# Patient Record
Sex: Male | Born: 1975
Health system: Southern US, Community
[De-identification: ages and names within clinical notes are randomized; demographics above are authoritative.]

## PROBLEM LIST (undated history)

## (undated) ENCOUNTER — Emergency Department (HOSPITAL_COMMUNITY): Admission: EM | Payer: Self-pay

## (undated) DIAGNOSIS — Z8672 Personal history of thrombophlebitis: Secondary | ICD-10-CM

## (undated) DIAGNOSIS — R112 Nausea with vomiting, unspecified: Secondary | ICD-10-CM

## (undated) DIAGNOSIS — K219 Gastro-esophageal reflux disease without esophagitis: Secondary | ICD-10-CM

## (undated) DIAGNOSIS — E669 Obesity, unspecified: Secondary | ICD-10-CM

## (undated) DIAGNOSIS — Z9884 Bariatric surgery status: Secondary | ICD-10-CM

## (undated) DIAGNOSIS — M7989 Other specified soft tissue disorders: Secondary | ICD-10-CM

## (undated) DIAGNOSIS — E7849 Other hyperlipidemia: Secondary | ICD-10-CM

## (undated) DIAGNOSIS — Z86718 Personal history of other venous thrombosis and embolism: Secondary | ICD-10-CM

## (undated) DIAGNOSIS — G4730 Sleep apnea, unspecified: Secondary | ICD-10-CM

## (undated) DIAGNOSIS — Z720 Tobacco use: Secondary | ICD-10-CM

## (undated) DIAGNOSIS — I10 Essential (primary) hypertension: Secondary | ICD-10-CM

## (undated) DIAGNOSIS — Z9889 Other specified postprocedural states: Secondary | ICD-10-CM

## (undated) DIAGNOSIS — Z86711 Personal history of pulmonary embolism: Secondary | ICD-10-CM

## (undated) HISTORY — PX: HIP SURGERY: SHX245

## (undated) HISTORY — PX: PELVIS CLOSED REDUCTION: SHX1023

## (undated) HISTORY — DX: Personal history of other venous thrombosis and embolism: Z86.718

## (undated) HISTORY — DX: Other hyperlipidemia: E78.49

## (undated) HISTORY — DX: Other specified soft tissue disorders: M79.89

---

## 1993-10-15 HISTORY — PX: FRACTURE SURGERY: SHX138

## 1999-10-07 ENCOUNTER — Encounter: Payer: Self-pay | Admitting: Emergency Medicine

## 1999-10-07 ENCOUNTER — Emergency Department (HOSPITAL_COMMUNITY): Admission: EM | Admit: 1999-10-07 | Discharge: 1999-10-07 | Payer: Self-pay | Admitting: Emergency Medicine

## 2000-03-12 ENCOUNTER — Emergency Department (HOSPITAL_COMMUNITY): Admission: EM | Admit: 2000-03-12 | Discharge: 2000-03-12 | Payer: Self-pay

## 2006-10-06 ENCOUNTER — Emergency Department (HOSPITAL_COMMUNITY): Admission: EM | Admit: 2006-10-06 | Discharge: 2006-10-07 | Payer: Self-pay | Admitting: Emergency Medicine

## 2007-02-12 ENCOUNTER — Emergency Department (HOSPITAL_COMMUNITY): Admission: EM | Admit: 2007-02-12 | Discharge: 2007-02-12 | Payer: Self-pay | Admitting: Emergency Medicine

## 2011-12-20 ENCOUNTER — Observation Stay (HOSPITAL_COMMUNITY)
Admission: EM | Admit: 2011-12-20 | Discharge: 2011-12-21 | Disposition: A | Discharge status: Home Health | Payer: Self-pay | Attending: Emergency Medicine | Admitting: Emergency Medicine

## 2011-12-20 ENCOUNTER — Encounter (HOSPITAL_COMMUNITY): Payer: Self-pay | Admitting: Emergency Medicine

## 2011-12-20 DIAGNOSIS — E669 Obesity, unspecified: Secondary | ICD-10-CM | POA: Insufficient documentation

## 2011-12-20 DIAGNOSIS — L02419 Cutaneous abscess of limb, unspecified: Principal | ICD-10-CM | POA: Insufficient documentation

## 2011-12-20 DIAGNOSIS — L03119 Cellulitis of unspecified part of limb: Secondary | ICD-10-CM | POA: Insufficient documentation

## 2011-12-20 DIAGNOSIS — I10 Essential (primary) hypertension: Secondary | ICD-10-CM | POA: Insufficient documentation

## 2011-12-20 DIAGNOSIS — M7989 Other specified soft tissue disorders: Secondary | ICD-10-CM | POA: Insufficient documentation

## 2011-12-20 DIAGNOSIS — F17200 Nicotine dependence, unspecified, uncomplicated: Secondary | ICD-10-CM | POA: Insufficient documentation

## 2011-12-20 DIAGNOSIS — L0390 Cellulitis, unspecified: Secondary | ICD-10-CM

## 2011-12-20 HISTORY — DX: Essential (primary) hypertension: I10

## 2011-12-20 HISTORY — DX: Obesity, unspecified: E66.9

## 2011-12-20 LAB — CBC
HCT: 42.4 % (ref 39.0–52.0)
Hemoglobin: 15 g/dL (ref 13.0–17.0)
MCH: 30.2 pg (ref 26.0–34.0)
MCHC: 35.4 g/dL (ref 30.0–36.0)
MCV: 85.3 fL (ref 78.0–100.0)
Platelets: 274 10*3/uL (ref 150–400)
RBC: 4.97 MIL/uL (ref 4.22–5.81)
RDW: 13.6 % (ref 11.5–15.5)
WBC: 25.6 10*3/uL — ABNORMAL HIGH (ref 4.0–10.5)

## 2011-12-20 LAB — DIFFERENTIAL
Basophils Absolute: 0 10*3/uL (ref 0.0–0.1)
Basophils Relative: 0 % (ref 0–1)
Eosinophils Absolute: 0 10*3/uL (ref 0.0–0.7)
Eosinophils Relative: 0 % (ref 0–5)
Lymphocytes Relative: 13 % (ref 12–46)
Lymphs Abs: 3.3 10*3/uL (ref 0.7–4.0)
Monocytes Absolute: 1.3 10*3/uL — ABNORMAL HIGH (ref 0.1–1.0)
Monocytes Relative: 5 % (ref 3–12)
Neutro Abs: 21 10*3/uL — ABNORMAL HIGH (ref 1.7–7.7)
Neutrophils Relative %: 82 % — ABNORMAL HIGH (ref 43–77)
Smear Review: ADEQUATE

## 2011-12-20 LAB — BASIC METABOLIC PANEL
BUN: 12 mg/dL (ref 6–23)
CO2: 22 mEq/L (ref 19–32)
Calcium: 10.1 mg/dL (ref 8.4–10.5)
Chloride: 103 mEq/L (ref 96–112)
Creatinine, Ser: 0.81 mg/dL (ref 0.50–1.35)
GFR calc Af Amer: >90 mL/min (ref >90)
GFR calc non Af Amer: >90 mL/min (ref >90)
Glucose, Bld: 101 mg/dL — ABNORMAL HIGH (ref 70–99)
Potassium: 4.1 mEq/L (ref 3.5–5.1)
Sodium: 135 mEq/L (ref 135–145)

## 2011-12-20 NOTE — ED Notes (Signed)
PT. REPORTS WOKE UP THIS MORNING WITH PROGRESSING LEFT LOWER LEG REDDNESS WITH SWELLING , STATES LOW GRADE FEVER / CHILLS, LEFT HEEL WOUND FOR 1 1/2 WEEKS.

## 2011-12-21 MED ORDER — SODIUM CHLORIDE 0.9 % IV SOLN
1000.0000 mL | INTRAVENOUS | Status: DC
  Administered 2011-12-21 (×3): 1000 mL via INTRAVENOUS

## 2011-12-21 MED ORDER — OXYCODONE-ACETAMINOPHEN 5-325 MG PO TABS
1.0000 | ORAL_TABLET | Freq: Four times a day (QID) | ORAL | Status: AC | PRN
Start: 2011-12-21 — End: 2011-12-31

## 2011-12-21 MED ORDER — MORPHINE SULFATE 4 MG/ML IJ SOLN
4.0000 mg | INTRAMUSCULAR | Status: DC | PRN
  Administered 2011-12-21 (×2): 4 mg via INTRAVENOUS
  Filled 2011-12-21 (×2): qty 1

## 2011-12-21 MED ORDER — ACETAMINOPHEN 325 MG PO TABS: 650.0000 mg | ORAL_TABLET | ORAL | Status: DC | PRN

## 2011-12-21 MED ORDER — CLINDAMYCIN HCL 150 MG PO CAPS: 450.0000 mg | ORAL_CAPSULE | Freq: Three times a day (TID) | ORAL | Status: AC

## 2011-12-21 MED ORDER — CLINDAMYCIN PHOSPHATE 600 MG/50ML IV SOLN
600.0000 mg | Freq: Four times a day (QID) | INTRAVENOUS | Status: DC
  Administered 2011-12-21 (×2): 600 mg via INTRAVENOUS
  Filled 2011-12-21 (×2): qty 50

## 2011-12-21 MED ORDER — CLINDAMYCIN PHOSPHATE 600 MG/50ML IV SOLN
600.0000 mg | Freq: Once | INTRAVENOUS | Status: AC
  Administered 2011-12-21: 600 mg via INTRAVENOUS
  Filled 2011-12-21: qty 50

## 2011-12-21 MED ORDER — ONDANSETRON HCL 4 MG/2ML IJ SOLN
4.0000 mg | Freq: Four times a day (QID) | INTRAMUSCULAR | Status: DC | PRN
  Administered 2011-12-21 (×2): 4 mg via INTRAVENOUS
  Filled 2011-12-21 (×2): qty 2

## 2011-12-21 NOTE — ED Provider Notes (Signed)
7:30 AM Reassessed patient.  Patient is on CDU protocol for Cellulitis.  Patient has been given two rounds of IV Clindamycin.  Patient reports that his pain is somewhat better.  The area of cellulitis had been marked with a skin marking pen prior to IV antibiotics.  The area of cellulitis has not extended beyond the marked area.  Patient is currently afebrile.  VSS.  Patient is requesting to be discharged.  Since the patient is afebrile, area of cellulitis is not worsening, and pain is controlled;  feel that patient can be discharged home with po antibiotics and recheck in 48 hours.  Patient is in agreement with the plan.  Pascal Lux [place], PA-C 12/21/11 1758

## 2011-12-21 NOTE — ED Notes (Signed)
Diet tray ordered 

## 2011-12-21 NOTE — ED Notes (Signed)
Left lower leg red and warm to touch.  Left heel with wound for the last 1 1/2  Weeks  No drainage noted from either area.

## 2011-12-21 NOTE — ED Provider Notes (Signed)
History     CSN: [id number]  Arrival date & time 12/20/11  2228   First MD Initiated Contact with Patient 12/21/11 [phone]      Chief Complaint  Patient presents with  . Leg Swelling    (Consider location/radiation/quality/duration/timing/severity/associated sxs/prior treatment) HPI Left calf pain swelling and redness. Started yesterday as a small area posterior calf. Over the last few days patient has had a worsening open wound on his left heel that he attributes to dry skin and a shoe rubbing the area. Earlier today he had a temperature of 102 with chills. No nausea vomiting or diarrhea. No abdominal pain. No headaches or sore throat. No sick contacts at home. Symptoms moderate in severity. Left calf and leg pain worse with palpation and walking and feels better with rest. Patient denies any history of diabetes or cellulitis in the past. No chest pain or shortness of breath or history of DVT.  Past Medical History  Diagnosis Date  . Hypertension   . Obesity     Past Surgical History  Procedure Date  . Hip surgery     No family history on file.  History  Substance Use Topics  . Smoking status: Current Everyday Smoker  . Smokeless tobacco: Not on file  . Alcohol Use: Yes      Review of Systems  Constitutional: Negative for fever and chills.  HENT: Negative for neck pain and neck stiffness.   Eyes: Negative for pain.  Respiratory: Negative for shortness of breath.   Cardiovascular: Positive for leg swelling. Negative for chest pain and palpitations.  Gastrointestinal: Negative for abdominal pain.  Genitourinary: Negative for dysuria.  Musculoskeletal: Negative for back pain.  Skin: Positive for rash and wound.  Neurological: Negative for headaches.  All other systems reviewed and are negative.    Allergies  Review of patient's allergies indicates no known allergies.  Home Medications   Current Outpatient Rx  Name Route Sig Dispense Refill  . ACETAMINOPHEN 325  MG PO TABS Oral Take 650 mg by mouth every 6 (six) hours as needed. For pain/fever    . BENAZEPRIL HCL 20 MG PO TABS Oral Take 20 mg by mouth daily.      BP 111/68  Pulse 91  Temp(Src) 98.8 F (37.1 C) (Oral)  Resp 18  SpO2 95%  Physical Exam  Constitutional: He is oriented to person, place, and time. He appears well-developed and well-nourished.  HENT:  Head: Normocephalic and atraumatic.  Eyes: Conjunctivae and EOM are normal. Pupils are equal, round, and reactive to light.  Neck: Trachea normal. Neck supple. No thyromegaly present.  Cardiovascular: Normal rate, regular rhythm, S1 normal, S2 normal and normal pulses.     No systolic murmur is present   No diastolic murmur is present  Pulses:      Radial pulses are 2+ on the right side, and 2+ on the left side.  Pulmonary/Chest: Effort normal and breath sounds normal. He has no wheezes. He has no rhonchi. He has no rales. He exhibits no tenderness.  Abdominal: Soft. Normal appearance and bowel sounds are normal. There is no tenderness. There is no CVA tenderness and negative Murphy's sign.  Musculoskeletal:       Left lower extremity: Tenderness, erythema and swelling to left lateral and posterior calf. There is a healing wound or the left heel without fluctuance or active drainage. Distal neurovascular is intact. There are no cords negative Homans sign. Erythema edges outlined in ink.  Neurological: He is alert and  oriented to person, place, and time. He has normal strength. No cranial nerve deficit or sensory deficit. GCS eye subscore is 4. GCS verbal subscore is 5. GCS motor subscore is 6.  Skin: Skin is warm and dry. No rash noted. He is not diaphoretic.  Psychiatric: His speech is normal.       Cooperative and appropriate    ED Course  Procedures (including critical care time)  Results for orders placed during the hospital encounter of 12/20/11  CBC      Component Value Range   WBC 25.6 (*) 4.0 - 10.5 (K/uL)   RBC 4.97   4.22 - 5.81 (MIL/uL)   Hemoglobin 15.0  13.0 - 17.0 (g/dL)   HCT 13.0  86.5 - 78.4 (%)   MCV 85.3  78.0 - 100.0 (fL)   MCH 30.2  26.0 - 34.0 (pg)   MCHC 35.4  30.0 - 36.0 (g/dL)   RDW 69.6  29.5 - 28.4 (%)   Platelets 274  150 - 400 (K/uL)  DIFFERENTIAL      Component Value Range   Neutrophils Relative 82 (*) 43 - 77 (%)   Lymphocytes Relative 13  12 - 46 (%)   Monocytes Relative 5  3 - 12 (%)   Eosinophils Relative 0  0 - 5 (%)   Basophils Relative 0  0 - 1 (%)   Neutro Abs 21.0 (*) 1.7 - 7.7 (K/uL)   Lymphs Abs 3.3  0.7 - 4.0 (K/uL)   Monocytes Absolute 1.3 (*) 0.1 - 1.0 (K/uL)   Eosinophils Absolute 0.0  0.0 - 0.7 (K/uL)   Basophils Absolute 0.0  0.0 - 0.1 (K/uL)   Smear Review PLATELETS APPEAR ADEQUATE    BASIC METABOLIC PANEL      Component Value Range   Sodium 135  135 - 145 (mEq/L)   Potassium 4.1  3.5 - 5.1 (mEq/L)   Chloride 103  96 - 112 (mEq/L)   CO2 22  19 - 32 (mEq/L)   Glucose, Bld 101 (*) 70 - 99 (mg/dL)   BUN 12  6 - 23 (mg/dL)   Creatinine, Ser 1.32  0.50 - 1.35 (mg/dL)   Calcium 44.0  8.4 - 10.5 (mg/dL)   GFR calc non Af Amer >90  >90 (mL/min)   GFR calc Af Amer >90  >90 (mL/min)       MDM   Clinical cellulitis with fever elevated white blood cell count. Placed in CDU under observation status for IV antibiotics. Plan pain control and serial evaluations and recheck in a.m.        Sunnie Nielsen, MD 12/21/11 0110

## 2011-12-21 NOTE — Discharge Instructions (Signed)
Return to the Emergency Department if the area becomes worse and extends beyond the marked area.  Otherwise, return to the Emergency Department in 48 hours to have area rechecked. Take antibiotics as prescribed.  Take pain medication as prescribed.  Do not drive or operate heavy machinery while taking pain medication.

## 2011-12-22 NOTE — ED Provider Notes (Signed)
Medical screening examination/treatment/procedure(s) were performed by non-physician practitioner and as supervising physician I was immediately available for consultation/collaboration.   Gwyneth Sprout, MD 12/22/11 1550

## 2014-03-25 ENCOUNTER — Ambulatory Visit
Admission: RE | Admit: 2014-03-25 | Discharge: 2014-03-25 | Disposition: A | Discharge status: Home / Self Care | Payer: BC Managed Care – PPO | Source: Ambulatory Visit | Attending: Endocrinology | Admitting: Endocrinology

## 2014-03-25 ENCOUNTER — Other Ambulatory Visit: Payer: Self-pay | Admitting: Endocrinology

## 2014-03-25 DIAGNOSIS — G4489 Other headache syndrome: Secondary | ICD-10-CM

## 2014-07-18 ENCOUNTER — Ambulatory Visit (INDEPENDENT_AMBULATORY_CARE_PROVIDER_SITE_OTHER): Payer: BC Managed Care – PPO | Admitting: Internal Medicine

## 2014-07-18 VITALS — BP 128/74 | HR 100 | Temp 97.8°F | Resp 18 | Ht 74.0 in | Wt >= 6400 oz

## 2014-07-18 DIAGNOSIS — J988 Other specified respiratory disorders: Secondary | ICD-10-CM

## 2014-07-18 DIAGNOSIS — Z6841 Body Mass Index (BMI) 40.0 and over, adult: Secondary | ICD-10-CM | POA: Insufficient documentation

## 2014-07-18 MED ORDER — HYDROCODONE-HOMATROPINE 5-1.5 MG/5ML PO SYRP: 5.0000 mL | ORAL_SOLUTION | Freq: Four times a day (QID) | ORAL | Status: DC | PRN

## 2014-07-18 MED ORDER — PREDNISONE 20 MG PO TABS: ORAL_TABLET | ORAL | Status: DC

## 2014-07-18 NOTE — Progress Notes (Signed)
Subjective:    Patient ID: Joseph E Hoge, male    DOB: 07-22-1976, 38 y.o.   MRN: [id number] This chart was scribed for Robert P. Merla Richesoolittle, MD by Chestine SporeSoijett Blue, ED Scribe. The patient was seen in room 9 at 10:45 AM.   Chief Complaint  Patient presents with  . Cough    HPI Joseph Lang is a 38 y.o. male with medical hx of HTN and obesity who presents today complaining of cough. He states that he went to his doctor and was Rx a Z-pack. He states that no OTC medications work for him, and that the only thing that works is medications with codeine in them. He states that he is having trouble sleeping at night. He states that he has had associated symptoms of fever with the max being 99.3. He states that he has tried Delsym, Mucinex, and Robitussin with no relief for his symptoms. He denies any other associated symptoms. He states that he gets similar symptoms once every three years, but no underlying asthma    There are no active problems to display for this patient.  Past Medical History  Diagnosis Date  . Hypertension   . Obesity    Past Surgical History  Procedure Laterality Date  . Hip surgery     No Known Allergies Prior to Admission medications   Medication Sig Start Date End Date Taking? Authorizing Provider  azithromycin (ZITHROMAX) 1 G powder Take 1 g by mouth once.   Yes Historical Provider, MD  benazepril (LOTENSIN) 20 MG tablet Take 20 mg by mouth daily.   Yes Historical Provider, MD  acetaminophen (TYLENOL) 325 MG tablet Take 650 mg by mouth every 6 (six) hours as needed. For pain/fever    Historical Provider, MD     Review of Systems  Constitutional: Positive for fever.  Respiratory: Positive for cough and wheezing.        Objective:   Physical Exam  Nursing note and vitals reviewed. Constitutional: He is oriented to person, place, and time. He appears well-developed and well-nourished. No distress.  HENT:  Head: Normocephalic and atraumatic.  Eyes: EOM are  normal.  Neck: Neck supple. No tracheal deviation present.  Cardiovascular: Normal rate and regular rhythm.   Pulmonary/Chest: Effort normal. No respiratory distress. He has wheezes (bilaterally with expiration).  Musculoskeletal: Normal range of motion.  Neurological: He is alert and oriented to person, place, and time.  Skin: Skin is warm and dry.  Psychiatric: He has a normal mood and affect. His behavior is normal.         BP 128/74  Pulse 100  Temp(Src) 97.8 F (36.6 C) (Oral)  Resp 18  Ht 6\' 2"  (1.88 m)  Wt 434 lb (196.861 kg)  BMI 55.70 kg/m2  SpO2 97%  Assessment & Plan:  COORDINATION OF CARE: 10:51 AM-Discussed treatment plan which includes Hycodan and Prednisone with pt at bedside and pt agreed to plan.    I personally performed the services described in this documentation, which was scribed in my presence. The recorded information has been reviewed and is accurate.   Wheezing-associated respiratory infection  Meds ordered this encounter  Medications  . predniSONE (DELTASONE) 20 MG tablet    Sig: 4/3/3/2/2/1/1 single daily dose for 7 days    Dispense:  16 tablet    Refill:  0  . HYDROcodone-homatropine (HYCODAN) 5-1.5 MG/5ML syrup    Sig: Take 5 mLs by mouth every 6 (six) hours as needed.    Dispense:  120 mL    Refill:  0

## 2014-09-01 ENCOUNTER — Ambulatory Visit
Admission: RE | Admit: 2014-09-01 | Discharge: 2014-09-01 | Disposition: A | Discharge status: Home / Self Care | Payer: BC Managed Care – PPO | Source: Ambulatory Visit | Attending: Internal Medicine | Admitting: Internal Medicine

## 2014-09-01 ENCOUNTER — Other Ambulatory Visit: Payer: Self-pay | Admitting: Internal Medicine

## 2014-09-01 DIAGNOSIS — R05 Cough: Secondary | ICD-10-CM

## 2014-09-01 DIAGNOSIS — R059 Cough, unspecified: Secondary | ICD-10-CM

## 2014-10-15 ENCOUNTER — Emergency Department: Payer: Self-pay | Admitting: Emergency Medicine

## 2014-10-15 LAB — TROPONIN I: Troponin-I: 0.05 ng/mL

## 2014-10-15 LAB — BASIC METABOLIC PANEL
Anion Gap: 5 — ABNORMAL LOW (ref 7–16)
BUN: 11 mg/dL (ref 7–18)
Calcium, Total: 9.2 mg/dL (ref 8.5–10.1)
Chloride: 111 mmol/L — ABNORMAL HIGH (ref 98–107)
Co2: 25 mmol/L (ref 21–32)
Creatinine: 0.7 mg/dL (ref 0.60–1.30)
EGFR (African American): >60
EGFR (Non-African Amer.): >60
Glucose: 123 mg/dL — ABNORMAL HIGH (ref 65–99)
Osmolality: 282 (ref 275–301)
Potassium: 4 mmol/L (ref 3.5–5.1)
Sodium: 141 mmol/L (ref 136–145)

## 2014-10-16 ENCOUNTER — Encounter (HOSPITAL_COMMUNITY): Payer: Self-pay | Admitting: Emergency Medicine

## 2014-10-16 ENCOUNTER — Emergency Department (HOSPITAL_COMMUNITY)
Admission: EM | Admit: 2014-10-16 | Discharge: 2014-10-16 | Disposition: A | Discharge status: Home / Self Care | Payer: BC Managed Care – PPO | Attending: Emergency Medicine | Admitting: Emergency Medicine

## 2014-10-16 ENCOUNTER — Emergency Department (HOSPITAL_COMMUNITY): Payer: BC Managed Care – PPO

## 2014-10-16 DIAGNOSIS — I10 Essential (primary) hypertension: Secondary | ICD-10-CM | POA: Insufficient documentation

## 2014-10-16 DIAGNOSIS — Z720 Tobacco use: Secondary | ICD-10-CM | POA: Diagnosis not present

## 2014-10-16 DIAGNOSIS — R630 Anorexia: Secondary | ICD-10-CM | POA: Diagnosis not present

## 2014-10-16 DIAGNOSIS — Z880 Allergy status to penicillin: Secondary | ICD-10-CM | POA: Insufficient documentation

## 2014-10-16 DIAGNOSIS — R42 Dizziness and giddiness: Secondary | ICD-10-CM | POA: Diagnosis present

## 2014-10-16 DIAGNOSIS — H8110 Benign paroxysmal vertigo, unspecified ear: Secondary | ICD-10-CM | POA: Insufficient documentation

## 2014-10-16 LAB — CBC
HCT: 43.3 % (ref 39.0–52.0)
Hemoglobin: 14.5 g/dL (ref 13.0–17.0)
MCH: 28.8 pg (ref 26.0–34.0)
MCHC: 33.5 g/dL (ref 30.0–36.0)
MCV: 85.9 fL (ref 78.0–100.0)
Platelets: 264 10*3/uL (ref 150–400)
RBC: 5.04 MIL/uL (ref 4.22–5.81)
RDW: 14 % (ref 11.5–15.5)
WBC: 12.4 10*3/uL — ABNORMAL HIGH (ref 4.0–10.5)

## 2014-10-16 LAB — BASIC METABOLIC PANEL
Anion gap: 6 (ref 5–15)
BUN: 7 mg/dL (ref 6–23)
CO2: 25 mmol/L (ref 19–32)
Calcium: 10.1 mg/dL (ref 8.4–10.5)
Chloride: 109 mEq/L (ref 96–112)
Creatinine, Ser: 0.7 mg/dL (ref 0.50–1.35)
GFR calc Af Amer: >90 mL/min (ref >90)
GFR calc non Af Amer: >90 mL/min (ref >90)
Glucose, Bld: 108 mg/dL — ABNORMAL HIGH (ref 70–99)
Potassium: 3.9 mmol/L (ref 3.5–5.1)
Sodium: 140 mmol/L (ref 135–145)

## 2014-10-16 LAB — I-STAT TROPONIN, ED: Troponin i, poc: 0.01 ng/mL (ref 0.00–0.08)

## 2014-10-16 MED ORDER — MECLIZINE HCL 25 MG PO TABS
25.0000 mg | ORAL_TABLET | Freq: Once | ORAL | Status: AC
  Administered 2014-10-16: 25 mg via ORAL
  Filled 2014-10-16: qty 1

## 2014-10-16 MED ORDER — MECLIZINE HCL 25 MG PO TABS: 25.0000 mg | ORAL_TABLET | Freq: Three times a day (TID) | ORAL | Status: DC | PRN

## 2014-10-16 NOTE — ED Notes (Signed)
Pt made aware to return if symptoms worsen or if any life threatening symptoms occur.   

## 2014-10-16 NOTE — ED Notes (Signed)
Patient with dizziness since yesterday morning.  Patient describes it as a "spinning" sensation.  Patient was seen at [facility] yesterday for the same.  Patient is CAOx3, no facial droop, no slurred speech.  Patient with equal hand grips and pedal pushes.

## 2014-10-16 NOTE — Discharge Instructions (Signed)
If you were given medicines take as directed.  If you are on coumadin or contraceptives realize their levels and effectiveness is altered by many different medicines.  If you have any reaction (rash, tongues swelling, other) to the medicines stop taking and see a physician.   Have blood pressure rechecked this week.  Please follow up as directed and return to the ER or see a physician for new or worsening symptoms.  Thank you. Filed Vitals:   10/16/14 1935  BP: 177/82  Pulse: 74  Temp: 97.8 F (36.6 C)  TempSrc: Oral  Resp: 18  SpO2: 97%

## 2014-10-16 NOTE — ED Provider Notes (Signed)
CSN: [id number]     Arrival date & time 10/16/14  1925 History   First MD Initiated Contact with Patient 10/16/14 2118     Chief Complaint  Patient presents with  . Dizziness     (Consider location/radiation/quality/duration/timing/severity/associated sxs/prior Treatment) HPI Comments: 39 year old male with high blood pressure history presents with intermittent vertigo since New Year's. No history of similar. It only occurs when he rolls over or moves his head in specific positions. No stroke history, no balance issues, no vision loss or double vision. Symptoms improved with position and time.Clois Dupes   Patient is a 39 y.o. male presenting with dizziness. The history is provided by the patient.  Dizziness Associated symptoms: no chest pain, no headaches, no shortness of breath and no vomiting     Past Medical History  Diagnosis Date  . Hypertension    Past Surgical History  Procedure Laterality Date  . Pelvis closed reduction     History reviewed. No pertinent family history. History  Substance Use Topics  . Smoking status: Current Every Day Smoker    Types: Cigarettes  . Smokeless tobacco: Not on file  . Alcohol Use: Yes     Comment: occ    Review of Systems  Constitutional: Positive for appetite change. Negative for fever and chills.  HENT: Negative for congestion.   Eyes: Negative for visual disturbance.  Respiratory: Negative for shortness of breath.   Cardiovascular: Negative for chest pain.  Gastrointestinal: Negative for vomiting and abdominal pain.  Genitourinary: Negative for dysuria and flank pain.  Musculoskeletal: Negative for back pain, neck pain and neck stiffness.  Skin: Negative for rash.  Neurological: Positive for dizziness. Negative for light-headedness and headaches.      Allergies  Amoxicillin  Home Medications   Prior to Admission medications   Medication Sig Start Date End Date Taking? Authorizing Provider  meclizine (ANTIVERT) 25 MG  tablet Take 1 tablet (25 mg total) by mouth 3 (three) times daily as needed for dizziness. 10/16/14   Enid Skeens, MD   BP 150/80 mmHg  Pulse 74  Temp(Src) 97.8 F (36.6 C) (Oral)  Resp 14  SpO2 96% Physical Exam  Constitutional: He is oriented to person, place, and time. He appears well-developed and well-nourished.  HENT:  Head: Normocephalic and atraumatic.  Eyes: Conjunctivae are normal. Right eye exhibits no discharge. Left eye exhibits no discharge.  Neck: Normal range of motion. Neck supple. No tracheal deviation present.  Cardiovascular: Normal rate.   Pulmonary/Chest: Effort normal.  Abdominal: Soft.  Musculoskeletal: He exhibits no edema.  Neurological: He is alert and oriented to person, place, and time. Coordination and gait normal.  5+ strength in UE and LE with f/e at major joints. Sensation to palpation intact in UE and LE. CNs 2-12 grossly intact.  EOMFI.  PERRL.   Finger nose and coordination intact bilateral.   Visual fields intact to finger testing.   Skin: Skin is warm. No rash noted.  Psychiatric: He has a normal mood and affect.  Nursing note and vitals reviewed.   ED Course  Procedures (including critical care time) Epley maneuver Indication concern for peripheral vertigo Discussed risks and benefits. Patient was lied flat with head turned 45 to the left for 30 seconds followed by turning head to the right 45 for 30 seconds followed by turning the patients body on his right side for 30 seconds followed by sitting up for 30 seconds. His symptoms are reproduced. Mild lightheaded after, no difficulties. Labs Review Labs  Reviewed  CBC - Abnormal; Notable for the following:    WBC 12.4 (*)    All other components within normal limits  BASIC METABOLIC PANEL - Abnormal; Notable for the following:    Glucose, Bld 108 (*)    All other components within normal limits  Rosezena Sensor, ED    Imaging Review Dg Chest 2 View  10/16/2014   CLINICAL DATA:   Initial encounter for dizziness.  EXAM: CHEST  2 VIEW  COMPARISON:  None.  FINDINGS: The heart size and mediastinal contours are within normal limits. Both lungs are clear. The visualized skeletal structures are unremarkable.  IMPRESSION: No active cardiopulmonary disease.   Electronically Signed   By: Kennith Center M.D.   On: 10/16/2014 20:19     EKG Interpretation None      MDM   Final diagnoses:  BPPV (benign paroxysmal positional vertigo), unspecified laterality    Patient presents with clinically benign positional vertigo, patient has a normal neurologic exam in the ER. Epley maneuver was performed and reproduce symptoms when head turned to the right, mild nystagmus. Patient symptoms improved, meclizine given. Discussed trying Epley maneuver at home if symptoms worsen and outpatient follow-up.  Results and differential diagnosis were discussed with the patient/parent/guardian. Close follow up outpatient was discussed, comfortable with the plan.   Medications  meclizine (ANTIVERT) tablet 25 mg (25 mg Oral Given 10/16/14 2242)    Filed Vitals:   10/16/14 1935 10/16/14 2130 10/16/14 2145 10/16/14 2244  BP: 177/82 131/73 133/61 150/80  Pulse: 74 72 82 74  Temp: 97.8 F (36.6 C)     TempSrc: Oral     Resp: SpO2: 97% 96% 98% 96%    Final diagnoses:  BPPV (benign paroxysmal positional vertigo), unspecified laterality        Enid Skeens, MD 10/16/14 2250

## 2014-10-16 NOTE — ED Notes (Signed)
Pt reports dizziness since yesterday morning upon standing when waking up.  When pt laid back down, the room started spinning.  Pt reports dizziness at this time.  Pt believes he has vertigo.  Pt denies n/v/d or h/a.

## 2014-11-19 ENCOUNTER — Other Ambulatory Visit (INDEPENDENT_AMBULATORY_CARE_PROVIDER_SITE_OTHER): Payer: Self-pay | Admitting: Surgery

## 2014-11-19 DIAGNOSIS — Z6841 Body Mass Index (BMI) 40.0 and over, adult: Secondary | ICD-10-CM

## 2014-11-23 ENCOUNTER — Other Ambulatory Visit (INDEPENDENT_AMBULATORY_CARE_PROVIDER_SITE_OTHER): Payer: Self-pay

## 2014-11-23 DIAGNOSIS — Z01818 Encounter for other preprocedural examination: Secondary | ICD-10-CM

## 2014-12-17 ENCOUNTER — Ambulatory Visit (HOSPITAL_COMMUNITY)
Admission: RE | Admit: 2014-12-17 | Discharge: 2014-12-17 | Disposition: A | Discharge status: Home / Self Care | Payer: BLUE CROSS/BLUE SHIELD | Source: Ambulatory Visit | Attending: Surgery | Admitting: Surgery

## 2014-12-17 ENCOUNTER — Ambulatory Visit (HOSPITAL_COMMUNITY): Admission: RE | Admit: 2014-12-17 | Payer: BLUE CROSS/BLUE SHIELD | Source: Ambulatory Visit

## 2014-12-17 DIAGNOSIS — Z01818 Encounter for other preprocedural examination: Secondary | ICD-10-CM | POA: Diagnosis present

## 2014-12-29 ENCOUNTER — Encounter: Payer: Self-pay | Admitting: Dietician

## 2014-12-29 ENCOUNTER — Encounter: Payer: BLUE CROSS/BLUE SHIELD | Attending: Surgery | Admitting: Dietician

## 2014-12-29 VITALS — Ht 74.0 in | Wt >= 6400 oz

## 2014-12-29 DIAGNOSIS — Z6841 Body Mass Index (BMI) 40.0 and over, adult: Secondary | ICD-10-CM | POA: Diagnosis not present

## 2014-12-29 DIAGNOSIS — Z713 Dietary counseling and surveillance: Secondary | ICD-10-CM | POA: Insufficient documentation

## 2014-12-29 NOTE — Patient Instructions (Signed)

## 2014-12-29 NOTE — Progress Notes (Signed)
  Pre-Op Assessment Visit:  Pre-Operative Sleeve Gastrectomy Surgery  Medical Nutrition Therapy:  Appt start time: 1615   End time:  1700.  Patient was seen on 12/29/2014 for Pre-Operative Nutrition Assessment. [facility] on 12/29/2014.   Preferred Learning Style:   No preference indicated   Learning Readiness:   Ready  Handouts given during visit include:  Pre-Op Goals Bariatric Surgery Protein Shakes Pre-Op Diet   During the appointment today the following Pre-Op Goals were reviewed with the patient: Maintain or lose weight as instructed by your surgeon Make healthy food choices Begin to limit portion sizes Limited concentrated sugars and fried foods Keep fat/sugar in the single digits per serving on   food labels Practice CHEWING your food  (aim for 30 chews per bite or until applesauce consistency) Practice not drinking 15 minutes before, during, and 30 minutes after each meal/snack Avoid all carbonated beverages  Avoid/limit caffeinated beverages  Avoid all sugar-sweetened beverages Consume 3 meals per day; eat every 3-5 hours Make a list of non-food related activities Aim for 64-100 ounces of FLUID daily  Aim for at least 60-80 grams of PROTEIN daily Look for a liquid protein source that contain ?15 g protein and ?5 g carbohydrate  (ex: shakes, drinks, shots)  Patient-Centered Goals: He would like to avoid diabetes and improve his health. 10 level confidence/10 level importance   Demonstrated degree of understanding via:  Teach Back  Teaching Method Utilized:  Visual Auditory Hands on  Barriers to learning/adherence to lifestyle change: limited income  Patient to call the [facility] to enroll in Pre-Op and Post-Op Nutrition Education when surgery date is scheduled.

## 2015-01-24 ENCOUNTER — Encounter: Payer: BLUE CROSS/BLUE SHIELD | Attending: Surgery

## 2015-01-24 DIAGNOSIS — Z713 Dietary counseling and surveillance: Secondary | ICD-10-CM | POA: Diagnosis not present

## 2015-01-24 DIAGNOSIS — Z6841 Body Mass Index (BMI) 40.0 and over, adult: Secondary | ICD-10-CM | POA: Insufficient documentation

## 2015-01-24 NOTE — Progress Notes (Signed)
  Pre-Operative Nutrition Class:  Appt start time: 9412   End time:  1830.  Patient was seen on 01/24/15  for Pre-Operative Bariatric Surgery Education at the [facility].   Surgery date: 02/14/15 Surgery type: Gastric sleeve Start weight at [facility]: 447 lbs on 12/29/14 Weight today: 449 lbs  TANITA  BODY COMP RESULTS  01/24/15   BMI (kg/m^2) 57.6   Fat Mass (lbs) 188   Fat Free Mass (lbs) 261   Total Body Water (lbs) 191   Samples given per MNT protocol. Patient educated on appropriate usage: Premier protein shake (strawberry - qty 1) Lot #: [id number] Exp: 06/2015  Unjury protein powder (vanilla - qty 1) Lot #: [id number] Exp: 08/2015  PB2 (qty 1) Lot #: [id number] Exp: 05/2015  Bariatric Advantage Calcium citrate chewy bite (orange - qty 1) Lot#: [id number] Exp: 02/2015  The following the learning objectives were met by the patient during this course:  Identify Pre-Op Dietary Goals and will begin 2 weeks pre-operatively  Identify appropriate sources of fluids and proteins   State protein recommendations and appropriate sources pre and post-operatively  Identify Post-Operative Dietary Goals and will follow for 2 weeks post-operatively  Identify appropriate multivitamin and calcium sources  Describe the need for physical activity post-operatively and will follow MD recommendations  State when to call healthcare provider regarding medication questions or post-operative complications  Handouts given during class include:  Pre-Op Bariatric Surgery Diet Handout  Protein Shake Handout  Post-Op Bariatric Surgery Nutrition Handout  [facility] Program Information Flyer  Support Group Information Flyer  [facility] Bariatric Supplements Price List  Follow-Up Plan: Patient will follow-up at [facility] 2 weeks post operatively for diet advancement per MD.

## 2015-02-01 NOTE — Progress Notes (Signed)
Please put orders in Epic surgery 02-14-15 pre op 02-02-15 Thanks

## 2015-02-02 ENCOUNTER — Encounter (HOSPITAL_COMMUNITY)
Admission: RE | Admit: 2015-02-02 | Discharge: 2015-02-02 | Disposition: A | Discharge status: Home / Self Care | Payer: BLUE CROSS/BLUE SHIELD | Source: Ambulatory Visit | Attending: Surgery | Admitting: Surgery

## 2015-02-02 ENCOUNTER — Ambulatory Visit: Payer: Self-pay | Admitting: Surgery

## 2015-02-02 ENCOUNTER — Encounter (HOSPITAL_COMMUNITY): Payer: Self-pay

## 2015-02-02 ENCOUNTER — Other Ambulatory Visit (INDEPENDENT_AMBULATORY_CARE_PROVIDER_SITE_OTHER): Payer: Self-pay

## 2015-02-02 DIAGNOSIS — Z01812 Encounter for preprocedural laboratory examination: Secondary | ICD-10-CM | POA: Insufficient documentation

## 2015-02-02 HISTORY — DX: Sleep apnea, unspecified: G47.30

## 2015-02-02 LAB — CBC WITH DIFFERENTIAL/PLATELET
Basophils Absolute: 0 10*3/uL (ref 0.0–0.1)
Basophils Relative: 0 % (ref 0–1)
Eosinophils Absolute: 0.2 10*3/uL (ref 0.0–0.7)
Eosinophils Relative: 2 % (ref 0–5)
HCT: 45.9 % (ref 39.0–52.0)
Hemoglobin: 15.6 g/dL (ref 13.0–17.0)
Lymphocytes Relative: 29 % (ref 12–46)
Lymphs Abs: 3.1 10*3/uL (ref 0.7–4.0)
MCH: 29.2 pg (ref 26.0–34.0)
MCHC: 34 g/dL (ref 30.0–36.0)
MCV: 85.8 fL (ref 78.0–100.0)
Monocytes Absolute: 0.7 10*3/uL (ref 0.1–1.0)
Monocytes Relative: 6 % (ref 3–12)
Neutro Abs: 6.8 10*3/uL (ref 1.7–7.7)
Neutrophils Relative %: 63 % (ref 43–77)
Platelets: 250 10*3/uL (ref 150–400)
RBC: 5.35 MIL/uL (ref 4.22–5.81)
RDW: 13.2 % (ref 11.5–15.5)
WBC: 10.8 10*3/uL — ABNORMAL HIGH (ref 4.0–10.5)

## 2015-02-02 LAB — COMPREHENSIVE METABOLIC PANEL
ALT: 36 U/L (ref 0–53)
AST: 27 U/L (ref 0–37)
Albumin: 4.5 g/dL (ref 3.5–5.2)
Alkaline Phosphatase: 73 U/L (ref 39–117)
Anion gap: 6 (ref 5–15)
BUN: 15 mg/dL (ref 6–23)
CO2: 25 mmol/L (ref 19–32)
Calcium: 9.9 mg/dL (ref 8.4–10.5)
Chloride: 106 mmol/L (ref 96–112)
Creatinine, Ser: 0.82 mg/dL (ref 0.50–1.35)
GFR calc Af Amer: >90 mL/min (ref >90)
GFR calc non Af Amer: >90 mL/min (ref >90)
Glucose, Bld: 85 mg/dL (ref 70–99)
Potassium: 4.3 mmol/L (ref 3.5–5.1)
Sodium: 137 mmol/L (ref 135–145)
Total Bilirubin: 1.1 mg/dL (ref 0.3–1.2)
Total Protein: 7.8 g/dL (ref 6.0–8.3)

## 2015-02-02 NOTE — H&P (Signed)
Chief Complaint:  Morbid obesity BMI 57  History of Present Illness:  Joseph Lang is an 39 y.o. male who was seen in the office in February to discuss bariatric surgery.  We decided upon a sleeve gastrectomy and he was seen back in the office on April 20 for his preop appt.  All questions were answered regarding his upcoming sleeve gastrectomy and he is aware of the risks.  He had a severe pelvic fracture [facility] by Dr. Marisue IvanLarry Webb with a good outcome.  He is aware that he might have adhesions from this surgery.  On UGI he has a small hiatal hernia with reflux and ultrasound was negative.  Will proceed with lap sleeve gastrectomy.    Past Medical History  Diagnosis Date  . Obesity   . Hypertension     Past Surgical History  Procedure Laterality Date  . Hip surgery    . Pelvis closed reduction      Current Outpatient Prescriptions  Medication Sig Dispense Refill  . benazepril (LOTENSIN) 20 MG tablet Take 20 mg by mouth every morning.     Marland Kitchen. HYDROcodone-homatropine (HYCODAN) 5-1.5 MG/5ML syrup Take 5 mLs by mouth every 6 (six) hours as needed. (Patient not taking: Reported on 12/29/2014) 120 mL 0  . meclizine (ANTIVERT) 25 MG tablet Take 1 tablet (25 mg total) by mouth 3 (three) times daily as needed for dizziness. (Patient not taking: Reported on 12/29/2014) 20 tablet 0  . predniSONE (DELTASONE) 20 MG tablet 4/3/3/2/2/1/1 single daily dose for 7 days (Patient not taking: Reported on 12/29/2014) 16 tablet 0   No current facility-administered medications for this visit.   Amoxicillin No family history on file. Social History:   reports that he has been smoking Cigarettes.  He does not have any smokeless tobacco history on file. He reports that he drinks alcohol. He reports that he does not use illicit drugs.   REVIEW OF SYSTEMS : Negative except for see HPI   Physical Exam:   There were no vitals taken for this visit. There is no weight on file to calculate BMI.  Gen:  WDWN WM  NAD  Neurological: Alert and oriented to person, place, and time. Motor and sensory function is grossly intact  Head: Normocephalic and atraumatic.  Eyes: Conjunctivae are normal. Pupils are equal, round, and reactive to light. No scleral icterus.  Neck: Normal range of motion. Neck supple. No tracheal deviation or thyromegaly present.  Cardiovascular:  SR without murmurs or gallops.  No carotid bruits Breast:  Not examined Respiratory: Effort normal.  No respiratory distress. No chest wall tenderness. Breath sounds normal.  No wheezes, rales or rhonchi.  Abdomen:  Obese and nontender:  There is a contact allergy rash at his beltline that he says is fungus.  Will treat with topical GU:  Normal male Musculoskeletal: Normal range of motion. Extremities are nontender. No cyanosis, edema or clubbing noted Lymphadenopathy: No cervical, preauricular, postauricular or axillary adenopathy is present Skin: Skin is warm and dry. No rash noted. No diaphoresis. No erythema. No pallor. Pscyh: Normal mood and affect. Behavior is normal. Judgment and thought content normal.   LABORATORY RESULTS: No results found for this or any previous visit (from the past 48 hour(s)).   RADIOLOGY RESULTS: No results found.  Problem List: Patient Active Problem List   Diagnosis Date Noted  . BMI 50.0-59.9, adult 07/18/2014    Assessment & Plan: Morbid obesity for sleeve gastrectomy. Will assess hiatus for hiatal hernia.  Matt B. Daphine Deutscher, MD, [facility] [phone] beeper [phone]  02/02/2015 10:01 AM

## 2015-02-02 NOTE — Patient Instructions (Signed)
Joseph Lang  02/02/2015   Your procedure is scheduled on: Monday 02/14/15  [facility]  [facility] and follow signs to               Short Stay Center at 8:00 AM.  Call this number if you have problems the morning of surgery [phone]   Remember:  Do not eat food or drink liquids :After Midnight.    Take these medicines the morning of surgery with A SIP OF WATER: NONE   Bring CPAP tubing and mask day of surgery                               You may not have any metal on your body including hair pins and              piercings  Do not wear jewelry, make-up, lotions, powders or perfumes.             Do not wear nail polish.  Do not shave  48 hours prior to surgery.              Men may shave face and neck.   Do not bring valuables to the hospital. [facility] IS NOT             RESPONSIBLE   FOR VALUABLES.  Contacts, dentures or bridgework may not be worn into surgery.  Leave suitcase in the car. After surgery it may be brought to your room.     Patients discharged the day of surgery will not be allowed to drive home.  Name and phone number of your driver:  Special Instructions: N/A              Please read over the following fact sheets you were given: _____________________________________________________________________             [facility] - Preparing for [facility] Before surgery, you can play an important role.  Because skin is not sterile, your skin needs to be as free of germs as possible.  You can reduce the number of germs on your skin by washing with CHG (chlorahexidine gluconate) soap before surgery.  CHG is an antiseptic cleaner which kills germs and bonds with the skin to continue killing germs even after washing. Please DO NOT use if you have an allergy to CHG or antibacterial soaps.  If your skin becomes reddened/irritated stop using the CHG and inform your nurse when you arrive at Short Stay. Do not shave (including legs and  underarms) for at least 48 hours prior to the first CHG shower.  You may shave your face/neck. Please follow these instructions carefully:  1.  Shower with CHG Soap the night before surgery and the  morning of Surgery.  2.  If you choose to wash your hair, wash your hair first as usual with your  normal  shampoo.  3.  After you shampoo, rinse your hair and body thoroughly to remove the  shampoo.                           4.  Use CHG as you would any other liquid soap.  You can apply chg directly  to the skin and wash  Gently with a scrungie or clean washcloth.  5.  Apply the CHG Soap to your body ONLY FROM THE NECK DOWN.   Do not use on face/ open                           Wound or open sores. Avoid contact with eyes, ears mouth and genitals (private parts).                       Wash face,  Genitals (private parts) with your normal soap.             6.  Wash thoroughly, paying special attention to the area where your surgery  will be performed.  7.  Thoroughly rinse your body with warm water from the neck down.  8.  DO NOT shower/wash with your normal soap after using and rinsing off  the CHG Soap.                9.  Pat yourself dry with a clean towel.            10.  Wear clean pajamas.            11.  Place clean sheets on your bed the night of your first shower and do not  sleep with pets. Day of Surgery : Do not apply any lotions/deodorants the morning of surgery.  Please wear clean clothes to the hospital/surgery center.  FAILURE TO FOLLOW THESE INSTRUCTIONS MAY RESULT IN THE CANCELLATION OF YOUR SURGERY PATIENT SIGNATURE_________________________________  NURSE SIGNATURE__________________________________  ________________________________________________________________________

## 2015-02-14 ENCOUNTER — Inpatient Hospital Stay (HOSPITAL_COMMUNITY): Payer: BLUE CROSS/BLUE SHIELD | Admitting: Anesthesiology

## 2015-02-14 ENCOUNTER — Encounter (HOSPITAL_COMMUNITY): Payer: Self-pay | Admitting: *Deleted

## 2015-02-14 ENCOUNTER — Inpatient Hospital Stay (HOSPITAL_COMMUNITY)
Admission: RE | Admit: 2015-02-14 | Discharge: 2015-02-16 | DRG: 621 | Disposition: A | Discharge status: Home / Self Care | Payer: BLUE CROSS/BLUE SHIELD | Source: Ambulatory Visit | Attending: Surgery | Admitting: Surgery

## 2015-02-14 ENCOUNTER — Encounter (HOSPITAL_COMMUNITY)
Admission: RE | Disposition: A | Discharge status: Home / Self Care | Payer: Self-pay | Source: Ambulatory Visit | Attending: Surgery

## 2015-02-14 DIAGNOSIS — I10 Essential (primary) hypertension: Secondary | ICD-10-CM | POA: Diagnosis present

## 2015-02-14 DIAGNOSIS — Z6841 Body Mass Index (BMI) 40.0 and over, adult: Secondary | ICD-10-CM

## 2015-02-14 DIAGNOSIS — Z7952 Long term (current) use of systemic steroids: Secondary | ICD-10-CM | POA: Diagnosis not present

## 2015-02-14 DIAGNOSIS — F17210 Nicotine dependence, cigarettes, uncomplicated: Secondary | ICD-10-CM | POA: Diagnosis present

## 2015-02-14 DIAGNOSIS — Z9884 Bariatric surgery status: Secondary | ICD-10-CM

## 2015-02-14 HISTORY — PX: LAPAROSCOPIC GASTRIC SLEEVE RESECTION: SHX5895

## 2015-02-14 LAB — CBC
HCT: 44.3 % (ref 39.0–52.0)
HCT: 40.1 % (ref 39.0–52.0)
Hemoglobin: 15.3 g/dL (ref 13.0–17.0)
Hemoglobin: 13.7 g/dL (ref 13.0–17.0)
MCH: 29.5 pg (ref 26.0–34.0)
MCH: 29.2 pg (ref 26.0–34.0)
MCHC: 34.5 g/dL (ref 30.0–36.0)
MCHC: 34.2 g/dL (ref 30.0–36.0)
MCV: 85.4 fL (ref 78.0–100.0)
MCV: 85.5 fL (ref 78.0–100.0)
Platelets: 217 10*3/uL (ref 150–400)
Platelets: 249 10*3/uL (ref 150–400)
RBC: 5.19 MIL/uL (ref 4.22–5.81)
RBC: 4.69 MIL/uL (ref 4.22–5.81)
RDW: 13.4 % (ref 11.5–15.5)
RDW: 13.1 % (ref 11.5–15.5)
WBC: 15.2 10*3/uL — ABNORMAL HIGH (ref 4.0–10.5)
WBC: 19.7 10*3/uL — ABNORMAL HIGH (ref 4.0–10.5)

## 2015-02-14 LAB — CREATININE, SERUM
Creatinine, Ser: 0.85 mg/dL (ref 0.61–1.24)
GFR calc Af Amer: >60 mL/min (ref >60)
GFR calc non Af Amer: >60 mL/min (ref >60)

## 2015-02-14 LAB — TYPE AND SCREEN
ABO/RH(D): A POS
Antibody Screen: NEGATIVE

## 2015-02-14 SURGERY — GASTRECTOMY, SLEEVE, LAPAROSCOPIC
Anesthesia: General | Site: Abdomen

## 2015-02-14 MED ORDER — SODIUM CHLORIDE 0.9 % IJ SOLN
INTRAMUSCULAR | Status: AC
  Filled 2015-02-14: qty 10

## 2015-02-14 MED ORDER — BUPIVACAINE LIPOSOME 1.3 % IJ SUSP
20.0000 mL | Freq: Once | INTRAMUSCULAR | Status: AC
  Administered 2015-02-14: 13 mL
  Filled 2015-02-14: qty 20

## 2015-02-14 MED ORDER — PROMETHAZINE HCL 25 MG/ML IJ SOLN
6.2500 mg | INTRAMUSCULAR | Status: AC | PRN
  Administered 2015-02-14 (×2): 6.25 mg via INTRAVENOUS

## 2015-02-14 MED ORDER — KCL IN DEXTROSE-NACL 20-5-0.45 MEQ/L-%-% IV SOLN
INTRAVENOUS | Status: DC
  Administered 2015-02-14: 16:00:00 via INTRAVENOUS
  Administered 2015-02-15: 1000 mL via INTRAVENOUS
  Administered 2015-02-15: 12:00:00 via INTRAVENOUS
  Filled 2015-02-14 (×7): qty 1000

## 2015-02-14 MED ORDER — PROMETHAZINE HCL 25 MG/ML IJ SOLN
INTRAMUSCULAR | Status: AC
  Filled 2015-02-14: qty 1

## 2015-02-14 MED ORDER — ONDANSETRON HCL 4 MG/2ML IJ SOLN
INTRAMUSCULAR | Status: DC | PRN
  Administered 2015-02-14: 4 mg via INTRAVENOUS

## 2015-02-14 MED ORDER — CHLORHEXIDINE GLUCONATE CLOTH 2 % EX PADS: 6.0000 | MEDICATED_PAD | Freq: Once | CUTANEOUS | Status: DC

## 2015-02-14 MED ORDER — PROMETHAZINE HCL 25 MG/ML IJ SOLN
12.5000 mg | INTRAMUSCULAR | Status: DC | PRN
  Administered 2015-02-14 – 2015-02-16 (×5): 12.5 mg via INTRAVENOUS
  Filled 2015-02-14 (×5): qty 1

## 2015-02-14 MED ORDER — HYDROMORPHONE HCL 1 MG/ML IJ SOLN: 0.2500 mg | INTRAMUSCULAR | Status: DC | PRN

## 2015-02-14 MED ORDER — NEOSTIGMINE METHYLSULFATE 10 MG/10ML IV SOLN
INTRAVENOUS | Status: DC | PRN
  Administered 2015-02-14: 5 mg via INTRAVENOUS

## 2015-02-14 MED ORDER — 0.9 % SODIUM CHLORIDE (POUR BTL) OPTIME
TOPICAL | Status: DC | PRN
  Administered 2015-02-14: 1000 mL

## 2015-02-14 MED ORDER — PROPOFOL 10 MG/ML IV BOLUS
INTRAVENOUS | Status: DC | PRN
  Administered 2015-02-14: 250 mg via INTRAVENOUS

## 2015-02-14 MED ORDER — ONDANSETRON HCL 4 MG/2ML IJ SOLN: 4.0000 mg | Freq: Once | INTRAMUSCULAR | Status: DC

## 2015-02-14 MED ORDER — PROPOFOL 10 MG/ML IV BOLUS
INTRAVENOUS | Status: AC
  Filled 2015-02-14: qty 20

## 2015-02-14 MED ORDER — ONDANSETRON HCL 4 MG/2ML IJ SOLN
4.0000 mg | INTRAMUSCULAR | Status: DC | PRN
  Administered 2015-02-14 – 2015-02-15 (×4): 4 mg via INTRAVENOUS
  Filled 2015-02-14 (×7): qty 2

## 2015-02-14 MED ORDER — DEXAMETHASONE SODIUM PHOSPHATE 10 MG/ML IJ SOLN
INTRAMUSCULAR | Status: AC
  Filled 2015-02-14: qty 1

## 2015-02-14 MED ORDER — ONDANSETRON HCL 4 MG/2ML IJ SOLN
4.0000 mg | Freq: Once | INTRAMUSCULAR | Status: AC | PRN
  Administered 2015-02-14: 4 mg via INTRAVENOUS

## 2015-02-14 MED ORDER — UNJURY VANILLA POWDER: 2.0000 [oz_av] | Freq: Four times a day (QID) | ORAL | Status: DC

## 2015-02-14 MED ORDER — MORPHINE SULFATE 2 MG/ML IJ SOLN
2.0000 mg | INTRAMUSCULAR | Status: DC | PRN
  Administered 2015-02-14 – 2015-02-15 (×6): 4 mg via INTRAVENOUS
  Administered 2015-02-15 (×3): 2 mg via INTRAVENOUS
  Filled 2015-02-14 (×3): qty 2
  Filled 2015-02-14 (×2): qty 1
  Filled 2015-02-14 (×3): qty 2
  Filled 2015-02-14: qty 1

## 2015-02-14 MED ORDER — DEXTROSE 5 % IV SOLN
INTRAVENOUS | Status: AC
  Filled 2015-02-14: qty 2

## 2015-02-14 MED ORDER — SUCCINYLCHOLINE CHLORIDE 20 MG/ML IJ SOLN
INTRAMUSCULAR | Status: DC | PRN
  Administered 2015-02-14: 180 mg via INTRAVENOUS

## 2015-02-14 MED ORDER — HEPARIN SODIUM (PORCINE) 5000 UNIT/ML IJ SOLN
5000.0000 [IU] | INTRAMUSCULAR | Status: AC
  Administered 2015-02-14: 5000 [IU] via SUBCUTANEOUS
  Filled 2015-02-14: qty 1

## 2015-02-14 MED ORDER — LACTATED RINGERS IV SOLN
INTRAVENOUS | Status: DC
  Administered 2015-02-14: 12:00:00 via INTRAVENOUS
  Administered 2015-02-14: 1000 mL via INTRAVENOUS

## 2015-02-14 MED ORDER — LIDOCAINE HCL (CARDIAC) 20 MG/ML IV SOLN
INTRAVENOUS | Status: AC
  Filled 2015-02-14: qty 5

## 2015-02-14 MED ORDER — MIDAZOLAM HCL 2 MG/2ML IJ SOLN
INTRAMUSCULAR | Status: AC
  Filled 2015-02-14: qty 2

## 2015-02-14 MED ORDER — HEPARIN SODIUM (PORCINE) 5000 UNIT/ML IJ SOLN: 5000.0000 [IU] | Freq: Three times a day (TID) | INTRAMUSCULAR | Status: DC

## 2015-02-14 MED ORDER — CISATRACURIUM BESYLATE 20 MG/10ML IV SOLN
INTRAVENOUS | Status: AC
  Filled 2015-02-14: qty 10

## 2015-02-14 MED ORDER — CISATRACURIUM BESYLATE (PF) 10 MG/5ML IV SOLN
INTRAVENOUS | Status: DC | PRN
  Administered 2015-02-14: 6 mg via INTRAVENOUS
  Administered 2015-02-14: 1 mg via INTRAVENOUS
  Administered 2015-02-14: 4 mg via INTRAVENOUS
  Administered 2015-02-14: 12 mg via INTRAVENOUS

## 2015-02-14 MED ORDER — HYDROMORPHONE HCL 2 MG/ML IJ SOLN
INTRAMUSCULAR | Status: AC
  Filled 2015-02-14: qty 1

## 2015-02-14 MED ORDER — PANTOPRAZOLE SODIUM 40 MG IV SOLR
40.0000 mg | Freq: Two times a day (BID) | INTRAVENOUS | Status: DC
  Administered 2015-02-14 – 2015-02-16 (×4): 40 mg via INTRAVENOUS
  Filled 2015-02-14 (×5): qty 40

## 2015-02-14 MED ORDER — ONDANSETRON HCL 4 MG/2ML IJ SOLN
INTRAMUSCULAR | Status: AC
  Filled 2015-02-14: qty 2

## 2015-02-14 MED ORDER — GLYCOPYRROLATE 0.2 MG/ML IJ SOLN
INTRAMUSCULAR | Status: DC | PRN
Start: 2015-02-14 — End: 2015-02-14
  Administered 2015-02-14: .8 mg via INTRAVENOUS

## 2015-02-14 MED ORDER — EPHEDRINE SULFATE 50 MG/ML IJ SOLN
INTRAMUSCULAR | Status: AC
  Filled 2015-02-14: qty 1

## 2015-02-14 MED ORDER — LIP MEDEX EX OINT
TOPICAL_OINTMENT | CUTANEOUS | Status: AC
  Filled 2015-02-14: qty 7

## 2015-02-14 MED ORDER — ONDANSETRON HCL 4 MG/2ML IJ SOLN: 4.0000 mg | Freq: Once | INTRAMUSCULAR | Status: AC | PRN

## 2015-02-14 MED ORDER — HEPARIN SODIUM (PORCINE) 5000 UNIT/ML IJ SOLN
5000.0000 [IU] | Freq: Three times a day (TID) | INTRAMUSCULAR | Status: DC
  Filled 2015-02-14: qty 1

## 2015-02-14 MED ORDER — OXYCODONE HCL 5 MG/5ML PO SOLN: 5.0000 mg | ORAL | Status: DC | PRN

## 2015-02-14 MED ORDER — MIDAZOLAM HCL 5 MG/5ML IJ SOLN
INTRAMUSCULAR | Status: DC | PRN
  Administered 2015-02-14: 2 mg via INTRAVENOUS

## 2015-02-14 MED ORDER — GLYCOPYRROLATE 0.2 MG/ML IJ SOLN
INTRAMUSCULAR | Status: AC
  Filled 2015-02-14: qty 4

## 2015-02-14 MED ORDER — SUFENTANIL CITRATE 50 MCG/ML IV SOLN
INTRAVENOUS | Status: DC | PRN
  Administered 2015-02-14: 5 ug via INTRAVENOUS
  Administered 2015-02-14 (×2): 10 ug via INTRAVENOUS
  Administered 2015-02-14: 25 ug via INTRAVENOUS

## 2015-02-14 MED ORDER — CETYLPYRIDINIUM CHLORIDE 0.05 % MT LIQD
7.0000 mL | Freq: Two times a day (BID) | OROMUCOSAL | Status: DC
  Administered 2015-02-14 – 2015-02-15 (×3): 7 mL via OROMUCOSAL

## 2015-02-14 MED ORDER — HYDROMORPHONE HCL 1 MG/ML IJ SOLN
INTRAMUSCULAR | Status: DC | PRN
  Administered 2015-02-14 (×4): 0.5 mg via INTRAVENOUS

## 2015-02-14 MED ORDER — ACETAMINOPHEN 160 MG/5ML PO SOLN
325.0000 mg | ORAL | Status: DC | PRN
Start: 2015-02-15 — End: 2015-02-16

## 2015-02-14 MED ORDER — HYDROMORPHONE HCL 1 MG/ML IJ SOLN
INTRAMUSCULAR | Status: AC
  Filled 2015-02-14: qty 1

## 2015-02-14 MED ORDER — ACETAMINOPHEN 160 MG/5ML PO SOLN
650.0000 mg | ORAL | Status: DC | PRN
  Filled 2015-02-14: qty 20.3

## 2015-02-14 MED ORDER — UNJURY CHOCOLATE CLASSIC POWDER
2.0000 [oz_av] | Freq: Four times a day (QID) | ORAL | Status: DC
  Administered 2015-02-16: 2 [oz_av] via ORAL

## 2015-02-14 MED ORDER — SUFENTANIL CITRATE 50 MCG/ML IV SOLN
INTRAVENOUS | Status: AC
  Filled 2015-02-14: qty 1

## 2015-02-14 MED ORDER — UNJURY CHICKEN SOUP POWDER: 2.0000 [oz_av] | Freq: Four times a day (QID) | ORAL | Status: DC

## 2015-02-14 MED ORDER — LIDOCAINE HCL (CARDIAC) 20 MG/ML IV SOLN
INTRAVENOUS | Status: DC | PRN
  Administered 2015-02-14: 100 mg via INTRAVENOUS

## 2015-02-14 MED ORDER — DEXAMETHASONE SODIUM PHOSPHATE 10 MG/ML IJ SOLN
INTRAMUSCULAR | Status: DC | PRN
  Administered 2015-02-14: 10 mg via INTRAVENOUS

## 2015-02-14 MED ORDER — LACTATED RINGERS IR SOLN
Status: DC | PRN
Start: 2015-02-14 — End: 2015-02-14
  Administered 2015-02-14: 1000 mL

## 2015-02-14 MED ORDER — DEXTROSE 5 % IV SOLN
2.0000 g | INTRAVENOUS | Status: AC
  Administered 2015-02-14 (×2): 2 g via INTRAVENOUS

## 2015-02-14 MED ORDER — HYDROMORPHONE HCL 1 MG/ML IJ SOLN: 0.5000 mg | INTRAMUSCULAR | Status: DC | PRN

## 2015-02-14 SURGICAL SUPPLY — 63 items
APPLICATOR COTTON TIP 6IN STRL (MISCELLANEOUS) ×4 IMPLANT
APPLIER CLIP 5 13 M/L LIGAMAX5 (MISCELLANEOUS)
APPLIER CLIP ROT 10 11.4 M/L (STAPLE)
APPLIER CLIP ROT 13.4 12 LRG (CLIP)
APR CLP LRG 13.4X12 ROT 20 MLT (CLIP)
APR CLP MED LRG 11.4X10 (STAPLE)
APR CLP MED LRG 5 ANG JAW (MISCELLANEOUS)
BLADE SURG 15 STRL LF DISP TIS (BLADE) ×1 IMPLANT
BLADE SURG 15 STRL SS (BLADE) ×1
CABLE HIGH FREQUENCY MONO STRZ (ELECTRODE) ×2 IMPLANT
CLIP APPLIE 5 13 M/L LIGAMAX5 (MISCELLANEOUS) IMPLANT
CLIP APPLIE ROT 10 11.4 M/L (STAPLE) IMPLANT
CLIP APPLIE ROT 13.4 12 LRG (CLIP) IMPLANT
DEVICE SUT QUICK LOAD TK 5 (STAPLE) IMPLANT
DEVICE SUT TI-KNOT TK 5X26 (MISCELLANEOUS) IMPLANT
DEVICE SUTURE ENDOST 10MM (ENDOMECHANICALS) IMPLANT
DEVICE TROCAR PUNCTURE CLOSURE (ENDOMECHANICALS) ×2 IMPLANT
DISSECTOR BLUNT TIP ENDO 5MM (MISCELLANEOUS) ×2 IMPLANT
DISSECTOR ULTRASONIC 48CM (MISCELLANEOUS) ×2 IMPLANT
DRAPE CAMERA CLOSED 9X96 (DRAPES) ×2 IMPLANT
ELECT REM PT RETURN 9FT ADLT (ELECTROSURGICAL) ×2
ELECTRODE REM PT RTRN 9FT ADLT (ELECTROSURGICAL) ×1 IMPLANT
GAUZE PACKING IODOFORM 2 (PACKING) ×2 IMPLANT
GAUZE SPONGE 4X4 12PLY STRL (GAUZE/BANDAGES/DRESSINGS) IMPLANT
GLOVE BIOGEL M 8.0 STRL (GLOVE) ×2 IMPLANT
GOWN STRL REUS W/TWL XL LVL3 (GOWN DISPOSABLE) ×8 IMPLANT
HANDLE STAPLE EGIA 4 XL (STAPLE) ×2 IMPLANT
HOVERMATT SINGLE USE (MISCELLANEOUS) ×2 IMPLANT
KIT BASIN OR (CUSTOM PROCEDURE TRAY) ×2 IMPLANT
LIQUID BAND (GAUZE/BANDAGES/DRESSINGS) ×2 IMPLANT
NEEDLE SPNL 22GX3.5 QUINCKE BK (NEEDLE) ×4 IMPLANT
PACK UNIVERSAL I (CUSTOM PROCEDURE TRAY) ×2 IMPLANT
PEN SKIN MARKING BROAD (MISCELLANEOUS) ×2 IMPLANT
RELOAD TRI 45 ART MED THCK BLK (STAPLE) ×4 IMPLANT
RELOAD TRI 45 ART MED THCK PUR (STAPLE) IMPLANT
RELOAD TRI 60 ART MED THCK BLK (STAPLE) ×10 IMPLANT
RELOAD TRI 60 ART MED THCK PUR (STAPLE) IMPLANT
SCISSORS LAP 5X45 EPIX DISP (ENDOMECHANICALS) ×2 IMPLANT
SCRUB PCMX 4 OZ (MISCELLANEOUS) ×4 IMPLANT
SEALANT SURGICAL APPL DUAL CAN (MISCELLANEOUS) IMPLANT
SET IRRIG TUBING LAPAROSCOPIC (IRRIGATION / IRRIGATOR) ×2 IMPLANT
SHEARS CURVED HARMONIC AC 45CM (MISCELLANEOUS) IMPLANT
SLEEVE ADV FIXATION 5X100MM (TROCAR) ×4 IMPLANT
SLEEVE GASTRECTOMY 36FR VISIGI (MISCELLANEOUS) ×2 IMPLANT
SOLUTION ANTI FOG 6CC (MISCELLANEOUS) ×2 IMPLANT
SPONGE LAP 18X18 X RAY DECT (DISPOSABLE) ×2 IMPLANT
STAPLER VISISTAT 35W (STAPLE) ×2 IMPLANT
SUT SURGIDAC NAB ES-9 0 48 120 (SUTURE) IMPLANT
SUT VIC AB 4-0 SH 18 (SUTURE) ×2 IMPLANT
SUT VICRYL 0 TIES 12 18 (SUTURE) ×2 IMPLANT
SYR 20CC LL (SYRINGE) ×2 IMPLANT
SYR 50ML LL SCALE MARK (SYRINGE) IMPLANT
TOWEL OR 17X26 10 PK STRL BLUE (TOWEL DISPOSABLE) ×4 IMPLANT
TOWEL OR NON WOVEN STRL DISP B (DISPOSABLE) ×2 IMPLANT
TRAY FOLEY W/METER SILVER 14FR (SET/KITS/TRAYS/PACK) IMPLANT
TROCAR ADV FIXATION 12X100MM (TROCAR) ×2 IMPLANT
TROCAR ADV FIXATION 5X100MM (TROCAR) ×2 IMPLANT
TROCAR BLADELESS 15MM (ENDOMECHANICALS) ×2 IMPLANT
TROCAR BLADELESS OPT 5 100 (ENDOMECHANICALS) ×2 IMPLANT
TUBE CALIBRATION LAPBAND (TUBING) IMPLANT
TUBING CONNECTING 10 (TUBING) ×4 IMPLANT
TUBING ENDO SMARTCAP (MISCELLANEOUS) ×2 IMPLANT
TUBING FILTER THERMOFLATOR (ELECTROSURGICAL) ×2 IMPLANT

## 2015-02-14 NOTE — Interval H&P Note (Signed)
History and Physical Interval Note:  02/14/2015 10:31 AM  Joseph Lang  has presented today for surgery, with the diagnosis of MORBID OBESITY  The various methods of treatment have been discussed with the patient and family. After consideration of risks, benefits and other options for treatment, the patient has consented to  Procedure(s): LAPAROSCOPIC GASTRIC SLEEVE RESECTION (N/A) as a surgical intervention .  The patient's history has been reviewed, patient examined, no change in status, stable for surgery.  I have reviewed the patient's chart and labs.  Questions were answered to the patient's satisfaction.     Wael Maestas B

## 2015-02-14 NOTE — Progress Notes (Signed)
RT placed patient on CPAP. Patient not aware of home setting so pt is on auto 5-20 cmH2O. Sterile water added to water chamber for humidification. 3 liters oxygen bleed into tubing. RT will continue to monitor as needed.

## 2015-02-14 NOTE — Progress Notes (Signed)
Awaiting bed in ICU stepdown.  Dr. Daphine DeutscherMartin in to give order for transfer.  Pt vomited total of 800cc bright red blood w/ multiple clots noted over 3 hours.

## 2015-02-14 NOTE — Progress Notes (Signed)
Report called to melinda, rn in ice stepdown.  Pt transferred to icu stepdown via baribed

## 2015-02-14 NOTE — Progress Notes (Signed)
Received pt from [facility] west due to vomiting bright red blood with clots.  Pt currently resting after Morphine, protonix, and zofran given.  Pt very concerned about feeling like he is going to die.  Pts mother is present and helpful in calming pt. VSS, bp did drop after Morphine but heart rate has not changed.  Dr. Abbey Chattersosenbower in, orders received.  Barnett HatterKallam, Caydon Feasel P

## 2015-02-14 NOTE — Progress Notes (Signed)
Patient alert and oriented, op day.  Patient complains of pain and nausea, had episode of vomiting bright red blood with clots while I was in the room.  Approximately of bright red bllod with clots, family is concerned.  Checked VS, BP 160/79, afebrile, HR 88, SpO2 95% on 2L.  Provided support and encouragement.  Encouraged pulmonary toilet, and  ambulation All questions answered.  Will continue to monitor.

## 2015-02-14 NOTE — Anesthesia Procedure Notes (Signed)
Procedure Name: Intubation Date/Time: 02/14/2015 10:51 AM Performed by: Joseph LibmanEARDON, Joseph Rueckert L Patient Re-evaluated:Patient Re-evaluated prior to inductionOxygen Delivery Method: Circle system utilized Preoxygenation: Pre-oxygenation with 100% oxygen Intubation Type: IV induction Ventilation: Mask ventilation without difficulty and Oral airway inserted - appropriate to patient size Laryngoscope Size: Miller and 3 Grade View: Grade I Tube type: Oral Tube size: 8.0 mm Number of attempts: 1 Airway Equipment and Method: Stylet Placement Confirmation: ETT inserted through vocal cords under direct vision and breath sounds checked- equal and bilateral Secured at: 22 cm Tube secured with: Tape Dental Injury: Teeth and Oropharynx as per pre-operative assessment

## 2015-02-14 NOTE — Progress Notes (Signed)
Patient ID: Joseph Lang, male   DOB: Jan 24, 1976, 39 y.o.   MRN: [id number] [facility] Progress Note:   Day of Surgery  Subjective: Mental status is alert;  nauseated Objective: Vital signs in last 24 hours: Temp:  [97.4 F (36.3 C)-98.4 F (36.9 C)] 98.4 F (36.9 C) (05/02 1749) Pulse Rate:  [66-96] 96 (05/02 1749) Resp:  [16-24] 24 (05/02 1749) BP: (150-176)/(76-91) 150/85 mmHg (05/02 1749) SpO2:  [94 %-100 %] 94 % (05/02 1749) Weight:  [195.274 kg (430 lb 8 oz)] 195.274 kg (430 lb 8 oz) (05/02 0738)  Intake/Output from previous day:   Intake/Output this shift: Total I/O In: 1850 [I.V.:1850] Out: 1150 [Urine:300; Emesis/NG output:800; Blood:50]  Physical Exam: Work of breathing is not labored.  Complaining of pain in upper midline trocar site.  Vomited blood and mucous noted.    Lab Results:  Results for orders placed or performed during the hospital encounter of 02/14/15 (from the past 48 hour(s))  CBC     Status: Abnormal   Collection Time: 02/14/15  2:30 PM  Result Value Ref Range   WBC 15.2 (H) 4.0 - 10.5 K/uL    Comment: WHITE COUNT CONFIRMED ON SMEAR ADJUSTED FOR NUCLEATED RBC'S    RBC 5.19 4.22 - 5.81 MIL/uL   Hemoglobin 15.3 13.0 - 17.0 g/dL   HCT 44.3 39.0 - 52.0 %   MCV 85.4 78.0 - 100.0 fL   MCH 29.5 26.0 - 34.0 pg   MCHC 34.5 30.0 - 36.0 g/dL   RDW 13.4 11.5 - 15.5 %   Platelets 217 150 - 400 K/uL  Creatinine, serum     Status: None   Collection Time: 02/14/15  2:30 PM  Result Value Ref Range   Creatinine, Ser 0.85 0.61 - 1.24 mg/dL   GFR calc non Af Amer >60 >60 mL/min   GFR calc Af Amer >60 >60 mL/min    Comment: (NOTE) The eGFR has been calculated using the CKD EPI equation. This calculation has not been validated in all clinical situations. eGFR's persistently <90 mL/min signify possible Chronic Kidney Disease.     Radiology/Results: No results found.  Anti-infectives: Anti-infectives    Start     Dose/Rate Route Frequency  Ordered Stop   02/14/15 0712  cefOXitin (MEFOXIN) 2 g in dextrose 5 % 50 mL IVPB     2 g 100 mL/hr over 30 Minutes Intravenous On call to O.R. 02/14/15 4098 02/14/15 1250      Assessment/Plan: Problem List: Patient Active Problem List   Diagnosis Date Noted  . S/P laparoscopic sleeve gastrectomy May 2016 02/14/2015  . BMI 50.0-59.9, adult 07/18/2014    UGI bleeding post sleeve.  Phlebotomy just in to draw stat CBC.  Vitals stable thus far.  I discussed return to the OR and endoscopy with him and his mother if this persists.  Will transfer to ICU/stepdown for observation.  Heparin held.  Protonix started.   Day of Surgery    LOS: 0 days   Matt B. Hassell Done, MD, [facility] [phone] beeper [phone]  02/14/2015 6:16 PM

## 2015-02-14 NOTE — Anesthesia Postprocedure Evaluation (Signed)
  Anesthesia Post-op Note  Patient: Joseph Lang  Procedure(s) Performed: Procedure(s): LAPAROSCOPIC GASTRIC SLEEVE RESECTION WITH UPPER ENDOSCOPY (N/A)  Patient Location: PACU  Anesthesia Type:General  Level of Consciousness: awake, oriented, sedated and patient cooperative  Airway and Oxygen Therapy: Patient Spontanous Breathing  Post-op Pain: mild, moderate  Post-op Assessment: Post-op Vital signs reviewed, Patient's Cardiovascular Status Stable, Respiratory Function Stable, Patent Airway, No signs of Nausea or vomiting and Pain level controlled  Post-op Vital Signs: stable  Last Vitals:  Filed Vitals:   02/14/15 1430  BP: 168/86  Pulse:   Temp: 36.6 C  Resp:     Complications: No apparent anesthesia complications

## 2015-02-14 NOTE — Anesthesia Preprocedure Evaluation (Signed)
Anesthesia Evaluation  Patient identified by MRN, date of birth, ID band Patient awake    Reviewed: Allergy & Precautions, NPO status , Patient's Chart, lab work & pertinent test results  Airway Mallampati: II  TM Distance: >3 FB     Dental   Pulmonary sleep apnea , former smoker,    Pulmonary exam normal       Cardiovascular hypertension, Rhythm:Regular Rate:Normal     Neuro/Psych    GI/Hepatic   Endo/Other  Morbid obesity  Renal/GU      Musculoskeletal   Abdominal   Peds  Hematology   Anesthesia Other Findings   Reproductive/Obstetrics                             Anesthesia Physical Anesthesia Plan  ASA: III  Anesthesia Plan: General   Post-op Pain Management:    Induction: Intravenous  Airway Management Planned: Oral ETT  Additional Equipment:   Intra-op Plan:   Post-operative Plan: Extubation in OR  Informed Consent: I have reviewed the patients History and Physical, chart, labs and discussed the procedure including the risks, benefits and alternatives for the proposed anesthesia with the patient or authorized representative who has indicated his/her understanding and acceptance.     Plan Discussed with: CRNA, Anesthesiologist and Surgeon  Anesthesia Plan Comments:         Anesthesia Quick Evaluation

## 2015-02-14 NOTE — Progress Notes (Signed)
Some nausea but no further vomiting.  VS within normal limits.  Hemoglobin 15.3->13.7.  Will add Phenergan to nausea regimen.  Repeat CBC in AM unless he has more hematemesis tonight.

## 2015-02-14 NOTE — Op Note (Signed)
Surgeon: Wenda LowMatt Aritzel Krusemark, MD, FACS  Asst:  Gaynelle AduEric Wilson, MD, FACS  Anes:  General endotracheal  Procedure: Laparoscopic sleeve gastrectomy and upper endoscopy  Diagnosis: Morbid obesity  Complications: none  EBL:   15 cc  Description of Procedure:  The patient was take to OR 11 and given general anesthesia.  The abdomen was prepped with PCMX and draped sterilely.  A timeout was performed.  Access to the abdomen was achieved with 5 mm Optiview through the left upper quadrant.  Following insufflation, the state of the abdomen was found to be free of adhesions.  The ViSiGi 36Fr tube was inserted to deflate the stomach and was pulled back into the esophagus.  Balloon test was done to check for hiatus hernia and this was negative.  No dimple was seen.    The pylorus was identified and we measured 5 cm back and marked the antrum.  At that point we began dissection to take down the greater curvature of the stomach using the Harmonic scalpel.  This dissection was taken all the way up to the left crus.  Posterior attachments of the stomach were also taken down.    The ViSiGi tube was then passed into the antrum and suction applied so that it was snug along the lessor curvature.  The "crow's foot" or incisura was identified.  The sleeve gastrectomy was begun using the [employer] InternationalCovidien platform stapler beginning with a 4.5 mm black load with TRS.  We followed along the VisiGi with multiple applications of the Covidien black load 6 cm.    When the sleeve was complete the tube was taken off suction and insufflated briefly.  The tube was withdrawn.  Upper endoscopy was then performed by Dr. Andrey CampanileWilson.  This showed an intact sleeve without bleeding or leaks.     The specimen was extracted through the 15 trocar site with some difficulty because he was in retrospect waking up and clamping down on our 15 mm oblique trocar site.  Wounds were infiltrated with Exparel and closed with 4-0 vicryl except the 15 mm trocar that was  packed with 6" of 1 inch Iodophor and closed with 4-0 vicryl laterally with Liquiban.    Matt B. Daphine DeutscherMartin, MD, [facility], [place] [phone]

## 2015-02-14 NOTE — H&P (View-Only) (Signed)
Chief Complaint:  Morbid obesity BMI 57  History of Present Illness:  Joseph Lang is an 39 y.o. male who was seen in the office in February to discuss bariatric surgery.  We decided upon a sleeve gastrectomy and he was seen back in the office on April 20 for his preop appt.  All questions were answered regarding his upcoming sleeve gastrectomy and he is aware of the risks.  He had a severe pelvic fracture treated at [facility] by Dr. Larry Webb with a good outcome.  He is aware that he might have adhesions from this surgery.  On UGI he has a small hiatal hernia with reflux and ultrasound was negative.  Will proceed with lap sleeve gastrectomy.    Past Medical History  Diagnosis Date  . Obesity   . Hypertension     Past Surgical History  Procedure Laterality Date  . Hip surgery    . Pelvis closed reduction      Current Outpatient Prescriptions  Medication Sig Dispense Refill  . benazepril (LOTENSIN) 20 MG tablet Take 20 mg by mouth every morning.     . HYDROcodone-homatropine (HYCODAN) 5-1.5 MG/5ML syrup Take 5 mLs by mouth every 6 (six) hours as needed. (Patient not taking: Reported on 12/29/2014) 120 mL 0  . meclizine (ANTIVERT) 25 MG tablet Take 1 tablet (25 mg total) by mouth 3 (three) times daily as needed for dizziness. (Patient not taking: Reported on 12/29/2014) 20 tablet 0  . predniSONE (DELTASONE) 20 MG tablet 4/3/3/2/2/1/1 single daily dose for 7 days (Patient not taking: Reported on 12/29/2014) 16 tablet 0   No current facility-administered medications for this visit.   Amoxicillin No family history on file. Social History:   reports that he has been smoking Cigarettes.  He does not have any smokeless tobacco history on file. He reports that he drinks alcohol. He reports that he does not use illicit drugs.   REVIEW OF SYSTEMS : Negative except for see HPI   Physical Exam:   There were no vitals taken for this visit. There is no weight on file to calculate BMI.  Gen:  WDWN WM  NAD  Neurological: Alert and oriented to person, place, and time. Motor and sensory function is grossly intact  Head: Normocephalic and atraumatic.  Eyes: Conjunctivae are normal. Pupils are equal, round, and reactive to light. No scleral icterus.  Neck: Normal range of motion. Neck supple. No tracheal deviation or thyromegaly present.  Cardiovascular:  SR without murmurs or gallops.  No carotid bruits Breast:  Not examined Respiratory: Effort normal.  No respiratory distress. No chest wall tenderness. Breath sounds normal.  No wheezes, rales or rhonchi.  Abdomen:  Obese and nontender:  There is a contact allergy rash at his beltline that he says is fungus.  Will treat with topical GU:  Normal male Musculoskeletal: Normal range of motion. Extremities are nontender. No cyanosis, edema or clubbing noted Lymphadenopathy: No cervical, preauricular, postauricular or axillary adenopathy is present Skin: Skin is warm and dry. No rash noted. No diaphoresis. No erythema. No pallor. Pscyh: Normal mood and affect. Behavior is normal. Judgment and thought content normal.   LABORATORY RESULTS: No results found for this or any previous visit (from the past 48 hour(s)).   RADIOLOGY RESULTS: No results found.  Problem List: Patient Active Problem List   Diagnosis Date Noted  . BMI 50.0-59.9, adult 07/18/2014    Assessment & Plan: Morbid obesity for sleeve gastrectomy. Will assess hiatus for hiatal hernia.         Matt B. Refoel Palladino, MD, FACS  [facility] [phone] beeper [phone]  02/02/2015 10:01 AM      

## 2015-02-14 NOTE — Op Note (Signed)
Joseph Lang [id number] 1976/07/29 02/14/2015  Preoperative diagnosis: morbid obesity  Postoperative diagnosis: Same   Procedure: upper endoscopy  Surgeon: Mary SellaEric M. Lillianna Sabel M.D., FACS   Anesthesia: Gen.   Indications for procedure: [age]year old WM undergoing Laparoscopic Gastric Sleeve Resection and an EGD was requested to evaluate the new gastric sleeve.   Description of procedure: After we have completed the sleeve resection, I scrubbed out and obtained the Olympus endoscope. I gently placed endoscope in the patient's oropharynx and gently glided it down the esophagus without any difficulty under direct visualization. Once I was in the gastric sleeve, I insufflated the stomach with air. I was able to cannulate and advanced the scope through the gastric sleeve. I was able to cannulate the duodenum with ease. Dr. Daphine DeutscherMartin had placed saline in the upper abdomen. Upon further insufflation of the gastric sleeve there was no evidence of bubbles. GE junction located at 49 cm.  Upon further inspection of the gastric sleeve, the mucosa appeared normal. There is no evidence of any mucosal abnormality. The sleeve was widely patent at the angularis. There was no evidence of bleeding. The gastric sleeve was decompressed. The scope was withdrawn. The patient tolerated this portion of the procedure well. Please see Dr Ermalene SearingMartin's operative note for details regarding the laparoscopic gastric sleeve resection.   Mary SellaEric M. Andrey CampanileWilson, MD, FACS  General, Bariatric, & Minimally Invasive Surgery  [facility] [place]

## 2015-02-14 NOTE — Progress Notes (Signed)
Dr Abbey Chattersosenbower notified of pt's frequent bloody emesis.  VSS.  Orders received.

## 2015-02-14 NOTE — Transfer of Care (Signed)
Immediate Anesthesia Transfer of Care Note  Patient: Joseph Lang  Procedure(s) Performed: Procedure(s): LAPAROSCOPIC GASTRIC SLEEVE RESECTION WITH UPPER ENDOSCOPY (N/A)  Patient Location: PACU  Anesthesia Type:General  Level of Consciousness: awake, alert  and oriented  Airway & Oxygen Therapy: Patient Spontanous Breathing and Patient connected to face mask oxygen  Post-op Assessment: Report given to RN and Post -op Vital signs reviewed and stable  Post vital signs: Reviewed and stable  Last Vitals:  Filed Vitals:   02/14/15 0708  BP: 151/76  Pulse: 66  Temp: 36.5 C  Resp: 18    Complications: No apparent anesthesia complications

## 2015-02-15 ENCOUNTER — Inpatient Hospital Stay (HOSPITAL_COMMUNITY): Payer: BLUE CROSS/BLUE SHIELD

## 2015-02-15 ENCOUNTER — Encounter (HOSPITAL_COMMUNITY): Payer: Self-pay | Admitting: Surgery

## 2015-02-15 LAB — CBC WITH DIFFERENTIAL/PLATELET
Basophils Absolute: 0 10*3/uL (ref 0.0–0.1)
Basophils Relative: 0 % (ref 0–1)
Eosinophils Absolute: 0 10*3/uL (ref 0.0–0.7)
Eosinophils Relative: 0 % (ref 0–5)
HCT: 38.1 % — ABNORMAL LOW (ref 39.0–52.0)
Hemoglobin: 12.5 g/dL — ABNORMAL LOW (ref 13.0–17.0)
Lymphocytes Relative: 7 % — ABNORMAL LOW (ref 12–46)
Lymphs Abs: 1.1 10*3/uL (ref 0.7–4.0)
MCH: 28 pg (ref 26.0–34.0)
MCHC: 32.8 g/dL (ref 30.0–36.0)
MCV: 85.2 fL (ref 78.0–100.0)
Monocytes Absolute: 1 10*3/uL (ref 0.1–1.0)
Monocytes Relative: 6 % (ref 3–12)
Neutro Abs: 14 10*3/uL — ABNORMAL HIGH (ref 1.7–7.7)
Neutrophils Relative %: 87 % — ABNORMAL HIGH (ref 43–77)
Platelets: 246 10*3/uL (ref 150–400)
RBC: 4.47 MIL/uL (ref 4.22–5.81)
RDW: 13.3 % (ref 11.5–15.5)
WBC: 16.1 10*3/uL — ABNORMAL HIGH (ref 4.0–10.5)

## 2015-02-15 LAB — ABO/RH: ABO/RH(D): A POS

## 2015-02-15 LAB — HEMOGLOBIN AND HEMATOCRIT, BLOOD
HCT: 38.2 % — ABNORMAL LOW (ref 39.0–52.0)
Hemoglobin: 12.6 g/dL — ABNORMAL LOW (ref 13.0–17.0)

## 2015-02-15 MED ORDER — IOHEXOL 300 MG/ML  SOLN
50.0000 mL | Freq: Once | INTRAMUSCULAR | Status: AC | PRN
  Administered 2015-02-15: 30 mL via ORAL

## 2015-02-15 NOTE — Progress Notes (Signed)
Patient alert and oriented, Post op day 1.  Provided support and encouragement.  Encouraged pulmonary toilet, ambulation and small sips of liquids.  All questions answered.  Will continue to monitor. 

## 2015-02-15 NOTE — Care Management Note (Signed)
Case Management Note  Patient Details  Name: Joseph Lang MRN: [id number] Date of Birth: 09/19/1976  Subjective/Objective:               Laparoscopic sleeve gastrectomy and upper endoscopy     Action/Plan: Home when stable  Expected Discharge Date:  02/18/15               Expected Discharge Plan:  Home/Self Care  In-House Referral:  NA  Discharge planning Services  CM Consult  Post Acute Care Choice:  NA Choice offered to:  NA  DME Arranged:    DME Agency:     HH Arranged:    HH Agency:     Status of Service:     Medicare Important Message Given:    Date Medicare IM Given:    Medicare IM give by:    Date Additional Medicare IM Given:    Additional Medicare Important Message give by:     If discussed at Long Length of Stay Meetings, dates discussed:    Additional Comments:  Golda AcreDavis, Carolann Brazell Lynn, RN 02/15/2015, 10:08 AM

## 2015-02-15 NOTE — Plan of Care (Signed)
Problem: Phase I Progression Outcomes Goal: Bariatric bed/trapeze per MD Outcome: Completed/Met Date Met:  02/15/15 Bari bed

## 2015-02-15 NOTE — Progress Notes (Signed)
Patient ID: Joseph Lang, male   DOB: [birth year], 39 y.o.   MRN: [id number] [facility] Surgery Progress Note:   1 Day Post-Op  Subjective: Mental status is fairly clear.  Was less nauseated this morning.  Better after going to the floor.    Objective: Vital signs in last 24 hours: Temp:  [98.1 F (36.7 C)-99 F (37.2 C)] 98.7 F (37.1 C) (05/03 2157) Pulse Rate:  [77-93] 77 (05/03 2157) Resp:  [19-25] 20 (05/03 2157) BP: (111-155)/(58-78) 149/69 mmHg (05/03 2157) SpO2:  [92 %-100 %] 100 % (05/03 2157) Weight:  [195.047 kg (430 lb)] 195.047 kg (430 lb) (05/03 0400)  Intake/Output from previous day: 05/02 0701 - 05/03 0700 In: 3508.3 [P.O.:135; I.V.:3373.3] Out: 3300 [Urine:2450; Emesis/NG output:800; Blood:50] Intake/Output this shift: Total I/O In: 505 [P.O.:60; I.V.:445] Out: 0   Physical Exam: Work of breathing is not labored.  Used CPaP llast night in the unit.  Incisions are sore.    Lab Results:  Results for orders placed or performed during the hospital encounter of 02/14/15 (from the past 48 hour(s))  CBC     Status: Abnormal   Collection Time: 02/14/15  2:30 PM  Result Value Ref Range   WBC 15.2 (H) 4.0 - 10.5 K/uL    Comment: WHITE COUNT CONFIRMED ON SMEAR ADJUSTED FOR NUCLEATED RBC'S    RBC 5.19 4.22 - 5.81 MIL/uL   Hemoglobin 15.3 13.0 - 17.0 g/dL   HCT 44.3 39.0 - 52.0 %   MCV 85.4 78.0 - 100.0 fL   MCH 29.5 26.0 - 34.0 pg   MCHC 34.5 30.0 - 36.0 g/dL   RDW 13.4 11.5 - 15.5 %   Platelets 217 150 - 400 K/uL  Creatinine, serum     Status: None   Collection Time: 02/14/15  2:30 PM  Result Value Ref Range   Creatinine, Ser 0.85 0.61 - 1.24 mg/dL   GFR calc non Af Amer >60 >60 mL/min   GFR calc Af Amer >60 >60 mL/min    Comment: (NOTE) The eGFR has been calculated using the CKD EPI equation. This calculation has not been validated in all clinical situations. eGFR's persistently <90 mL/min signify possible Chronic Kidney Disease.   Type and screen      Status: None   Collection Time: 02/14/15  6:10 PM  Result Value Ref Range   ABO/RH(D) A POS    Antibody Screen NEG    Sample Expiration 02/17/2015   ABO/Rh     Status: None   Collection Time: 02/14/15  6:10 PM  Result Value Ref Range   ABO/RH(D) A POS   CBC     Status: Abnormal   Collection Time: 02/14/15  7:30 PM  Result Value Ref Range   WBC 19.7 (H) 4.0 - 10.5 K/uL   RBC 4.69 4.22 - 5.81 MIL/uL   Hemoglobin 13.7 13.0 - 17.0 g/dL   HCT 40.1 39.0 - 52.0 %   MCV 85.5 78.0 - 100.0 fL   MCH 29.2 26.0 - 34.0 pg   MCHC 34.2 30.0 - 36.0 g/dL   RDW 13.1 11.5 - 15.5 %   Platelets 249 150 - 400 K/uL  CBC WITH DIFFERENTIAL     Status: Abnormal   Collection Time: 02/15/15  3:41 AM  Result Value Ref Range   WBC 16.1 (H) 4.0 - 10.5 K/uL   RBC 4.47 4.22 - 5.81 MIL/uL   Hemoglobin 12.5 (L) 13.0 - 17.0 g/dL   HCT 38.1 (L) 39.0 - 52.0 %  MCV 85.2 78.0 - 100.0 fL   MCH 28.0 26.0 - 34.0 pg   MCHC 32.8 30.0 - 36.0 g/dL   RDW 13.3 11.5 - 15.5 %   Platelets 246 150 - 400 K/uL   Neutrophils Relative % 87 (H) 43 - 77 %   Neutro Abs 14.0 (H) 1.7 - 7.7 K/uL   Lymphocytes Relative 7 (L) 12 - 46 %   Lymphs Abs 1.1 0.7 - 4.0 K/uL   Monocytes Relative 6 3 - 12 %   Monocytes Absolute 1.0 0.1 - 1.0 K/uL   Eosinophils Relative 0 0 - 5 %   Eosinophils Absolute 0.0 0.0 - 0.7 K/uL   Basophils Relative 0 0 - 1 %   Basophils Absolute 0.0 0.0 - 0.1 K/uL  Hemoglobin and hematocrit, blood     Status: Abnormal   Collection Time: 02/15/15  4:55 PM  Result Value Ref Range   Hemoglobin 12.6 (L) 13.0 - 17.0 g/dL   HCT 38.2 (L) 39.0 - 52.0 %    Radiology/Results: Dg Ugi W/water Sol Cm  02/15/2015   CLINICAL DATA:  39 year old male status post laparoscopic sleeve gastrectomy yesterday. Hematemesis after surgery.  EXAM: WATER SOLUBLE UPPER GI SERIES  TECHNIQUE: Single-column upper GI series was performed using water soluble contrast.  CONTRAST:  25m OMNIPAQUE IOHEXOL 300 MG/ML  SOLN  COMPARISON:  12/17/2014.   FLUOROSCOPY TIME:  If the device does not provide the exposure index:  Fluoroscopy Time (in minutes and seconds):  1 minutes 53 seconds.  Number of Acquired Images:  20  FINDINGS: Preprocedure KUB demonstrated some nonspecific gas throughout the small bowel and colon, and a suture line in the region of the stomach.  Subsequently, after ingestion of Omnipaque the proximal aspect of the stomach (cardia and fundus) was opacified, but the more distal aspect of the stomach and the proximal small bowel failed to opacify at any point during the procedure. No extravasation of contrast was noted. Patient complained of dizziness, disorientation and severe nausea, and the examination was terminated.  IMPRESSION: 1. No extravasation of contrast. 2. Cardia and fundus of stomach opacified with contrast, but the more distal aspect of the stomach fails to opacify. This is presumably related to postoperative edema causing gastric obstruction at this time. Attention at time of follow-up examination is recommended to ensure resolution of this finding. These results will be called to the ordering clinician or representative by the Radiologist Assistant, and communication documented in the PACS or zVision Dashboard.   Electronically Signed   By: DVinnie LangtonM.D.   On: 02/15/2015 11:27    Anti-infectives: Anti-infectives    Start     Dose/Rate Route Frequency Ordered Stop   02/14/15 0712  cefOXitin (MEFOXIN) 2 g in dextrose 5 % 50 mL IVPB     2 g 100 mL/hr over 30 Minutes Intravenous On call to O.R. 02/14/15 0716905/02/16 1250      Assessment/Plan: Problem List: Patient Active Problem List   Diagnosis Date Noted  . S/P laparoscopic sleeve gastrectomy May 2016 02/14/2015  . BMI 50.0-59.9, adult 07/18/2014    UGI showed delayed emptying but taking sips.  Will continue on sips for now.  Back to the floor in the afternoon.   1 Day Post-Op    LOS: 1 day   Matt B. MHassell Done MD, FBristol [facility] [phone] [phone] 02/15/2015 11:08 PM

## 2015-02-15 NOTE — Plan of Care (Signed)
Problem: Food- and Nutrition-Related Knowledge Deficit (NB-1.1) Goal: Nutrition education Formal process to instruct or train a patient/client in a skill or to impart knowledge to help patients/clients voluntarily manage or modify food choices and eating behavior to maintain or improve health. Outcome: Completed/Met Date Met:  02/15/15 Nutrition Education Note  Received consult for diet education per DROP protocol.   Discussed 2 week post op diet with pt. Emphasized that liquids must be non carbonated, non caffeinated, and sugar free. Fluid goals discussed. Pt to follow up with outpatient bariatric RD for further diet progression after 2 weeks. Multivitamins and minerals also reviewed. Teach back method used, pt expressed understanding, expect good compliance.   Diet: First 2 Weeks  You will see the nutritionist about two (2) weeks after your surgery. The nutritionist will increase the types of foods you can eat if you are handling liquids well:  If you have severe vomiting or nausea and cannot handle clear liquids lasting longer than 1 day, call your surgeon  Protein Shake  Drink at least 2 ounces of shake 5-6 times per day  Each serving of protein shakes (usually 8 - 12 ounces) should have a minimum of:  15 grams of protein  And no more than 5 grams of carbohydrate  Goal for protein each day:  Men = 80 grams per day  Women = 60 grams per day  Protein powder may be added to fluids such as non-fat milk or Lactaid milk or Soy milk (limit to 35 grams added protein powder per serving)   Hydration  Slowly increase the amount of water and other clear liquids as tolerated (See Acceptable Fluids)  Slowly increase the amount of protein shake as tolerated  Sip fluids slowly and throughout the day  May use sugar substitutes in small amounts (no more than 6 - 8 packets per day; i.e. Splenda)   Fluid Goal  The first goal is to drink at least 8 ounces of protein shake/drink per day (or as directed  by the nutritionist); some examples of protein shakes are [employer], [employer], EAS Edge HP, and [place]. See handout from pre-op Bariatric Education Class:  Slowly increase the amount of protein shake you drink as tolerated  You may find it easier to slowly sip shakes throughout the day  It is important to get your proteins in first  Your fluid goal is to drink 64 - 100 ounces of fluid daily  It may take a few weeks to build up to this  32 oz (or more) should be clear liquids  And  32 oz (or more) should be full liquids (see below for examples)  Liquids should not contain sugar, caffeine, or carbonation   Clear Liquids:  Water or Sugar-free flavored water (i.e. Fruit H2O, Propel)  Decaffeinated coffee or tea (sugar-free)  Crystal Lite, Wyler's Lite, Minute Maid Lite  Sugar-free Jell-O  Bouillon or broth  Sugar-free Popsicle: *Less than 20 calories each; Limit 1 per day   Full Liquids:  Protein Shakes/Drinks + 2 choices per day of other full liquids  Full liquids must be:  No More Than 12 grams of Carbs per serving  No More Than 3 grams of Fat per serving  Strained low-fat cream soup  Non-Fat milk  Fat-free Lactaid Milk  Sugar-free yogurt ([place] Lite & Fit, [place] yogurt)   [place], [place], [place]

## 2015-02-16 LAB — CBC WITH DIFFERENTIAL/PLATELET
Basophils Absolute: 0 10*3/uL (ref 0.0–0.1)
Basophils Relative: 0 % (ref 0–1)
Eosinophils Absolute: 0.1 10*3/uL (ref 0.0–0.7)
Eosinophils Relative: 0 % (ref 0–5)
HCT: 38.3 % — ABNORMAL LOW (ref 39.0–52.0)
Hemoglobin: 12.6 g/dL — ABNORMAL LOW (ref 13.0–17.0)
Lymphocytes Relative: 23 % (ref 12–46)
Lymphs Abs: 3.3 10*3/uL (ref 0.7–4.0)
MCH: 28.6 pg (ref 26.0–34.0)
MCHC: 32.9 g/dL (ref 30.0–36.0)
MCV: 86.8 fL (ref 78.0–100.0)
Monocytes Absolute: 0.8 10*3/uL (ref 0.1–1.0)
Monocytes Relative: 6 % (ref 3–12)
Neutro Abs: 10.3 10*3/uL — ABNORMAL HIGH (ref 1.7–7.7)
Neutrophils Relative %: 71 % (ref 43–77)
Platelets: 260 10*3/uL (ref 150–400)
RBC: 4.41 MIL/uL (ref 4.22–5.81)
RDW: 13.6 % (ref 11.5–15.5)
WBC: 14.5 10*3/uL — ABNORMAL HIGH (ref 4.0–10.5)

## 2015-02-16 MED ORDER — PANTOPRAZOLE SODIUM 40 MG PO TBEC
40.0000 mg | DELAYED_RELEASE_TABLET | Freq: Every day | ORAL | Status: DC
  Administered 2015-02-16: 40 mg via ORAL
  Filled 2015-02-16: qty 1

## 2015-02-16 NOTE — Discharge Summary (Signed)
Physician Discharge Summary  Patient ID: Joseph Lang MRN: [id number] DOB/AGE: July 06, 1976 39 y.o.  Admit date: 02/14/2015 Discharge date: 02/16/2015  Admission Diagnoses:  Morbid obesity  Discharge Diagnoses:  same  Active Problems:   S/P laparoscopic sleeve gastrectomy May 2016   Surgery:  Lap sleeve gastrectomy  Discharged Condition: improved  Hospital Course:   Had surgery.  Bloody vomitus postop and move to stepdown for first night.  HG stable at 12 along with VS.  Transferred up to floor on PD 1.  UGI showed edema however patient tolerated shakes.  Ready for discharge PD 2  Consults: none  Significant Diagnostic Studies: UGI    Discharge Exam: Blood pressure 152/87, pulse 88, temperature 98.4 F (36.9 C), temperature source Oral, resp. rate 20, height 6\' 2"  (1.88 m), weight 195.047 kg (430 lb), SpO2 93 %. Packing removed from incision prior to discharge.    Disposition: 01-Home or Self Care  Discharge Instructions    Ambulate hourly while awake    Complete by:  As directed      Call MD for:  difficulty breathing, headache or visual disturbances    Complete by:  As directed      Call MD for:  persistant dizziness or light-headedness    Complete by:  As directed      Call MD for:  persistant nausea and vomiting    Complete by:  As directed      Call MD for:  redness, tenderness, or signs of infection (pain, swelling, redness, odor or green/yellow discharge around incision site)    Complete by:  As directed      Call MD for:  severe uncontrolled pain    Complete by:  As directed      Call MD for:  temperature >101 F    Complete by:  As directed      Diet bariatric full liquid    Complete by:  As directed      Discharge instructions    Complete by:  As directed   Follow bariatric dietary guidelines.  Be up and walk as much as possible     Incentive spirometry    Complete by:  As directed   Perform hourly while awake            Medication List    STOP taking  these medications        predniSONE 20 MG tablet  Commonly known as:  DELTASONE      TAKE these medications        benazepril 20 MG tablet  Commonly known as:  LOTENSIN  Take 20 mg by mouth every morning.     HYDROcodone-homatropine 5-1.5 MG/5ML syrup  Commonly known as:  HYCODAN  Take 5 mLs by mouth every 6 (six) hours as needed.     meclizine 25 MG tablet  Commonly known as:  ANTIVERT  Take 1 tablet (25 mg total) by mouth 3 (three) times daily as needed for dizziness.           Follow-up Information    Follow up with Joseph Lang,Raia Amico B, MD.   Specialty:  General Surgery   Contact information:   [address] [place] [address] [phone]       Signed: [name],Nole Robey B 02/16/2015, 9:50 AM

## 2015-02-16 NOTE — Discharge Instructions (Signed)

## 2015-02-16 NOTE — Progress Notes (Signed)
Discharge instructions given to patient along with prescription for promethazine. Questions answered.

## 2015-02-16 NOTE — Progress Notes (Signed)
Patient alert and oriented, pain is controlled. Patient is tolerating fluids,  advanced to protein shake today, patient tolerated well.  Reviewed Gastric sleeve discharge instructions with patient and patient is able to articulate understanding.  Provided information on BELT program, Support Group and [facility] outpatient pharmacy. All questions answered, will continue to monitor.  

## 2015-02-17 ENCOUNTER — Telehealth (HOSPITAL_COMMUNITY): Payer: Self-pay

## 2015-02-17 NOTE — Telephone Encounter (Signed)
Made discharge phone call to patient per DROP protocol. Asking the following questions.    1. Do you have someone to care for you now that you are home?  yes 2. Are you having pain now that is not relieved by your pain medication?  no 3. Are you able to drink the recommended daily amount of fluids (48 ounces minimum/day) and protein (60-80 grams/day) as prescribed by the dietitian or nutritional counselor?  Yes had 3 shakes today, 20 oz water today 4. Are you taking the vitamins and minerals as prescribed?  yes 5. Do you have the "on call" number to contact your surgeon if you have a problem or question?  yes 6. Are your incisions free of redness, swelling or drainage? (If steri strips, address that these can fall off, shower as tolerated) yes 7. Have your bowels moved since your surgery?  If not, are you passing gas?  No/yes 8. Are you up and walking 3-4 times per day?  yes    1. Do you have an appointment made to see your surgeon in the next month?  yes 2. Were you provided your discharge medications before your surgery or before you were discharged from the hospital and are you taking them without problem?  yes 3. Were you provided phone numbers to the clinic/surgeon's office?  yes 4. Did you watch the patient education video module in the (clinic, surgeon's office, etc.) before your surgery? yes 5. Do you have a discharge checklist that was provided to you in the hospital to reference with instructions on how to take care of yourself after surgery?  yes 6. Did you see a dietitian or nutritional counselor while you were in the hospital?  yes 7. Do you have an appointment to see a dietitian or nutritional counselor in the next month? yes

## 2015-03-01 ENCOUNTER — Encounter: Payer: BLUE CROSS/BLUE SHIELD | Attending: Surgery

## 2015-03-01 VITALS — Ht 74.0 in | Wt >= 6400 oz

## 2015-03-01 DIAGNOSIS — Z713 Dietary counseling and surveillance: Secondary | ICD-10-CM | POA: Diagnosis not present

## 2015-03-01 DIAGNOSIS — Z6841 Body Mass Index (BMI) 40.0 and over, adult: Secondary | ICD-10-CM | POA: Insufficient documentation

## 2015-03-01 NOTE — Progress Notes (Signed)
Bariatric Class:  Appt start time: 1530 end time:  1630.  2 Week Post-Operative Nutrition Class  Patient was seen on 03/01/15 for Post-[facility]ative Nutrition [facility].   Surgery date: 02/14/15 Surgery type: Gastric sleeve Start weight at [facility]: 447 lbs on 12/29/14 Weight today: 413.0 lbs  Weight change: 36 lbs  TANITA  BODY COMP RESULTS  01/24/15 03/01/15   BMI (kg/m^2) 57.6 53.0   Fat Mass (lbs) 188 174.0   Fat Free Mass (lbs) 261 239.0   Total Body Water (lbs) 191 175.0    The following the learning objectives were met by the patient during this course:  Identifies Phase 3A (Soft, High Proteins) Dietary Goals and will begin from 2 weeks post-operatively to 2 months post-operatively  Identifies appropriate sources of fluids and proteins   States protein recommendations and appropriate sources post-operatively  Identifies the need for appropriate texture modifications, mastication, and bite sizes when consuming solids  Identifies appropriate multivitamin and calcium sources post-operatively  Describes the need for physical activity post-operatively and will follow MD recommendations  States when to call healthcare provider regarding medication questions or post-operative complications  Handouts given during class include:  Phase 3A: Soft, High Protein Diet Handout  Follow-Up Plan: Patient will follow-up at [facility] in 6 weeks for 2 month post-op nutrition visit for diet advancement per MD.

## 2015-04-08 ENCOUNTER — Inpatient Hospital Stay (HOSPITAL_COMMUNITY)
Admission: EM | Admit: 2015-04-08 | Discharge: 2015-04-09 | DRG: 176 | Disposition: A | Discharge status: Home / Self Care | Payer: BLUE CROSS/BLUE SHIELD | Attending: Internal Medicine | Admitting: Internal Medicine

## 2015-04-08 ENCOUNTER — Ambulatory Visit (HOSPITAL_COMMUNITY)
Admission: RE | Admit: 2015-04-08 | Discharge: 2015-04-08 | Disposition: A | Discharge status: Home / Self Care | Payer: BLUE CROSS/BLUE SHIELD | Source: Ambulatory Visit | Attending: Internal Medicine | Admitting: Internal Medicine

## 2015-04-08 ENCOUNTER — Ambulatory Visit
Admission: RE | Admit: 2015-04-08 | Discharge: 2015-04-08 | Disposition: A | Discharge status: Home / Self Care | Payer: BLUE CROSS/BLUE SHIELD | Source: Ambulatory Visit | Attending: Internal Medicine | Admitting: Internal Medicine

## 2015-04-08 ENCOUNTER — Encounter (HOSPITAL_COMMUNITY): Payer: Self-pay | Admitting: *Deleted

## 2015-04-08 ENCOUNTER — Other Ambulatory Visit: Payer: Self-pay | Admitting: Internal Medicine

## 2015-04-08 ENCOUNTER — Encounter (HOSPITAL_COMMUNITY): Payer: Self-pay

## 2015-04-08 ENCOUNTER — Other Ambulatory Visit (HOSPITAL_COMMUNITY): Payer: Self-pay | Admitting: Internal Medicine

## 2015-04-08 DIAGNOSIS — Z79899 Other long term (current) drug therapy: Secondary | ICD-10-CM

## 2015-04-08 DIAGNOSIS — R0789 Other chest pain: Secondary | ICD-10-CM

## 2015-04-08 DIAGNOSIS — Z79891 Long term (current) use of opiate analgesic: Secondary | ICD-10-CM

## 2015-04-08 DIAGNOSIS — M79604 Pain in right leg: Secondary | ICD-10-CM | POA: Diagnosis present

## 2015-04-08 DIAGNOSIS — I2699 Other pulmonary embolism without acute cor pulmonale: Secondary | ICD-10-CM | POA: Diagnosis present

## 2015-04-08 DIAGNOSIS — R7989 Other specified abnormal findings of blood chemistry: Secondary | ICD-10-CM

## 2015-04-08 DIAGNOSIS — L03115 Cellulitis of right lower limb: Secondary | ICD-10-CM | POA: Diagnosis present

## 2015-04-08 DIAGNOSIS — Z6841 Body Mass Index (BMI) 40.0 and over, adult: Secondary | ICD-10-CM

## 2015-04-08 DIAGNOSIS — Z9889 Other specified postprocedural states: Secondary | ICD-10-CM | POA: Diagnosis not present

## 2015-04-08 DIAGNOSIS — Z87891 Personal history of nicotine dependence: Secondary | ICD-10-CM | POA: Diagnosis not present

## 2015-04-08 DIAGNOSIS — Z880 Allergy status to penicillin: Secondary | ICD-10-CM | POA: Diagnosis not present

## 2015-04-08 DIAGNOSIS — I10 Essential (primary) hypertension: Secondary | ICD-10-CM | POA: Diagnosis present

## 2015-04-08 DIAGNOSIS — G4733 Obstructive sleep apnea (adult) (pediatric): Secondary | ICD-10-CM | POA: Diagnosis present

## 2015-04-08 DIAGNOSIS — Z9884 Bariatric surgery status: Secondary | ICD-10-CM | POA: Diagnosis not present

## 2015-04-08 DIAGNOSIS — R609 Edema, unspecified: Secondary | ICD-10-CM

## 2015-04-08 LAB — BASIC METABOLIC PANEL
Anion gap: 6 (ref 5–15)
BUN: 9 mg/dL (ref 6–20)
CO2: 27 mmol/L (ref 22–32)
Calcium: 9.9 mg/dL (ref 8.9–10.3)
Chloride: 108 mmol/L (ref 101–111)
Creatinine, Ser: 0.74 mg/dL (ref 0.61–1.24)
GFR calc Af Amer: >60 mL/min (ref >60)
GFR calc non Af Amer: >60 mL/min (ref >60)
Glucose, Bld: 96 mg/dL (ref 65–99)
Potassium: 4.2 mmol/L (ref 3.5–5.1)
Sodium: 141 mmol/L (ref 135–145)

## 2015-04-08 LAB — I-STAT TROPONIN, ED: Troponin i, poc: 0.03 ng/mL (ref 0.00–0.08)

## 2015-04-08 LAB — CBC
HCT: 41.6 % (ref 39.0–52.0)
Hemoglobin: 13.8 g/dL (ref 13.0–17.0)
MCH: 28.8 pg (ref 26.0–34.0)
MCHC: 33.2 g/dL (ref 30.0–36.0)
MCV: 86.8 fL (ref 78.0–100.0)
Platelets: 252 10*3/uL (ref 150–400)
RBC: 4.79 MIL/uL (ref 4.22–5.81)
RDW: 14.5 % (ref 11.5–15.5)
WBC: 13.3 10*3/uL — ABNORMAL HIGH (ref 4.0–10.5)

## 2015-04-08 LAB — PROTIME-INR
INR: 1.13 (ref 0.00–1.49)
Prothrombin Time: 14.7 seconds (ref 11.6–15.2)

## 2015-04-08 MED ORDER — MORPHINE SULFATE 4 MG/ML IJ SOLN
4.0000 mg | Freq: Once | INTRAMUSCULAR | Status: AC
  Administered 2015-04-08: 4 mg via INTRAVENOUS
  Filled 2015-04-08: qty 1

## 2015-04-08 MED ORDER — SODIUM CHLORIDE 0.9 % IJ SOLN: 3.0000 mL | Freq: Two times a day (BID) | INTRAMUSCULAR | Status: DC

## 2015-04-08 MED ORDER — ACETAMINOPHEN 650 MG RE SUPP: 650.0000 mg | Freq: Four times a day (QID) | RECTAL | Status: DC | PRN

## 2015-04-08 MED ORDER — ONDANSETRON HCL 4 MG PO TABS: 4.0000 mg | ORAL_TABLET | Freq: Four times a day (QID) | ORAL | Status: DC | PRN

## 2015-04-08 MED ORDER — HEPARIN (PORCINE) IN NACL 100-0.45 UNIT/ML-% IJ SOLN
2250.0000 [IU]/h | INTRAMUSCULAR | Status: DC
  Administered 2015-04-08: 1900 [IU]/h via INTRAVENOUS
  Administered 2015-04-09: 2250 [IU]/h via INTRAVENOUS
  Filled 2015-04-08 (×3): qty 250

## 2015-04-08 MED ORDER — HYDROMORPHONE HCL 1 MG/ML IJ SOLN
1.0000 mg | INTRAMUSCULAR | Status: DC | PRN
Start: 2015-04-08 — End: 2015-04-08

## 2015-04-08 MED ORDER — CALCIUM CARBONATE ANTACID 500 MG PO CHEW
500.0000 mg | CHEWABLE_TABLET | Freq: Three times a day (TID) | ORAL | Status: DC
Start: 2015-04-09 — End: 2015-04-09
  Administered 2015-04-09 (×2): 500 mg via ORAL
  Filled 2015-04-08 (×5): qty 1

## 2015-04-08 MED ORDER — ADULT MULTIVITAMIN W/MINERALS CH
1.0000 | ORAL_TABLET | Freq: Every day | ORAL | Status: DC
  Filled 2015-04-08: qty 1

## 2015-04-08 MED ORDER — ACETAMINOPHEN 325 MG PO TABS: 650.0000 mg | ORAL_TABLET | Freq: Four times a day (QID) | ORAL | Status: DC | PRN

## 2015-04-08 MED ORDER — ALUM & MAG HYDROXIDE-SIMETH 200-200-20 MG/5ML PO SUSP: 30.0000 mL | Freq: Four times a day (QID) | ORAL | Status: DC | PRN

## 2015-04-08 MED ORDER — ONDANSETRON HCL 4 MG/2ML IJ SOLN: 4.0000 mg | Freq: Four times a day (QID) | INTRAMUSCULAR | Status: DC | PRN

## 2015-04-08 MED ORDER — ONDANSETRON HCL 4 MG/2ML IJ SOLN
4.0000 mg | Freq: Once | INTRAMUSCULAR | Status: AC
  Administered 2015-04-08: 4 mg via INTRAVENOUS
  Filled 2015-04-08: qty 2

## 2015-04-08 MED ORDER — HEPARIN BOLUS VIA INFUSION
4000.0000 [IU] | Freq: Once | INTRAVENOUS | Status: AC
  Administered 2015-04-08: 4000 [IU] via INTRAVENOUS
  Filled 2015-04-08: qty 4000

## 2015-04-08 MED ORDER — IOHEXOL 350 MG/ML SOLN
100.0000 mL | Freq: Once | INTRAVENOUS | Status: AC | PRN
  Administered 2015-04-08: 80 mL via INTRAVENOUS

## 2015-04-08 NOTE — ED Notes (Signed)
Pt coming from outpatient CT with a positive PE result. Pt was sent to ED to be admitted for PE. Pt reports left sided chest pain with deep inspiration.

## 2015-04-08 NOTE — ED Notes (Signed)
MD at the bedside  

## 2015-04-08 NOTE — H&P (Signed)
History and Physical  Joseph E Binns ZJQ:[id number] DOB: 12/29/1975 DOA: 04/08/2015  Referring physician: Trixie Dredge, PA-C, ED provider PCP: Georgann Housekeeper, MD   Chief Complaint: Pulmonary embolism  HPI: Joseph Lang is a 39 y.o. male  With a history of obesity, hypertension, obstructive sleep apnea is 6 weeks post operative from a upper scopic gastric sleeve resection. Patient saw his primary care physician earlier this morning with chest pain and difficulty breathing that started yesterday. The patient had a workup which involved a d-dimer and a chest x-ray initially and then had a CTA which showed a pulmonary embolism. The patient's chest pain worsened with reclining and improved with standing. Shortness of breath worsened with activity. Overall, he feels that his symptoms are improving.  Additionally, he has been treated over the past 2 weeks for a right lower extremity cellulitis on his medial lower leg. He has been on Keflex. There has been some swelling of his complete lower extremity and firmness to the erythemic area   Review of Systems:   Pt denies any fevers, chills, nausea, vomiting, diarrhea, constipation, abdominal pain, headache, lightheadedness, melanoma, blood per rectum.  Review of systems are otherwise negative  Past Medical History  Diagnosis Date  . Obesity   . Hypertension   . Sleep apnea    Past Surgical History  Procedure Laterality Date  . Hip surgery    . Pelvis closed reduction    . Laparoscopic gastric sleeve resection N/A 02/14/2015    Procedure: LAPAROSCOPIC GASTRIC SLEEVE RESECTION WITH UPPER ENDOSCOPY;  Surgeon: Luretha Murphy, MD;  Location: [facility];  Service: General;  Laterality: N/A;   Social History:  reports that he quit smoking about 2 months ago. His smoking use included Cigarettes. He quit after 28 years of use. He does not have any smokeless tobacco history on file. He reports that he drinks alcohol. He reports that he does not use illicit  drugs. Patient lives at home & is able to participate in activities of daily living   Allergies  Allergen Reactions  . Amoxicillin Nausea And Vomiting    History reviewed. No pertinent family history. history includes obesity  Prior to Admission medications   Medication Sig Start Date End Date Taking? Authorizing Provider  PRESCRIPTION MEDICATION Take 5 mLs by mouth every 6 (six) hours. Cephalexin 250mg /51ml started on 03-28-15   Yes Historical Provider, MD  HYDROcodone-homatropine [facility]) 5-1.5 MG/5ML syrup Take 5 mLs by mouth every 6 (six) hours as needed. Patient not taking: Reported on 12/29/2014 07/18/14   Tonye Pearson, MD  meclizine (ANTIVERT) 25 MG tablet Take 1 tablet (25 mg total) by mouth 3 (three) times daily as needed for dizziness. Patient not taking: Reported on 12/29/2014 10/16/14   Blane Ohara, MD    Physical Exam: BP 170/77 mmHg  Pulse 76  Temp(Src) 98.1 F (36.7 C) (Oral)  Resp 23  Ht 6\' 2"  (1.88 m)  Wt 178.264 kg (393 lb)  BMI 50.44 kg/m2  SpO2 95%  General: Middle-age Caucasian male. Awake and alert and oriented x3. No acute cardiopulmonary distress.  Eyes: Pupils equal, round, reactive to light. Extraocular muscles are intact. Sclerae anicteric and noninjected.  ENT: Moist mucosal membranes. No mucosal lesions. Teeth in good repair  Neck: Neck supple without lymphadenopathy. No carotid bruits. No masses palpated.  Cardiovascular: Regular rate with normal S1-S2 sounds. No murmurs, rubs, gallops auscultated. No JVD.  Respiratory: Good respiratory effort with no wheezes, rales, rhonchi. Lungs clear to auscultation bilaterally.  Abdomen: Soft, nontender,  nondistended. Active bowel sounds. No masses or hepatosplenomegaly  Skin: Dry, warm to touch. 2+ dorsalis pedis and radial pulses. There is an area of firmness to his right medial lower extremity is approximately 4 cm in diameter there is no erythema, but there is nonpitting edema Musculoskeletal: No calf or  leg pain. All major joints not erythematous nontender.  Psychiatric: Intact judgment and insight.  Neurologic: No focal neurological deficits. Cranial nerves II through XII are grossly intact.           Labs on Admission:  Basic Metabolic Panel:  Recent Labs Lab 04/08/15 1951  NA 141  K 4.2  CL 108  CO2 27  GLUCOSE 96  BUN 9  CREATININE 0.74  CALCIUM 9.9   Liver Function Tests: No results for input(s): AST, ALT, ALKPHOS, BILITOT, PROT, ALBUMIN in the last 168 hours. No results for input(s): LIPASE, AMYLASE in the last 168 hours. No results for input(s): AMMONIA in the last 168 hours. CBC:  Recent Labs Lab 04/08/15 1951  WBC 13.3*  HGB 13.8  HCT 41.6  MCV 86.8  PLT 252   Cardiac Enzymes: No results for input(s): CKTOTAL, CKMB, CKMBINDEX, TROPONINI in the last 168 hours.  BNP (last 3 results) No results for input(s): BNP in the last 8760 hours.  ProBNP (last 3 results) No results for input(s): PROBNP in the last 8760 hours.  CBG: No results for input(s): GLUCAP in the last 168 hours.  Radiological Exams on Admission: Dg Chest 2 View  04/08/2015   CLINICAL DATA:  Left lower chest pain for 2 days, shortness of Breath  EXAM: CHEST  2 VIEW  COMPARISON:  09/01/2014  FINDINGS: Cardiomediastinal silhouette is stable. There is tiny left pleural effusion left basilar atelectasis or infiltrate. Mild degenerative changes thoracic spine. No pulmonary edema.  IMPRESSION: Tiny left pleural effusion with left basilar atelectasis or infiltrate. No pulmonary edema.   Electronically Signed   By: Natasha Mead M.D.   On: 04/08/2015 12:29   Ct Angio Chest Pe W/cm &/or Wo Cm  04/08/2015   ADDENDUM REPORT: 04/08/2015 19:15  ADDENDUM: Critical Value/emergent results were called by telephone at the time of interpretation on 04/08/2015 at 7:15 pm to Dr. Valentina Lucks, who verbally acknowledged these results.   Electronically Signed   By: Charline Bills M.D.   On: 04/08/2015 19:15   04/08/2015    CLINICAL DATA:  Left-sided chest pain, elevated D-dimer, GASTRIC SURGERY ON MAY 2ND  EXAM: CT ANGIOGRAPHY CHEST WITH CONTRAST  TECHNIQUE: Multidetector CT imaging of the chest was performed using the standard protocol during bolus administration of intravenous contrast. Multiplanar CT image reconstructions and MIPs were obtained to evaluate the vascular anatomy.  CONTRAST:  80mL OMNIPAQUE IOHEXOL 350 MG/ML SOLN  COMPARISON:  None.  FINDINGS: Limited evaluation due to poor bolus timing and respiratory motion. However, the study remains diagnostic.  Filling defect within the right main pulmonary artery (series 4/image 35). Additional subocclusive filling defects within branches of the right upper (series 4/ image 33) and lower lobe (series 4/images 41 and 46) pulmonary arteries.  Contralateral filling defects within segmental branches of the lingular (series 4/image 39) and left lower lobe pulmonary artery is (series 4/image 41). Possible subocclusive filling defect within branches of the left upper lobe pulmonary artery (series 4/image 33).  Overall clot burden is moderate. No findings to suggest right heart strain.  Mediastinum/Nodes: Heart is normal in size. No pericardial effusion.  Coronary atherosclerosis.  Mild atherosclerotic calcifications of the aortic arch.  No suspicious mediastinal, hilar, or axillary lymphadenopathy.  Visualized thyroid is unremarkable.  Lungs/Pleura: Evaluation of lung parenchyma is constrained by respiratory motion.  No suspicious pulmonary nodules.  Mild patchy opacity in the lingula and left lower lobe, possibly atelectasis. Small left pleural effusion.  No pneumothorax.  Upper abdomen: Visualized upper abdomen is notable for surgical changes involving the stomach.  Musculoskeletal: Degenerative changes of the visualized thoracolumbar spine.  Review of the MIP images confirms the above findings.  IMPRESSION: Multiple bilateral lobar and segmental pulmonary emboli, as described above.   Overall clot burden is moderate. No findings to suggest right heart strain.  Mild patchy opacity in the lingula and left lower lobe, possibly atelectasis. Small left pleural effusion.  Electronically Signed: By: Charline Bills M.D. On: 04/08/2015 19:06    Assessment/Plan Present on Admission:  . Pulmonary emboli . Right leg pain  This patient was discussed with the ED physician, including pertinent vitals, physical exam findings, labs, and imaging.  We also discussed care given by the ED provider.  Admit Heparin drip per pharmacy Telemetry monitoring Duplex right lower leg CBC in the morning  DVT prophylaxis: On heparin  Consultants: None  Code Status: Full code  Family Communication: None   Disposition Plan: Home following treatment on long-term anticoagulation  Levie Heritage, DO [facility] Pager [phone]

## 2015-04-08 NOTE — ED Provider Notes (Signed)
CSN: [id number]     Arrival date & time 04/08/15  1924 History   First MD Initiated Contact with Patient 04/08/15 2111     Chief Complaint  Patient presents with  . Chest Pain     (Consider location/radiation/quality/duration/timing/severity/associated sxs/prior Treatment) HPI   Patient sent to ED for admission for pulmonary embolism by PCP.  Pt reports left sided chest pain that began yesterday.  Pain is stabbing and pleuritic, gradually worsening.  Associated SOB.  Pain became unbearable overnight.  Has hx gastric sleeve surgery May 2 that is healing well but has also had right lower extremity swelling, pain, and redness for the past 2 weeks, being treated by PCP for cellulitis with keflex.  Denies fevers, recent illness.  Denies abdominal pain, vomiting, change in bowel habits.   No personal or family hx blood clots.   PCP Dr Elenora Gamma MDs General Surgeon Dr Daphine Deutscher   Past Medical History  Diagnosis Date  . Obesity   . Hypertension   . Sleep apnea    Past Surgical History  Procedure Laterality Date  . Hip surgery    . Pelvis closed reduction    . Laparoscopic gastric sleeve resection N/A 02/14/2015    Procedure: LAPAROSCOPIC GASTRIC SLEEVE RESECTION WITH UPPER ENDOSCOPY;  Surgeon: Luretha Murphy, MD;  Location: [facility];  Service: General;  Laterality: N/A;   History reviewed. No pertinent family history. History  Substance Use Topics  . Smoking status: Former Smoker -- 28 years    Types: Cigarettes    Quit date: 01/17/2015  . Smokeless tobacco: Not on file  . Alcohol Use: Yes     Comment: occ    Review of Systems  All other systems reviewed and are negative.     Allergies  Amoxicillin  Home Medications   Prior to Admission medications   Medication Sig Start Date End Date Taking? Authorizing Provider  PRESCRIPTION MEDICATION Take 5 mLs by mouth every 6 (six) hours. Cephalexin /63ml started on 03-28-15   Yes Historical Provider, MD  HYDROcodone-homatropine  [facility]) 5-1.5 MG/5ML syrup Take 5 mLs by mouth every 6 (six) hours as needed. Patient not taking: Reported on 12/29/2014 07/18/14   Tonye Pearson, MD  meclizine (ANTIVERT) 25 MG tablet Take 1 tablet (25 mg total) by mouth 3 (three) times daily as needed for dizziness. Patient not taking: Reported on 12/29/2014 10/16/14   Blane Ohara, MD   BP 156/85 mmHg  Pulse 79  Temp(Src) 98.1 F (36.7 C) (Oral)  Resp 26  Ht  (1.88 m)  Wt 393 lb (178.264 kg)  BMI 50.44 kg/m2  SpO2 95% Physical Exam  Constitutional: He appears well-developed and well-nourished. No distress.  HENT:  Head: Normocephalic and atraumatic.  Neck: Neck supple.  Cardiovascular: Normal rate, regular rhythm and intact distal pulses.   Pulmonary/Chest: Effort normal and breath sounds normal. No respiratory distress. He has no wheezes. He has no rales.  Abdominal: Soft. He exhibits no distension and no mass. There is no tenderness. There is no rebound and no guarding.  Morbidly obese.  Well healed surgical incisions over upper abdomen.  No erythema, edema, warmth, discharge, or tenderness   Musculoskeletal: He exhibits edema (Right lower extremity edematous, larger than left).  Neurological: He is alert. He exhibits normal muscle tone.  Skin: He is not diaphoretic.  Psychiatric: He has a normal mood and affect. His behavior is normal.  Nursing note and vitals reviewed.   ED Course  Procedures (including critical care time)  Labs Review Labs Reviewed  CBC - Abnormal; Notable for the following:    WBC 13.3 (*)    All other components within normal limits  BASIC METABOLIC PANEL  PROTIME-INR  Rosezena Sensor, ED    Imaging Review Dg Chest 2 View  04/08/2015   CLINICAL DATA:  Left lower chest pain for 2 days, shortness of Breath  EXAM: CHEST  2 VIEW  COMPARISON:  09/01/2014  FINDINGS: Cardiomediastinal silhouette is stable. There is tiny left pleural effusion left basilar atelectasis or infiltrate. Mild  degenerative changes thoracic spine. No pulmonary edema.  IMPRESSION: Tiny left pleural effusion with left basilar atelectasis or infiltrate. No pulmonary edema.   Electronically Signed   By: Natasha Mead M.D.   On: 04/08/2015 12:29   Ct Angio Chest Pe W/cm &/or Wo Cm  04/08/2015   ADDENDUM REPORT: 04/08/2015 19:15  ADDENDUM: Critical Value/emergent results were called by telephone at the time of interpretation on 04/08/2015 at 7:15 pm to Dr. Valentina Lucks, who verbally acknowledged these results.   Electronically Signed   By: Charline Bills M.D.   On: 04/08/2015 19:15   04/08/2015   CLINICAL DATA:  Left-sided chest pain, elevated D-dimer, GASTRIC SURGERY ON MAY 2ND  EXAM: CT ANGIOGRAPHY CHEST WITH CONTRAST  TECHNIQUE: Multidetector CT imaging of the chest was performed using the standard protocol during bolus administration of intravenous contrast. Multiplanar CT image reconstructions and MIPs were obtained to evaluate the vascular anatomy.  CONTRAST:  70mL OMNIPAQUE IOHEXOL 350 MG/ML SOLN  COMPARISON:  None.  FINDINGS: Limited evaluation due to poor bolus timing and respiratory motion. However, the study remains diagnostic.  Filling defect within the right main pulmonary artery (series 4/image 35). Additional subocclusive filling defects within branches of the right upper (series 4/ image 33) and lower lobe (series 4/images 41 and 46) pulmonary arteries.  Contralateral filling defects within segmental branches of the lingular (series 4/image 39) and left lower lobe pulmonary artery is (series 4/image 41). Possible subocclusive filling defect within branches of the left upper lobe pulmonary artery (series 4/image 33).  Overall clot burden is moderate. No findings to suggest right heart strain.  Mediastinum/Nodes: Heart is normal in size. No pericardial effusion.  Coronary atherosclerosis.  Mild atherosclerotic calcifications of the aortic arch.  No suspicious mediastinal, hilar, or axillary lymphadenopathy.   Visualized thyroid is unremarkable.  Lungs/Pleura: Evaluation of lung parenchyma is constrained by respiratory motion.  No suspicious pulmonary nodules.  Mild patchy opacity in the lingula and left lower lobe, possibly atelectasis. Small left pleural effusion.  No pneumothorax.  Upper abdomen: Visualized upper abdomen is notable for surgical changes involving the stomach.  Musculoskeletal: Degenerative changes of the visualized thoracolumbar spine.  Review of the MIP images confirms the above findings.  IMPRESSION: Multiple bilateral lobar and segmental pulmonary emboli, as described above.  Overall clot burden is moderate. No findings to suggest right heart strain.  Mild patchy opacity in the lingula and left lower lobe, possibly atelectasis. Small left pleural effusion.  Electronically Signed: By: Charline Bills M.D. On: 04/08/2015 19:06     EKG Interpretation None       ED ECG REPORT   Date: 04/09/2015  Rate: 75  Rhythm: normal sinus rhythm and premature ventricular contractions (PVC)  QRS Axis: normal  Intervals: normal  ST/T Wave abnormalities: nonspecific ST changes  Conduction Disutrbances:none  Narrative Interpretation:   Old EKG Reviewed: none available  I have personally reviewed the EKG tracing and agree with the computerized printout as noted.  MDM   Final diagnoses:  Pulmonary emboli    Afebrile nontoxic patient sent in by PCP for multiple pulmonary emboli with moderate clot burden.  Vital signs have been normal.  Not hypoxic.  Pt has had RLE edema for the past 1-2 weeks, suspect DVT.  Risk factor was recent abdominal surgery May 2.   Pt admitted to [facility], Dr Adrian Blackwater accepting.        [place], PA-C 04/09/15 [id number]  Derwood Kaplan, MD 04/09/15 2322

## 2015-04-08 NOTE — ED Notes (Signed)
Joseph Lang (mother) [phone] call mother if there's any changes, questions, or concerns.

## 2015-04-08 NOTE — Progress Notes (Signed)
ANTICOAGULATION CONSULT NOTE - Initial Consult  Pharmacy Consult for heparin Indication: pulmonary embolus  Allergies  Allergen Reactions  . Amoxicillin Nausea And Vomiting    Patient Measurements: Height: 6\' 2"  (188 cm) Weight: (!) 393 lb (178.264 kg) IBW/kg (Calculated) : 82.2 Heparin Dosing Weight: 125 kg  Vital Signs: Temp: 98.1 F (36.7 C) (06/24 1934) Temp Source: Oral (06/24 1934) BP: 156/85 mmHg (06/24 2115) Pulse Rate: 79 (06/24 2115)  Labs:  Recent Labs  04/08/15 1951  HGB 13.8  HCT 41.6  PLT 252  LABPROT 14.7  INR 1.13  CREATININE 0.74    Estimated Creatinine Clearance: 207.3 mL/min (by C-G formula based on Cr of 0.74).   Medical History: Past Medical History  Diagnosis Date  . Obesity   . Hypertension   . Sleep apnea     Medications:  See EMR   Assessment: 39 yo male with multiple bilateral PE, mild leukocytosis, h/h, platelets stable.    Goal of Therapy:  Heparin level 0.3-0.7 units/ml Monitor platelets by anticoagulation protocol: Yes   Plan:  -Heparin bolus 4000 units x1 then 1900 units/hr -Daily HL, CBC -First level with AM labs -Monitor s/sx bleeding   Agapito Games, PharmD, BCPS Clinical Pharmacist Pager: [phone] 04/08/2015 9:53 PM

## 2015-04-08 NOTE — ED Notes (Signed)
Patient arrived to room.

## 2015-04-09 ENCOUNTER — Inpatient Hospital Stay (HOSPITAL_COMMUNITY): Payer: BLUE CROSS/BLUE SHIELD

## 2015-04-09 DIAGNOSIS — I2699 Other pulmonary embolism without acute cor pulmonale: Principal | ICD-10-CM

## 2015-04-09 LAB — CBC
HCT: 41.6 % (ref 39.0–52.0)
Hemoglobin: 13.3 g/dL (ref 13.0–17.0)
MCH: 27.8 pg (ref 26.0–34.0)
MCHC: 32 g/dL (ref 30.0–36.0)
MCV: 86.8 fL (ref 78.0–100.0)
Platelets: 225 10*3/uL (ref 150–400)
RBC: 4.79 MIL/uL (ref 4.22–5.81)
RDW: 14.4 % (ref 11.5–15.5)
WBC: 11.8 10*3/uL — ABNORMAL HIGH (ref 4.0–10.5)

## 2015-04-09 LAB — HEPARIN LEVEL (UNFRACTIONATED): Heparin Unfractionated: 0.25 IU/mL — ABNORMAL LOW (ref 0.30–0.70)

## 2015-04-09 MED ORDER — RIVAROXABAN 15 MG PO TABS: 15.0000 mg | ORAL_TABLET | Freq: Two times a day (BID) | ORAL | Status: DC

## 2015-04-09 MED ORDER — RIVAROXABAN 20 MG PO TABS: 20.0000 mg | ORAL_TABLET | Freq: Every day | ORAL | Status: DC

## 2015-04-09 MED ORDER — HEPARIN BOLUS VIA INFUSION
3000.0000 [IU] | Freq: Once | INTRAVENOUS | Status: AC
  Administered 2015-04-09: 3000 [IU] via INTRAVENOUS
  Filled 2015-04-09: qty 3000

## 2015-04-09 MED ORDER — RIVAROXABAN (XARELTO) VTE STARTER PACK (15 & 20 MG): ORAL_TABLET | ORAL | Status: DC

## 2015-04-09 MED ORDER — RIVAROXABAN 15 MG PO TABS
15.0000 mg | ORAL_TABLET | Freq: Two times a day (BID) | ORAL | Status: DC
  Administered 2015-04-09: 15 mg via ORAL
  Filled 2015-04-09 (×2): qty 1

## 2015-04-09 NOTE — Progress Notes (Signed)
ANTICOAGULATION CONSULT NOTE - Follow-up  Pharmacy Consult for xarelto Indication: pulmonary embolus  Allergies  Allergen Reactions  . Amoxicillin Nausea And Vomiting    Patient Measurements: Height: 6\' 2"  (188 cm) Weight: (!) 392 lb 13.8 oz (178.2 kg) IBW/kg (Calculated) : 82.2 Heparin Dosing Weight: 125 kg  Vital Signs: Temp: 98 F (36.7 C) (06/25 0459) Temp Source: Oral (06/25 0459) BP: 140/79 mmHg (06/25 0459) Pulse Rate: 85 (06/25 0459)  Labs:  Recent Labs  04/08/15 1951 04/09/15 0503 04/09/15 0524  HGB 13.8 13.3  --   HCT 41.6 41.6  --   PLT 252 225  --   LABPROT 14.7  --   --   INR 1.13  --   --   HEPARINUNFRC  --   --  0.25*  CREATININE 0.74  --   --     Estimated Creatinine Clearance: 207.3 mL/min (by C-G formula based on Cr of 0.74).  Assessment: 39 yo male presented chest pain and difficulty breathing found to have multiple bilateral PE. Initially started on IV heparin but now transitioning to xarelto PO. H/H +  Platelets are WNL. No bleeding noted.   Goal of Therapy:  Therapeutic anticoagulation   Plan:  - Xarelto 15mg  PO BID x 21 days then 20mg  daily - F/u S&S of bleeding - Will plan to educate later today  Lysle Pearl, PharmD, BCPS Pager # [phone] 04/09/2015 10:09 AM

## 2015-04-09 NOTE — Progress Notes (Signed)
VASCULAR LAB PRELIMINARY  PRELIMINARY  PRELIMINARY  PRELIMINARY  Right lower extremity venous duplex completed.    Preliminary report:  Right:  No evidence of DVT, superficial thrombosis, or Baker's cyst.  Euphemia Lingerfelt, RVS 04/09/2015, 2:56 PM

## 2015-04-09 NOTE — Progress Notes (Signed)
Pt discharged home.  Alert and oriented x4.  No c/o pain or shob.  IV D/Cd Tele D/Cd.  Education given on diet, activity, meds and follow-up care and instructions.  Pt educated on xarelto and when to call physician or ems.  Pt verbalized understanding.  Pt taken home by parents.

## 2015-04-09 NOTE — Progress Notes (Signed)
ANTICOAGULATION CONSULT NOTE Pharmacy Consult for heparin Indication: pulmonary embolus  Allergies  Allergen Reactions  . Amoxicillin Nausea And Vomiting    Patient Measurements: Height: 6\' 2"  (188 cm) Weight: (!) 392 lb 13.8 oz (178.2 kg) IBW/kg (Calculated) : 82.2 Heparin Dosing Weight: 125 kg  Vital Signs: Temp: 98 F (36.7 C) (06/25 0459) Temp Source: Oral (06/25 0459) BP: 140/79 mmHg (06/25 0459) Pulse Rate: 85 (06/25 0459)  Labs:  Recent Labs  04/08/15 1951 04/09/15 0503 04/09/15 0524  HGB 13.8 13.3  --   HCT 41.6 41.6  --   PLT 252 225  --   LABPROT 14.7  --   --   INR 1.13  --   --   HEPARINUNFRC  --   --  0.25*  CREATININE 0.74  --   --     Estimated Creatinine Clearance: 207.3 mL/min (by C-G formula based on Cr of 0.74).  Assessment: 39 y.o. male with PE for heparin  Goal of Therapy:  Heparin level 0.3-0.7 units/ml Monitor platelets by anticoagulation protocol: Yes   Plan:  Heparin 3000 units IV bolus, then increase heparin 2250 units/hr Check heparin level in 6 hours.   Geannie Risen, PharmD, BCPS   04/09/2015 5:57 AM

## 2015-04-09 NOTE — Discharge Summary (Signed)
Physician Discharge Summary  Joseph E Geyer ZOX:[id number] DOB: [birth year] DOA: 04/08/2015  PCP: Georgann Housekeeper, MD  Admit date: 04/08/2015 Discharge date: 04/09/2015  Time spent: 35  minutes  Recommendations for Outpatient Follow-up:  1. Patient diagnosed with pulmonary embolism, was discharged on Xarelto 2. Please follow-up on patient's blood pressures, he had systolic blood pressures in the 160s on day of discharge however appeared anxious about wanting to leave the hospital. May need to be started on antihypertensives agents.  Discharge Diagnoses:  Active Problems:   Pulmonary emboli   Right leg pain   Discharge Condition: Stable  Diet recommendation: Heart healthy  Filed Weights   04/08/15 1934 04/08/15 2355 04/09/15 0001  Weight: 178.264 kg (393 lb) 178.2 kg (392 lb 13.8 oz) 178.2 kg (392 lb 13.8 oz)    History of present illness:  Joseph Lang is a 39 y.o. male  With a history of obesity, hypertension, obstructive sleep apnea is 6 weeks post operative from a upper scopic gastric sleeve resection. Patient saw his primary care physician earlier this morning with chest pain and difficulty breathing that started yesterday. The patient had a workup which involved a d-dimer and a chest x-ray initially and then had a CTA which showed a pulmonary embolism. The patient's chest pain worsened with reclining and improved with standing. Shortness of breath worsened with activity. Overall, he feels that his symptoms are improving. Additionally, he has been treated over the past 2 weeks for a right lower extremity cellulitis on his medial lower leg. He has been on Keflex. There has been some swelling of his complete lower extremity and firmness to the erythemic area  Hospital Course:  Patient is a pleasant 39 year old gentleman with a past medical history morbid obesity undergoing laparoscopic gastric sleeve resection on 02/14/2015. He reported remaining immobile 2 weeks following his  hospitalization while at home. He presented to the emergency department on 04/08/2059 with complaints of chest pain associated with shortness of breath. Initial workup revealed an elevated d-dimer that was followed up with a CT scan of lungs with IV contrast. This study revealed multiple bilateral lobar and segmental pulmonary emboli. Radiology reporting moderate clot burden. Patient was started on IV heparin. He remained hemodynamically stable. The following day he was quite adamant wanting to be discharged discharged, stating that he had minimal chest pain and shortness of breath. We discussed the risks and benefits of initiating oral anticoagulation. He verbalized understanding. He was discharged on a Xarelto starter pack. He was instructed to follow-up with his primary care provider within one week. Case management was consulted to assist patient with Xarelto. Patient was discharged in stable condition to his home on 04/09/2015.  Procedures:  Right lower extremity venous Dopplers showing no evidence of DVT, superficial thrombosis.  Consultations:  Pharmacy  Case manager  Discharge Exam: Filed Vitals:   04/09/15 1327  BP: 168/83  Pulse: 80  Temp: 97.5 F (36.4 C)  Resp:     General: Patient is in no acute distress, asking to go home today. States feeling well currently denies chest pain or shortness of breath. Cardiovascular: regular rate and rhythm normal S1-S2 no murmurs rubs or gallops Respiratory: normal respiratory effort, lungs are clear to auscultation bilaterally Abdomen: soft nontender nondistended Extremities: there is no edema or erythema involving right lower extremity  Discharge Instructions   Discharge Instructions    Call MD for:  difficulty breathing, headache or visual disturbances    Complete by:  As directed  Call MD for:  extreme fatigue    Complete by:  As directed      Call MD for:  hives    Complete by:  As directed      Call MD for:  persistant  dizziness or light-headedness    Complete by:  As directed      Call MD for:  persistant nausea and vomiting    Complete by:  As directed      Call MD for:  redness, tenderness, or signs of infection (pain, swelling, redness, odor or green/yellow discharge around incision site)    Complete by:  As directed      Call MD for:  severe uncontrolled pain    Complete by:  As directed      Call MD for:  temperature >100.4    Complete by:  As directed      Call MD for:    Complete by:  As directed      Diet - low sodium heart healthy    Complete by:  As directed      Increase activity slowly    Complete by:  As directed           Current Discharge Medication List    START taking these medications   Details  Rivaroxaban (XARELTO STARTER PACK) 15 & 20 MG TBPK Take as directed on package: Start with one  tablet by mouth twice a day with food. On Day 22, switch to one  tablet once a day with food. Qty: 51 each, Refills: 0    rivaroxaban (XARELTO) 20 MG TABS tablet Take 1 tablet (20 mg total) by mouth daily with supper. Qty: 30 tablet, Refills: 1      CONTINUE these medications which have NOT CHANGED   Details  meclizine (ANTIVERT) 25 MG tablet Take 1 tablet (25 mg total) by mouth 3 (three) times daily as needed for dizziness. Qty: 20 tablet, Refills: 0      STOP taking these medications     PRESCRIPTION MEDICATION      HYDROcodone-homatropine (HYCODAN) 5-1.5 MG/5ML syrup        Allergies  Allergen Reactions  . Amoxicillin Nausea And Vomiting   Follow-up Information    Follow up with Georgann Housekeeper, MD In 1 week.   Specialty:  Internal Medicine   Contact information:   [address] [place] [address] [phone]        The results of significant diagnostics from this hospitalization (including imaging, microbiology, ancillary and laboratory) are listed below for reference.    Significant Diagnostic Studies: Dg Chest 2 View  04/08/2015    CLINICAL DATA:  Left lower chest pain for 2 days, shortness of Breath  EXAM: CHEST  2 VIEW  COMPARISON:  09/01/2014  FINDINGS: Cardiomediastinal silhouette is stable. There is tiny left pleural effusion left basilar atelectasis or infiltrate. Mild degenerative changes thoracic spine. No pulmonary edema.  IMPRESSION: Tiny left pleural effusion with left basilar atelectasis or infiltrate. No pulmonary edema.   Electronically Signed   By: Natasha Mead M.D.   On: 04/08/2015 12:29   Ct Angio Chest Pe W/cm &/or Wo Cm  04/08/2015   ADDENDUM REPORT: 04/08/2015 19:15  ADDENDUM: Critical Value/emergent results were called by telephone at the time of interpretation on 04/08/2015 at 7:15 pm to Dr. Valentina Lucks, who verbally acknowledged these results.   Electronically Signed   By: Charline Bills M.D.   On: 04/08/2015 19:15   04/08/2015   CLINICAL  DATA:  Left-sided chest pain, elevated D-dimer, GASTRIC SURGERY ON MAY 2ND  EXAM: CT ANGIOGRAPHY CHEST WITH CONTRAST  TECHNIQUE: Multidetector CT imaging of the chest was performed using the standard protocol during bolus administration of intravenous contrast. Multiplanar CT image reconstructions and MIPs were obtained to evaluate the vascular anatomy.  CONTRAST:  4mL OMNIPAQUE IOHEXOL 350 MG/ML SOLN  COMPARISON:  None.  FINDINGS: Limited evaluation due to poor bolus timing and respiratory motion. However, the study remains diagnostic.  Filling defect within the right main pulmonary artery (series 4/image 35). Additional subocclusive filling defects within branches of the right upper (series 4/ image 33) and lower lobe (series 4/images 41 and 46) pulmonary arteries.  Contralateral filling defects within segmental branches of the lingular (series 4/image 39) and left lower lobe pulmonary artery is (series 4/image 41). Possible subocclusive filling defect within branches of the left upper lobe pulmonary artery (series 4/image 33).  Overall clot burden is moderate. No findings to suggest  right heart strain.  Mediastinum/Nodes: Heart is normal in size. No pericardial effusion.  Coronary atherosclerosis.  Mild atherosclerotic calcifications of the aortic arch.  No suspicious mediastinal, hilar, or axillary lymphadenopathy.  Visualized thyroid is unremarkable.  Lungs/Pleura: Evaluation of lung parenchyma is constrained by respiratory motion.  No suspicious pulmonary nodules.  Mild patchy opacity in the lingula and left lower lobe, possibly atelectasis. Small left pleural effusion.  No pneumothorax.  Upper abdomen: Visualized upper abdomen is notable for surgical changes involving the stomach.  Musculoskeletal: Degenerative changes of the visualized thoracolumbar spine.  Review of the MIP images confirms the above findings.  IMPRESSION: Multiple bilateral lobar and segmental pulmonary emboli, as described above.  Overall clot burden is moderate. No findings to suggest right heart strain.  Mild patchy opacity in the lingula and left lower lobe, possibly atelectasis. Small left pleural effusion.  Electronically Signed: By: Charline Bills M.D. On: 04/08/2015 19:06    Microbiology: No results found for this or any previous visit (from the past 240 hour(s)).   Labs: Basic Metabolic Panel:  Recent Labs Lab 04/08/15 1951  NA 141  K 4.2  CL 108  CO2 27  GLUCOSE 96  BUN 9  CREATININE 0.74  CALCIUM 9.9   Liver Function Tests: No results for input(s): AST, ALT, ALKPHOS, BILITOT, PROT, ALBUMIN in the last 168 hours. No results for input(s): LIPASE, AMYLASE in the last 168 hours. No results for input(s): AMMONIA in the last 168 hours. CBC:  Recent Labs Lab 04/08/15 1951 04/09/15 0503  WBC 13.3* 11.8*  HGB 13.8 13.3  HCT 41.6 41.6  MCV 86.8 86.8  PLT 252 225   Cardiac Enzymes: No results for input(s): CKTOTAL, CKMB, CKMBINDEX, TROPONINI in the last 168 hours. BNP: BNP (last 3 results) No results for input(s): BNP in the last 8760 hours.  ProBNP (last 3 results) No  results for input(s): PROBNP in the last 8760 hours.  CBG: No results for input(s): GLUCAP in the last 168 hours.     SignedJeralyn Bennett  [facility] 04/09/2015, 3:20 PM

## 2015-04-09 NOTE — Care Management Note (Signed)
Case Management Note  Patient Details  Name: [place] E Capili MRN: [id number] Date of Birth: [birth year]  Subjective/Objective:   Pt admitted for PE. Plan for home on xarelto.                 Action/Plan: CM did provide pt with Xarelto 30 day free card and zero co pay card. Pt uses [facility] on [address] and medication is available. Pt did state that his job would be ending soon and his insurance as well. CM did provide pt with the Patient Assistance Form for Xarelto as well to provide to his PCP. PCP office can fax to company. Pt is aware. No further needs from CM at this time.    Expected Discharge Date:                  Expected Discharge Plan:  Home/Self Care  In-House Referral:     Discharge planning Services  CM Consult, Medication Assistance  Post Acute Care Choice:  NA Choice offered to:  NA  DME Arranged:  N/A DME Agency:  NA  HH Arranged:  NA HH Agency:     Status of Service:  Completed, signed off  Medicare Important Message Given:  No Date Medicare IM Given:    Medicare IM give by:    Date Additional Medicare IM Given:    Additional Medicare Important Message give by:     If discussed at Long Length of Stay Meetings, dates discussed:    Additional Comments:  Gala Lewandowsky, RN 04/09/2015, 10:34 AM

## 2015-04-09 NOTE — Discharge Instructions (Signed)
Information on my medicine - XARELTO (rivaroxaban)  This medication education was reviewed with me or my healthcare representative as part of my discharge preparation.    WHY WAS XARELTO PRESCRIBED FOR YOU? Xarelto was prescribed to treat blood clots that may have been found in the veins of your legs (deep vein thrombosis) or in your lungs (pulmonary embolism) and to reduce the risk of them occurring again.  What do you need to know about Xarelto? The starting dose is one 15 mg tablet taken TWICE daily with food for the FIRST 21 DAYS then on 04/30/15  the dose is changed to one 20 mg tablet taken ONCE A DAY with your evening meal.  DO NOT stop taking Xarelto without talking to the health care provider who prescribed the medication.  Refill your prescription for 20 mg tablets before you run out.  After discharge, you should have regular check-up appointments with your healthcare provider that is prescribing your Xarelto.  In the future your dose may need to be changed if your kidney function changes by a significant amount.  What do you do if you miss a dose? If you are taking Xarelto TWICE DAILY and you miss a dose, take it as soon as you remember. You may take two 15 mg tablets (total 30 mg) at the same time then resume your regularly scheduled 15 mg twice daily the next day.  If you are taking Xarelto ONCE DAILY and you miss a dose, take it as soon as you remember on the same day then continue your regularly scheduled once daily regimen the next day. Do not take two doses of Xarelto at the same time.   Important Safety Information Xarelto is a blood thinner medicine that can cause bleeding. You should call your healthcare provider right away if you experience any of the following: ? Bleeding from an injury or your nose that does not stop. ? Unusual colored urine (red or dark brown) or unusual colored stools (red or black). ? Unusual bruising for unknown reasons. ? A serious fall or  if you hit your head (even if there is no bleeding).  Some medicines may interact with Xarelto and might increase your risk of bleeding while on Xarelto. To help avoid this, consult your healthcare provider or pharmacist prior to using any new prescription or non-prescription medications, including herbals, vitamins, non-steroidal anti-inflammatory drugs (NSAIDs) and supplements.  This website has more information on Xarelto: VisitDestination.com.br.

## 2015-04-11 ENCOUNTER — Ambulatory Visit: Payer: Self-pay | Admitting: Dietician

## 2015-04-11 ENCOUNTER — Other Ambulatory Visit: Payer: BLUE CROSS/BLUE SHIELD

## 2015-04-11 NOTE — Progress Notes (Signed)
Utilization review completed- post discharge 

## 2015-11-02 ENCOUNTER — Other Ambulatory Visit: Payer: Self-pay | Admitting: Internal Medicine

## 2015-11-02 ENCOUNTER — Ambulatory Visit
Admission: RE | Admit: 2015-11-02 | Discharge: 2015-11-02 | Disposition: A | Discharge status: Home / Self Care | Payer: BLUE CROSS/BLUE SHIELD | Source: Ambulatory Visit | Attending: Internal Medicine | Admitting: Internal Medicine

## 2015-11-02 DIAGNOSIS — R42 Dizziness and giddiness: Secondary | ICD-10-CM

## 2015-11-02 DIAGNOSIS — H538 Other visual disturbances: Secondary | ICD-10-CM

## 2015-11-04 ENCOUNTER — Other Ambulatory Visit: Payer: BLUE CROSS/BLUE SHIELD

## 2016-09-18 ENCOUNTER — Encounter (HOSPITAL_COMMUNITY): Payer: Self-pay

## 2016-10-01 IMAGING — CR DG CHEST 2V
2 series · 2 of 2 positions shown · non-contrast
Comparison: None.

CLINICAL DATA: One month history of cough.

EXAM:
CHEST  2 VIEW

[w chest pa]
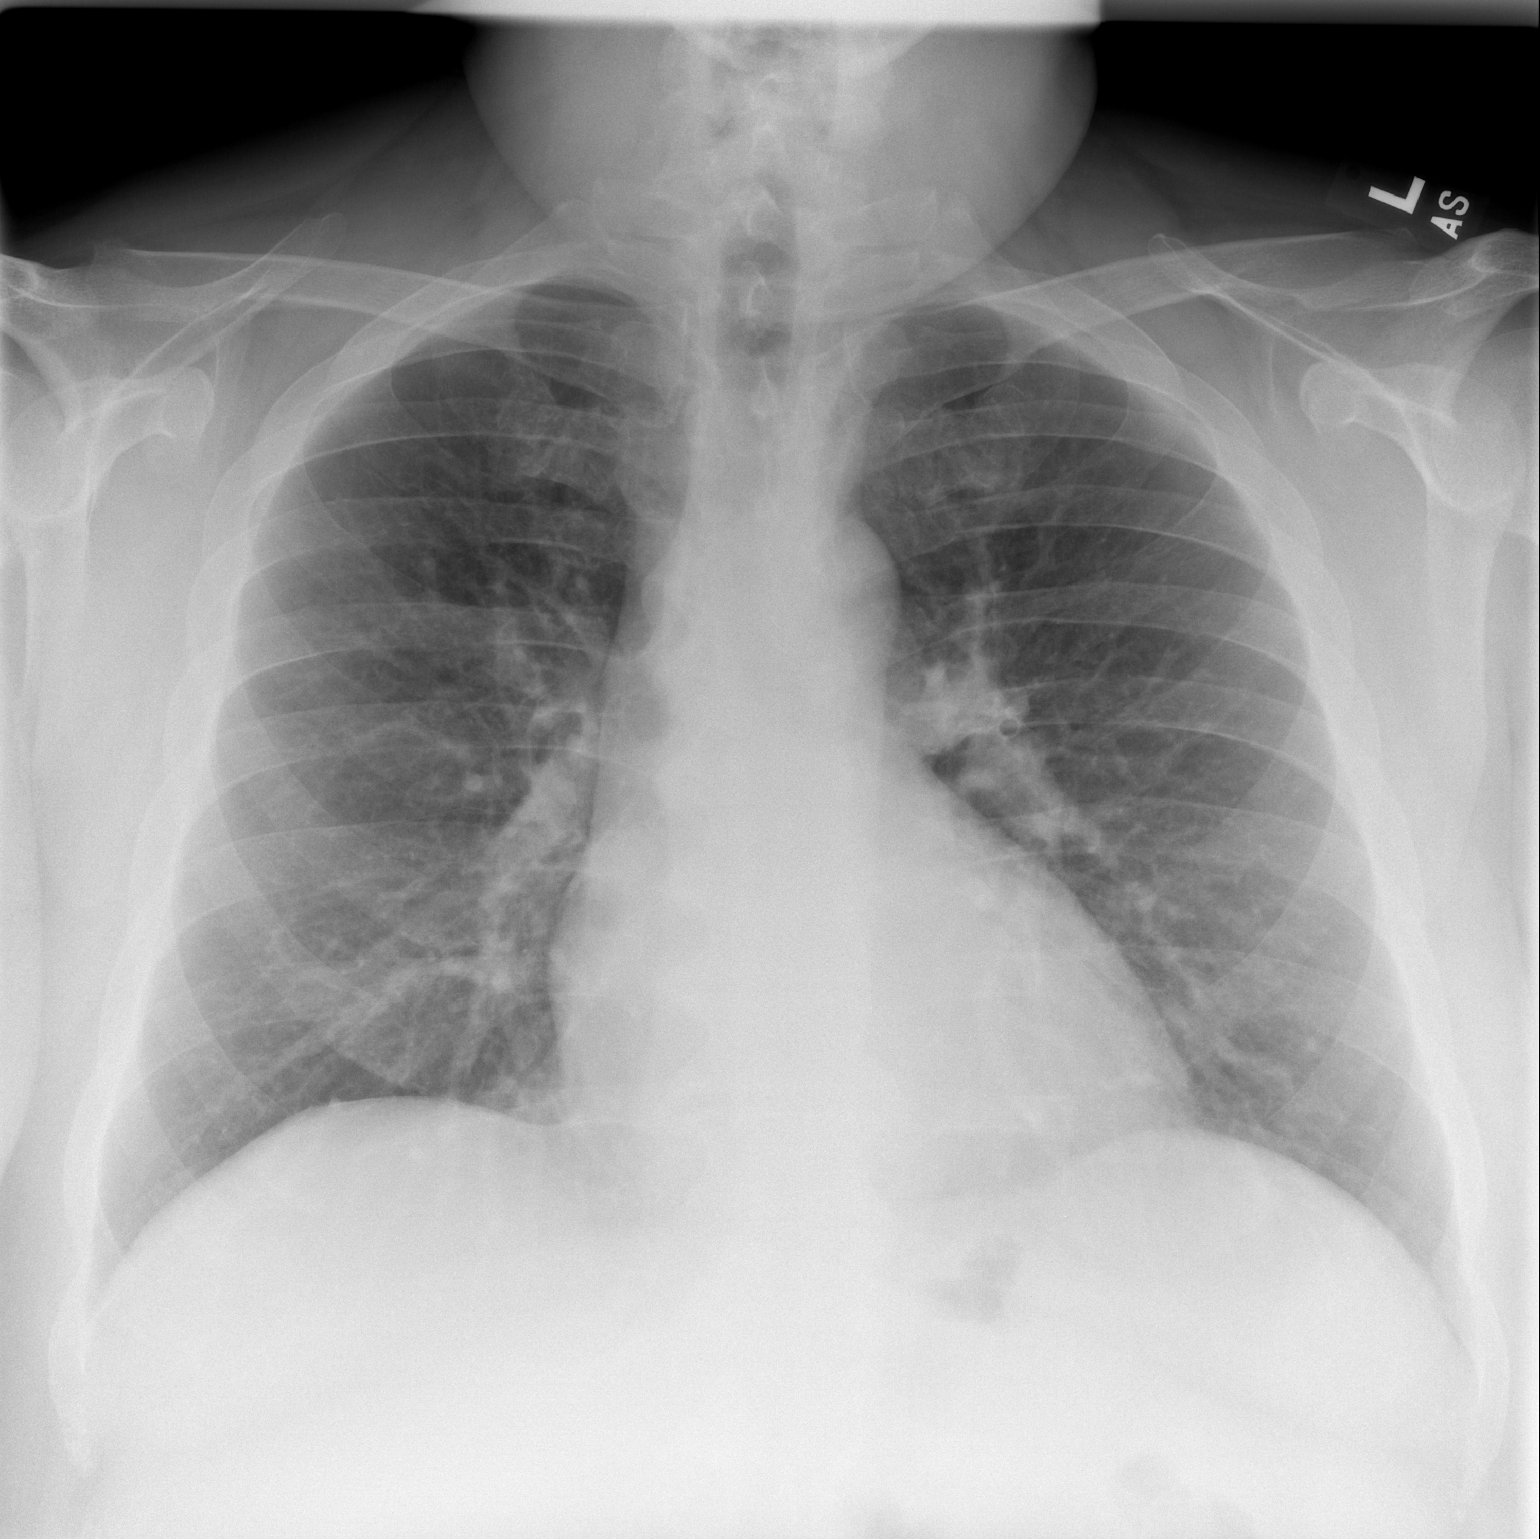

[w chest lat *]
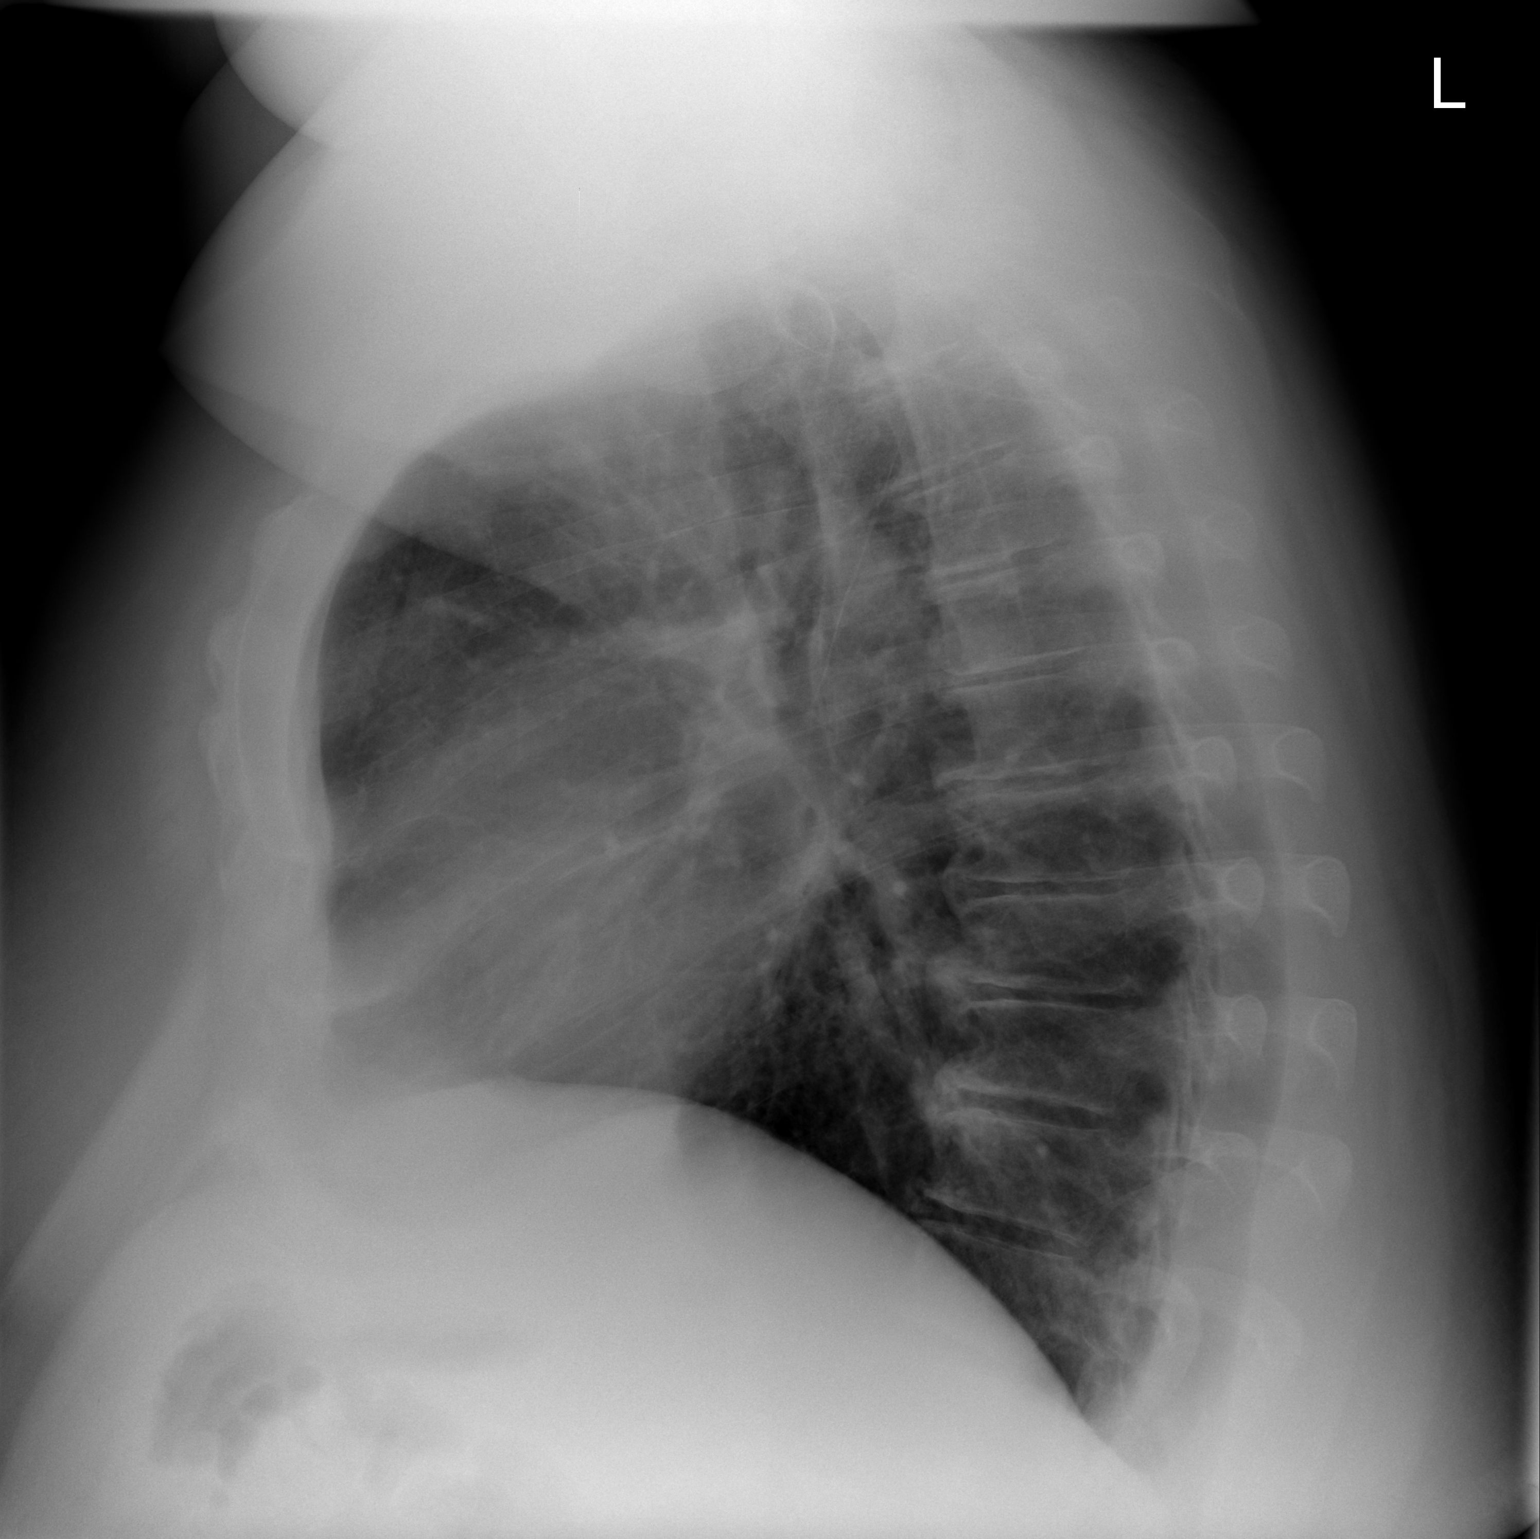

[2 of 2 positions shown; findings below may reference images not displayed]

FINDINGS: The cardiac silhouette, mediastinal and hilar contours are within
normal limits. The lungs are clear. No pleural effusion. The bony
thorax is intact.
IMPRESSION: No acute cardiopulmonary findings.

## 2016-12-04 ENCOUNTER — Ambulatory Visit (HOSPITAL_COMMUNITY)
Admission: RE | Admit: 2016-12-04 | Discharge: 2016-12-04 | Disposition: A | Discharge status: Home / Self Care | Payer: BLUE CROSS/BLUE SHIELD | Source: Ambulatory Visit | Attending: Nurse Practitioner | Admitting: Nurse Practitioner

## 2016-12-04 ENCOUNTER — Other Ambulatory Visit: Payer: Self-pay | Admitting: Nurse Practitioner

## 2016-12-04 DIAGNOSIS — R319 Hematuria, unspecified: Principal | ICD-10-CM

## 2016-12-04 DIAGNOSIS — N390 Urinary tract infection, site not specified: Secondary | ICD-10-CM | POA: Insufficient documentation

## 2016-12-04 DIAGNOSIS — N200 Calculus of kidney: Secondary | ICD-10-CM | POA: Diagnosis not present

## 2016-12-04 DIAGNOSIS — N201 Calculus of ureter: Secondary | ICD-10-CM | POA: Insufficient documentation

## 2016-12-04 DIAGNOSIS — I700 Atherosclerosis of aorta: Secondary | ICD-10-CM | POA: Diagnosis not present

## 2016-12-04 DIAGNOSIS — I2510 Atherosclerotic heart disease of native coronary artery without angina pectoris: Secondary | ICD-10-CM | POA: Diagnosis not present

## 2016-12-05 ENCOUNTER — Other Ambulatory Visit: Payer: BLUE CROSS/BLUE SHIELD

## 2016-12-15 ENCOUNTER — Emergency Department (HOSPITAL_COMMUNITY): Payer: BLUE CROSS/BLUE SHIELD

## 2016-12-15 ENCOUNTER — Emergency Department (HOSPITAL_COMMUNITY)
Admission: EM | Admit: 2016-12-15 | Discharge: 2016-12-15 | Disposition: A | Discharge status: Home / Self Care | Payer: BLUE CROSS/BLUE SHIELD | Attending: Emergency Medicine | Admitting: Emergency Medicine

## 2016-12-15 ENCOUNTER — Encounter (HOSPITAL_COMMUNITY): Payer: Self-pay | Admitting: Emergency Medicine

## 2016-12-15 DIAGNOSIS — R110 Nausea: Secondary | ICD-10-CM | POA: Diagnosis not present

## 2016-12-15 DIAGNOSIS — R1032 Left lower quadrant pain: Secondary | ICD-10-CM | POA: Diagnosis not present

## 2016-12-15 DIAGNOSIS — R21 Rash and other nonspecific skin eruption: Secondary | ICD-10-CM | POA: Insufficient documentation

## 2016-12-15 DIAGNOSIS — Z79899 Other long term (current) drug therapy: Secondary | ICD-10-CM | POA: Diagnosis not present

## 2016-12-15 DIAGNOSIS — I10 Essential (primary) hypertension: Secondary | ICD-10-CM | POA: Insufficient documentation

## 2016-12-15 DIAGNOSIS — Z7902 Long term (current) use of antithrombotics/antiplatelets: Secondary | ICD-10-CM | POA: Diagnosis not present

## 2016-12-15 DIAGNOSIS — Z87891 Personal history of nicotine dependence: Secondary | ICD-10-CM | POA: Insufficient documentation

## 2016-12-15 LAB — URINALYSIS, ROUTINE W REFLEX MICROSCOPIC
Bacteria, UA: NONE SEEN
Bilirubin Urine: NEGATIVE
Glucose, UA: NEGATIVE mg/dL
Ketones, ur: NEGATIVE mg/dL
Leukocytes, UA: NEGATIVE
Nitrite: NEGATIVE
Protein, ur: NEGATIVE mg/dL
Specific Gravity, Urine: 1.023 (ref 1.005–1.030)
WBC, UA: NONE SEEN WBC/hpf (ref 0–5)
pH: 5 (ref 5.0–8.0)

## 2016-12-15 LAB — CBC WITH DIFFERENTIAL/PLATELET
Basophils Absolute: 0 10*3/uL (ref 0.0–0.1)
Basophils Relative: 0 %
Eosinophils Absolute: 0.2 10*3/uL (ref 0.0–0.7)
Eosinophils Relative: 1 %
HCT: 42.2 % (ref 39.0–52.0)
Hemoglobin: 14.5 g/dL (ref 13.0–17.0)
Lymphocytes Relative: 11 %
Lymphs Abs: 1.9 10*3/uL (ref 0.7–4.0)
MCH: 30 pg (ref 26.0–34.0)
MCHC: 34.4 g/dL (ref 30.0–36.0)
MCV: 87.2 fL (ref 78.0–100.0)
Monocytes Absolute: 1.2 10*3/uL — ABNORMAL HIGH (ref 0.1–1.0)
Monocytes Relative: 7 %
Neutro Abs: 13.4 10*3/uL — ABNORMAL HIGH (ref 1.7–7.7)
Neutrophils Relative %: 81 %
Platelets: 264 10*3/uL (ref 150–400)
RBC: 4.84 MIL/uL (ref 4.22–5.81)
RDW: 13.1 % (ref 11.5–15.5)
WBC: 16.6 10*3/uL — ABNORMAL HIGH (ref 4.0–10.5)

## 2016-12-15 LAB — BASIC METABOLIC PANEL
Anion gap: 9 (ref 5–15)
BUN: 12 mg/dL (ref 6–20)
CO2: 24 mmol/L (ref 22–32)
Calcium: 10.1 mg/dL (ref 8.9–10.3)
Chloride: 108 mmol/L (ref 101–111)
Creatinine, Ser: 0.92 mg/dL (ref 0.61–1.24)
GFR calc Af Amer: >60 mL/min (ref >60)
GFR calc non Af Amer: >60 mL/min (ref >60)
Glucose, Bld: 113 mg/dL — ABNORMAL HIGH (ref 65–99)
Potassium: 3.9 mmol/L (ref 3.5–5.1)
Sodium: 141 mmol/L (ref 135–145)

## 2016-12-15 MED ORDER — KETOROLAC TROMETHAMINE 30 MG/ML IJ SOLN: 30.0000 mg | Freq: Once | INTRAMUSCULAR | Status: DC

## 2016-12-15 MED ORDER — OXYCODONE-ACETAMINOPHEN 5-325 MG PO TABS: 1.0000 | ORAL_TABLET | Freq: Four times a day (QID) | ORAL | 0 refills | Status: DC | PRN

## 2016-12-15 MED ORDER — OXYCODONE-ACETAMINOPHEN 5-325 MG PO TABS
2.0000 | ORAL_TABLET | Freq: Once | ORAL | Status: AC
  Administered 2016-12-15: 2 via ORAL
  Filled 2016-12-15: qty 2

## 2016-12-15 MED ORDER — TAMSULOSIN HCL 0.4 MG PO CAPS: 0.4000 mg | ORAL_CAPSULE | Freq: Every day | ORAL | 0 refills | Status: DC

## 2016-12-15 MED ORDER — SODIUM CHLORIDE 0.9 % IV BOLUS (SEPSIS)
1000.0000 mL | Freq: Once | INTRAVENOUS | Status: AC
  Administered 2016-12-15: 1000 mL via INTRAVENOUS

## 2016-12-15 MED ORDER — ONDANSETRON HCL 4 MG/2ML IJ SOLN
4.0000 mg | Freq: Once | INTRAMUSCULAR | Status: AC
  Administered 2016-12-15: 4 mg via INTRAVENOUS
  Filled 2016-12-15: qty 2

## 2016-12-15 MED ORDER — CLOTRIMAZOLE 1 % EX CREA: 1.0000 "application " | TOPICAL_CREAM | Freq: Two times a day (BID) | CUTANEOUS | 0 refills | Status: DC

## 2016-12-15 MED ORDER — ONDANSETRON HCL 4 MG PO TABS: 4.0000 mg | ORAL_TABLET | Freq: Four times a day (QID) | ORAL | 0 refills | Status: DC

## 2016-12-15 MED ORDER — MORPHINE SULFATE (PF) 4 MG/ML IV SOLN
4.0000 mg | Freq: Once | INTRAVENOUS | Status: AC
  Administered 2016-12-15: 4 mg via INTRAVENOUS
  Filled 2016-12-15: qty 1

## 2016-12-15 MED ORDER — HYDROMORPHONE HCL 1 MG/ML IJ SOLN
1.0000 mg | Freq: Once | INTRAMUSCULAR | Status: AC
  Administered 2016-12-15: 1 mg via INTRAVENOUS
  Filled 2016-12-15: qty 1

## 2016-12-15 NOTE — ED Notes (Signed)
2 IV attempted unsuccessful. Another RN to attempt IV.

## 2016-12-15 NOTE — ED Notes (Signed)
Bed: WA03 Expected date:  Expected time:  Means of arrival:  Comments: Triage 1 

## 2016-12-15 NOTE — Discharge Instructions (Signed)
Medications: Percocet, ibuprofen, Zofran, Flomax, clotrimazole  Treatment: Take 1-2 Percocet every 4-6 hours as needed for severe pain. You can take ibuprofen every 8 hours as needed for mild to moderate pain. Take Zofran every 6 hours as needed for nausea and vomiting. Take Flomax once daily after dinner. Make sure to drink plenty of water. Apply clotrimazole cream twice daily for 2 weeks to the area on your leg. Please follow-up with PCP for recheck at this time, as you may need further treatment.  Follow-up: Please follow-up with Dr. Berneice HeinrichManny, a urologist, within one week for follow-up of your symptoms. Please mention the findings on ultrasound below at your appointment. Please return to emergency department if you develop any new or worsening symptoms.  Scrotal ultrasound: 1. No evidence for testicular torsion or suspicious mass. 2. Small foci of probable fibrosis within the left testicle. 3. Left epididymal cyst. 4. Right varicocele. 5. Right scrotal pearl.

## 2016-12-15 NOTE — ED Provider Notes (Signed)
[facility]-EMERGENCY DEPT Provider Note   CSN: [id number] Arrival date & time: 12/15/16  1535     History   Chief Complaint Chief Complaint  Patient presents with  . Flank Pain    HPI Joseph Lang is a 41 y.o. male with history of kidney stones, pulmonary embolus who presents with a five-hour history of left groin pain and dysuria. Patient has had associated nausea, but no vomiting. He states that his urine is like a "dribble." Patient had a recent CT scan on 12/04/2016 which showed a string of note instructing stones in the left kidney. He also had a right ureteral stone at that time and had right groin pain. Patient is concerned as well because he felt like his left testicle with missing when he went to bathroom after arrival. Patient is taking Toradol at home without relief. Patient denies any chest pain, shortness of breath, fevers, abdominal pain.  HPI  Past Medical History:  Diagnosis Date  . Hypertension   . Obesity   . Sleep apnea     Patient Active Problem List   Diagnosis Date Noted  . Pulmonary emboli (HCC) 04/08/2015  . Right leg pain 04/08/2015  . S/P laparoscopic sleeve gastrectomy May 2016 02/14/2015  . BMI 50.0-59.9, adult (HCC) 07/18/2014    Past Surgical History:  Procedure Laterality Date  . HIP SURGERY    . LAPAROSCOPIC GASTRIC SLEEVE RESECTION N/A 02/14/2015   Procedure: LAPAROSCOPIC GASTRIC SLEEVE RESECTION WITH UPPER ENDOSCOPY;  Surgeon: Luretha Murphy, MD;  Location: [facility];  Service: General;  Laterality: N/A;  . PELVIS CLOSED REDUCTION         Home Medications    Prior to Admission medications   Medication Sig Start Date End Date Taking? Authorizing Provider  clotrimazole (LOTRIMIN) 1 % cream Apply 1 application topically 2 (two) times daily. 12/15/16   Emi Holes, PA-C  meclizine (ANTIVERT) 25 MG tablet Take 1 tablet (25 mg total) by mouth 3 (three) times daily as needed for dizziness. Patient not taking: Reported on 12/29/2014 10/16/14   Blane Ohara, MD  ondansetron (ZOFRAN) 4 MG tablet Take 1 tablet (4 mg total) by mouth every 6 (six) hours. 12/15/16   Emi Holes, PA-C  oxyCODONE-acetaminophen (PERCOCET/ROXICET) 5-325 MG tablet Take 1-2 tablets by mouth every 6 (six) hours as needed for severe pain. 12/15/16   Emi Holes, PA-C  Rivaroxaban (XARELTO STARTER PACK) 15 & 20 MG TBPK Take as directed on package: Start with one 15mg  tablet by mouth twice a day with food. On Day 22, switch to one 20mg  tablet once a day with food. 04/09/15   Jeralyn Bennett, MD  rivaroxaban (XARELTO) 20 MG TABS tablet Take 1 tablet (20 mg total) by mouth daily with supper. 04/30/15   Jeralyn Bennett, MD  tamsulosin (FLOMAX) 0.4 MG CAPS capsule Take 1 capsule (0.4 mg total) by mouth daily after supper. 12/15/16   Emi Holes, PA-C    Family History No family history on file.  Social History Social History  Substance Use Topics  . Smoking status: Former Smoker    Years: 28.00    Types: Cigarettes    Quit date: 01/17/2015  . Smokeless tobacco: Not on file  . Alcohol use Yes     Comment: occ     Allergies   Amoxicillin   Review of Systems Review of Systems  Constitutional: Negative for chills and fever.  HENT: Negative for facial swelling and sore throat.   Respiratory: Negative for  shortness of breath.   Cardiovascular: Negative for chest pain.  Gastrointestinal: Positive for nausea. Negative for abdominal pain and vomiting.  Genitourinary: Positive for dysuria and testicular pain. Negative for flank pain.  Musculoskeletal: Negative for back pain.  Skin: Negative for rash and wound.  Neurological: Negative for headaches.  Psychiatric/Behavioral: The patient is not nervous/anxious.      Physical Exam Updated Vital Signs BP 148/87   Pulse (!) 56   Temp 98 F (36.7 C) (Oral)   Resp 16   Wt (!) 177.8 kg   SpO2 96%   BMI 50.33 kg/m   Physical Exam  Constitutional: He appears well-developed and well-nourished. No distress.    HENT:  Head: Normocephalic and atraumatic.  Mouth/Throat: Oropharynx is clear and moist. No oropharyngeal exudate.  Eyes: Conjunctivae are normal. Pupils are equal, round, and reactive to light. Right eye exhibits no discharge. Left eye exhibits no discharge. No scleral icterus.  Neck: Normal range of motion. Neck supple. No thyromegaly present.  Cardiovascular: Normal rate, regular rhythm, normal heart sounds and intact distal pulses.  Exam reveals no gallop and no friction rub.   No murmur heard. Pulmonary/Chest: Effort normal and breath sounds normal. No stridor. No respiratory distress. He has no wheezes. He has no rales.  Abdominal: Soft. Bowel sounds are normal. He exhibits no distension. There is no tenderness. There is no rebound and no guarding.  Genitourinary: Right testis shows no mass, no swelling and no tenderness. Left testis shows tenderness. Left testis shows no mass and no swelling.  Genitourinary Comments: No inguinal tenderness other than some L testicular pain, no masses felt  Musculoskeletal: He exhibits no edema.  Lymphadenopathy:    He has no cervical adenopathy.  Neurological: He is alert. Coordination normal.  Skin: Skin is warm and dry. No rash noted. He is not diaphoretic. No pallor.  Psychiatric: He has a normal mood and affect.  Nursing note and vitals reviewed.      ED Treatments / Results  Labs (all labs ordered are listed, but only abnormal results are displayed) Labs Reviewed  URINALYSIS, ROUTINE W REFLEX MICROSCOPIC - Abnormal; Notable for the following:       Result Value   APPearance HAZY (*)    Hgb urine dipstick LARGE (*)    Squamous Epithelial / LPF 0-5 (*)    All other components within normal limits  BASIC METABOLIC PANEL - Abnormal; Notable for the following:    Glucose, Bld 113 (*)    All other components within normal limits  CBC WITH DIFFERENTIAL/PLATELET - Abnormal; Notable for the following:    WBC 16.6 (*)    Neutro Abs 13.4 (*)     Monocytes Absolute 1.2 (*)    All other components within normal limits    EKG  EKG Interpretation None       Radiology [place] Scrotum  Result Date: 12/15/2016 CLINICAL DATA:  Left testicular pain since 11:30 a.m. EXAM: SCROTAL ULTRASOUND DOPPLER ULTRASOUND OF THE TESTICLES TECHNIQUE: Complete ultrasound examination of the testicles, epididymis, and other scrotal structures was performed. Color and spectral Doppler ultrasound were also utilized to evaluate blood flow to the testicles. COMPARISON:  None. FINDINGS: Right testicle Measurements: 3.4 x 1.8 x 2.9 cm. No mass or microlithiasis visualized. A capsular calcification measures 1 mm, consistent with scrotal pearl. Left testicle Measurements: 3.3 x 1.5 x 2.3 cm. There are 2 small echogenic foci within the left testis, not associated with acoustic shadowing. Largest measures 2 mm in diameter. Findings  are consistent benign process, likely foci of fibrosis. No suspicious mass identified. Right epididymis:  Normal in size and appearance. Left epididymis: Small cyst is identified in the left epididymal body measuring 0.4 x 0.1 x 0.4 cm. Hydrocele:  None visualized. Varicocele:  Right varicocele present. Pulsed Doppler interrogation of both testes demonstrates normal low resistance arterial and venous waveforms bilaterally. IMPRESSION: 1. No evidence for testicular torsion or suspicious mass. 2. Small foci of probable fibrosis within the left testicle. 3. Left epididymal cyst. 4. Right varicocele. 5. Right scrotal pearl. Electronically Signed   By: Norva Pavlov M.D.   On: 12/15/2016 18:29   US Renal  Result Date: 12/15/2016 CLINICAL DATA:  Left hydronephrosis and flank pain since 11:30 a.m. EXAM: RENAL / URINARY TRACT ULTRASOUND COMPLETE COMPARISON:  CT of the abdomen and pelvis 12/04/2016 FINDINGS: Right Kidney: Length: 13.1 cm. Echogenicity within normal limits. No mass or hydronephrosis visualized. Left Kidney: Length: 13.5 cm. Echogenicity  within normal limits. No mass or hydronephrosis visualized. Bladder: Appears normal for degree of bladder distention. IMPRESSION: Normal evaluation of the kidneys and bladder. Electronically Signed   By: Norva Pavlov M.D.   On: 12/15/2016 18:14   [place] Art/ven Flow Abd Pelv Doppler  Result Date: 12/15/2016 CLINICAL DATA:  Left testicular pain since 11:30 a.m. EXAM: SCROTAL ULTRASOUND DOPPLER ULTRASOUND OF THE TESTICLES TECHNIQUE: Complete ultrasound examination of the testicles, epididymis, and other scrotal structures was performed. Color and spectral Doppler ultrasound were also utilized to evaluate blood flow to the testicles. COMPARISON:  None. FINDINGS: Right testicle Measurements: 3.4 x 1.8 x 2.9 cm. No mass or microlithiasis visualized. A capsular calcification measures 1 mm, consistent with scrotal pearl. Left testicle Measurements: 3.3 x 1.5 x 2.3 cm. There are 2 small echogenic foci within the left testis, not associated with acoustic shadowing. Largest measures 2 mm in diameter. Findings are consistent benign process, likely foci of fibrosis. No suspicious mass identified. Right epididymis:  Normal in size and appearance. Left epididymis: Small cyst is identified in the left epididymal body measuring 0.4 x 0.1 x 0.4 cm. Hydrocele:  None visualized. Varicocele:  Right varicocele present. Pulsed Doppler interrogation of both testes demonstrates normal low resistance arterial and venous waveforms bilaterally. IMPRESSION: 1. No evidence for testicular torsion or suspicious mass. 2. Small foci of probable fibrosis within the left testicle. 3. Left epididymal cyst. 4. Right varicocele. 5. Right scrotal pearl. Electronically Signed   By: Norva Pavlov M.D.   On: 12/15/2016 18:29    Procedures Procedures (including critical care time)  Medications Ordered in ED Medications  sodium chloride 0.9 % bolus 1,000 mL (0 mLs Intravenous Stopped 12/15/16 1906)  ondansetron (ZOFRAN) injection 4 mg (4 mg  Intravenous Given 12/15/16 1709)  morphine 4 MG/ML injection 4 mg (4 mg Intravenous Given 12/15/16 1709)  HYDROmorphone (DILAUDID) injection 1 mg (1 mg Intravenous Given 12/15/16 1745)  oxyCODONE-acetaminophen (PERCOCET/ROXICET) 5-325 MG per tablet 2 tablet (2 tablets Oral Given 12/15/16 1907)     Initial Impression / Assessment and Plan / ED Course  I have reviewed the triage vital signs and the nursing notes.  Pertinent labs & imaging results that were available during my care of the patient were reviewed by me and considered in my medical decision making (see chart for details).     Suspect kidney stone, but will have scrotal ultrasound to rule out testicular torsion. Also renal ultrasound to rule out hydronephrosis.  Following ultrasounds, patient requesting more pain medication. Will order 1 mg Dilaudid.  On reevaluation patient is found sleeping and when he woke up, states he is much more comfortable and pain and nausea are relieved.  CBC shows WBC 16.6. BMP shows unremarkable. UA shows large hematuria. Renal ultrasound normal. Scrotal ultrasound shows no evidence for testicular torsion or suspicious mass; small foci of probable fibrosis within the left testicle; left epididymal cyst; right varicocele; right scrotal pearl. Considering stones found on CT scan on 12/04/2016, suspect stone. Pain and nausea controlled in ED. at discharge, patient mentioned as an area on his leg that has been present for the past 2-3 months and has been treated with topical and oral antibiotics. Patient states that it began itching and reappearing 2 days ago. See photo above. Follow-up to PCP and will add clotrimazole for possible fungal etiology. Will discharge home with Flomax, Percocet, ibuprofen, Zofran. I reviewed the narcotic database and found no discrepancies. Follow-up to urology. Patient also evaluated by Dr. Denton Lank who guided the patient's management and agrees with plan. Patient vitals stable throughout ED  course and discharged in satisfactory condition.  Final Clinical Impressions(s) / ED Diagnoses   Final diagnoses:  Left inguinal pain  Nausea  Rash    New Prescriptions Discharge Medication List as of 12/15/2016  7:33 PM    START taking these medications   Details  clotrimazole (LOTRIMIN) 1 % cream Apply 1 application topically 2 (two) times daily., Starting Sat 12/15/2016, Print    ondansetron (ZOFRAN) 4 MG tablet Take 1 tablet (4 mg total) by mouth every 6 (six) hours., Starting Sat 12/15/2016, Print    oxyCODONE-acetaminophen (PERCOCET/ROXICET) 5-325 MG tablet Take 1-2 tablets by mouth every 6 (six) hours as needed for severe pain., Starting Sat 12/15/2016, Print    tamsulosin (FLOMAX) 0.4 MG CAPS capsule Take 1 capsule (0.4 mg total) by mouth daily after supper., Starting Sat 12/15/2016, Print         Emi Holes, PA-C 12/15/16 2107    Cathren Laine, MD 12/16/16 [phone]

## 2016-12-15 NOTE — ED Notes (Addendum)
Ultrasound at bedside. Will obtain vital signs after procedure.

## 2016-12-15 NOTE — ED Triage Notes (Addendum)
Pt complaint of left flank pain onset 1130 today;v minimal urine output today but "able able to pass some;" hx of kidney stones. Pt took 2 tablets of tramadol PTA.

## 2017-01-16 IMAGING — RF DG UGI W/ KUB
14 series · 14 of 14 positions shown · non-contrast
Comparison: None.

CLINICAL DATA: Bariatric screening, acid reflux

EXAM:
UPPER GI SERIES WITH KUB
TECHNIQUE: After obtaining a scout radiograph a routine upper GI series was
performed using thin barium
FLUOROSCOPY TIME:  Fluoroscopy Time (in minutes and seconds): 60
seconds
Number of Acquired Images:  12

[Series 1: abdomen kub · 0.14mm/px · 1 of 1 slices shown (1 of 2)]
[im 1/1]
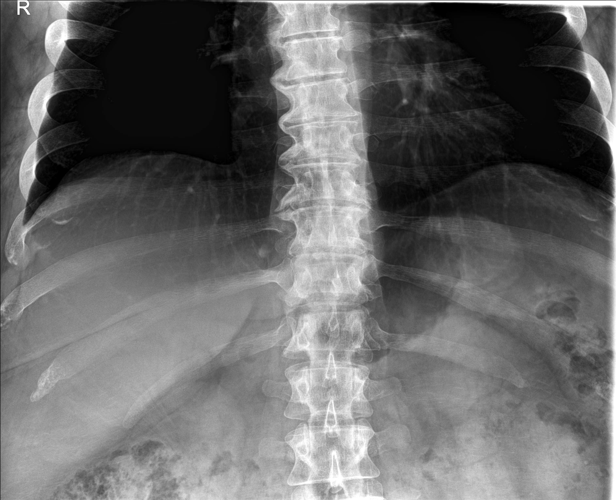

[Series 1: run · 1 of 1 slices shown (1 of 12)]
[im 1/1]
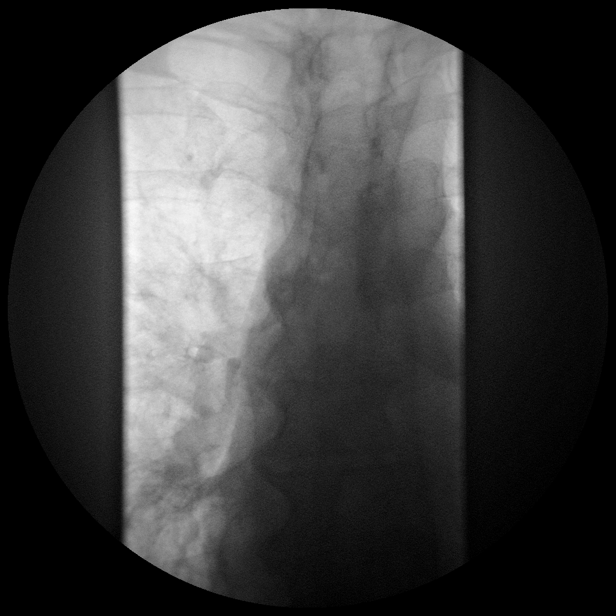

[Series 2: run · 1 of 1 slices shown (2 of 12)]
[im 1/1]
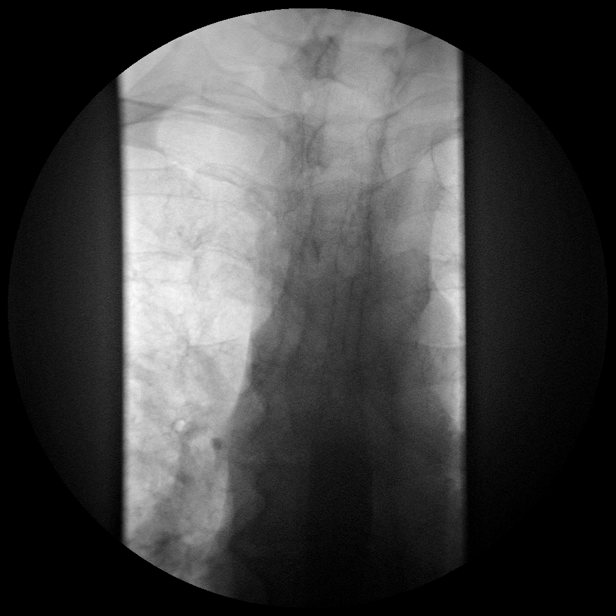

[Series 2: abdomen kub · 0.14mm/px · 1 of 1 slices shown (2 of 2)]
[im 1/1]
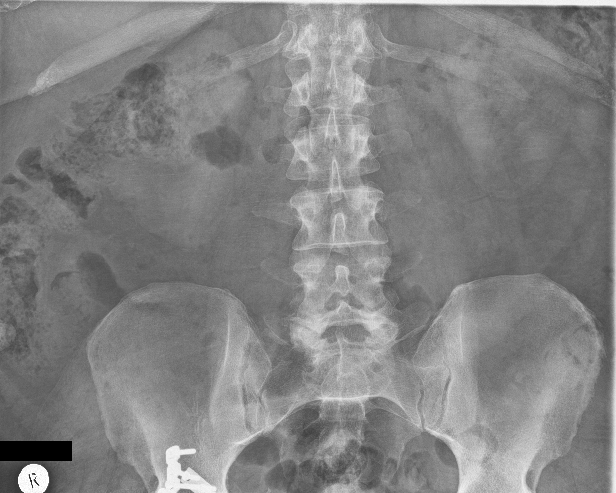

[Series 3: run · 1 of 1 slices shown (3 of 12)]
[im 1/1]
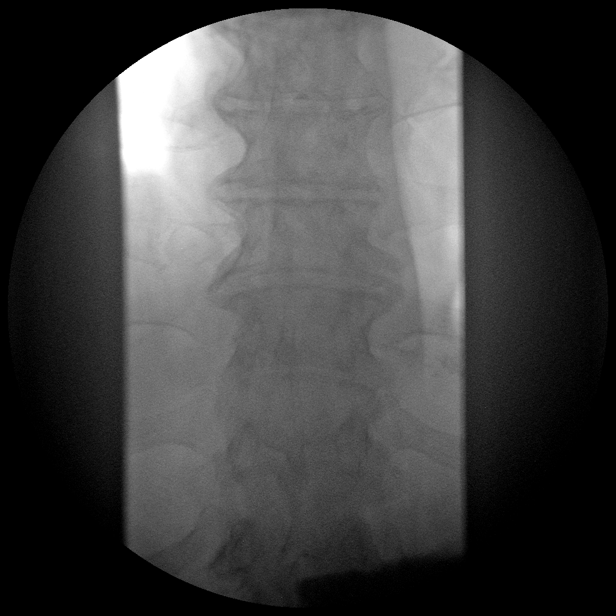

[Series 4: run · 1 of 1 slices shown (4 of 12)]
[im 1/1]
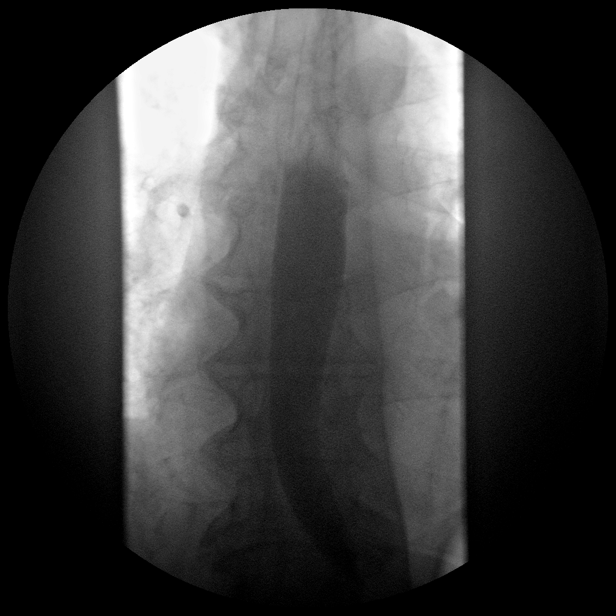

[Series 5: run · 1 of 1 slices shown (5 of 12)]
[im 1/1]
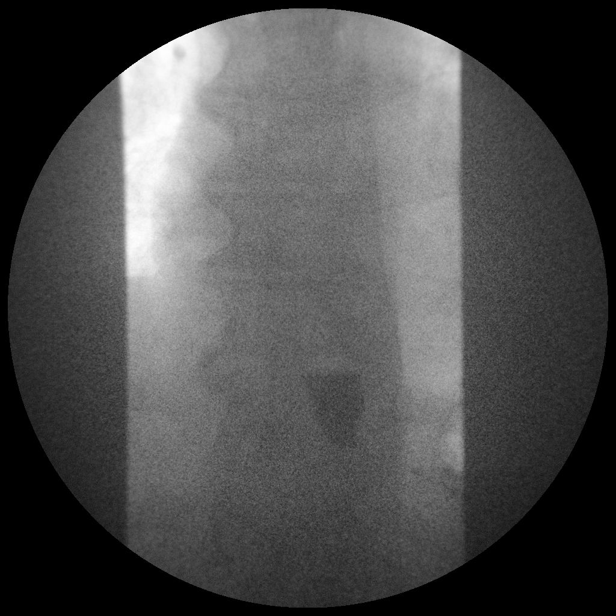

[Series 6: run · 1 of 1 slices shown (6 of 12)]
[im 1/1]
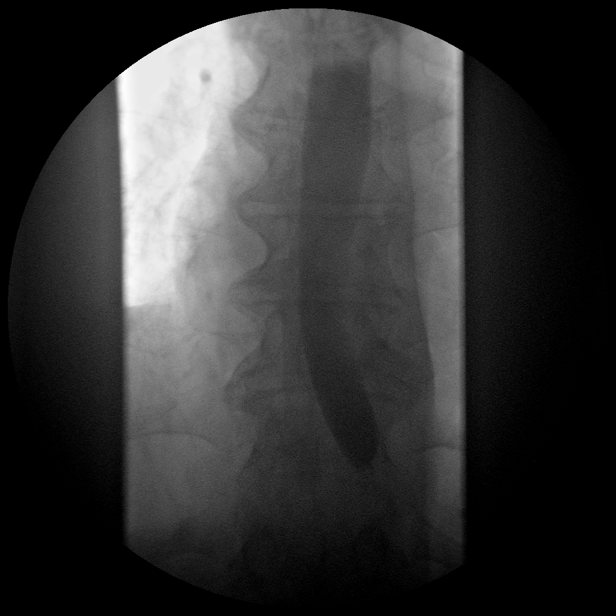

[Series 7: run · 1 of 1 slices shown (7 of 12)]
[im 1/1]
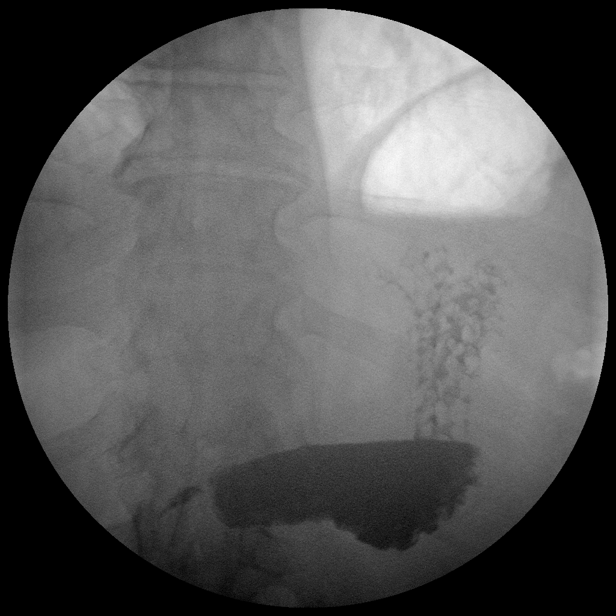

[Series 8: run · 1 of 1 slices shown (8 of 12)]
[im 1/1]
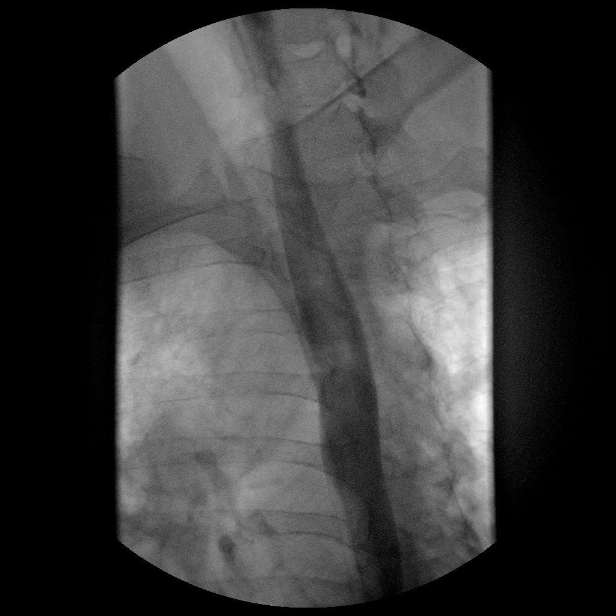

[Series 9: run · 1 of 1 slices shown (9 of 12)]
[im 1/1]
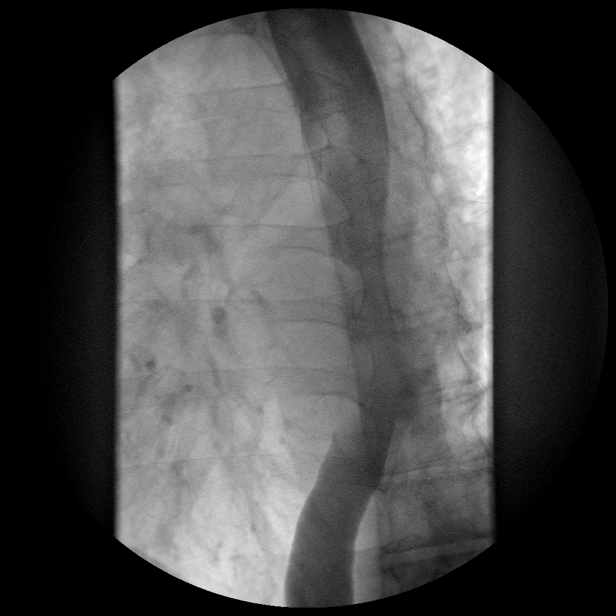

[Series 10: run · 1 of 1 slices shown (10 of 12)]
[im 1/1]
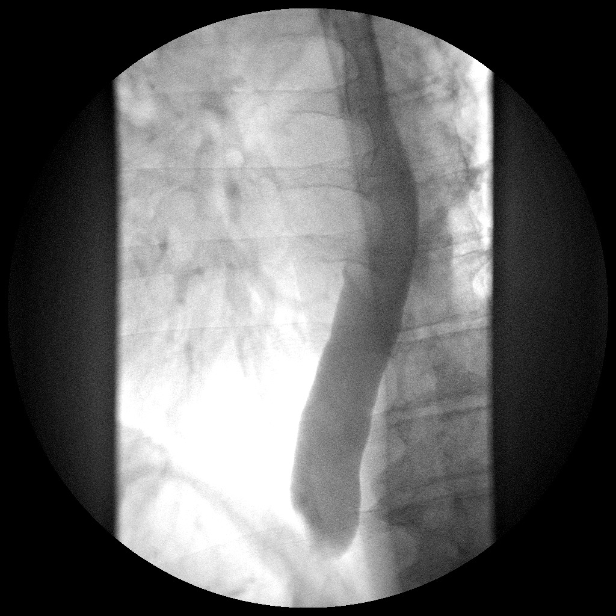

[Series 11: run · 1 of 1 slices shown (11 of 12)]
[im 1/1]
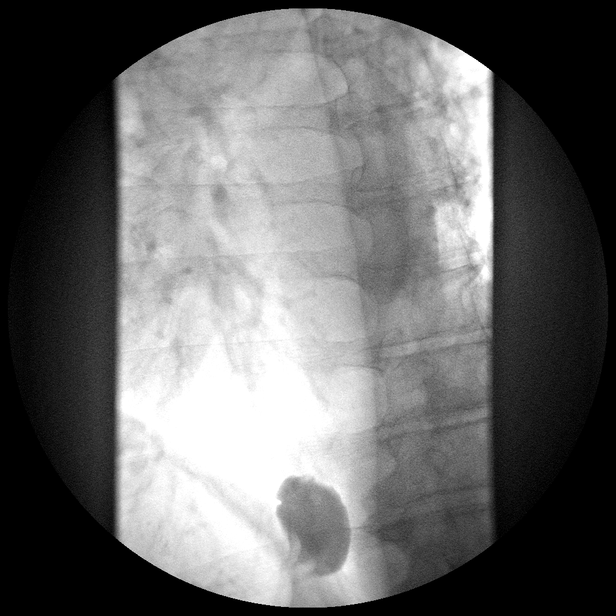

[Series 12: run · 1 of 1 slices shown (12 of 12)]
[im 1/1]
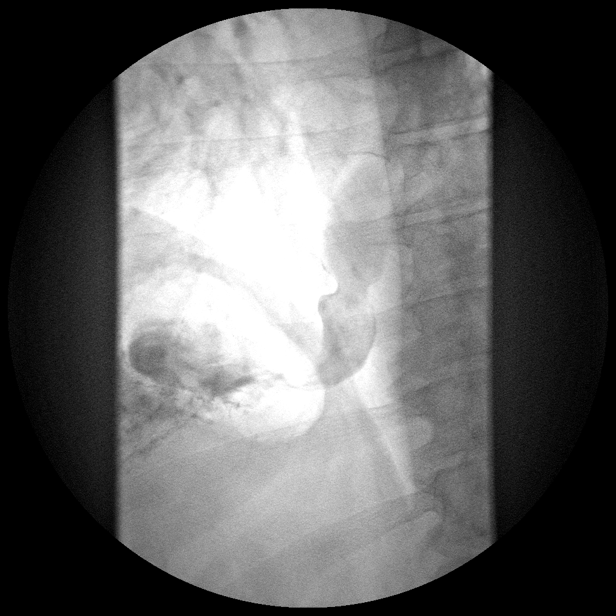

[14 of 14 positions shown; findings below may reference images not displayed]

FINDINGS: Normal esophageal peristalsis.

No fixed esophageal narrowing or stricture.

Small hiatal hernia.

Mild gastroesophageal reflux.

Normal gastric folds.
IMPRESSION: Small hiatal hernia with mild gastroesophageal reflux.

## 2017-01-16 IMAGING — US US ABDOMEN COMPLETE
1 series · 14 of 25 positions shown · non-contrast
Comparison: CT scan 02/12/2007

CLINICAL DATA: Bariatric surgery, obesity

EXAM:
ULTRASOUND ABDOMEN COMPLETE

[Series 1: us abdomen complete · 14 of 74 slices shown]
[im 1/74]
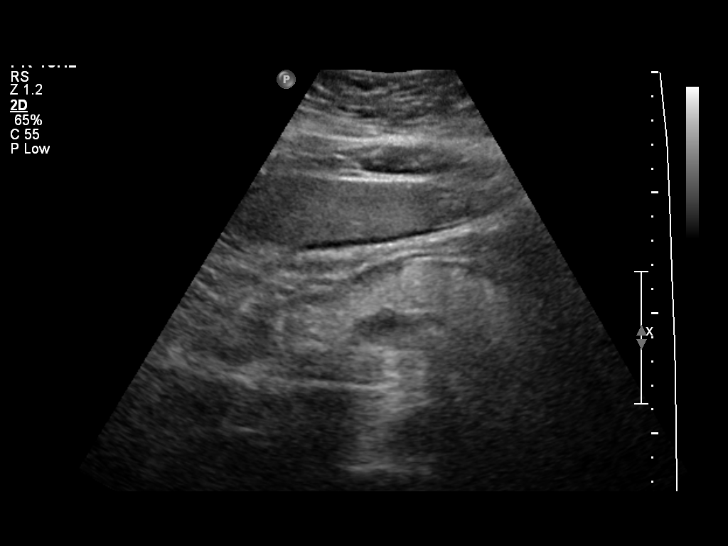
[im 7/74]
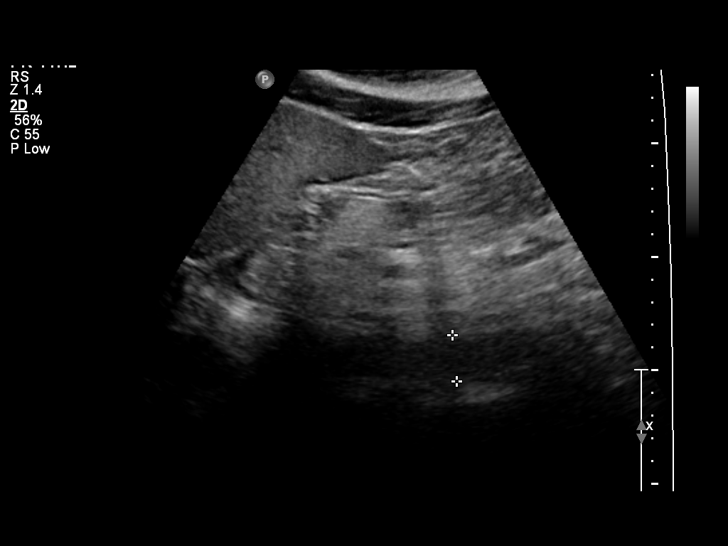
[im 13/74]
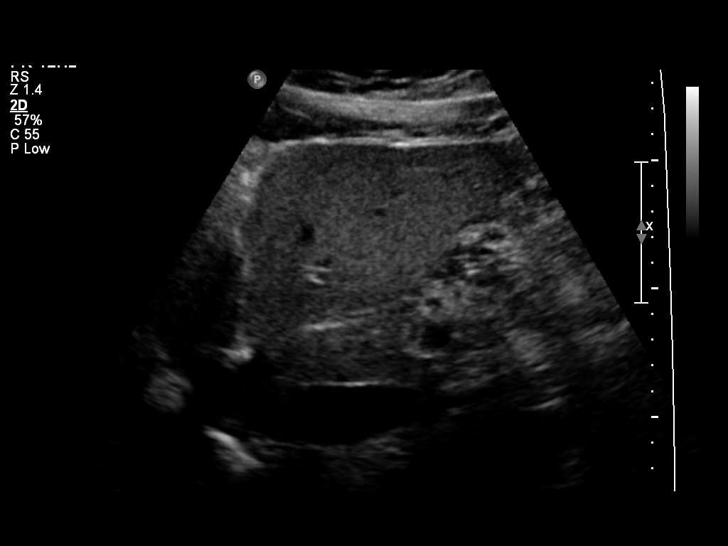
[im 19/74]
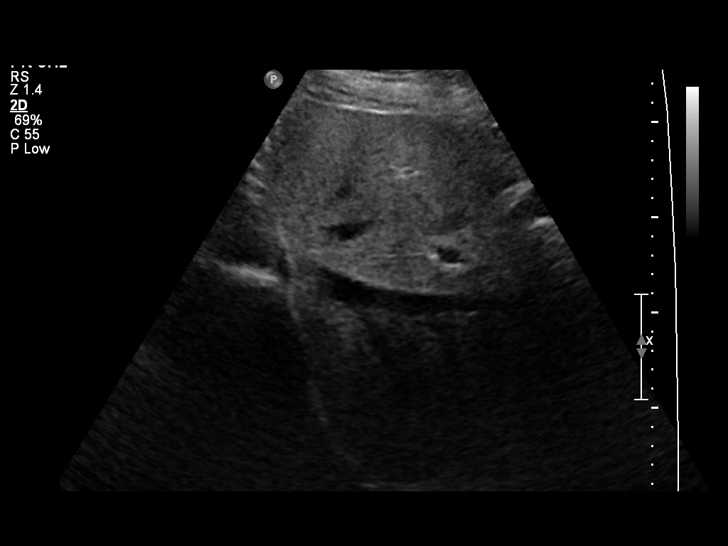
[im 25/74]
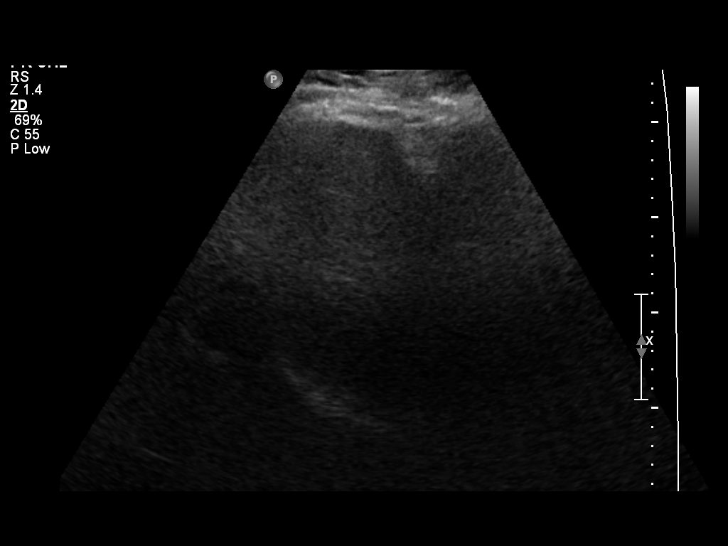
[im 28/74]
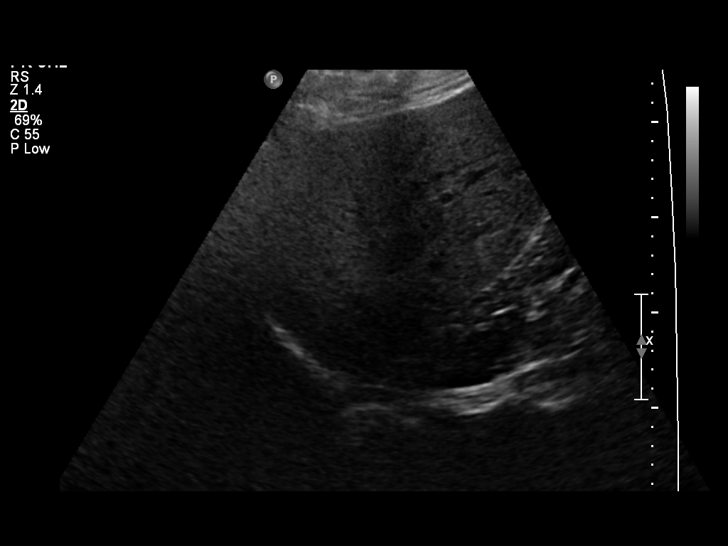
[im 34/74]
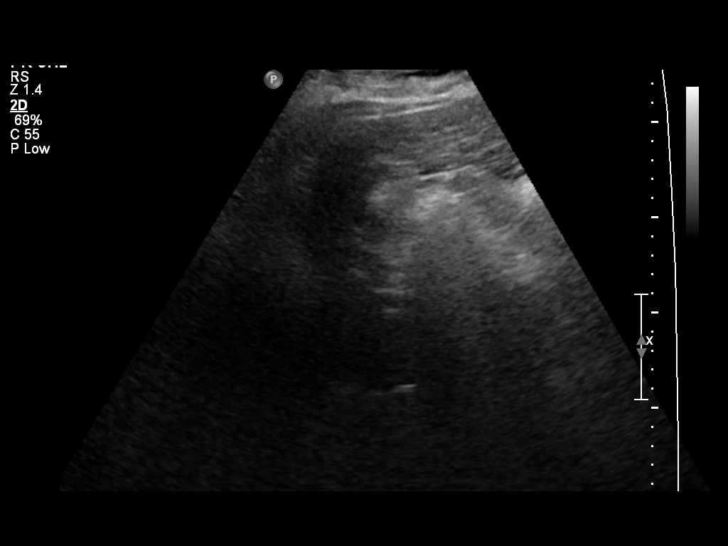
[im 40/74]
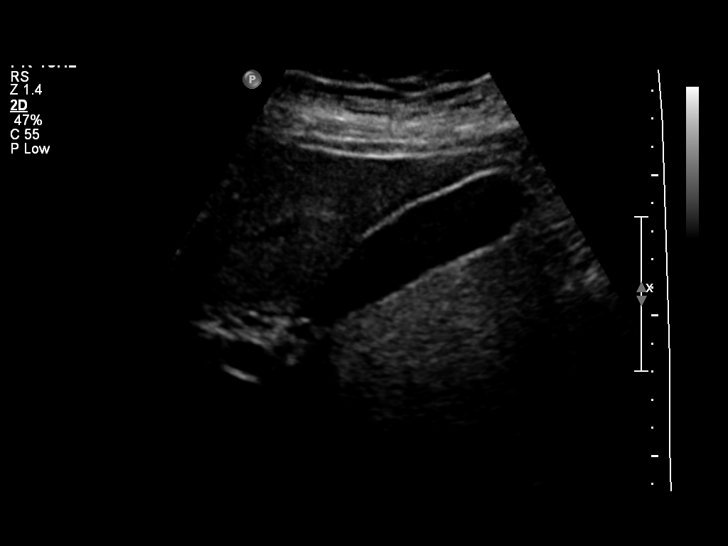
[im 46/74]
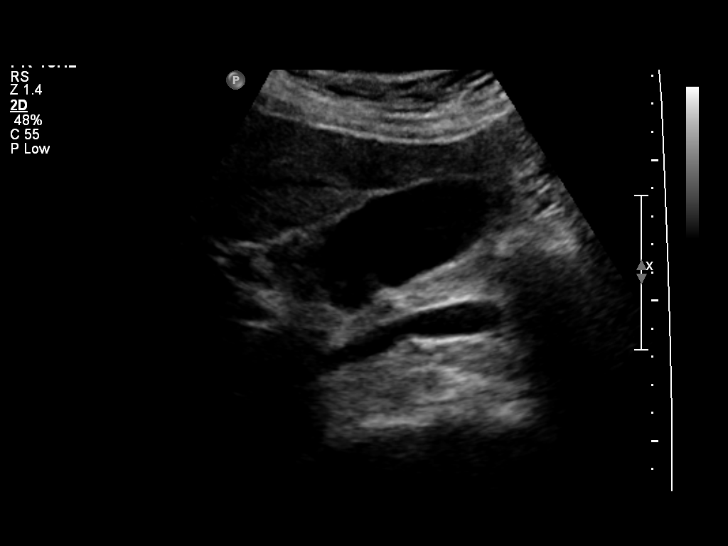
[im 49/74]
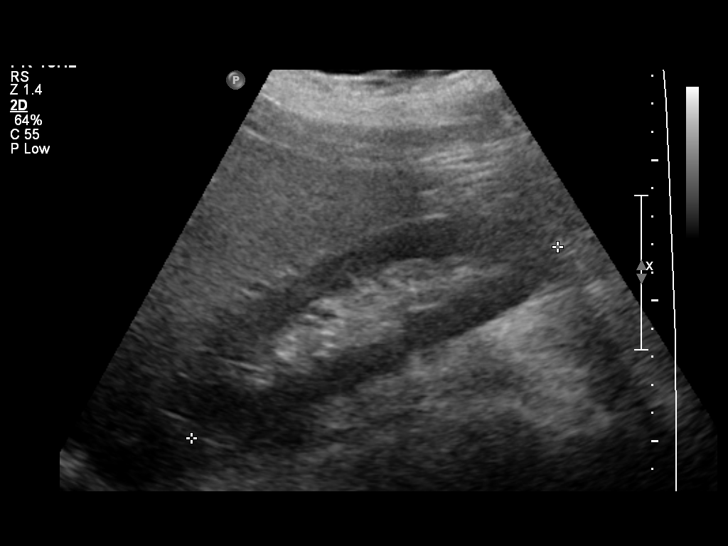
[im 55/74]
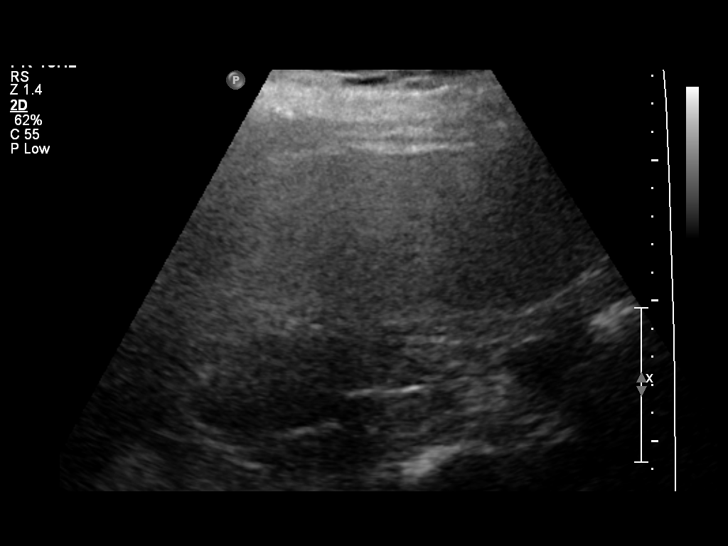
[im 61/74]
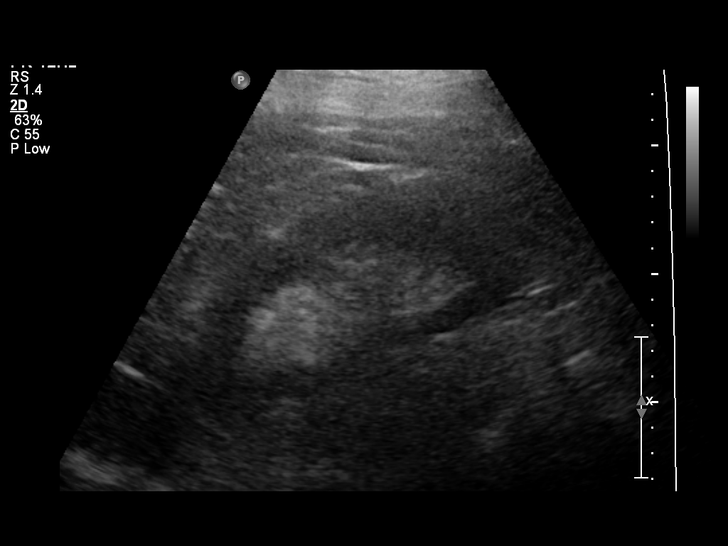
[im 67/74]
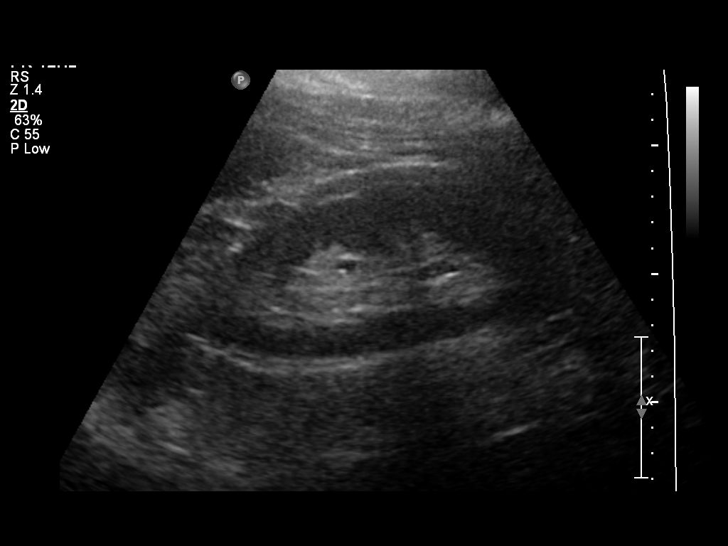
[im 74/74]
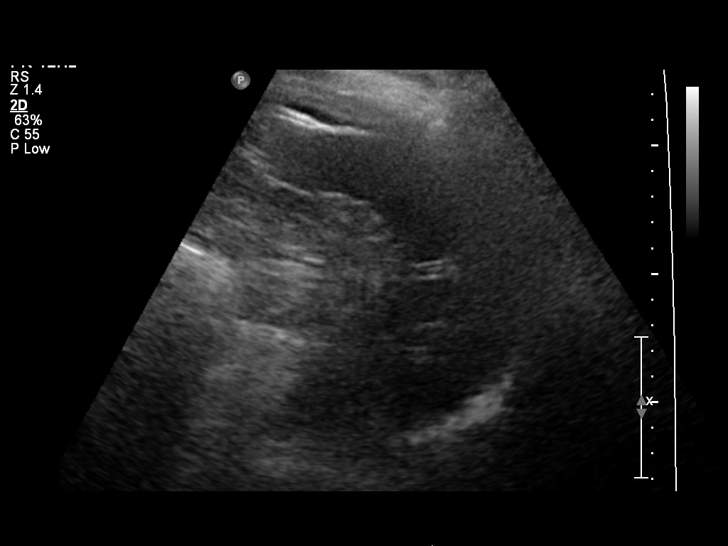

[14 of 25 positions shown; findings below may reference images not displayed]

FINDINGS: Gallbladder: No gallstones or wall thickening visualized. No
sonographic Murphy sign noted.

Common bile duct: Diameter: 5 mm in diameter within normal limits.

Liver: No focal lesion identified. Within normal limits in
parenchymal echogenicity.

IVC: No abnormality visualized.

Pancreas: Visualized portion unremarkable.

Spleen: Size and appearance within normal limits. Measures 8.4 cm in
length

Right Kidney: Length: 14.6 cm. Echogenicity within normal limits. No
mass or hydronephrosis visualized.

Left Kidney: Length: 15.3 cm. Echogenicity within normal limits. No
mass or hydronephrosis visualized.

Abdominal aorta: No aneurysm visualized. Measures up to 2.5 cm in
diameter.

Other findings: None.
IMPRESSION: Unremarkable abdominal ultrasound.

## 2017-09-25 ENCOUNTER — Encounter (HOSPITAL_COMMUNITY): Payer: Self-pay

## 2019-03-07 ENCOUNTER — Other Ambulatory Visit: Payer: Self-pay

## 2019-03-07 ENCOUNTER — Emergency Department (HOSPITAL_BASED_OUTPATIENT_CLINIC_OR_DEPARTMENT_OTHER): Payer: BLUE CROSS/BLUE SHIELD

## 2019-03-07 ENCOUNTER — Emergency Department (HOSPITAL_COMMUNITY)
Admission: EM | Admit: 2019-03-07 | Discharge: 2019-03-07 | Disposition: A | Discharge status: Home / Self Care | Payer: BLUE CROSS/BLUE SHIELD | Attending: Emergency Medicine | Admitting: Emergency Medicine

## 2019-03-07 ENCOUNTER — Encounter (HOSPITAL_COMMUNITY): Payer: Self-pay | Admitting: *Deleted

## 2019-03-07 DIAGNOSIS — Z87891 Personal history of nicotine dependence: Secondary | ICD-10-CM | POA: Insufficient documentation

## 2019-03-07 DIAGNOSIS — Z79899 Other long term (current) drug therapy: Secondary | ICD-10-CM | POA: Insufficient documentation

## 2019-03-07 DIAGNOSIS — Z7901 Long term (current) use of anticoagulants: Secondary | ICD-10-CM | POA: Insufficient documentation

## 2019-03-07 DIAGNOSIS — I10 Essential (primary) hypertension: Secondary | ICD-10-CM | POA: Diagnosis not present

## 2019-03-07 DIAGNOSIS — I824Z1 Acute embolism and thrombosis of unspecified deep veins of right distal lower extremity: Secondary | ICD-10-CM | POA: Diagnosis not present

## 2019-03-07 DIAGNOSIS — R2241 Localized swelling, mass and lump, right lower limb: Secondary | ICD-10-CM | POA: Diagnosis present

## 2019-03-07 DIAGNOSIS — M7989 Other specified soft tissue disorders: Secondary | ICD-10-CM

## 2019-03-07 LAB — CBC WITH DIFFERENTIAL/PLATELET
Abs Immature Granulocytes: 0.04 10*3/uL (ref 0.00–0.07)
Basophils Absolute: 0 10*3/uL (ref 0.0–0.1)
Basophils Relative: 0 %
Eosinophils Absolute: 0.5 10*3/uL (ref 0.0–0.5)
Eosinophils Relative: 5 %
HCT: 45.2 % (ref 39.0–52.0)
Hemoglobin: 14.9 g/dL (ref 13.0–17.0)
Immature Granulocytes: 0 %
Lymphocytes Relative: 26 %
Lymphs Abs: 3 10*3/uL (ref 0.7–4.0)
MCH: 29.3 pg (ref 26.0–34.0)
MCHC: 33 g/dL (ref 30.0–36.0)
MCV: 88.8 fL (ref 80.0–100.0)
Monocytes Absolute: 0.7 10*3/uL (ref 0.1–1.0)
Monocytes Relative: 6 %
Neutro Abs: 7.2 10*3/uL (ref 1.7–7.7)
Neutrophils Relative %: 63 %
Platelets: 284 10*3/uL (ref 150–400)
RBC: 5.09 MIL/uL (ref 4.22–5.81)
RDW: 12.8 % (ref 11.5–15.5)
WBC: 11.4 10*3/uL — ABNORMAL HIGH (ref 4.0–10.5)
nRBC: 0 % (ref 0.0–0.2)

## 2019-03-07 LAB — BASIC METABOLIC PANEL
Anion gap: 10 (ref 5–15)
BUN: 10 mg/dL (ref 6–20)
CO2: 24 mmol/L (ref 22–32)
Calcium: 10.3 mg/dL (ref 8.9–10.3)
Chloride: 108 mmol/L (ref 98–111)
Creatinine, Ser: 0.83 mg/dL (ref 0.61–1.24)
GFR calc Af Amer: >60 mL/min (ref >60)
GFR calc non Af Amer: >60 mL/min (ref >60)
Glucose, Bld: 113 mg/dL — ABNORMAL HIGH (ref 70–99)
Potassium: 4.1 mmol/L (ref 3.5–5.1)
Sodium: 142 mmol/L (ref 135–145)

## 2019-03-07 MED ORDER — RIVAROXABAN (XARELTO) VTE STARTER PACK (15 & 20 MG): ORAL_TABLET | ORAL | 0 refills | Status: DC

## 2019-03-07 MED ORDER — RIVAROXABAN 15 MG PO TABS
15.0000 mg | ORAL_TABLET | Freq: Two times a day (BID) | ORAL | Status: DC
  Filled 2019-03-07: qty 1

## 2019-03-07 MED ORDER — RIVAROXABAN 20 MG PO TABS: 20.0000 mg | ORAL_TABLET | Freq: Every day | ORAL | Status: DC

## 2019-03-07 MED ORDER — RIVAROXABAN (XARELTO) EDUCATION KIT FOR DVT/PE PATIENTS
PACK | Freq: Once | Status: AC
  Administered 2019-03-07: 19:00:00
  Filled 2019-03-07 (×2): qty 1

## 2019-03-07 MED ORDER — RIVAROXABAN 15 MG PO TABS
15.0000 mg | ORAL_TABLET | Freq: Once | ORAL | Status: AC
  Administered 2019-03-07: 19:00:00 15 mg via ORAL
  Filled 2019-03-07: qty 1

## 2019-03-07 NOTE — ED Provider Notes (Signed)
[facility] EMERGENCY DEPARTMENT Provider Note   CSN: [id number] Arrival date & time: 03/07/19  1705    History   Chief Complaint Chief Complaint  Patient presents with   Leg Swelling    HPI Joseph Lang is a 43 y.o. male.     HPI  Patient is a 43 year old male past medical history hypertension, obesity, sleep apnea, and pulmonary embolism no longer on anticoagulation presenting for right lower extremity swelling and erythema.  Patient reports that he initially noticed the erythema spreading in a strip of his medial calf 1 week ago.  He thought that it may be a sunburn.  Patient reports that he had some right lower extremity discomfort 3 weeks ago and had a ultrasound through another health system that shows superficial thrombophlebitis of the right medial calf.  He feels that it may have gotten worse.  He describes it as uncomfortable but denies any calf pain or popliteal pain.  Denies fevers or chills.  Denies any nausea, vomiting, chest pain or shortness of breath.  He does have some right-sided low back pain.  He denies similar sensations to when he had a prior pulmonary embolism.  Past Medical History:  Diagnosis Date   Hypertension    Obesity    Sleep apnea     Patient Active Problem List   Diagnosis Date Noted   Pulmonary emboli ([place]) 04/08/2015   Right leg pain 04/08/2015   S/P laparoscopic sleeve gastrectomy May 2016 02/14/2015   BMI 50.0-59.9, adult ([place]) 07/18/2014    Past Surgical History:  Procedure Laterality Date   HIP SURGERY     LAPAROSCOPIC GASTRIC SLEEVE RESECTION N/A 02/14/2015   Procedure: LAPAROSCOPIC GASTRIC SLEEVE RESECTION WITH UPPER ENDOSCOPY;  Surgeon: Johnathan Hausen, MD;  Location: [facility];  Service: General;  Laterality: N/A;   PELVIS CLOSED REDUCTION          Home Medications    Prior to Admission medications   Medication Sig Start Date End Date Taking? Authorizing Provider  clotrimazole (LOTRIMIN) 1 %  cream Apply 1 application topically 2 (two) times daily. 12/15/16   Law, Bea Graff, PA-C  meclizine (ANTIVERT) 25 MG tablet Take 1 tablet (25 mg total) by mouth 3 (three) times daily as needed for dizziness. Patient not taking: Reported on 12/29/2014 10/16/14   Elnora Morrison, MD  ondansetron (ZOFRAN) 4 MG tablet Take 1 tablet (4 mg total) by mouth every 6 (six) hours. 12/15/16   Law, Bea Graff, PA-C  oxyCODONE-acetaminophen (PERCOCET/ROXICET) 5-325 MG tablet Take 1-2 tablets by mouth every 6 (six) hours as needed for severe pain. 12/15/16   Law, Bea Graff, PA-C  Rivaroxaban (XARELTO STARTER PACK) 15 & 20 MG TBPK Take as directed on package: Start with one 28m tablet by mouth twice a day with food. On Day 22, switch to one 268mtablet once a day with food. 04/09/15   ZaKelvin CellarMD  rivaroxaban (XARELTO) 20 MG TABS tablet Take 1 tablet (20 mg total) by mouth daily with supper. 04/30/15   ZaKelvin CellarMD  tamsulosin (FLOMAX) 0.4 MG CAPS capsule Take 1 capsule (0.4 mg total) by mouth daily after supper. 12/15/16   LaFrederica KusterPA-C    Family History History reviewed. No pertinent family history.  Social History Social History   Tobacco Use   Smoking status: Former Smoker    Years: 28.00    Types: Cigarettes    Last attempt to quit: 01/17/2015    Years since quitting: 4.1  Smokeless tobacco: Never Used  Substance Use Topics   Alcohol use: Yes    Comment: occ   Drug use: No     Allergies   Amoxicillin   Review of Systems Review of Systems  Constitutional: Negative for chills and fever.  Respiratory: Negative for shortness of breath.   Cardiovascular: Positive for leg swelling. Negative for chest pain.  Skin: Positive for color change. Negative for wound.  Allergic/Immunologic: Negative for immunocompromised state.  Neurological: Negative for weakness and numbness.  All other systems reviewed and are negative.    Physical Exam Updated Vital Signs BP (!) 142/87  (BP Location: Right Arm)    Pulse 74    Temp 98.6 F (37 C) (Oral)    Resp 18    Ht _0  (1.88 m)    Wt (!) 188.2 kg    SpO2 96%    BMI 53.28 kg/m   Physical Exam Vitals signs and nursing note reviewed.  Constitutional:      General: He is not in acute distress.    Appearance: He is well-developed. He is not diaphoretic.     Comments: Sitting comfortably in bed.  HENT:     Head: Normocephalic and atraumatic.  Eyes:     General:        Right eye: No discharge.        Left eye: No discharge.     Conjunctiva/sclera: Conjunctivae normal.     Comments: EOMs normal to gross examination.  Neck:     Musculoskeletal: Normal range of motion.  Cardiovascular:     Rate and Rhythm: Normal rate and regular rhythm.     Comments: Intact, 2+ DP pulse bilaterally. Pulmonary:     Comments: Converses comfortably.  No audible wheeze or stridor. Abdominal:     General: There is no distension.  Musculoskeletal: Normal range of motion.  Skin:    General: Skin is warm and dry.     Findings: Erythema present.     Comments: See clinical photo for details.  Right lower extremity is erythematous in a streak of murmur right medial calf.  Minimal popliteal or calf tenderness.  Patient has 2+ nonpitting edema of the right lower extremity.  His left lower extremity has 1+ nonpitting edema.  Neurological:     Mental Status: He is alert.     Comments: Cranial nerves intact to gross observation. Patient moves extremities without difficulty.  Psychiatric:        Behavior: Behavior normal.        Thought Content: Thought content normal.        Judgment: Judgment normal.        ED Treatments / Results  Labs (all labs ordered are listed, but only abnormal results are displayed) Labs Reviewed  BASIC METABOLIC PANEL - Abnormal; Notable for the following components:      Result Value   Glucose, Bld 113 (*)    All other components within normal limits  CBC WITH DIFFERENTIAL/PLATELET - Abnormal; Notable  for the following components:   WBC 11.4 (*)    All other components within normal limits    EKG None  Radiology Vas [place] Lower Extremity Venous (dvt) (only [facility])  Result Date: 03/07/2019  Lower Venous Study Indications: Swelling.  Limitations: Body habitus, poor ultrasound/tissue interface and patient pain tolerance, patient positioning. Comparison Study: 02/10/19 at [facility] - SVT in medial R leg Performing Technologist: Oliver Hum RVT  Examination Guidelines: A complete evaluation includes B-mode imaging, spectral  Doppler, color Doppler, and power Doppler as needed of all accessible portions of each vessel. Bilateral testing is considered an integral part of a complete examination. Limited examinations for reoccurring indications may be performed as noted.  +---------+---------------+---------+-----------+----------+-------+  RIGHT     Compressibility Phasicity Spontaneity Properties Summary  +---------+---------------+---------+-----------+----------+-------+  CFV       Full            Yes       Yes                             +---------+---------------+---------+-----------+----------+-------+  SFJ       Full                                                      +---------+---------------+---------+-----------+----------+-------+  FV Prox   Full                                                      +---------+---------------+---------+-----------+----------+-------+  FV Mid    Full                                                      +---------+---------------+---------+-----------+----------+-------+  FV Distal Full                                                      +---------+---------------+---------+-----------+----------+-------+  PFV       Full                                                      +---------+---------------+---------+-----------+----------+-------+  POP       None            No        No                     Acute     +---------+---------------+---------+-----------+----------+-------+  PTV       Full                                                      +---------+---------------+---------+-----------+----------+-------+  PERO      Full                                                      +---------+---------------+---------+-----------+----------+-------+ There is evidence of superficial vein thrombosis  involving a varicose vein of the proximal, medial, right calf.  +----+---------------+---------+-----------+----------+-------+  LEFT Compressibility Phasicity Spontaneity Properties Summary  +----+---------------+---------+-----------+----------+-------+  CFV  Full            Yes       Yes                             +----+---------------+---------+-----------+----------+-------+     Summary: Right: Findings consistent with acute deep vein thrombosis involving the right popliteal vein. No cystic structure found in the popliteal fossa. There is evidence of superficial vein thrombosis involving a varicose vein of the proximal, medial, right calf. Left: No evidence of common femoral vein obstruction.  *See table(s) above for measurements and observations.    Preliminary     Procedures Procedures (including critical care time)  Medications Ordered in ED Medications  rivaroxaban (XARELTO) Education Kit for DVT/PE patients (has no administration in time range)     Initial Impression / Assessment and Plan / ED Course  I have reviewed the triage vital signs and the nursing notes.  Pertinent labs & imaging results that were available during my care of the patient were reviewed by me and considered in my medical decision making (see chart for details).        This is a well-appearing 43 year old male with a history of VTE, morbid obesity, sleep apnea presenting for right lower extremity edema and swelling in the setting of a previous an active superficial thrombophlebitis diagnosis.  Ultrasound repeated today which  reveals DVT of the right popliteal vein.  He also has superficial thrombophlebitis overlying the area of erythema.  Does not appear or feel cellulitic in the setting of this diagnosis.  Will continue to treat for superficial thrombophlebitis.  He does not have any signs or symptoms of pulmonary embolism today.  He is appropriate for outpatient management.  Patient is given return precautions for any chest pain, shortness of breath, or increasing erythema or swelling of the right lower extremity.  Pharmacy reviewed education on Xarelto with the patient.  Patient is in understanding and agrees with the plan of care.  Final Clinical Impressions(s) / ED Diagnoses   Final diagnoses:  Acute deep vein thrombosis (DVT) of distal vein of right lower extremity [facility])    ED Discharge Orders         Ordered    Rivaroxaban 15 & 20 MG TBPK     03/07/19 1911           Tamala Julian 03/07/19 1916    Maudie Flakes, MD 03/08/19 1058

## 2019-03-07 NOTE — Progress Notes (Signed)
ANTICOAGULATION CONSULT NOTE - Initial Consult  Pharmacy Consult for Xarelto Indication: VTE treatment  Allergies  Allergen Reactions  . Amoxicillin Nausea And Vomiting    Patient Measurements: Height: 6\' 2"  (188 cm) Weight: (!) 415 lb (188.2 kg) IBW/kg (Calculated) : 82.2  Vital Signs: Temp: 98.6 F (37 C) (05/23 1712) Temp Source: Oral (05/23 1712) BP: 142/87 (05/23 1833) Pulse Rate: 74 (05/23 1833)  Labs: No results for input(s): HGB, HCT, PLT, APTT, LABPROT, INR, HEPARINUNFRC, HEPRLOWMOCWT, CREATININE, CKTOTAL, CKMB, TROPONINI in the last 72 hours.  CrCl cannot be calculated (Patient's most recent lab result is older than the maximum 21 days allowed.).   Medical History: Past Medical History:  Diagnosis Date  . Hypertension   . Obesity   . Sleep apnea     Medications:  (Not in a hospital admission)   Assessment: 72 YOM who presented on 5/23 with new RLE DVT to start Xarelto for anticoagulation.  The patient has been on Xarelto previously for a prior DVT and also has history of PE. During education, the patient asked about drinking large amounts of EtOH while on this medication - this was advised against due to increased risks of falling, etc. The PA stated she also had a similar conversation with the patient.   Goal of Therapy:  Appropriate anticoagulation for indication and hepatic/renal function    Plan:  - Xarelto 15 mg bidwc x 21 days followed by 20 mg once daily with supper (starting on 03/29/19) - Education provided to patient - Intended discharge from ED this evening  Thank you for allowing pharmacy to be a part of this patient's care.  Georgina Pillion, PharmD, BCPS Clinical Pharmacist Clinical phone for 03/07/2019: [phone] 03/07/2019 6:48 PM   **Pharmacist phone directory can now be found on [email or web address] ([email or web address]).  Listed under [facility].

## 2019-03-07 NOTE — ED Triage Notes (Signed)
PT provided the patient education packet on xarelto . Pt has coupon for discount on cost of medication.

## 2019-03-07 NOTE — Discharge Instructions (Addendum)
Information on my medicine - XARELTO (rivaroxaban)  This medication education was reviewed with me or my healthcare representative as part of my discharge preparation.    WHY WAS XARELTO PRESCRIBED FOR YOU? Xarelto was prescribed to treat blood clots that may have been found in the veins of your legs (deep vein thrombosis) or in your lungs (pulmonary embolism) and to reduce the risk of them occurring again.  WHAT DO YOU NEED TO KNOW ABOUT XARELTO? The starting dose is one 15 mg tablet taken TWICE daily with food for the FIRST 21 DAYS then on 03/29/19  the dose is changed to one 20 mg tablet taken ONCE A DAY with your evening meal.  DO NOT stop taking Xarelto without talking to the health care provider who prescribed the medication.  Refill your prescription for 20 mg tablets before you run out.  After discharge, you should have regular check-up appointments with your healthcare provider that is prescribing your Xarelto.  In the future your dose may need to be changed if your kidney function changes by a significant amount.  WHAT DO YOU DO IF YOU MISS A DOSE? If you are taking Xarelto TWICE DAILY and you miss a dose, take it as soon as you remember. You may take two 15 mg tablets (total 30 mg) at the same time then resume your regularly scheduled 15 mg twice daily the next day.  If you are taking Xarelto ONCE DAILY and you miss a dose, take it as soon as you remember on the same day then continue your regularly scheduled once daily regimen the next day. Do not take two doses of Xarelto at the same time.   IMPORTANT SAFETY INFORMATION Xarelto is a blood thinner medicine that can cause bleeding. You should call your healthcare provider right away if you experience any of the following: Bleeding from an injury or your nose that does not stop. Unusual colored urine (red or dark brown) or unusual colored stools (red or black). Unusual bruising for unknown reasons. A serious fall or if you  hit your head (even if there is no bleeding).  Some medicines may interact with Xarelto and might increase your risk of bleeding while on Xarelto. To help avoid this, consult your healthcare provider or pharmacist prior to using any new prescription or non-prescription medications, including herbals, vitamins, non-steroidal anti-inflammatory drugs (NSAIDs) and supplements.  This website has more information on Xarelto: VisitDestination.com.br.    Please return to the emergency department he develop any acute chest pain, shortness of breath, worsening swelling, pain, or redness in the leg.  If you experience any trauma while on Xarelto, please come to the emergency department.  Individuals who are on this medication are at higher risk of brain bleeding or bleeding in joints.   This medication can interact with alcohol.  It can cause you to bruise or bleed more easily.  Please limit drinking as much as possible.

## 2019-03-07 NOTE — ED Triage Notes (Signed)
Pt reports Rt leg swelling and skin is red in color .Pt reported he had a blood clot in same leg 2 weeks ago.

## 2019-03-07 NOTE — Progress Notes (Signed)
Right lower extremity venous duplex has been completed. Preliminary results can be found in CV Proc through chart review.  Results were given to Aviva Kluver PA.  03/07/19 6:13 PM Olen Cordial RVT

## 2019-03-07 NOTE — ED Notes (Signed)
Patient verbalizes understanding of discharge instructions . Opportunity for questions and answers were provided . Armband removed by staff ,Pt discharged from ED. W/C  offered at D/C  and Declined W/C at D/C and was escorted to lobby by RN.  

## 2019-07-01 ENCOUNTER — Other Ambulatory Visit: Payer: Self-pay | Admitting: Internal Medicine

## 2019-07-01 ENCOUNTER — Ambulatory Visit
Admission: RE | Admit: 2019-07-01 | Discharge: 2019-07-01 | Disposition: A | Discharge status: Home / Self Care | Payer: BC Managed Care – PPO | Source: Ambulatory Visit | Attending: Internal Medicine | Admitting: Internal Medicine

## 2019-07-01 DIAGNOSIS — R109 Unspecified abdominal pain: Secondary | ICD-10-CM

## 2019-10-16 DIAGNOSIS — I499 Cardiac arrhythmia, unspecified: Secondary | ICD-10-CM

## 2019-10-16 DIAGNOSIS — I2510 Atherosclerotic heart disease of native coronary artery without angina pectoris: Secondary | ICD-10-CM

## 2019-10-16 HISTORY — DX: Atherosclerotic heart disease of native coronary artery without angina pectoris: I25.10

## 2019-10-16 HISTORY — DX: Cardiac arrhythmia, unspecified: I49.9

## 2019-10-16 HISTORY — PX: CARDIAC CATHETERIZATION: SHX172

## 2020-01-01 ENCOUNTER — Other Ambulatory Visit: Payer: Self-pay | Admitting: Family Medicine

## 2020-01-01 DIAGNOSIS — M25562 Pain in left knee: Secondary | ICD-10-CM

## 2020-01-17 ENCOUNTER — Emergency Department (HOSPITAL_COMMUNITY): Payer: BC Managed Care – PPO

## 2020-01-17 ENCOUNTER — Emergency Department (HOSPITAL_COMMUNITY)
Admission: EM | Admit: 2020-01-17 | Discharge: 2020-01-17 | Disposition: A | Discharge status: Home / Self Care | Payer: BC Managed Care – PPO | Attending: Emergency Medicine | Admitting: Emergency Medicine

## 2020-01-17 ENCOUNTER — Other Ambulatory Visit: Payer: Self-pay

## 2020-01-17 ENCOUNTER — Encounter (HOSPITAL_COMMUNITY): Payer: Self-pay | Admitting: Emergency Medicine

## 2020-01-17 ENCOUNTER — Other Ambulatory Visit: Payer: Self-pay | Admitting: Physician Assistant

## 2020-01-17 DIAGNOSIS — R7989 Other specified abnormal findings of blood chemistry: Secondary | ICD-10-CM

## 2020-01-17 DIAGNOSIS — I4891 Unspecified atrial fibrillation: Secondary | ICD-10-CM

## 2020-01-17 DIAGNOSIS — F17210 Nicotine dependence, cigarettes, uncomplicated: Secondary | ICD-10-CM | POA: Diagnosis not present

## 2020-01-17 DIAGNOSIS — Z7901 Long term (current) use of anticoagulants: Secondary | ICD-10-CM | POA: Insufficient documentation

## 2020-01-17 DIAGNOSIS — Z20822 Contact with and (suspected) exposure to covid-19: Secondary | ICD-10-CM | POA: Diagnosis not present

## 2020-01-17 DIAGNOSIS — Z720 Tobacco use: Secondary | ICD-10-CM

## 2020-01-17 DIAGNOSIS — Z9884 Bariatric surgery status: Secondary | ICD-10-CM | POA: Insufficient documentation

## 2020-01-17 DIAGNOSIS — I10 Essential (primary) hypertension: Secondary | ICD-10-CM | POA: Diagnosis not present

## 2020-01-17 DIAGNOSIS — Z86718 Personal history of other venous thrombosis and embolism: Secondary | ICD-10-CM | POA: Diagnosis not present

## 2020-01-17 DIAGNOSIS — Z79899 Other long term (current) drug therapy: Secondary | ICD-10-CM | POA: Insufficient documentation

## 2020-01-17 DIAGNOSIS — Z86711 Personal history of pulmonary embolism: Secondary | ICD-10-CM | POA: Insufficient documentation

## 2020-01-17 HISTORY — DX: Personal history of thrombophlebitis: Z86.72

## 2020-01-17 HISTORY — DX: Personal history of pulmonary embolism: Z86.711

## 2020-01-17 HISTORY — DX: Bariatric surgery status: Z98.84

## 2020-01-17 HISTORY — DX: Tobacco use: Z72.0

## 2020-01-17 LAB — RESPIRATORY PANEL BY RT PCR (FLU A&B, COVID)
Influenza A by PCR: NEGATIVE
Influenza B by PCR: NEGATIVE
SARS Coronavirus 2 by RT PCR: NEGATIVE

## 2020-01-17 LAB — TROPONIN I (HIGH SENSITIVITY)
Troponin I (High Sensitivity): 153 ng/L (ref <18)
Troponin I (High Sensitivity): 1007 ng/L (ref <18)

## 2020-01-17 LAB — COMPREHENSIVE METABOLIC PANEL
ALT: 23 U/L (ref 0–44)
AST: 22 U/L (ref 15–41)
Albumin: 3.6 g/dL (ref 3.5–5.0)
Alkaline Phosphatase: 71 U/L (ref 38–126)
Anion gap: 13 (ref 5–15)
BUN: 10 mg/dL (ref 6–20)
CO2: 22 mmol/L (ref 22–32)
Calcium: 9.7 mg/dL (ref 8.9–10.3)
Chloride: 107 mmol/L (ref 98–111)
Creatinine, Ser: 0.78 mg/dL (ref 0.61–1.24)
GFR calc Af Amer: >60 mL/min (ref >60)
GFR calc non Af Amer: >60 mL/min (ref >60)
Glucose, Bld: 110 mg/dL — ABNORMAL HIGH (ref 70–99)
Potassium: 4 mmol/L (ref 3.5–5.1)
Sodium: 142 mmol/L (ref 135–145)
Total Bilirubin: 0.9 mg/dL (ref 0.3–1.2)
Total Protein: 6.6 g/dL (ref 6.5–8.1)

## 2020-01-17 LAB — RAPID URINE DRUG SCREEN, HOSP PERFORMED
Amphetamines: NOT DETECTED
Barbiturates: NOT DETECTED
Benzodiazepines: NOT DETECTED
Cocaine: NOT DETECTED
Opiates: NOT DETECTED
Tetrahydrocannabinol: NOT DETECTED

## 2020-01-17 LAB — CBC WITH DIFFERENTIAL/PLATELET
Abs Immature Granulocytes: 0.05 10*3/uL (ref 0.00–0.07)
Basophils Absolute: 0 10*3/uL (ref 0.0–0.1)
Basophils Relative: 0 %
Eosinophils Absolute: 0.2 10*3/uL (ref 0.0–0.5)
Eosinophils Relative: 2 %
HCT: 50.6 % (ref 39.0–52.0)
Hemoglobin: 15.9 g/dL (ref 13.0–17.0)
Immature Granulocytes: 1 %
Lymphocytes Relative: 24 %
Lymphs Abs: 2.4 10*3/uL (ref 0.7–4.0)
MCH: 28.8 pg (ref 26.0–34.0)
MCHC: 31.4 g/dL (ref 30.0–36.0)
MCV: 91.5 fL (ref 80.0–100.0)
Monocytes Absolute: 0.6 10*3/uL (ref 0.1–1.0)
Monocytes Relative: 6 %
Neutro Abs: 6.7 10*3/uL (ref 1.7–7.7)
Neutrophils Relative %: 67 %
Platelets: 243 10*3/uL (ref 150–400)
RBC: 5.53 MIL/uL (ref 4.22–5.81)
RDW: 13.3 % (ref 11.5–15.5)
WBC: 9.9 10*3/uL (ref 4.0–10.5)
nRBC: 0 % (ref 0.0–0.2)

## 2020-01-17 LAB — URINALYSIS, ROUTINE W REFLEX MICROSCOPIC
Bilirubin Urine: NEGATIVE
Glucose, UA: NEGATIVE mg/dL
Hgb urine dipstick: NEGATIVE
Ketones, ur: NEGATIVE mg/dL
Leukocytes,Ua: NEGATIVE
Nitrite: NEGATIVE
Protein, ur: NEGATIVE mg/dL
Specific Gravity, Urine: 1.004 — ABNORMAL LOW (ref 1.005–1.030)
pH: 8 (ref 5.0–8.0)

## 2020-01-17 LAB — PROTIME-INR
INR: 1.2 (ref 0.8–1.2)
Prothrombin Time: 14.7 seconds (ref 11.4–15.2)

## 2020-01-17 LAB — TSH: TSH: 1.564 u[IU]/mL (ref 0.350–4.500)

## 2020-01-17 LAB — MAGNESIUM: Magnesium: 2.2 mg/dL (ref 1.7–2.4)

## 2020-01-17 MED ORDER — METOPROLOL TARTRATE 5 MG/5ML IV SOLN
5.0000 mg | Freq: Once | INTRAVENOUS | Status: AC
  Administered 2020-01-17: 5 mg via INTRAVENOUS
  Filled 2020-01-17: qty 5

## 2020-01-17 MED ORDER — DILTIAZEM HCL-DEXTROSE 125-5 MG/125ML-% IV SOLN (PREMIX)
5.0000 mg/h | INTRAVENOUS | Status: DC
  Administered 2020-01-17: 5 mg/h via INTRAVENOUS
  Filled 2020-01-17: qty 125

## 2020-01-17 MED ORDER — ETOMIDATE 2 MG/ML IV SOLN
15.0000 mg | Freq: Once | INTRAVENOUS | Status: AC
  Administered 2020-01-17: 11:00:00 15 mg via INTRAVENOUS

## 2020-01-17 MED ORDER — ETOMIDATE 2 MG/ML IV SOLN
INTRAVENOUS | Status: AC | PRN
  Administered 2020-01-17: 15 mg via INTRAVENOUS

## 2020-01-17 MED ORDER — LORAZEPAM 2 MG/ML IJ SOLN
0.5000 mg | Freq: Once | INTRAMUSCULAR | Status: AC
  Administered 2020-01-17: 08:00:00 0.5 mg via INTRAVENOUS
  Filled 2020-01-17: qty 1

## 2020-01-17 MED ORDER — DILTIAZEM HCL ER COATED BEADS 180 MG PO CP24: 180.0000 mg | ORAL_CAPSULE | Freq: Every day | ORAL | 1 refills | Status: DC

## 2020-01-17 MED ORDER — SODIUM CHLORIDE 0.9 % IV SOLN: Freq: Once | INTRAVENOUS | Status: AC

## 2020-01-17 MED ORDER — ETOMIDATE 2 MG/ML IV SOLN
15.0000 mg | Freq: Once | INTRAVENOUS | Status: DC
  Filled 2020-01-17: qty 10

## 2020-01-17 NOTE — Sedation Documentation (Signed)
Remains in Afib.

## 2020-01-17 NOTE — Consult Note (Addendum)
Cardiology Consultation:   Patient ID: Joseph Lang MRN: [id number]; DOB: 1976/06/13  Admit date: 01/17/2020 Date of Consult: 01/17/2020  Primary Care Provider: Georgann Housekeeper, MD Primary Cardiologist: New to [facility]   Patient Profile:   Joseph Lang is a 44 y.o. male with a hx of obstructive sleep apnea (on CPAP), s/p gastric sleeve, history of pulmonary embolism and DVT, hypertension, obesity, family history of CAD and ongoing tobacco smoking who is being seen today for the evaluation of atrial fibrillation with rapid ventricular rate at the request of Dr. Clarice Pole.  Patient has history of gastric sleeve many years ago.  After  few months,  he developed multiple pulmonary embolism and placed on anticoagulation for 6 to 12 months.  In 2020 patient had DVT and now on Xarelto for anticoagulation.  Strong family history of heart disease.  Father and mother both has multiple PCI and eventually CABG.  This is followed by Dr. Jacinto Halim.  Both parents has history of atrial fibrillation.    History of Present Illness:   Joseph Lang woke up this morning with chest discomfort and then started to having fluttering sensation.  He felt dizzy and lightheaded.  EMS called and found to be in A. fib with RVR.  He was given bolus of IV diltiazem without improvement.  He was brought to ER for further evaluation.  Heart rate remained elevated despite Cardizem gtt. and IV metoprolol 5 mg x 1.  Attempted cardioversion at 100 J, 150 J and 200 J without success.  Now cardiology is asked for further evaluation.  Patient reports some chest discomfort about 2 weeks ago while laying down after meals.  He has history of GERD but pain was different.  He did not felt fluttering sensation at that time.  Episode lasted about 1 hour with self resolution.  He smokes 1 pack of tobacco since age 39.  Social drinking.  High caffeine intake.  He uses CPAP for at least 10 years (Has not seen by any provider for management of CPAP since  then).  No regular exercise but reports active at work.   High-sensitivity troponin 153. Respiratory panel negative for influenza and Covid Electrolytes normal TSH normal Urine drug screen clear Chest x-ray without acute abnormality  Past Medical History:  Diagnosis Date   Hx of deep vein thrombophlebitis of lower extremity    Hx of gastric bypass    Hx of pulmonary embolus    Hypertension    Obesity    Sleep apnea    Tobacco abuse     Past Surgical History:  Procedure Laterality Date   HIP SURGERY     LAPAROSCOPIC GASTRIC SLEEVE RESECTION N/A 02/14/2015   Procedure: LAPAROSCOPIC GASTRIC SLEEVE RESECTION WITH UPPER ENDOSCOPY;  Surgeon: Luretha Murphy, MD;  Location: [facility];  Service: General;  Laterality: N/A;   PELVIS CLOSED REDUCTION       Inpatient Medications: Scheduled Meds:  etomidate  15 mg Intravenous Once   Continuous Infusions:  diltiazem (CARDIZEM) infusion Stopped (01/17/20 1224)   PRN Meds:   Allergies:    Allergies  Allergen Reactions   Amoxicillin Nausea And Vomiting    Social History:   Social History   Socioeconomic History   Marital status: Single    Spouse name: Not on file   Number of children: Not on file   Years of education: Not on file   Highest education level: Not on file  Occupational History   Not on file  Tobacco Use  Smoking status: Current Every Day Smoker    Years: 28.00    Types: Cigarettes   Smokeless tobacco: Never Used  Substance and Sexual Activity   Alcohol use: Yes    Comment: occ   Drug use: No   Sexual activity: Not on file  Other Topics Concern   Not on file  Social History Narrative   ** Merged History Encounter **       Social Determinants of Health   Financial Resource Strain:    Difficulty of Paying Living Expenses:   Food Insecurity:    Worried About Charity fundraiser in the Last Year:    Arboriculturist in the Last Year:   Transportation Needs:    Film/video editor (Medical):    Lack  of Transportation (Non-Medical):   Physical Activity:    Days of Exercise per Week:    Minutes of Exercise per Session:   Stress:    Feeling of Stress :   Social Connections:    Frequency of Communication with Friends and Family:    Frequency of Social Gatherings with Friends and Family:    Attends Religious Services:    Active Member of Clubs or Organizations:    Attends Music therapist:    Marital Status:   Intimate Partner Violence:    Fear of Current or Ex-Partner:    Emotionally Abused:    Physically Abused:    Sexually Abused:     Family History:   Family History  Problem Relation Age of Onset   Atrial fibrillation Mother    CAD Mother    CAD Father    Atrial fibrillation Father      ROS:  Please see the history of present illness.  All other ROS reviewed and negative.     Physical Exam/Data:   Vitals:   01/17/20 1148 01/17/20 1149 01/17/20 1150 01/17/20 1223  BP: (!) 135/93  133/84 110/60  Pulse: 81 (!) 54 82 89  Resp: (!) 9 14 12  (!) 25  Temp:    98.4 F (36.9 C)  TempSrc:    Oral  SpO2: 95% 95% 95% 100%    Intake/Output Summary (Last 24 hours) at 01/17/2020 1228 Last data filed at 01/17/2020 0918 Gross per 24 hour  Intake --  Output 1100 ml  Net -1100 ml   Last 3 Weights 03/07/2019 12/15/2016 04/09/2015  Weight (lbs) 415 lb 392 lb 392 lb 13.8 oz  Weight (kg) 188.243 kg 177.81 kg 178.2 kg     There is no height or weight on file to calculate BMI.  General: Obese male in no acute distress HEENT: normal Lymph: no adenopathy Neck: no JVD Endocrine:  No thryomegaly Vascular: No carotid bruits; FA pulses 2+ bilaterally without bruits  Cardiac:  normal S1, S2; RRR; no murmur  Lungs:  clear to auscultation bilaterally, no wheezing, rhonchi or rales  Abd: soft, nontender, no hepatomegaly  Ext: no edema Musculoskeletal:  No deformities, BUE and BLE strength normal and equal Skin: warm and dry  Neuro:  CNs 2-12 intact, no focal abnormalities  noted Psych:  Normal affect   EKG:  The EKG was personally reviewed and demonstrates: Atrial fibrillation at rate of 146 bpm Telemetry:  Telemetry was personally reviewed and demonstrates: Atrial fibrillation at rate of 120 beats per minutes  Relevant CV Studies: As above  Laboratory Data:  High Sensitivity Troponin:   Recent Labs  Lab 01/17/20 0756  TROPONINIHS 153*     Chemistry  Recent Labs  Lab 01/17/20 0756  NA 142  K 4.0  CL 107  CO2 22  GLUCOSE 110*  BUN 10  CREATININE 0.78  CALCIUM 9.7  GFRNONAA >60  GFRAA >60  ANIONGAP 13    Recent Labs  Lab 01/17/20 0756  PROT 6.6  ALBUMIN 3.6  AST 22  ALT 23  ALKPHOS 71  BILITOT 0.9   Hematology Recent Labs  Lab 01/17/20 0756  WBC 9.9  RBC 5.53  HGB 15.9  HCT 50.6  MCV 91.5  MCH 28.8  MCHC 31.4  RDW 13.3  PLT 243   BNPNo results for input(s): BNP, PROBNP in the last 168 hours.  DDimer No results for input(s): DDIMER in the last 168 hours.   Radiology/Studies:  DG Chest Port 1 View  Result Date: 01/17/2020 CLINICAL DATA:  Irregular heart rate.  Denies chest pain. EXAM: PORTABLE CHEST 1 VIEW COMPARISON:  04/08/2015 FINDINGS: Cardiac silhouette is normal in size. No mediastinal or hilar masses. No evidence of adenopathy. Lungs are clear.  No pleural effusion or pneumothorax. Skeletal structures are grossly intact. IMPRESSION: No active disease. Electronically Signed   By: Amie Portland M.D.   On: 01/17/2020 08:07   {  Assessment and Plan:   New onset atrial fibrillation with rapid ventricular rate -He was given IV Cardizem bolus then on drip.  Given IV metoprolol 5 mg x 1.  Unsuccessful cardioversion x3.  Heart rate remained in 120s.  Anticoagulated with Xarelto for history of pulmonary embolism and DVT.  Reports compliance.  -Plan for another cardioversion with pressure -TSH normal -Compliant with CPAP -Advised complete cessation of caffeinated product -Joseph Lang need echo  2.  Elevated troponin -Chest  pain 2 weeks ago symptoms due to GERD -Current elevated troponin likely due to demand ischemia from elevated heart rate -He does have a strong family history of CAD and risk factor -Joseph Lang follow as outpatient  3.  Tobacco smoking -Recommended cessation  4.  OSA on CPAP -He needs to establish care with provider -His CPAP setting has not checked for greater than 10 years  5.  Morbid obesity with history of gastric sleeve -Encourage weight loss  6.  History of pulmonary embolism and DVT -On Xarelto for anticoagulation For questions or updates, please contact [facility] Please consult [email or web address] for contact info under     Joseph Pont, PA  01/17/2020 12:28 PM  I have seen and examined this patient with Joseph Lang.  Agree with above, note added to reflect my findings.  On exam, irregular, tachycardic, lungs clear.  Patient presented to the hospital in atrial fibrillation.  This is his first episode of atrial fibrillation.  He is currently on Xarelto for a DVT and PE.  He has not missed any doses of his Xarelto.  He initially had an attempt at cardioversion at 100, 150, and 200 J in the emergency room.  He did not convert to sinus rhythm.  He says that he felt palpitations and shortness of breath this morning with a little bit of chest discomfort.  In discussion with the emergency room physician, 1 further attempt at cardioversion was performed.  Pressure was held on the defibrillator pads and he converted to sinus rhythm with heart rates in the 60s.  Would plan for discharge today on continued Xarelto and 180 mg of diltiazem.  He Joseph Lang have follow-up in A. fib clinic as well as be arranged for an outpatient echo.  Joseph Lang M. Ark Agrusa MD 01/17/2020 12:37 PM

## 2020-01-17 NOTE — Sedation Documentation (Signed)
Cardioverted @ 100J --

## 2020-01-17 NOTE — Progress Notes (Signed)
RT at bedside for cardioversion.  Pt was on 2L [place] with ETCO2 monitoring.  PT's O2 increased to 4L. Jaw thrust technique used to support airway after 1st shock.  Pt stable during procedure.  RT will continue to monitor.

## 2020-01-17 NOTE — ED Notes (Signed)
Patient verbalizes understanding of discharge instructions. Opportunity for questioning and answers were provided. Armband removed by staff, pt discharged from ED to home 

## 2020-01-17 NOTE — ED Notes (Signed)
Admits to drinking 1 rockstar every morning, no other caffeine throughout day.

## 2020-01-17 NOTE — Sedation Documentation (Signed)
Charge at 200 J delivered. NSR

## 2020-01-17 NOTE — ED Notes (Signed)
Cardizem gtt paused during cardioversion- restarted at 1130

## 2020-01-17 NOTE — ED Provider Notes (Addendum)
[facility] EMERGENCY DEPARTMENT Provider Note   CSN: [id number] Arrival date & time: 01/17/20  8381     History Chief Complaint  Patient presents with  . Atrial Fibrillation    [facility] is a 44 y.o. male.  HPI Patient awakened to go to the bathroom at 5 AM.  He suddenly felt his heart start to race and palpitate.  He reports at onset he had a little bit of chest pain but that has resolved.  He can still feel his heart racing and skipping.  No associated shortness of breath.  No syncope.  No prior history of atrial fibrillation.  Patient does have history of DVT and PE.  He has been anticoagulated continuously on Xarelto for about 6 months.  He reports he is otherwise been well.  No fevers no chills.  No cough or shortness of breath.  No recent vomiting or diarrhea.  No pain or swelling in the lower extremities.  Family history positive for atrial fibrillation in both parents. Patient transported by EMS.  EMS administered 20 mg IV of Cardizem and 10 mg IV followed by drip.  Limited response to heart rate.  Patient presents with heart rate still 130s to 160s.    Past Medical History:  Diagnosis Date  . Hx of deep vein thrombophlebitis of lower extremity   . Hx of gastric bypass   . Hx of pulmonary embolus   . Hypertension   . Obesity   . Sleep apnea   . Tobacco abuse     Patient Active Problem List   Diagnosis Date Noted  . Pulmonary emboli (HCC) 04/08/2015  . Right leg pain 04/08/2015  . S/P laparoscopic sleeve gastrectomy May 2016 02/14/2015  . BMI 50.0-59.9, adult (HCC) 07/18/2014    Past Surgical History:  Procedure Laterality Date  . HIP SURGERY    . LAPAROSCOPIC GASTRIC SLEEVE RESECTION N/A 02/14/2015   Procedure: LAPAROSCOPIC GASTRIC SLEEVE RESECTION WITH UPPER ENDOSCOPY;  Surgeon: Luretha Murphy, MD;  Location: [facility] ORS;  Service: General;  Laterality: N/A;  . PELVIS CLOSED REDUCTION         Family History  Problem Relation Age of Onset  .  Atrial fibrillation Mother   . CAD Mother   . CAD Father   . Atrial fibrillation Father     Social History   Tobacco Use  . Smoking status: Current Every Day Smoker    Years: 28.00    Types: Cigarettes  . Smokeless tobacco: Never Used  Substance Use Topics  . Alcohol use: Yes    Comment: occ  . Drug use: No    Home Medications Prior to Admission medications   Medication Sig Start Date End Date Taking? Authorizing Provider  benazepril (LOTENSIN) 20 MG tablet Take 20 mg by mouth daily. 12/09/19  Yes [provider]  RABEprazole (ACIPHEX) 20 MG tablet Take 20 mg by mouth 2 (two) times daily. 01/04/20  Yes [provider]  XARELTO 20 MG TABS tablet Take 20 mg by mouth daily. 01/06/20  Yes [provider]  clotrimazole (LOTRIMIN) 1 % cream Apply 1 application topically 2 (two) times daily. Patient not taking: Reported on 01/17/2020 12/15/16   Emi Holes, PA-C  diltiazem (CARDIZEM CD) 180 MG 24 hr capsule Take 1 capsule (180 mg total) by mouth daily. 01/17/20   Arby Barrette, MD  meclizine (ANTIVERT) 25 MG tablet Take 1 tablet (25 mg total) by mouth 3 (three) times daily as needed for dizziness. Patient  not taking: Reported on 12/29/2014 10/16/14   Blane Ohara, MD  ondansetron (ZOFRAN) 4 MG tablet Take 1 tablet (4 mg total) by mouth every 6 (six) hours. Patient not taking: Reported on 01/17/2020 12/15/16   Emi Holes, PA-C  oxyCODONE-acetaminophen (PERCOCET/ROXICET) 5-325 MG tablet Take 1-2 tablets by mouth every 6 (six) hours as needed for severe pain. Patient not taking: Reported on 01/17/2020 12/15/16   Emi Holes, PA-C  tamsulosin (FLOMAX) 0.4 MG CAPS capsule Take 1 capsule (0.4 mg total) by mouth daily after supper. Patient not taking: Reported on 01/17/2020 12/15/16   Emi Holes, PA-C    Allergies    Amoxicillin  Review of Systems   Review of Systems 10 Systems reviewed and are negative for acute change except as noted in the HPI.  Physical  Exam Updated Vital Signs BP (!) 149/91 (BP Location: Right Wrist)   Pulse 69   Temp 97.7 F (36.5 C) (Oral)   Resp 17   SpO2 99%   Physical Exam Constitutional:      Comments: Alert nontoxic.  Clinically well in appearance.  No respiratory distress.  Obesity.  HENT:     Head: Normocephalic and atraumatic.     Mouth/Throat:     Mouth: Mucous membranes are moist.     Pharynx: Oropharynx is clear.  Eyes:     Extraocular Movements: Extraocular movements intact.  Cardiovascular:     Comments: Tachycardia, irregularly irregular. Pulmonary:     Effort: Pulmonary effort is normal.     Breath sounds: Normal breath sounds.  Abdominal:     General: There is no distension.     Palpations: Abdomen is soft.     Tenderness: There is no abdominal tenderness. There is no guarding.  Musculoskeletal:        General: No swelling or tenderness. Normal range of motion.     Right lower leg: No edema.     Left lower leg: No edema.  Neurological:     General: No focal deficit present.     Mental Status: He is oriented to person, place, and time.     Cranial Nerves: No cranial nerve deficit.     Coordination: Coordination normal.  Psychiatric:        Mood and Affect: Mood normal.     ED Results / Procedures / Treatments   Labs (all labs ordered are listed, but only abnormal results are displayed) Labs Reviewed  COMPREHENSIVE METABOLIC PANEL - Abnormal; Notable for the following components:      Result Value   Glucose, Bld 110 (*)    All other components within normal limits  URINALYSIS, ROUTINE W REFLEX MICROSCOPIC - Abnormal; Notable for the following components:   Color, Urine COLORLESS (*)    Specific Gravity, Urine 1.004 (*)    All other components within normal limits  TROPONIN I (HIGH SENSITIVITY) - Abnormal; Notable for the following components:   Troponin I (High Sensitivity) 153 (*)    All other components within normal limits  TROPONIN I (HIGH SENSITIVITY) - Abnormal; Notable  for the following components:   Troponin I (High Sensitivity) 1,007 (*)    All other components within normal limits  RESPIRATORY PANEL BY RT PCR (FLU A&B, COVID)  CBC WITH DIFFERENTIAL/PLATELET  PROTIME-INR  RAPID URINE DRUG SCREEN, HOSP PERFORMED  MAGNESIUM  TSH    EKG EKG Interpretation  Date/Time:  Sunday January 17 2020 12:28:18 EDT Ventricular Rate:  68 PR Interval:    QRS Duration: 90  QT Interval:  329 QTC Calculation: 350 R Axis:   57 Text Interpretation: Sinus rhythm Low voltage, precordial leads Probable anteroseptal infarct, old afib resolved, no STEMI Confirmed by Arby BarrettePfeiffer, Diron Haddon [phone]([id number]) on 01/17/2020 2:12:11 PM   Radiology DG Chest Port 1 View  Result Date: 01/17/2020 CLINICAL DATA:  Irregular heart rate.  Denies chest pain. EXAM: PORTABLE CHEST 1 VIEW COMPARISON:  04/08/2015 FINDINGS: Cardiac silhouette is normal in size. No mediastinal or hilar masses. No evidence of adenopathy. Lungs are clear.  No pleural effusion or pneumothorax. Skeletal structures are grossly intact. IMPRESSION: No active disease. Electronically Signed   By: Amie Portlandavid  Ormond M.D.   On: 01/17/2020 08:07    Procedures .Cardioversion  Date/Time: 01/17/2020 11:32 AM Performed by: Arby BarrettePfeiffer, Jenkins Risdon, MD Authorized by: Arby BarrettePfeiffer, Wendell Nicoson, MD   Consent:    Consent obtained:  Verbal   Consent given by:  Patient   Risks discussed:  Cutaneous burn, death, induced arrhythmia and pain   Alternatives discussed:  Rate-control medication Pre-procedure details:    Cardioversion basis:  Elective   Rhythm:  Atrial fibrillation   Electrode placement:  Anterior-posterior Patient sedated: Yes. Refer to sedation procedure documentation for details of sedation.  Attempt one:    Cardioversion mode:  Synchronous   Waveform:  Biphasic   Shock (Joules):  100   Shock outcome:  No change in rhythm Attempt two:    Cardioversion mode:  Synchronous   Waveform:  Biphasic   Shock (Joules):  150   Shock outcome:  No change  in rhythm Attempt three:    Cardioversion mode:  Synchronous   Waveform:  Biphasic   Shock (Joules):  200   Shock outcome:  No change in rhythm Post-procedure details:    Patient status:  Alert   Patient tolerance of procedure:  Tolerated well, no immediate complications Comments:     Patient was sedated with etomidate.  Good sedation.  3 synchronized attempts at cardioversion with no change in rhythm.  Patient awake post procedure.  Alert and at baseline.   .Sedation  Date/Time: 01/17/2020 11:34 AM Performed by: Arby BarrettePfeiffer, Fatimah Sundquist, MD Authorized by: Arby BarrettePfeiffer, Pheng Prokop, MD   Consent:    Consent obtained:  Verbal   Consent given by:  Patient   Risks discussed:  Allergic reaction, prolonged hypoxia resulting in organ damage, prolonged sedation necessitating reversal, respiratory compromise necessitating ventilatory assistance and intubation, inadequate sedation, dysrhythmia, nausea and vomiting Universal protocol:    Immediately prior to procedure a time out was called: yes     Patient identity confirmation method:  Verbally with patient and arm band Indications:    Procedure performed:  Cardioversion Pre-sedation assessment:    Time since last food or drink:  8pm   ASA classification: class 3 - patient with severe systemic disease     Neck mobility: normal     Mouth opening:  2 finger widths   Thyromental distance:  3 finger widths   Mallampati score:  II - soft palate, uvula, fauces visible   Pre-sedation assessments completed and reviewed: airway patency, cardiovascular function, hydration status, mental status, nausea/vomiting, pain level, respiratory function and temperature     Pre-sedation assessment completed:  01/17/2020 10:30 AM Immediate pre-procedure details:    Reassessment: Patient reassessed immediately prior to procedure     Reviewed: vital signs, relevant labs/tests and NPO status     Verified: bag valve mask available, emergency equipment available, intubation equipment  available, IV patency confirmed, oxygen available and reversal medications available   Procedure details (  see MAR for exact dosages):    Preoxygenation:  Nasal cannula   Sedation:  Etomidate   Intended level of sedation: deep   Intra-procedure monitoring:  Blood pressure monitoring, cardiac monitor, continuous pulse oximetry, continuous capnometry, frequent LOC assessments and frequent vital sign checks   Intra-procedure events: none     Total Provider sedation time (minutes):  20 Post-procedure details:    Post-sedation assessment completed:  01/17/2020 11:36 AM   Attendance: Constant attendance by certified staff until patient recovered     Recovery: Patient returned to pre-procedure baseline     Post-sedation assessments completed and reviewed: airway patency, cardiovascular function, hydration status, mental status, nausea/vomiting, pain level and respiratory function     Patient is stable for discharge or admission: yes     Patient tolerance:  Tolerated well, no immediate complications   (including critical care time)  Repeat sedation procedure: Patient sedated by myself with etomidate the second episode for Dr. Curt Bears performing cardioversion.  Patient reassessed again prior to procedure.  He is alert and appropriate.  Blood pressures are stable.  Heart rate remains atrial fibrillation. Patient is monitored, respiratory and nursing at bedside.  All necessary equipment present. Etomidate 15 mg IV push administered. I have remained at patient's bedside for monitoring throughout the procedure. Patient had good sedation. No events during sedation.  Patient tolerated well. Awakens to normal mental status after cardioversion. Successful cardioversion by Dr. Curt Bears separately noted. CRITICAL CARE Performed by: Charlesetta Shanks   Total critical care time: 61 minutes  Critical care time was exclusive of separately billable procedures and treating other patients.  Critical care was  necessary to treat or prevent imminent or life-threatening deterioration.  Critical care was time spent personally by me on the following activities: development of treatment plan with patient and/or surrogate as well as nursing, discussions with consultants, evaluation of patient's response to treatment, examination of patient, obtaining history from patient or surrogate, ordering and performing treatments and interventions, ordering and review of laboratory studies, ordering and review of radiographic studies, pulse oximetry and re-evaluation of patient's condition. Medications Ordered in ED Medications  diltiazem (CARDIZEM) 125 mg in dextrose 5% 125 mL (1 mg/mL) infusion (0 mg/hr Intravenous Stopped 01/17/20 1238)  etomidate (AMIDATE) injection 15 mg (15 mg Intravenous Not Given 01/17/20 1238)  metoprolol tartrate (LOPRESSOR) injection 5 mg (5 mg Intravenous Given 01/17/20 0807)  0.9 %  sodium chloride infusion ( Intravenous Stopped 01/17/20 1322)  LORazepam (ATIVAN) injection 0.5 mg (0.5 mg Intravenous Given 01/17/20 0826)  etomidate (AMIDATE) injection 15 mg (15 mg Intravenous Given 01/17/20 1121)  etomidate (AMIDATE) injection (15 mg Intravenous Given 01/17/20 1227)   ED Course  I have reviewed the triage vital signs and the nursing notes.  Pertinent labs & imaging results that were available during my care of the patient were reviewed by me and considered in my medical decision making (see chart for details).  Clinical Course as of Jan 16 1434  Sun Jan 17, 2020  0942 Consult: Reviewed with cardiology Dr. Debara Pickett.  Agrees with proceeding with cardioversion.   [MP]  5009 Consult: Dr. Debara Pickett for admission.   [MP]  3818 Consult: Reviewed troponin elevation with Dr. Curt Bears.  At this time consistent with demand ischemia from A. fib and cardioversion.  Patient okay for discharge and follow-up.   [MP]    Clinical Course User Index [MP] Charlesetta Shanks, MD   MDM Rules/Calculators/A&P  Patient with acute onset of atrial fibrillation.  He is anticoagulated with Xarelto and compliant with medications.  Patient had chest pain at onset of symptoms but none since.  He has been free of chest pain.  He has not had dyspnea.  Blood pressures have been stable.  Patient has been on continuous Cardizem drip.  He was given a Cardizem bolus by EMS of 20 mg followed by 10 mg.  He was given metoprolol 5 mg IV in the emergency department.  Cardioversion attempted.  3 synchronized shocks delivered at 100, 150 and 200 J with no change in rhythm.  Will reconsult cardiology for admission for continued rate control and alternate cardioversion either medical or repeat attempt with cardiology.  Patient is stable post procedure.  He is alert with clear mental status.  No respiratory distress.  Blood pressure stable.  Will resume Cardizem drip.  Patient has been successfully cardioverted with EP at bedside.  We will continue to observe and anticipate discharge on Cardizem with follow-up with cardiology for further diagnostic evaluation.  Patient is already on Xarelto. Final Clinical Impression(s) / ED Diagnoses Final diagnoses:  Atrial fibrillation with RVR (HCC)    Rx / DC Orders ED Discharge Orders         Ordered    diltiazem (CARDIZEM CD) 180 MG 24 hr capsule  Daily     01/17/20 1433           Arby Barrette, MD 01/17/20 1140    Arby Barrette, MD 01/17/20 1144    Arby Barrette, MD 01/17/20 1246    Arby Barrette, MD 01/17/20 1435

## 2020-01-17 NOTE — Discharge Instructions (Addendum)
1.  Start taking Cardizem daily as prescribed.  Continue all of your other regular medications. 2.  Follow-up with cardiology.  Call for a appointment.  Dr. Elberta Fortis contact information is in your discharge instructions. 3.  Return to emergency department if you get chest pain, shortness of breath, feeling lightheaded or dizzy or other concerning symptoms.

## 2020-01-17 NOTE — ED Notes (Signed)
States that he has been seeing the telemetry antennas in the corner of the room moving and "dropping down from ceiling."  Does have a hx of sleep apnea -, uses CPAP at home

## 2020-01-17 NOTE — ED Triage Notes (Signed)
[facility] with new onset AFib-- rate between 80-180-- received Cardizem 30mg  bolus-- , denies chest pain at present-- is belching consistently-- denies shortness of breath also.  Pt had to stand at bedside on arrival to urinate, no change in heart rate while standing.

## 2020-01-17 NOTE — Sedation Documentation (Signed)
Cardioverted 150 J

## 2020-01-17 NOTE — Sedation Documentation (Signed)
Cardioverted @200J 

## 2020-01-18 ENCOUNTER — Telehealth (HOSPITAL_COMMUNITY): Payer: Self-pay

## 2020-01-18 NOTE — Telephone Encounter (Signed)
Patient was contacted regarding ER follow up appointment. Dr.Camnitz his recommendations stated in the ER note was for patient to follow up in the Afib clinic to discuss new onset of Afib.  He will contact us back if he decides to come in for a consultation visit with the Afib Clinic once he discusses this with his mom. Consulted with patient.

## 2020-01-19 ENCOUNTER — Ambulatory Visit
Admission: RE | Admit: 2020-01-19 | Discharge: 2020-01-19 | Disposition: A | Discharge status: Home / Self Care | Payer: BC Managed Care – PPO | Source: Ambulatory Visit | Attending: Family Medicine | Admitting: Family Medicine

## 2020-01-19 ENCOUNTER — Other Ambulatory Visit: Payer: Self-pay

## 2020-01-19 DIAGNOSIS — M25562 Pain in left knee: Secondary | ICD-10-CM

## 2020-01-20 ENCOUNTER — Telehealth (HOSPITAL_COMMUNITY): Payer: Self-pay | Admitting: Physician Assistant

## 2020-01-20 NOTE — Telephone Encounter (Signed)
I called patient to schedule Echocardiogram from the order in the [facility] and patient declined to have and states that he is going to follow up with Dr.Ghangi due to his family goes there and he wants to see him. No appt made and order removed from the [facility].

## 2020-01-25 ENCOUNTER — Other Ambulatory Visit: Payer: Self-pay

## 2020-01-25 ENCOUNTER — Encounter (HOSPITAL_COMMUNITY): Payer: Self-pay | Admitting: Physician Assistant

## 2020-01-25 ENCOUNTER — Ambulatory Visit (HOSPITAL_COMMUNITY)
Admission: RE | Admit: 2020-01-25 | Discharge: 2020-01-25 | Disposition: A | Discharge status: Home / Self Care | Payer: BC Managed Care – PPO | Source: Ambulatory Visit | Attending: Physician Assistant | Admitting: Physician Assistant

## 2020-01-25 VITALS — BP 138/88 | HR 73 | Ht 74.0 in | Wt >= 6400 oz

## 2020-01-25 DIAGNOSIS — Z7901 Long term (current) use of anticoagulants: Secondary | ICD-10-CM | POA: Insufficient documentation

## 2020-01-25 DIAGNOSIS — F17210 Nicotine dependence, cigarettes, uncomplicated: Secondary | ICD-10-CM | POA: Diagnosis not present

## 2020-01-25 DIAGNOSIS — G4733 Obstructive sleep apnea (adult) (pediatric): Secondary | ICD-10-CM | POA: Diagnosis not present

## 2020-01-25 DIAGNOSIS — R778 Other specified abnormalities of plasma proteins: Secondary | ICD-10-CM | POA: Diagnosis not present

## 2020-01-25 DIAGNOSIS — I10 Essential (primary) hypertension: Secondary | ICD-10-CM | POA: Diagnosis not present

## 2020-01-25 DIAGNOSIS — Z9884 Bariatric surgery status: Secondary | ICD-10-CM | POA: Insufficient documentation

## 2020-01-25 DIAGNOSIS — Z6841 Body Mass Index (BMI) 40.0 and over, adult: Secondary | ICD-10-CM | POA: Insufficient documentation

## 2020-01-25 DIAGNOSIS — D6869 Other thrombophilia: Secondary | ICD-10-CM | POA: Diagnosis not present

## 2020-01-25 DIAGNOSIS — E669 Obesity, unspecified: Secondary | ICD-10-CM | POA: Diagnosis not present

## 2020-01-25 DIAGNOSIS — I480 Paroxysmal atrial fibrillation: Secondary | ICD-10-CM | POA: Insufficient documentation

## 2020-01-25 MED ORDER — DILTIAZEM HCL ER COATED BEADS 240 MG PO CP24: 240.0000 mg | ORAL_CAPSULE | Freq: Every day | ORAL | 3 refills | Status: DC

## 2020-01-25 NOTE — Progress Notes (Signed)
Primary Care Physician: Georgann Housekeeper, MD Primary Cardiologist: Dr Jacinto Halim (new) Primary Electrophysiologist: Dr Elberta Fortis Referring Physician: Dr Camnitz   Joseph Lang is a 44 y.o. male with a history of obstructive sleep apnea (on CPAP), s/p gastric sleeve, history of pulmonary embolism and DVT, hypertension, obesity, family history of CAD and ongoing tobacco smoking and new onset paroxysmal atrial fibrillation who presents for follow up in the [facility].  The patient was initially diagnosed with atrial fibrillation 01/17/20 after presenting to the ED with symptoms of dizziness, lightheadedness, and palpitations. He was found to be in rapid afib and underwent DCCV x3 which was unsuccessful. Dr Elberta Fortis consulted and patient had one additional DCCV with pressure which was successful. Troponin was elevated which was attributed to demand ischemia. Patient is on Xarelto for a CHADS2VASC score of 2. Since then, patient has had intermittent palpitations which last 1-2 seconds but occur frequently. He is in SR today. He denies any bleeding issues with anticoagulation.   Today, he denies symptoms of palpitations, chest pain, shortness of breath, orthopnea, PND, lower extremity edema, dizziness, presyncope, syncope, snoring, daytime somnolence, bleeding, or neurologic sequela. The patient is tolerating medications without difficulties and is otherwise without complaint today.    Atrial Fibrillation Risk Factors:  he does have symptoms or diagnosis of sleep apnea. he is compliant with CPAP therapy. he does not have a history of rheumatic fever. he does have a history of alcohol use. The patient does have a history of early familial atrial fibrillation or other arrhythmias. Mother and father had afib.  he has a BMI of Body mass index is 52.02 kg/m.Marland Kitchen Filed Weights   01/25/20 0829  Weight: (!) 183.8 kg    Family History  Problem Relation Age of Onset  . Atrial  fibrillation Mother   . CAD Mother   . CAD Father   . Atrial fibrillation Father      Atrial Fibrillation Management history:  Previous antiarrhythmic drugs: none Previous cardioversions: 01/17/20 Previous ablations: none CHADS2VASC score: 2 Anticoagulation history: Xarelto   Past Medical History:  Diagnosis Date  . Hx of deep vein thrombophlebitis of lower extremity   . Hx of gastric bypass   . Hx of pulmonary embolus   . Hypertension   . Obesity   . Sleep apnea   . Tobacco abuse    Past Surgical History:  Procedure Laterality Date  . HIP SURGERY    . LAPAROSCOPIC GASTRIC SLEEVE RESECTION N/A 02/14/2015   Procedure: LAPAROSCOPIC GASTRIC SLEEVE RESECTION WITH UPPER ENDOSCOPY;  Surgeon: Luretha Murphy, MD;  Location: [facility];  Service: General;  Laterality: N/A;  . PELVIS CLOSED REDUCTION      Current Outpatient Medications  Medication Sig Dispense Refill  . benazepril (LOTENSIN) 20 MG tablet Take 20 mg by mouth daily.    Marland Kitchen diltiazem (CARDIZEM CD) 240 MG 24 hr capsule Take 1 capsule (240 mg total) by mouth daily. 30 capsule 3  . RABEprazole (ACIPHEX) 20 MG tablet Take 20 mg by mouth 2 (two) times daily.    Carlena Hurl 20 MG TABS tablet Take 20 mg by mouth daily.     No current facility-administered medications for this encounter.    Allergies  Allergen Reactions  . Amoxicillin Nausea And Vomiting    Social History   Socioeconomic History  . Marital status: Single    Spouse name: Not on file  . Number of children: Not on file  . Years of education: Not  on file  . Highest education level: Not on file  Occupational History  . Not on file  Tobacco Use  . Smoking status: Current Every Day Smoker    Packs/day: 0.50    Years: 28.00    Pack years: 14.00    Types: Cigarettes  . Smokeless tobacco: Never Used  . Tobacco comment: half a pack daily  Substance and Sexual Activity  . Alcohol use: Not Currently    Comment: occ  . Drug use: No  . Sexual activity: Not on  file  Other Topics Concern  . Not on file  Social History Narrative   ** Merged History Encounter **       Social Determinants of Health   Financial Resource Strain:   . Difficulty of Paying Living Expenses:   Food Insecurity:   . Worried About Programme researcher, broadcasting/film/video in the Last Year:   . Barista in the Last Year:   Transportation Needs:   . Freight forwarder (Medical):   Marland Kitchen Lack of Transportation (Non-Medical):   Physical Activity:   . Days of Exercise per Week:   . Minutes of Exercise per Session:   Stress:   . Feeling of Stress :   Social Connections:   . Frequency of Communication with Friends and Family:   . Frequency of Social Gatherings with Friends and Family:   . Attends Religious Services:   . Active Member of Clubs or Organizations:   . Attends Banker Meetings:   Marland Kitchen Marital Status:   Intimate Partner Violence:   . Fear of Current or Ex-Partner:   . Emotionally Abused:   Marland Kitchen Physically Abused:   . Sexually Abused:      ROS- All systems are reviewed and negative except as per the HPI above.  Physical Exam: Vitals:   01/25/20 0829  BP: 138/88  Pulse: 73  Weight: (!) 183.8 kg  Height: 6\' 2"  (1.88 m)    GEN- The patient is well appearing obese male, alert and oriented x 3 today.   Head- normocephalic, atraumatic Eyes-  Sclera clear, conjunctiva pink Ears- hearing intact Oropharynx- clear Neck- supple  Lungs- Clear to ausculation bilaterally, normal work of breathing Heart- Regular rate and rhythm, no murmurs, rubs or gallops  GI- soft, NT, ND, + BS Extremities- no clubbing, cyanosis, or edema MS- no significant deformity or atrophy Skin- no rash or lesion Psych- euthymic mood, full affect Neuro- strength and sensation are intact  Wt Readings from Last 3 Encounters:  01/25/20 (!) 183.8 kg  03/07/19 (!) 188.2 kg  12/15/16 (!) 177.8 kg    EKG today demonstrates SR HR 73, PR 168, QRS 78, QTc 392  Epic records are reviewed at  length today  CHA2DS2-VASc Score = 2 The patient's score is based upon: CHF History: No HTN History: Yes Age : < 65 Diabetes History: No Stroke History: No Vascular Disease History: Yes Gender: Male      ASSESSMENT AND PLAN: 1. Paroxysmal Atrial Fibrillation (ICD10:  I48.0) The patient's CHA2DS2-VASc score is 2, indicating a 2.2% annual risk of stroke.   General education about afib provided and questions answered. We also discussed his stroke risk and the risks and benefits of anticoagulation.  Offered heart monitoring to better clarify palpitations, patient declined.  Increase diltiazem to 240 mg daily Continue Xarelto 20 mg daily Echocardiogram ordered. Lifestyle modification was discussed as below and including alcohol cessation.  2. Secondary Hypercoagulable State (ICD10:  D68.69) The patient  is at significant risk for stroke/thromboembolism based upon his CHA2DS2-VASc Score of 2.  Continue Rivaroxaban (Xarelto).   3. Obesity Body mass index is 52.02 kg/m. Lifestyle modification was discussed at length including regular exercise and weight reduction. S/p gastric sleeve.   4. Obstructive sleep apnea The importance of adequate treatment of sleep apnea was discussed today in order to improve our ability to maintain sinus rhythm long term. Patient reports compliance with CPAP therapy.  5. HTN Stable, no changes today.  6. Elevated troponin  Likely needs stress testing given strong family hx and demand ischemia. Will defer to primary cardiologist.    Follow up with Dr Einar Gip as scheduled. AF clinic as needed.    [facility] [address] [place], [place] [address] [phone] 01/25/2020 5:24 PM

## 2020-02-07 NOTE — Progress Notes (Signed)
Date:  02/08/2020   ID:  Joseph E Steinmiller, DOB [birth year], MRN [id number]  PCP:  Georgann Housekeeper, MD  Cardiologist:  Tessa Lerner, DO, [facility] (established care 02/08/2020) Electrophysiologist:  Dr. Loman Brooklyn (hospital consult 01/17/2020)  REASON FOR CONSULT: Atrial fibrillation.  REQUESTING PHYSICIAN:  Georgann Housekeeper, MD [address] [place],  [place] [address]  Chief Complaint  Patient presents with  . Atrial Fibrillation  . Hospitalization Follow-up    HPI  Joseph Lang is a 44 y.o. male who is being seen today for the evaluation of atrial fibrillation at the request of Georgann Housekeeper, MD. Patient's past medical history and cardiac risk factors include: Hx of DVT and PE, paroxymal Afib s/p cardioversion, hypertension, obesity, due to excess calories, OSA on CPAP, active smoking.  Patient is referred to the office at the request of his primary care provider for management of atrial fibrillation. Patient states that on Easter morning he was having increased palpitations he called EMS and was diagnosed with atrial fibrillation and taken to [facility], ER. Patient underwent cardioversion in normal sinus rhythm restored. Patient was already on oral anticoagulation given his history of DVT and PE. After cardioversion patient was discharged home and he followed up with atrial fibrillation clinic. Patient states that the atrial fibrillation clinic his diltiazem was increased to 240 mg p.o. daily. He comes to our practice to establish care as both his parents have received care in the past.   Has history of gastric bypass surgery.  He has underlying diagnosis of obstructive sleep apnea currently on CPAP.  He does not consume alcohol on a daily basis.  However when he is in the company of his friends he may consume 8 to 10 cans of beer in one sitting or fifth of liquor.  He continues to smoke approximately half a pack per day.  Office blood pressures were elevated but upon  recheck they did improve.  Currently blood pressures are being managed by his primary care provider.  ALLERGIES: Allergies  Allergen Reactions  . Amoxicillin Nausea And Vomiting    MEDICATION LIST PRIOR TO VISIT: Current Meds  Medication Sig  . benazepril (LOTENSIN) 20 MG tablet Take 20 mg by mouth daily.  Marland Kitchen diltiazem (CARDIZEM CD) 240 MG 24 hr capsule Take 1 capsule (240 mg total) by mouth daily.  . RABEprazole (ACIPHEX) 20 MG tablet Take 20 mg by mouth 2 (two) times daily.  Carlena Hurl 20 MG TABS tablet Take 20 mg by mouth daily.     PAST MEDICAL HISTORY: Past Medical History:  Diagnosis Date  . Hx of deep vein thrombophlebitis of lower extremity   . Hx of gastric bypass   . Hx of pulmonary embolus   . Hypertension   . Obesity   . Sleep apnea   . Tobacco abuse     PAST SURGICAL HISTORY: Past Surgical History:  Procedure Laterality Date  . CARDIOVERSION    . HIP SURGERY    . LAPAROSCOPIC GASTRIC SLEEVE RESECTION N/A 02/14/2015   Procedure: LAPAROSCOPIC GASTRIC SLEEVE RESECTION WITH UPPER ENDOSCOPY;  Surgeon: Luretha Murphy, MD;  Location: [facility];  Service: General;  Laterality: N/A;  . PELVIS CLOSED REDUCTION      FAMILY HISTORY: The patient family history includes Atrial fibrillation in his father and mother; CAD in his father and mother; Heart disease in his mother.  SOCIAL HISTORY:  The patient  reports that he has been smoking cigarettes. He has a 14.00 pack-year smoking history.  He has never used smokeless tobacco. He reports current alcohol use. He reports that he does not use drugs.  REVIEW OF SYSTEMS: Review of Systems  Constitution: Negative for chills and fever.  HENT: Negative for hoarse voice and nosebleeds.   Eyes: Negative for discharge, double vision and pain.  Cardiovascular: Negative for chest pain, claudication, dyspnea on exertion, leg swelling, near-syncope, orthopnea, palpitations, paroxysmal nocturnal dyspnea and syncope.  Respiratory: Negative  for hemoptysis and shortness of breath.   Musculoskeletal: Negative for muscle cramps and myalgias.  Gastrointestinal: Negative for abdominal pain, constipation, diarrhea, hematemesis, hematochezia, melena, nausea and vomiting.  Neurological: Negative for dizziness and light-headedness.    PHYSICAL EXAM: Vitals with BMI 02/08/2020 01/25/2020 01/17/2020  Height 6\' 2"  6\' 2"  -  Weight 409 lbs 8 oz 405 lbs 3 oz -  BMI 52.55 52 -  Systolic 142 138  Diastolic 80 88 91  Pulse 85 73 69    CONSTITUTIONAL: Well-developed and well-nourished. No acute distress.  SKIN: Skin is warm and dry. No rash noted. No cyanosis. No pallor. No jaundice HEAD: Normocephalic and atraumatic.  EYES: No scleral icterus MOUTH/THROAT: Moist oral membranes.  NECK: No JVD present. No thyromegaly noted. No carotid bruits  LYMPHATIC: No visible cervical adenopathy.  CHEST Normal respiratory effort. No intercostal retractions  LUNGS: Clear to auscultation bilaterally. No stridor. No wheezes. No rales.  CARDIOVASCULAR: Regular rate and rhythm, positive S1-S2, no murmurs rubs or gallops appreciated. ABDOMINAL: Obese, soft, nontender, nondistended, positive bowel sounds all 4 quadrants. EXTREMITIES: No peripheral edema  HEMATOLOGIC: No significant bruising NEUROLOGIC: Oriented to person, place, and time. Nonfocal. Normal muscle tone.  PSYCHIATRIC: Normal mood and affect. Normal behavior. Cooperative  CARDIAC DATABASE: EKG: 02/08/2020: Normal sinus rhythm, 81 bpm, normal axis, poor R wave progression, cannot rule out old anterior infarct.  No active injury pattern.  Echocardiogram: None  Stress Testing: None  Heart Catheterization: None  Vascular imaging: Ultrasound venous duplex: 03/07/2019: Right: Findings consistent with acute deep vein thrombosis involving the right popliteal vein. There is evidence of superficial vein thrombosis involving a varicose vein of the proximal, medial, right calf.  Left: No  evidence of common femoral vein obstruction.  LABORATORY DATA: CBC Latest Ref Rng & Units 01/17/2020 03/07/2019 12/15/2016  WBC 4.0 - 10.5 K/uL 9.9 11.4(H) 16.6(H)  Hemoglobin 13.0 - 17.0 g/dL 03/09/2019 02/14/2017 75.1  Hematocrit 39.0 - 52.0 % 50.6 45.2 42.2  Platelets 150 - 400 K/uL 243 284 264    CMP Latest Ref Rng & Units 01/17/2020 03/07/2019 12/15/2016  Glucose 70 - 99 mg/dL 03/09/2019) 02/14/2017) 778(E)  BUN 6 - 20 mg/dL 10 10 12   Creatinine 0.61 - 1.24 mg/dL 423(N 361(W  Sodium 135 - 145 mmol/L 142 142 141  Potassium 3.5 - 5.1 mmol/L 4.0 4.1 3.9  Chloride 98 - 111 mmol/L 107 108 108  CO2 22 - 32 mmol/L 22 24 24   Calcium 8.9 - 10.3 mg/dL 9.7 4.31 5.40  Total Protein 6.5 - 8.1 g/dL 6.6 - -  Total Bilirubin 0.3 - 1.2 mg/dL 0.9 - -  Alkaline Phos 38 - 126 U/L 71 - -  AST 15 - 41 U/L 22 - -  ALT 0 - 44 U/L 23 - -    Lipid Panel  No results found for: CHOL, TRIG, HDL, CHOLHDL, VLDL, LDLCALC, LDLDIRECT, LABVLDL  No results found for: HGBA1C No components found for: NTPROBNP Lab Results  Component Value Date   TSH 1.564 01/17/2020    IMPRESSION:  ICD-10-CM   1. Paroxysmal atrial fibrillation (HCC)  I48.0 EKG 12-Lead    PCV ECHOCARDIOGRAM COMPLETE    PCV MYOCARDIAL PERFUSION WITH LEXISCAN  2. Long term (current) use of anticoagulants  Z79.01   3. History of cardioversion  Z98.890   4. OSA on CPAP  G47.33    Z99.89   5. Hx of deep venous thrombosis  Z86.718   6. Hx of pulmonary embolus  Z86.711   7. Benign hypertension  I10   8. Smoking  F17.200   9. Class 3 severe obesity due to excess calories with serious comorbidity and body mass index (BMI) of 50.0 to 59.9 in adult [facility])  E66.01    Z68.43      RECOMMENDATIONS: Mali E Effertz is a 44 y.o. male whose past medical history and cardiac risk factors include:  Hx of DVT and PE, paroxymal Afib s/p cardioversion, hypertension, obesity, due to excess calories, OSA on CPAP, active smoking.  Atrial fibrillation, paroxysmal: . I spent a  considerable amount of time reviewing Afib, natural history, pathophysiology, rate control vs rhythm control strategy and risk for stroke.   . CHA2DS2-VASc SCORE is 1 (HTN) which correlates to 1.3 % risk of stroke per year.  . Patient already on Xarelto given his history of DVT and PE. Marland Kitchen Reemphasized the risks, benefits, and alternatives to oral anticoagulation. . Plan echocardiogram. . Plan nuclear stress test to rule out reversible ischemia given the new diagnosis of atrial fibrillation. . Patient is compliant with his CPAP.  Given his underlying diagnosis of sleep apnea. . Educated on the importance of decreasing alcohol consumption. . Educated on the importance of lifestyle modification.  Encouraged 30 minutes of walking on a daily basis 5 days a week as tolerated. . Patient will follow up with primary in regards to having his cholesterol checked and being screened for diabetes. . Patient's blood pressure currently managed by primary team.  Long-term oral anticoagulation:  Indication: History of DVT and PE.  Newly diagnosed paroxysmal atrial fibrillation status post cardioversion.  Benign essential hypertension: Currently on antihypertensive medications and followed by primary team.  Obesity, due to excess calories: Body mass index is 52.58 kg/m. . I reviewed with the patient the importance of diet, regular physical activity/exercise, weight loss.   . Patient is educated on increasing physical activity gradually as tolerated.  With the goal of moderate intensity exercise for 30 minutes a day 5 days a week.   FINAL MEDICATION LIST END OF ENCOUNTER:  Current Outpatient Medications:  .  benazepril (LOTENSIN) 20 MG tablet, Take 20 mg by mouth daily., Disp: , Rfl:  .  diltiazem (CARDIZEM CD) 240 MG 24 hr capsule, Take 1 capsule (240 mg total) by mouth daily., Disp: 30 capsule, Rfl: 3 .  RABEprazole (ACIPHEX) 20 MG tablet, Take 20 mg by mouth 2 (two) times daily., Disp: , Rfl:  .   XARELTO 20 MG TABS tablet, Take 20 mg by mouth daily., Disp: , Rfl:   Orders Placed This Encounter  Procedures  . PCV MYOCARDIAL PERFUSION WITH LEXISCAN  . EKG 12-Lead  . PCV ECHOCARDIOGRAM COMPLETE   --Continue cardiac medications as reconciled in final medication list. --Return in about 4 weeks (around 03/07/2020) for afib follow up.. Or sooner if needed. --Continue follow-up with your primary care physician regarding the management of your other chronic comorbid conditions.  Patient's questions and concerns were addressed to his satisfaction. He voices understanding of the instructions provided during this encounter.   This note  was created using a voice recognition software as a result there may be grammatical errors inadvertently enclosed that do not reflect the nature of this encounter. Every attempt is made to correct such errors.  Tessa Lerner, [place], [facility]  Pager: [phone] Office: [phone]

## 2020-02-08 ENCOUNTER — Encounter: Payer: Self-pay | Admitting: Cardiology

## 2020-02-08 ENCOUNTER — Other Ambulatory Visit: Payer: Self-pay

## 2020-02-08 ENCOUNTER — Ambulatory Visit: Payer: BC Managed Care – PPO | Admitting: Cardiology

## 2020-02-08 VITALS — BP 142/80 | HR 85 | Temp 96.9°F | Resp 12 | Ht 74.0 in | Wt >= 6400 oz

## 2020-02-08 DIAGNOSIS — I10 Essential (primary) hypertension: Secondary | ICD-10-CM

## 2020-02-08 DIAGNOSIS — Z6841 Body Mass Index (BMI) 40.0 and over, adult: Secondary | ICD-10-CM

## 2020-02-08 DIAGNOSIS — Z86711 Personal history of pulmonary embolism: Secondary | ICD-10-CM

## 2020-02-08 DIAGNOSIS — Z86718 Personal history of other venous thrombosis and embolism: Secondary | ICD-10-CM

## 2020-02-08 DIAGNOSIS — Z9889 Other specified postprocedural states: Secondary | ICD-10-CM

## 2020-02-08 DIAGNOSIS — Z7901 Long term (current) use of anticoagulants: Secondary | ICD-10-CM

## 2020-02-08 DIAGNOSIS — I480 Paroxysmal atrial fibrillation: Secondary | ICD-10-CM

## 2020-02-08 DIAGNOSIS — Z9989 Dependence on other enabling machines and devices: Secondary | ICD-10-CM

## 2020-02-08 DIAGNOSIS — F17200 Nicotine dependence, unspecified, uncomplicated: Secondary | ICD-10-CM

## 2020-02-08 DIAGNOSIS — G4733 Obstructive sleep apnea (adult) (pediatric): Secondary | ICD-10-CM

## 2020-02-17 ENCOUNTER — Ambulatory Visit: Payer: Self-pay | Admitting: Cardiology

## 2020-02-29 ENCOUNTER — Ambulatory Visit: Payer: BC Managed Care – PPO

## 2020-02-29 ENCOUNTER — Other Ambulatory Visit: Payer: Self-pay

## 2020-02-29 ENCOUNTER — Telehealth: Payer: Self-pay

## 2020-02-29 DIAGNOSIS — I480 Paroxysmal atrial fibrillation: Secondary | ICD-10-CM

## 2020-02-29 NOTE — Telephone Encounter (Signed)
Will inform the patient of the results once read.

## 2020-02-29 NOTE — Telephone Encounter (Signed)
Please let patient know that the heart function is normal. There were minor abnormalities noted on echo, but will discuss on OV. No valve problems.

## 2020-03-01 ENCOUNTER — Telehealth: Payer: Self-pay

## 2020-03-01 DIAGNOSIS — R9439 Abnormal result of other cardiovascular function study: Secondary | ICD-10-CM

## 2020-03-01 DIAGNOSIS — Z6841 Body Mass Index (BMI) 40.0 and over, adult: Secondary | ICD-10-CM | POA: Insufficient documentation

## 2020-03-01 DIAGNOSIS — G4733 Obstructive sleep apnea (adult) (pediatric): Secondary | ICD-10-CM | POA: Insufficient documentation

## 2020-03-01 DIAGNOSIS — Z9989 Dependence on other enabling machines and devices: Secondary | ICD-10-CM

## 2020-03-01 DIAGNOSIS — I480 Paroxysmal atrial fibrillation: Secondary | ICD-10-CM

## 2020-03-01 DIAGNOSIS — E66813 Obesity, class 3: Secondary | ICD-10-CM | POA: Insufficient documentation

## 2020-03-01 NOTE — Telephone Encounter (Signed)
Per Dr. Odis Hollingshead informed me to call Joseph Lang to inform him about his stress test. Dr. Odis Hollingshead mention he was high risk and would like to see him on Monday. Joseph Lang mention he is going out of town and would like to be seen before his vacation. Joseph Lang has an appt with Dr. Rosemary Holms tomorrow to go over his stress test.

## 2020-03-01 NOTE — Telephone Encounter (Signed)
Thanks for following up and setting up a followup visit.

## 2020-03-01 NOTE — Telephone Encounter (Signed)
Called pt to inform hm about his echo result. Pt understood and will make a fu appt when he comes back from his vacation

## 2020-03-01 NOTE — Telephone Encounter (Signed)
Lexiscan Sestamibi stress test 02/29/2020: No previous exam available for comparison. Lexiscan/modified Bruce nuclear stress test performed using 1-day protocol. Stress EKG is non-diagnostic, as this is pharmacological stress test. In addition, stress EKG at 78% MPHR shows sinus tachycardia, nonspecific T wave inversion in inferior leads, frequent PVC's.  SPECT images show large sized, medium intensity, predominantly reversible perfusion defect in basal to apical, inferoseptal, inferior, and inferolateral myocadium. While study quality may be limited due to attenuation (BMI 52), ischemia cannot be excluded. Stress LVEF 65%. Recommend clinical correlation.  High risk study.   Discussed the above findings with the patient. 45 y/o Caucasian male with morbid obesity (BMI 52), OSA, smoker, paroxysmal Afib. He has no angina symptoms at this time. He has mild, unchanged shortness of breath. Stress test does show decreased perfusion uptake as above, thus making the stress test high risk. However, with BMI 52, artifact is very likely. His Afib is most certainly due to obesity and OSA, and unlikely due to ischemia. In absence of symptoms, do not think he warrants cardiac catheterization at this time.   He is going on a vacation for his birthday on [birth year]. I do not see any contraindication to this. After his vacation, he will continue follow up with Dr. Odis Hollingshead. In future, if he develops angina symptoms, could consider coronary angiography, and possible intervention.   Elder Negus, MD [facility] Pager: [phone] Office: [phone]

## 2020-03-02 ENCOUNTER — Ambulatory Visit: Payer: BC Managed Care – PPO | Admitting: Cardiology

## 2020-03-31 ENCOUNTER — Other Ambulatory Visit: Payer: Self-pay

## 2020-03-31 ENCOUNTER — Encounter: Payer: Self-pay | Admitting: Cardiology

## 2020-03-31 ENCOUNTER — Ambulatory Visit: Payer: BC Managed Care – PPO | Admitting: Cardiology

## 2020-03-31 VITALS — BP 146/81 | HR 70 | Ht 74.0 in | Wt >= 6400 oz

## 2020-03-31 DIAGNOSIS — Z7901 Long term (current) use of anticoagulants: Secondary | ICD-10-CM

## 2020-03-31 DIAGNOSIS — R9439 Abnormal result of other cardiovascular function study: Secondary | ICD-10-CM

## 2020-03-31 DIAGNOSIS — Z9989 Dependence on other enabling machines and devices: Secondary | ICD-10-CM

## 2020-03-31 DIAGNOSIS — Z86711 Personal history of pulmonary embolism: Secondary | ICD-10-CM

## 2020-03-31 DIAGNOSIS — F17200 Nicotine dependence, unspecified, uncomplicated: Secondary | ICD-10-CM

## 2020-03-31 DIAGNOSIS — Z9889 Other specified postprocedural states: Secondary | ICD-10-CM

## 2020-03-31 DIAGNOSIS — I10 Essential (primary) hypertension: Secondary | ICD-10-CM

## 2020-03-31 DIAGNOSIS — Z86718 Personal history of other venous thrombosis and embolism: Secondary | ICD-10-CM

## 2020-03-31 DIAGNOSIS — I480 Paroxysmal atrial fibrillation: Secondary | ICD-10-CM

## 2020-03-31 DIAGNOSIS — G4733 Obstructive sleep apnea (adult) (pediatric): Secondary | ICD-10-CM

## 2020-03-31 MED ORDER — NITROGLYCERIN 0.4 MG SL SUBL: 0.4000 mg | SUBLINGUAL_TABLET | SUBLINGUAL | 0 refills | Status: DC | PRN

## 2020-03-31 NOTE — Progress Notes (Signed)
Date:  03/31/2020   ID:  Joseph Lang, DOB [birth year], MRN [id number]  PCP:  Georgann HousekeeperHusain, Karrar, MD  Cardiologist:  Tessa LernerSunit Nakeda Lebron, DO, [facility] (established care 02/08/2020) Electrophysiologist:  Dr. Loman BrooklynWill Camnitz (hospital consult 01/17/2020)  Last office visit: 02/08/2020  REQUESTING PHYSICIAN:  Georgann HousekeeperHusain, Karrar, MD [address] [place] [id number]  Chief Complaint  Patient presents with  . Atrial Fibrillation    tests   . Follow-up    HPI  Joseph Lang is a 44 y.o. male who presents to the office with a chief complaint of "review test results and management of atrial fibrillation."  Patient's past medical history and cardiac risk factors include: Hx of DVT and PE, paroxymal Afib s/p cardioversion, hypertension, obesity, due to excess calories, OSA on CPAP, active smoking.  Patient presented to the office back in April 2021 for evaluation and management of atrial fibrillation.  At that time patient [facility] [place] for increased palpitations and was diagnosed with atrial fibrillation.  Because he was on anticoagulation given his history of DVT and PE he was cardioverted in the ER to normal sinus rhythm and was asked to follow-up with cardiology as outpatient.  At the last office visit patient remained in normal sinus rhythm and was tolerating his oral medications well without any side effects or intolerances.  He is already on oral anticoagulation given his former history of DVT and PE.  He does not endorse any evidence of bleeding.  Given the new diagnosis of atrial fibrillation he underwent an echocardiogram and nuclear stress test.  Results were reviewed with him in great detail at today's office visit.  Patient's nuclear stress test is concerning for reversible ischemia reported as high risk study.  The images of the study were reviewed with the patient in great detail today.  His questions were addressed to his  satisfaction.  Patient states that in regards to chest pain he does have at times right-sided anterior wall chest pain that is usually brought on by stressful situations.  Duration of symptoms cannot be quantified.  At times his symptoms are improved by burping.  The chest pain is not brought on by effort related activities.  But he does have effort related dyspnea 20 walks up a hill or takes garbage out.   EKG at today's office visit shows normal sinus rhythm without underlying ischemia or injury pattern.  Patient continues to use his CPAP for his underlying OSA.  Patient has significantly reduced the amount of alcohol consumption since last office visit.  He is also lost 9 pounds since last office visit.  Has history of gastric bypass surgery.  He continues to smoke approximately half a pack per day.  ALLERGIES: Allergies  Allergen Reactions  . Amoxicillin Nausea And Vomiting    MEDICATION LIST PRIOR TO VISIT: Current Meds  Medication Sig  . benazepril (LOTENSIN) 20 MG tablet Take 20 mg by mouth daily.  Marland Kitchen. diltiazem (CARDIZEM CD) 240 MG 24 hr capsule Take 1 capsule (240 mg total) by mouth daily.  . RABEprazole (ACIPHEX) 20 MG tablet Take 20 mg by mouth 2 (two) times daily.  Carlena Hurl. XARELTO 20 MG TABS tablet Take 20 mg by mouth daily.     PAST MEDICAL HISTORY: Past Medical History:  Diagnosis Date  . Hx of deep vein thrombophlebitis of lower extremity   . Hx of gastric bypass   . Hx of pulmonary embolus   . Hypertension   .  Obesity   . Sleep apnea   . Tobacco abuse     PAST SURGICAL HISTORY: Past Surgical History:  Procedure Laterality Date  . CARDIOVERSION    . HIP SURGERY    . LAPAROSCOPIC GASTRIC SLEEVE RESECTION N/A 02/14/2015   Procedure: LAPAROSCOPIC GASTRIC SLEEVE RESECTION WITH UPPER ENDOSCOPY;  Surgeon: Luretha Murphy, MD;  Location: [facility];  Service: General;  Laterality: N/A;  . PELVIS CLOSED REDUCTION      FAMILY HISTORY: The patient family history includes  Atrial fibrillation in his father and mother; CAD in his father and mother; Heart disease in his mother.  SOCIAL HISTORY:  The patient  reports that he has been smoking cigarettes. He has a 14.00 pack-year smoking history. He has never used smokeless tobacco. He reports current alcohol use. He reports that he does not use drugs.  REVIEW OF SYSTEMS: Review of Systems  Constitutional: Negative for chills and fever.  HENT: Negative for hoarse voice and nosebleeds.   Eyes: Negative for discharge, double vision and pain.  Cardiovascular: Negative for chest pain, claudication, dyspnea on exertion, leg swelling, near-syncope, orthopnea, palpitations, paroxysmal nocturnal dyspnea and syncope.  Respiratory: Negative for hemoptysis and shortness of breath.   Musculoskeletal: Negative for muscle cramps and myalgias.  Gastrointestinal: Negative for abdominal pain, constipation, diarrhea, hematemesis, hematochezia, melena, nausea and vomiting.  Neurological: Negative for dizziness and light-headedness.    PHYSICAL EXAM: Vitals with BMI 03/31/2020 02/08/2020 01/25/2020  Height 6\' 2"  6\' 2"  6\' 2"   Weight 400 lbs 409 lbs 8 oz 405 lbs 3 oz  BMI 51.34 52.55 52  Systolic 146 142  Diastolic 81 80 88  Pulse 70 85 73    CONSTITUTIONAL: Well-developed and well-nourished. No acute distress.  SKIN: Skin is warm and dry. No rash noted. No cyanosis. No pallor. No jaundice HEAD: Normocephalic and atraumatic.  EYES: No scleral icterus MOUTH/THROAT: Moist oral membranes.  NECK: No JVD present. No thyromegaly noted. No carotid bruits  LYMPHATIC: No visible cervical adenopathy.  CHEST Normal respiratory effort. No intercostal retractions  LUNGS: Clear to auscultation bilaterally. No stridor. No wheezes. No rales.  CARDIOVASCULAR: Regular rate and rhythm, positive S1-S2, no murmurs rubs or gallops appreciated. ABDOMINAL: Obese, soft, nontender, nondistended, positive bowel sounds all 4 quadrants. EXTREMITIES:  No peripheral edema  HEMATOLOGIC: No significant bruising NEUROLOGIC: Oriented to person, place, and time. Nonfocal. Normal muscle tone.  PSYCHIATRIC: Normal mood and affect. Normal behavior. Cooperative  CARDIAC DATABASE: EKG: 03/31/2020: Normal sinus rhythm, 74 bpm, normal axis, cannot rule out old anterior infarct, without underlying ischemia or injury pattern.  Echocardiogram: 02/29/2020: LVEF 55-60%, mildly dilated left ventricular cavity, mild LVH, normal diastolic filling pattern, normal left atrial pressure, mildly dilated left atrium, mild MR, trace TR.  Stress Testing: Lexiscan Sestamibi stress test 02/29/2020: Stress EKG at 78% MPHR shows sinus tachycardia, nonspecific T wave inversion in inferior leads, frequent PVC's.  SPECT images show large sized, medium intensity, predominantly reversible perfusion defect in basal to apical, inferoseptal, inferior, and inferolateral myocadium. While study quality may be limited due to attenuation (BMI 52), ischemia cannot be excluded. Stress LVEF 65%. Recommend clinical correlation. High risk study.   Heart Catheterization: None  Vascular imaging: Ultrasound venous duplex: 03/07/2019: Right: Findings consistent with acute deep vein thrombosis involving the right popliteal vein. There is evidence of superficial vein thrombosis involving a varicose vein of the proximal, medial, right calf.  Left: No evidence of common femoral vein obstruction.  LABORATORY DATA: CBC Latest Ref Rng & Units  01/17/2020 03/07/2019 12/15/2016  WBC 4.0 - 10.5 K/uL 9.9 11.4(H) 16.6(H)  Hemoglobin 13.0 - 17.0 g/dL 47.0 96.2 83.6  Hematocrit 39 - 52 % 50.6 45.2 42.2  Platelets 150 - 400 K/uL 243 284 264    CMP Latest Ref Rng & Units 01/17/2020 03/07/2019 12/15/2016  Glucose 70 - 99 mg/dL 629(U) 765(Y) 650(P)  BUN 6 - 20 mg/dL 10 10 12   Creatinine 0.61 - 1.24 mg/dL 5.46 5.68  Sodium 135 - 145 mmol/L 142 142 141  Potassium 3.5 - 5.1 mmol/L 4.0 4.1 3.9  Chloride 98 -  111 mmol/L 107 108 108  CO2 22 - 32 mmol/L 22 24 24   Calcium 8.9 - 10.3 mg/dL 9.7 1.27  Total Protein 6.5 - 8.1 g/dL 6.6 - -  Total Bilirubin 0.3 - 1.2 mg/dL 0.9 - -  Alkaline Phos 38 - 126 U/L 71 - -  AST 15 - 41 U/L 22 - -  ALT 0 - 44 U/L 23 - -    Lipid Panel  No results found for: CHOL, TRIG, HDL, CHOLHDL, VLDL, LDLCALC, LDLDIRECT, LABVLDL  No results found for: HGBA1C No components found for: NTPROBNP Lab Results  Component Value Date   TSH 1.564 01/17/2020    IMPRESSION:    ICD-10-CM   1. Abnormal stress test  R94.39 nitroGLYCERIN (NITROSTAT) 0.4 MG SL tablet  2. Paroxysmal atrial fibrillation (HCC)  I48.0 EKG 12-Lead  3. Long term (current) use of anticoagulants  Z79.01   4. History of cardioversion  Z98.890   5. OSA on CPAP  G47.33    Z99.89   6. Hx of pulmonary embolus  Z86.711   7. Hx of deep venous thrombosis  Z86.718   8. Smoking  F17.200   9. Benign hypertension  I10   10. Class 3 severe obesity due to excess calories with serious comorbidity and body mass index (BMI) of 50.0 to 59.9 in adult [facility])  E66.01    Z68.43      RECOMMENDATIONS: 03/18/2020 E Keltner is a 44 y.o. male whose past medical history and cardiac risk factors include:  Hx of DVT and PE, paroxymal Afib s/p cardioversion, hypertension, obesity, due to excess calories, OSA on CPAP, active smoking.  Abnormal nuclear stress test:  Given his new diagnosis of atrial fibrillation underwent an ischemic evaluation including a nuclear stress test.  The results of the nuclear stress test including the images were reviewed with the patient at today's office visit.  I independently reviewed the study with the patient which notes a reversible perfusion defect as outlined above.  The overall specificity of the study is reduced due to soft tissue attenuation given his BMI of 51.  This was explained to the patient in great detail.  However chest pain and effort related dyspnea with multiple cardiovascular  risk factors including obesity, active tobacco use, family history of coronary disease, hypertension we discussed angiography versus coronary CTA.  Patient states that he is more inclined to proceed with left heart catheterization but would like to discuss it further with his family prior to proceeding.  The left heart catheterization procedure was explained to the patient in detail. The indication, alternatives, risks and benefits were reviewed. Complications including but not limited to bleeding, infection, acute kidney injury, blood transfusion, heart rhythm disturbances, contrast (dye) reaction, damage to the arteries or nerves in the legs or hands, cerebrovascular accident, myocardial infarction, need for emergent bypass surgery, blood clots in the legs, possible need for emergent blood transfusion,  and rarely death were reviewed and discussed with the patient. The patient voices understanding and will call us back with his decision.  Patient refers to have a left heart catheterization with Dr. Adrian Prows as he had treated his parents in the past.  Continue calcium channel blockers.  Prescribed sublingual nitroglycerin tablets to use for as needed basis.  Medication profile discussed.  Patient does not have access to her tolerance function medication such as but not limited to Viagra/sildenafil, Cialis/tadalafil, Levitra/vardenafil.  Like to see him in close follow-up in 4 weeks to really evaluate his symptoms.  Patient agreeable with the plan of care.   Atrial fibrillation, paroxysmal: . I spent a considerable amount of time reviewing Afib, natural history, pathophysiology, rate control vs rhythm control strategy and risk for stroke.   . CHA2DS2-VASc SCORE is 1 (HTN) which correlates to 1.3 % risk of stroke per year.  . Patient already on Xarelto given his history of DVT and PE. Marland Kitchen Reemphasized the risks, benefits, and alternatives to oral anticoagulation. . Patient is compliant with his  CPAP.  Given his underlying diagnosis of sleep apnea. . Since last office visit he has reduced his alcohol consumption significantly. . Educated on the importance of lifestyle modification.  Encouraged 30 minutes of walking on a daily basis 5 days a week as tolerated.   Long-term oral anticoagulation:  Indication: History of DVT and PE.  Newly diagnosed paroxysmal atrial fibrillation status post cardioversion.  Benign essential hypertension: Currently on antihypertensive medications and followed by primary team.  Patient is asked to keep a log of his blood pressures and to review it with his primary care provider at the next office visit.  Would recommend a goal systolic blood pressures to be around 120 mmHg given his age and the fact he is not diabetic.   Obesity, due to excess calories: Body mass index is 51.36 kg/m. . I reviewed with the patient the importance of diet, regular physical activity/exercise, weight loss.   . Patient is educated on increasing physical activity gradually as tolerated.  With the goal of moderate intensity exercise for 30 minutes a day 5 days a week.   FINAL MEDICATION LIST END OF ENCOUNTER:  Current Outpatient Medications:  .  benazepril (LOTENSIN) 20 MG tablet, Take 20 mg by mouth daily., Disp: , Rfl:  .  diltiazem (CARDIZEM CD) 240 MG 24 hr capsule, Take 1 capsule (240 mg total) by mouth daily., Disp: 30 capsule, Rfl: 3 .  RABEprazole (ACIPHEX) 20 MG tablet, Take 20 mg by mouth 2 (two) times daily., Disp: , Rfl:  .  XARELTO 20 MG TABS tablet, Take 20 mg by mouth daily., Disp: , Rfl:  .  nitroGLYCERIN (NITROSTAT) 0.4 MG SL tablet, Place 1 tablet (0.4 mg total) under the tongue every 5 (five) minutes as needed for chest pain. If you require more than two tablets five minutes apart go to the nearest ER via EMS., Disp: 30 tablet, Rfl: 0  Orders Placed This Encounter  Procedures  . EKG 12-Lead   --Continue cardiac medications as reconciled in final medication  list. --Return in about 4 weeks (around 04/28/2020) for re-evaluation of symptoms.. Or sooner if needed. --Continue follow-up with your primary care physician regarding the management of your other chronic comorbid conditions.  Patient's questions and concerns were addressed to his satisfaction. He voices understanding of the instructions provided during this encounter.   This note was created using a voice recognition software as a result there may be  grammatical errors inadvertently enclosed that do not reflect the nature of this encounter. Every attempt is made to correct such errors.  Tessa Lerner, [place], [facility]  Pager: [phone] Office: [phone]

## 2020-03-31 NOTE — H&P (View-Only) (Signed)
 Date:  03/31/2020   ID:  Joseph Lang, DOB 05/18/1976, MRN [id number]  PCP:  Husain, Karrar, MD  Cardiologist:  Chamar Broughton, DO, FACC (established care 02/08/2020) Electrophysiologist:  Dr. Will Camnitz (hospital consult 01/17/2020)  Last office visit: 02/08/2020  REQUESTING PHYSICIAN:  Husain, Karrar, MD [address] [place],  [place] [address]  Chief Complaint  Patient presents with  . Atrial Fibrillation    tests   . Follow-up    HPI  Joseph Lang is a 44 y.o. male who presents to the office with a chief complaint of "review test results and management of atrial fibrillation."  Patient's past medical history and cardiac risk factors include: Hx of DVT and PE, paroxymal Afib s/p cardioversion, hypertension, obesity, due to excess calories, OSA on CPAP, active smoking.  Patient presented to the office back in April 2021 for evaluation and management of atrial fibrillation.  At that time patient was taken to [facility] around Easter Sunday for increased palpitations and was diagnosed with atrial fibrillation.  Because he was on anticoagulation given his history of DVT and PE he was cardioverted in the ER to normal sinus rhythm and was asked to follow-up with cardiology as outpatient.  At the last office visit patient remained in normal sinus rhythm and was tolerating his oral medications well without any side effects or intolerances.  He is already on oral anticoagulation given his former history of DVT and PE.  He does not endorse any evidence of bleeding.  Given the new diagnosis of atrial fibrillation he underwent an echocardiogram and nuclear stress test.  Results were reviewed with him in great detail at today's office visit.  Patient's nuclear stress test is concerning for reversible ischemia reported as high risk study.  The images of the study were reviewed with the patient in great detail today.  His questions were addressed to his  satisfaction.  Patient states that in regards to chest pain he does have at times right-sided anterior wall chest pain that is usually brought on by stressful situations.  Duration of symptoms cannot be quantified.  At times his symptoms are improved by burping.  The chest pain is not brought on by effort related activities.  But he does have effort related dyspnea 20 walks up a hill or takes garbage out.   EKG at today's office visit shows normal sinus rhythm without underlying ischemia or injury pattern.  Patient continues to use his CPAP for his underlying OSA.  Patient has significantly reduced the amount of alcohol consumption since last office visit.  He is also lost 9 pounds since last office visit.  Has history of gastric bypass surgery.  He continues to smoke approximately half a pack per day.  ALLERGIES: Allergies  Allergen Reactions  . Amoxicillin Nausea And Vomiting    MEDICATION LIST PRIOR TO VISIT: Current Meds  Medication Sig  . benazepril (LOTENSIN) 20 MG tablet Take 20 mg by mouth daily.  . diltiazem (CARDIZEM CD) 240 MG 24 hr capsule Take 1 capsule (240 mg total) by mouth daily.  . RABEprazole (ACIPHEX) 20 MG tablet Take 20 mg by mouth 2 (two) times daily.  . XARELTO 20 MG TABS tablet Take 20 mg by mouth daily.     PAST MEDICAL HISTORY: Past Medical History:  Diagnosis Date  . Hx of deep vein thrombophlebitis of lower extremity   . Hx of gastric bypass   . Hx of pulmonary embolus   . Hypertension   .   Obesity   . Sleep apnea   . Tobacco abuse     PAST SURGICAL HISTORY: Past Surgical History:  Procedure Laterality Date  . CARDIOVERSION    . HIP SURGERY    . LAPAROSCOPIC GASTRIC SLEEVE RESECTION N/A 02/14/2015   Procedure: LAPAROSCOPIC GASTRIC SLEEVE RESECTION WITH UPPER ENDOSCOPY;  Surgeon: Luretha Murphy, MD;  Location: [facility];  Service: General;  Laterality: N/A;  . PELVIS CLOSED REDUCTION      FAMILY HISTORY: The patient family history includes  Atrial fibrillation in his father and mother; CAD in his father and mother; Heart disease in his mother.  SOCIAL HISTORY:  The patient  reports that he has been smoking cigarettes. He has a 14.00 pack-year smoking history. He has never used smokeless tobacco. He reports current alcohol use. He reports that he does not use drugs.  REVIEW OF SYSTEMS: Review of Systems  Constitutional: Negative for chills and fever.  HENT: Negative for hoarse voice and nosebleeds.   Eyes: Negative for discharge, double vision and pain.  Cardiovascular: Negative for chest pain, claudication, dyspnea on exertion, leg swelling, near-syncope, orthopnea, palpitations, paroxysmal nocturnal dyspnea and syncope.  Respiratory: Negative for hemoptysis and shortness of breath.   Musculoskeletal: Negative for muscle cramps and myalgias.  Gastrointestinal: Negative for abdominal pain, constipation, diarrhea, hematemesis, hematochezia, melena, nausea and vomiting.  Neurological: Negative for dizziness and light-headedness.    PHYSICAL EXAM: Vitals with BMI 03/31/2020 02/08/2020 01/25/2020  Height 6\' 2"  6\' 2"  6\' 2"   Weight 400 lbs 409 lbs 8 oz 405 lbs 3 oz  BMI 51.34 52.55 52  Systolic 146 142  Diastolic 81 80 88  Pulse 70 85 73    CONSTITUTIONAL: Well-developed and well-nourished. No acute distress.  SKIN: Skin is warm and dry. No rash noted. No cyanosis. No pallor. No jaundice HEAD: Normocephalic and atraumatic.  EYES: No scleral icterus MOUTH/THROAT: Moist oral membranes.  NECK: No JVD present. No thyromegaly noted. No carotid bruits  LYMPHATIC: No visible cervical adenopathy.  CHEST Normal respiratory effort. No intercostal retractions  LUNGS: Clear to auscultation bilaterally. No stridor. No wheezes. No rales.  CARDIOVASCULAR: Regular rate and rhythm, positive S1-S2, no murmurs rubs or gallops appreciated. ABDOMINAL: Obese, soft, nontender, nondistended, positive bowel sounds all 4 quadrants. EXTREMITIES:  No peripheral edema  HEMATOLOGIC: No significant bruising NEUROLOGIC: Oriented to person, place, and time. Nonfocal. Normal muscle tone.  PSYCHIATRIC: Normal mood and affect. Normal behavior. Cooperative  CARDIAC DATABASE: EKG: 03/31/2020: Normal sinus rhythm, 74 bpm, normal axis, cannot rule out old anterior infarct, without underlying ischemia or injury pattern.  Echocardiogram: 02/29/2020: LVEF 55-60%, mildly dilated left ventricular cavity, mild LVH, normal diastolic filling pattern, normal left atrial pressure, mildly dilated left atrium, mild MR, trace TR.  Stress Testing: Lexiscan Sestamibi stress test 02/29/2020: Stress EKG at 78% MPHR shows sinus tachycardia, nonspecific T wave inversion in inferior leads, frequent PVC's.  SPECT images show large sized, medium intensity, predominantly reversible perfusion defect in basal to apical, inferoseptal, inferior, and inferolateral myocadium. While study quality may be limited due to attenuation (BMI 52), ischemia cannot be excluded. Stress LVEF 65%. Recommend clinical correlation. High risk study.   Heart Catheterization: None  Vascular imaging: Ultrasound venous duplex: 03/07/2019: Right: Findings consistent with acute deep vein thrombosis involving the right popliteal vein. There is evidence of superficial vein thrombosis involving a varicose vein of the proximal, medial, right calf.  Left: No evidence of common femoral vein obstruction.  LABORATORY DATA: CBC Latest Ref Rng & Units  01/17/2020 03/07/2019 12/15/2016  WBC 4.0 - 10.5 K/uL 9.9 11.4(H) 16.6(H)  Hemoglobin 13.0 - 17.0 g/dL 47.0 96.2 83.6  Hematocrit 39 - 52 % 50.6 45.2 42.2  Platelets 150 - 400 K/uL 243 284 264    CMP Latest Ref Rng & Units 01/17/2020 03/07/2019 12/15/2016  Glucose 70 - 99 mg/dL 629(U) 765(Y) 650(P)  BUN 6 - 20 mg/dL 10 10 12   Creatinine 0.61 - 1.24 mg/dL 5.46 5.68  Sodium 135 - 145 mmol/L 142 142 141  Potassium 3.5 - 5.1 mmol/L 4.0 4.1 3.9  Chloride 98 -  111 mmol/L 107 108 108  CO2 22 - 32 mmol/L 22 24 24   Calcium 8.9 - 10.3 mg/dL 9.7 1.27  Total Protein 6.5 - 8.1 g/dL 6.6 - -  Total Bilirubin 0.3 - 1.2 mg/dL 0.9 - -  Alkaline Phos 38 - 126 U/L 71 - -  AST 15 - 41 U/L 22 - -  ALT 0 - 44 U/L 23 - -    Lipid Panel  No results found for: CHOL, TRIG, HDL, CHOLHDL, VLDL, LDLCALC, LDLDIRECT, LABVLDL  No results found for: HGBA1C No components found for: NTPROBNP Lab Results  Component Value Date   TSH 1.564 01/17/2020    IMPRESSION:    ICD-10-CM   1. Abnormal stress test  R94.39 nitroGLYCERIN (NITROSTAT) 0.4 MG SL tablet  2. Paroxysmal atrial fibrillation (HCC)  I48.0 EKG 12-Lead  3. Long term (current) use of anticoagulants  Z79.01   4. History of cardioversion  Z98.890   5. OSA on CPAP  G47.33    Z99.89   6. Hx of pulmonary embolus  Z86.711   7. Hx of deep venous thrombosis  Z86.718   8. Smoking  F17.200   9. Benign hypertension  I10   10. Class 3 severe obesity due to excess calories with serious comorbidity and body mass index (BMI) of 50.0 to 59.9 in adult [facility])  E66.01    Z68.43      RECOMMENDATIONS: 03/18/2020 E Keltner is a 44 y.o. male whose past medical history and cardiac risk factors include:  Hx of DVT and PE, paroxymal Afib s/p cardioversion, hypertension, obesity, due to excess calories, OSA on CPAP, active smoking.  Abnormal nuclear stress test:  Given his new diagnosis of atrial fibrillation underwent an ischemic evaluation including a nuclear stress test.  The results of the nuclear stress test including the images were reviewed with the patient at today's office visit.  I independently reviewed the study with the patient which notes a reversible perfusion defect as outlined above.  The overall specificity of the study is reduced due to soft tissue attenuation given his BMI of 51.  This was explained to the patient in great detail.  However chest pain and effort related dyspnea with multiple cardiovascular  risk factors including obesity, active tobacco use, family history of coronary disease, hypertension we discussed angiography versus coronary CTA.  Patient states that he is more inclined to proceed with left heart catheterization but would like to discuss it further with his family prior to proceeding.  The left heart catheterization procedure was explained to the patient in detail. The indication, alternatives, risks and benefits were reviewed. Complications including but not limited to bleeding, infection, acute kidney injury, blood transfusion, heart rhythm disturbances, contrast (dye) reaction, damage to the arteries or nerves in the legs or hands, cerebrovascular accident, myocardial infarction, need for emergent bypass surgery, blood clots in the legs, possible need for emergent blood transfusion,  and rarely death were reviewed and discussed with the patient. The patient voices understanding and will call us back with his decision.  Patient refers to have a left heart catheterization with Dr. Adrian Prows as he had treated his parents in the past.  Continue calcium channel blockers.  Prescribed sublingual nitroglycerin tablets to use for as needed basis.  Medication profile discussed.  Patient does not have access to her tolerance function medication such as but not limited to Viagra/sildenafil, Cialis/tadalafil, Levitra/vardenafil.  Like to see him in close follow-up in 4 weeks to really evaluate his symptoms.  Patient agreeable with the plan of care.   Atrial fibrillation, paroxysmal: . I spent a considerable amount of time reviewing Afib, natural history, pathophysiology, rate control vs rhythm control strategy and risk for stroke.   . CHA2DS2-VASc SCORE is 1 (HTN) which correlates to 1.3 % risk of stroke per year.  . Patient already on Xarelto given his history of DVT and PE. Marland Kitchen Reemphasized the risks, benefits, and alternatives to oral anticoagulation. . Patient is compliant with his  CPAP.  Given his underlying diagnosis of sleep apnea. . Since last office visit he has reduced his alcohol consumption significantly. . Educated on the importance of lifestyle modification.  Encouraged 30 minutes of walking on a daily basis 5 days a week as tolerated.   Long-term oral anticoagulation:  Indication: History of DVT and PE.  Newly diagnosed paroxysmal atrial fibrillation status post cardioversion.  Benign essential hypertension: Currently on antihypertensive medications and followed by primary team.  Patient is asked to keep a log of his blood pressures and to review it with his primary care provider at the next office visit.  Would recommend a goal systolic blood pressures to be around 120 mmHg given his age and the fact he is not diabetic.   Obesity, due to excess calories: Body mass index is 51.36 kg/m. . I reviewed with the patient the importance of diet, regular physical activity/exercise, weight loss.   . Patient is educated on increasing physical activity gradually as tolerated.  With the goal of moderate intensity exercise for 30 minutes a day 5 days a week.   FINAL MEDICATION LIST END OF ENCOUNTER:  Current Outpatient Medications:  .  benazepril (LOTENSIN) 20 MG tablet, Take 20 mg by mouth daily., Disp: , Rfl:  .  diltiazem (CARDIZEM CD) 240 MG 24 hr capsule, Take 1 capsule (240 mg total) by mouth daily., Disp: 30 capsule, Rfl: 3 .  RABEprazole (ACIPHEX) 20 MG tablet, Take 20 mg by mouth 2 (two) times daily., Disp: , Rfl:  .  XARELTO 20 MG TABS tablet, Take 20 mg by mouth daily., Disp: , Rfl:  .  nitroGLYCERIN (NITROSTAT) 0.4 MG SL tablet, Place 1 tablet (0.4 mg total) under the tongue every 5 (five) minutes as needed for chest pain. If you require more than two tablets five minutes apart go to the nearest ER via EMS., Disp: 30 tablet, Rfl: 0  Orders Placed This Encounter  Procedures  . EKG 12-Lead   --Continue cardiac medications as reconciled in final medication  list. --Return in about 4 weeks (around 04/28/2020) for re-evaluation of symptoms.. Or sooner if needed. --Continue follow-up with your primary care physician regarding the management of your other chronic comorbid conditions.  Patient's questions and concerns were addressed to his satisfaction. He voices understanding of the instructions provided during this encounter.   This note was created using a voice recognition software as a result there may be  grammatical errors inadvertently enclosed that do not reflect the nature of this encounter. Every attempt is made to correct such errors.  Tessa Lerner, [place], [facility]  Pager: [phone] Office: [phone]

## 2020-04-04 ENCOUNTER — Other Ambulatory Visit: Payer: Self-pay | Admitting: Cardiology

## 2020-04-04 DIAGNOSIS — R9439 Abnormal result of other cardiovascular function study: Secondary | ICD-10-CM

## 2020-04-09 LAB — BASIC METABOLIC PANEL
BUN/Creatinine Ratio: 13 (ref 9–20)
BUN: 10 mg/dL (ref 6–24)
CO2: 26 mmol/L (ref 20–29)
Calcium: 10.2 mg/dL (ref 8.7–10.2)
Chloride: 102 mmol/L (ref 96–106)
Creatinine, Ser: 0.8 mg/dL (ref 0.76–1.27)
GFR calc Af Amer: 124 mL/min/{1.73_m2} (ref >59)
GFR calc non Af Amer: 107 mL/min/{1.73_m2} (ref >59)
Glucose: 83 mg/dL (ref 65–99)
Potassium: 4.3 mmol/L (ref 3.5–5.2)
Sodium: 140 mmol/L (ref 134–144)

## 2020-04-09 LAB — CBC
Hematocrit: 46.3 % (ref 37.5–51.0)
Hemoglobin: 15.4 g/dL (ref 13.0–17.7)
MCH: 29.3 pg (ref 26.6–33.0)
MCHC: 33.3 g/dL (ref 31.5–35.7)
MCV: 88 fL (ref 79–97)
Platelets: 293 10*3/uL (ref 150–450)
RBC: 5.25 x10E6/uL (ref 4.14–5.80)
RDW: 13.3 % (ref 11.6–15.4)
WBC: 9.3 10*3/uL (ref 3.4–10.8)

## 2020-04-19 ENCOUNTER — Encounter (HOSPITAL_COMMUNITY)
Admission: RE | Disposition: A | Discharge status: Home / Self Care | Payer: Self-pay | Source: Home / Self Care | Attending: Cardiology

## 2020-04-19 ENCOUNTER — Ambulatory Visit (HOSPITAL_COMMUNITY): Payer: BC Managed Care – PPO | Admitting: Certified Registered Nurse Anesthetist

## 2020-04-19 ENCOUNTER — Encounter: Payer: Self-pay | Admitting: Cardiology

## 2020-04-19 ENCOUNTER — Other Ambulatory Visit: Payer: Self-pay

## 2020-04-19 ENCOUNTER — Ambulatory Visit (HOSPITAL_COMMUNITY)
Admission: RE | Admit: 2020-04-19 | Discharge: 2020-04-19 | Disposition: A | Discharge status: Home / Self Care | Payer: BC Managed Care – PPO | Attending: Cardiology | Admitting: Cardiology

## 2020-04-19 DIAGNOSIS — R9439 Abnormal result of other cardiovascular function study: Secondary | ICD-10-CM | POA: Diagnosis present

## 2020-04-19 DIAGNOSIS — Z86711 Personal history of pulmonary embolism: Secondary | ICD-10-CM | POA: Insufficient documentation

## 2020-04-19 DIAGNOSIS — I10 Essential (primary) hypertension: Secondary | ICD-10-CM | POA: Diagnosis not present

## 2020-04-19 DIAGNOSIS — F17200 Nicotine dependence, unspecified, uncomplicated: Secondary | ICD-10-CM | POA: Diagnosis not present

## 2020-04-19 DIAGNOSIS — Z880 Allergy status to penicillin: Secondary | ICD-10-CM | POA: Diagnosis not present

## 2020-04-19 DIAGNOSIS — Z8249 Family history of ischemic heart disease and other diseases of the circulatory system: Secondary | ICD-10-CM | POA: Insufficient documentation

## 2020-04-19 DIAGNOSIS — I25119 Atherosclerotic heart disease of native coronary artery with unspecified angina pectoris: Secondary | ICD-10-CM | POA: Insufficient documentation

## 2020-04-19 DIAGNOSIS — Z79899 Other long term (current) drug therapy: Secondary | ICD-10-CM | POA: Insufficient documentation

## 2020-04-19 DIAGNOSIS — Z7901 Long term (current) use of anticoagulants: Secondary | ICD-10-CM | POA: Insufficient documentation

## 2020-04-19 DIAGNOSIS — Z9884 Bariatric surgery status: Secondary | ICD-10-CM | POA: Insufficient documentation

## 2020-04-19 DIAGNOSIS — Z86718 Personal history of other venous thrombosis and embolism: Secondary | ICD-10-CM | POA: Insufficient documentation

## 2020-04-19 DIAGNOSIS — Z6841 Body Mass Index (BMI) 40.0 and over, adult: Secondary | ICD-10-CM | POA: Diagnosis not present

## 2020-04-19 DIAGNOSIS — G4733 Obstructive sleep apnea (adult) (pediatric): Secondary | ICD-10-CM | POA: Diagnosis not present

## 2020-04-19 DIAGNOSIS — I480 Paroxysmal atrial fibrillation: Secondary | ICD-10-CM | POA: Diagnosis present

## 2020-04-19 HISTORY — PX: LEFT HEART CATH AND CORONARY ANGIOGRAPHY: CATH118249

## 2020-04-19 HISTORY — PX: CARDIOVERSION: SHX1299

## 2020-04-19 LAB — POCT ACTIVATED CLOTTING TIME: Activated Clotting Time: 197 seconds

## 2020-04-19 SURGERY — LEFT HEART CATH AND CORONARY ANGIOGRAPHY
Anesthesia: LOCAL

## 2020-04-19 SURGERY — CARDIOVERSION
Anesthesia: General

## 2020-04-19 MED ORDER — MIDAZOLAM HCL 2 MG/2ML IJ SOLN
INTRAMUSCULAR | Status: DC | PRN
  Administered 2020-04-19: 1 mg via INTRAVENOUS
  Administered 2020-04-19: 2 mg via INTRAVENOUS

## 2020-04-19 MED ORDER — DILTIAZEM HCL 25 MG/5ML IV SOLN
15.0000 mg | Freq: Once | INTRAVENOUS | Status: DC
  Filled 2020-04-19: qty 5

## 2020-04-19 MED ORDER — METOPROLOL TARTRATE 5 MG/5ML IV SOLN
INTRAVENOUS | Status: AC
  Filled 2020-04-19: qty 5

## 2020-04-19 MED ORDER — HEPARIN SODIUM (PORCINE) 1000 UNIT/ML IJ SOLN
INTRAMUSCULAR | Status: AC
  Filled 2020-04-19: qty 1

## 2020-04-19 MED ORDER — IOHEXOL 350 MG/ML SOLN
INTRAVENOUS | Status: DC | PRN
  Administered 2020-04-19: 75 mL

## 2020-04-19 MED ORDER — ONDANSETRON HCL 4 MG/2ML IJ SOLN: 4.0000 mg | Freq: Four times a day (QID) | INTRAMUSCULAR | Status: DC | PRN

## 2020-04-19 MED ORDER — HEPARIN SODIUM (PORCINE) 1000 UNIT/ML IJ SOLN
INTRAMUSCULAR | Status: DC | PRN
  Administered 2020-04-19: 4000 [IU] via INTRAVENOUS
  Administered 2020-04-19: 5000 [IU] via INTRAVENOUS

## 2020-04-19 MED ORDER — NITROGLYCERIN 1 MG/10 ML FOR IR/CATH LAB
INTRA_ARTERIAL | Status: AC
  Filled 2020-04-19: qty 10

## 2020-04-19 MED ORDER — ACETAMINOPHEN 325 MG PO TABS
650.0000 mg | ORAL_TABLET | ORAL | Status: DC | PRN
  Filled 2020-04-19: qty 2

## 2020-04-19 MED ORDER — SODIUM CHLORIDE 0.9 % IV SOLN: INTRAVENOUS | Status: DC | PRN

## 2020-04-19 MED ORDER — ASPIRIN 81 MG PO CHEW
81.0000 mg | CHEWABLE_TABLET | ORAL | Status: AC
  Administered 2020-04-19: 81 mg via ORAL
  Filled 2020-04-19: qty 1

## 2020-04-19 MED ORDER — PROPOFOL 10 MG/ML IV BOLUS
INTRAVENOUS | Status: DC | PRN
  Administered 2020-04-19: 140 mg via INTRAVENOUS

## 2020-04-19 MED ORDER — FENTANYL CITRATE (PF) 100 MCG/2ML IJ SOLN
INTRAMUSCULAR | Status: DC | PRN
  Administered 2020-04-19: 50 ug via INTRAVENOUS
  Administered 2020-04-19: 25 ug via INTRAVENOUS

## 2020-04-19 MED ORDER — VERAPAMIL HCL 2.5 MG/ML IV SOLN
INTRAVENOUS | Status: DC | PRN
  Administered 2020-04-19: 5 mL via INTRA_ARTERIAL

## 2020-04-19 MED ORDER — LIDOCAINE HCL (PF) 1 % IJ SOLN
INTRAMUSCULAR | Status: AC
  Filled 2020-04-19: qty 30

## 2020-04-19 MED ORDER — MIDAZOLAM HCL 2 MG/2ML IJ SOLN
INTRAMUSCULAR | Status: AC
  Filled 2020-04-19: qty 2

## 2020-04-19 MED ORDER — SODIUM CHLORIDE 0.9% FLUSH: 3.0000 mL | INTRAVENOUS | Status: DC | PRN

## 2020-04-19 MED ORDER — SODIUM CHLORIDE 0.9% FLUSH: 3.0000 mL | Freq: Two times a day (BID) | INTRAVENOUS | Status: DC

## 2020-04-19 MED ORDER — NITROGLYCERIN 1 MG/10 ML FOR IR/CATH LAB
INTRA_ARTERIAL | Status: DC | PRN
  Administered 2020-04-19: 200 ug via INTRACORONARY

## 2020-04-19 MED ORDER — HEPARIN (PORCINE) IN NACL 1000-0.9 UT/500ML-% IV SOLN
INTRAVENOUS | Status: AC
  Filled 2020-04-19: qty 1000

## 2020-04-19 MED ORDER — SODIUM CHLORIDE 0.9 % WEIGHT BASED INFUSION: 1.0000 mL/kg/h | INTRAVENOUS | Status: DC

## 2020-04-19 MED ORDER — HEPARIN (PORCINE) IN NACL 1000-0.9 UT/500ML-% IV SOLN
INTRAVENOUS | Status: DC | PRN
  Administered 2020-04-19 (×2): 500 mL

## 2020-04-19 MED ORDER — HYDRALAZINE HCL 20 MG/ML IJ SOLN: 10.0000 mg | INTRAMUSCULAR | Status: DC | PRN

## 2020-04-19 MED ORDER — AMIODARONE HCL 200 MG PO TABS
200.0000 mg | ORAL_TABLET | Freq: Two times a day (BID) | ORAL | 2 refills | Status: DC
Start: 2020-04-19 — End: 2020-04-26

## 2020-04-19 MED ORDER — DILTIAZEM HCL 25 MG/5ML IV SOLN
15.0000 mg | Freq: Once | INTRAVENOUS | Status: AC
  Administered 2020-04-19: 15 mg via INTRAVENOUS
  Filled 2020-04-19: qty 5

## 2020-04-19 MED ORDER — SODIUM CHLORIDE 0.9 % IV SOLN: 250.0000 mL | INTRAVENOUS | Status: DC | PRN

## 2020-04-19 MED ORDER — VERAPAMIL HCL 2.5 MG/ML IV SOLN
INTRAVENOUS | Status: AC
  Filled 2020-04-19: qty 2

## 2020-04-19 MED ORDER — FENTANYL CITRATE (PF) 100 MCG/2ML IJ SOLN
INTRAMUSCULAR | Status: AC
  Filled 2020-04-19: qty 2

## 2020-04-19 MED ORDER — METOPROLOL TARTRATE 5 MG/5ML IV SOLN
INTRAVENOUS | Status: DC | PRN
  Administered 2020-04-19: 5 mg via INTRAVENOUS

## 2020-04-19 MED ORDER — LIDOCAINE HCL (PF) 1 % IJ SOLN
INTRAMUSCULAR | Status: DC | PRN
  Administered 2020-04-19: 2 mL

## 2020-04-19 MED ORDER — SODIUM CHLORIDE 0.9 % WEIGHT BASED INFUSION
3.0000 mL/kg/h | INTRAVENOUS | Status: AC
  Administered 2020-04-19: 3 mL/kg/h via INTRAVENOUS

## 2020-04-19 MED ORDER — LIDOCAINE 2% (20 MG/ML) 5 ML SYRINGE
INTRAMUSCULAR | Status: DC | PRN
  Administered 2020-04-19: 80 mg via INTRAVENOUS

## 2020-04-19 MED ORDER — ASPIRIN EC 81 MG PO TBEC
81.0000 mg | DELAYED_RELEASE_TABLET | Freq: Every day | ORAL | 3 refills | Status: DC
Start: 2020-04-19 — End: 2020-04-23

## 2020-04-19 SURGICAL SUPPLY — 9 items
CATH OPTITORQUE TIG 4.0 5F (CATHETERS) ×2 IMPLANT
DEVICE RAD COMP TR BAND LRG (VASCULAR PRODUCTS) ×2 IMPLANT
GLIDESHEATH SLEND A-KIT 6F 22G (SHEATH) ×2 IMPLANT
GUIDEWIRE INQWIRE 1.5J.035X260 (WIRE) ×1 IMPLANT
INQWIRE 1.5J .035X260CM (WIRE) ×2
KIT HEART LEFT (KITS) ×2 IMPLANT
PACK CARDIAC CATHETERIZATION (CUSTOM PROCEDURE TRAY) ×2 IMPLANT
TRANSDUCER W/STOPCOCK (MISCELLANEOUS) ×2 IMPLANT
TUBING CIL FLEX 10 FLL-RA (TUBING) ×2 IMPLANT

## 2020-04-19 NOTE — Transfer of Care (Signed)
Immediate Anesthesia Transfer of Care Note  Patient: [place] E Jamerson  Procedure(s) Performed: CARDIOVERSION (N/A )  Patient Location: Endoscopy Unit  Anesthesia Type:General  Level of Consciousness: awake, alert , oriented, patient cooperative and responds to stimulation  Airway & Oxygen Therapy: Patient Spontanous Breathing  Post-op Assessment: Report given to RN, Post -op Vital signs reviewed and stable and Patient moving all extremities X 4  Post vital signs: Reviewed and stable  Last Vitals:  Vitals Value Taken Time  BP 155/113 04/19/20 1400  Temp    Pulse 67 04/19/20 1401  Resp 12 04/19/20 1401  SpO2 97 % 04/19/20 1401  Vitals shown include unvalidated device data.  Last Pain:  Vitals:   04/19/20 1340  TempSrc:   PainSc: 0-No pain         Complications: No complications documented.

## 2020-04-19 NOTE — Progress Notes (Signed)
I discussed the findings of the cardiac catheterization with the patient and also his mother, also he has multivessel disease but went back into atrial fibrillation with rapid ventricular response while doing cardiac catheterization.  Patient is alert and oriented x3, he wants to proceed with direct-current cardioversion.  I discussed the risks, benefits for cardiac catheterization with patient and also his mother over the telephone, they both understand and they want to proceed.  I will try to keep him n.p.o. and see if I can schedule direct-current cardioversion sometime this afternoon.  Schedule for Direct current cardioversion. I have discussed regarding risks benefits rate control vs rhythm control with the patient. Patient understands cardiac arrest and need for CPR, aspiration pneumonia, but not limited to these. Patient is willing.    Yates Decamp, MD, [facility] 04/19/2020, 1:13 PM Office: [phone]

## 2020-04-19 NOTE — Progress Notes (Signed)
Direct current cardioversion:  Indication symptomatic A. Fibrillation.  Procedure: Using 140 mg of IV Propofol and 80 IV Lidocaine (for reducing venous pain) for achieving deep sedation, synchronized direct current cardioversion performed. Patient was delivered with 150x1 then followed by 200 Joules of electricity X 3 without success Patient tolerated the procedure well. No immediate complication noted.    Failed cardioversion. Will add amiodarone and consider re-attempt in 2-3 weeks or after CABG.   Yates Decamp, MD, [facility] 04/19/2020, 1:59 PM Office: [phone]

## 2020-04-19 NOTE — Interval H&P Note (Signed)
History and Physical Interval Note:  04/19/2020 11:35 AM  Joseph Lang  has presented today for surgery, with the diagnosis of afib.  The various methods of treatment have been discussed with the patient and family. After consideration of risks, benefits and other options for treatment, the patient has consented to  Procedure(s): LEFT HEART CATH AND CORONARY ANGIOGRAPHY (N/A) and possible angioplasty Cath Lab Visit (complete for each Cath Lab visit)  Clinical Evaluation Leading to the Procedure:   ACS: No.  Non-ACS:    Anginal Classification: CCS III  Anti-ischemic medical therapy: Minimal Therapy (1 class of medications)  Non-Invasive Test Results: Intermediate-risk stress test findings: cardiac mortality 1-3%/year  Prior CABG: No previous CABG as a surgical intervention.  The patient's history has been reviewed, patient examined, no change in status, stable for surgery.  I have reviewed the patient's chart and labs.  Questions were answered to the patient's satisfaction.     Yates Decamp

## 2020-04-19 NOTE — Discharge Instructions (Signed)
Electrical Cardioversion  Your blood pressure, heart rate, breathing rate, and blood oxygen level will be monitored until you leave the hospital or clinic.  Your heart rhythm will be watched to make sure it does not change.  You may have some redness on the skin where the shocks were given. Follow these instructions at home:  Do not drive for 24 hours if you were given a sedative during your procedure.  Take over-the-counter and prescription medicines only as told by your health care provider.  Ask your health care provider how to check your pulse. Check it often.  Rest for 48 hours after the procedure or as told by your health care provider.  Avoid or limit your caffeine use as told by your health care provider.  Keep all follow-up visits as told by your health care provider. This is important. Contact a health care provider if:  You feel like your heart is beating too quickly or your pulse is not regular.  You have a serious muscle cramp that does not go away. Get help right away if:  You have discomfort in your chest.  You are dizzy or you feel faint.  You have trouble breathing or you are short of breath.  Your speech is slurred.  You have trouble moving an arm or leg on one side of your body.  Your fingers or toes turn cold or blue. Summary  Electrical cardioversion is the delivery of a jolt of electricity to restore a normal rhythm to the heart.  This procedure may be done right away in an emergency or may be a scheduled procedure if the condition is not an emergency.  Generally, this is a safe procedure.  After the procedure, check your pulse often as told by your health care provider. This information is not intended to replace advice given to you by your health care provider. Make sure you discuss any questions you have with your health care provider. Document Revised: 05/04/2019 Document Reviewed: 05/04/2019 Elsevier Patient Education  2020 [employer]. Drink plenty of fluids for 48 hours and keep wrist elevated at heart level for 24 hours  Radial Site Care   This sheet gives you information about how to care for yourself after your procedure. Your health care provider may also give you more specific instructions. If you have problems or questions, contact your health care provider. What can I expect after the procedure? After the procedure, it is common to have:  Bruising and tenderness at the catheter insertion area. Follow these instructions at home: Medicines  Take over-the-counter and prescription medicines only as told by your health care provider. Insertion site care 1. Follow instructions from your health care provider about how to take care of your insertion site. Make sure you: ? Wash your hands with soap and water before you change your bandage (dressing). If soap and water are not available, use hand sanitizer. ? Remove your dressing as told by your health care provider. In 24 hours 2. Check your insertion site every day for signs of infection. Check for: ? Redness, swelling, or pain. ? Fluid or blood. ? Pus or a bad smell. ? Warmth. 3. Do not take baths, swim, or use a hot tub until your health care provider approves. 4. You may shower 24-48 hours after the procedure, or as directed by your health care provider. ? Remove the dressing and gently wash the site with plain soap and water. ? Pat the area dry with a clean towel. ?  Do not rub the site. That could cause bleeding. 5. Do not apply powder or lotion to the site. Activity   1. For 24 hours after the procedure, or as directed by your health care provider: ? Do not flex or bend the affected arm. ? Do not push or pull heavy objects with the affected arm. ? Do not drive yourself home from the hospital or clinic. You may drive 24 hours after the procedure unless your health care provider tells you not to. ? Do not operate machinery or power tools. 2. Do not  lift anything that is heavier than 10 lb (4.5 kg), or the limit that you are told, until your health care provider says that it is safe.  For 4 days 3. Ask your health care provider when it is okay to: ? Return to work or school. ? Resume usual physical activities or sports. ? Resume sexual activity. General instructions  If the catheter site starts to bleed, raise your arm and put firm pressure on the site. If the bleeding does not stop, get help right away. This is a medical emergency.  If you went home on the same day as your procedure, a responsible adult should be with you for the first 24 hours after you arrive home.  Keep all follow-up visits as told by your health care provider. This is important. Contact a health care provider if:  You have a fever.  You have redness, swelling, or yellow drainage around your insertion site. Get help right away if:  You have unusual pain at the radial site.  The catheter insertion area swells very fast.  The insertion area is bleeding, and the bleeding does not stop when you hold steady pressure on the area.  Your arm or hand becomes pale, cool, tingly, or numb. These symptoms may represent a serious problem that is an emergency. Do not wait to see if the symptoms will go away. Get medical help right away. Call your local emergency services (911 in the [place] Do not drive yourself to the hospital. Summary  After the procedure, it is common to have bruising and tenderness at the site.  Follow instructions from your health care provider about how to take care of your radial site wound. Check the wound every day for signs of infection.  Do not lift anything that is heavier than 10 lb (4.5 kg), or the limit that you are told, until your health care provider says that it is safe. This information is not intended to replace advice given to you by your health care provider. Make sure you discuss any questions you have with your health care  provider. Document Revised: 11/06/2017 Document Reviewed: 11/06/2017 Elsevier Patient Education  2020 [employer].

## 2020-04-20 ENCOUNTER — Telehealth: Payer: Self-pay

## 2020-04-20 ENCOUNTER — Encounter (HOSPITAL_COMMUNITY): Payer: Self-pay | Admitting: Cardiology

## 2020-04-20 NOTE — Telephone Encounter (Signed)
After speaking with patient, he was to be scheduled on Wed. 14th, 2021 @ 12pm . Patient had several questions for JG, so I spoke with Dr.Ganji and per his request, please schedule patient tomorrow, Thursday 8th, 2021 at 12pm. Patient is aware.

## 2020-04-20 NOTE — Telephone Encounter (Signed)
Patient called and stated that he just a heart cath yesterday. Patient stated that he is now in A-fib and SOB and said that you told him he needed an appt ASAP. Patient is really concerned because he wa also told he had 5 blockages. Do we need to schedule him ASAP? Please advise.

## 2020-04-21 ENCOUNTER — Other Ambulatory Visit: Payer: Self-pay

## 2020-04-21 ENCOUNTER — Ambulatory Visit: Payer: BC Managed Care – PPO | Admitting: Cardiology

## 2020-04-21 ENCOUNTER — Encounter: Payer: Self-pay | Admitting: Cardiology

## 2020-04-21 VITALS — BP 133/81 | HR 142 | Resp 16 | Ht 74.0 in | Wt >= 6400 oz

## 2020-04-21 DIAGNOSIS — I25118 Atherosclerotic heart disease of native coronary artery with other forms of angina pectoris: Secondary | ICD-10-CM

## 2020-04-21 DIAGNOSIS — Z6841 Body Mass Index (BMI) 40.0 and over, adult: Secondary | ICD-10-CM

## 2020-04-21 DIAGNOSIS — I10 Essential (primary) hypertension: Secondary | ICD-10-CM

## 2020-04-21 DIAGNOSIS — E7800 Pure hypercholesterolemia, unspecified: Secondary | ICD-10-CM

## 2020-04-21 DIAGNOSIS — I480 Paroxysmal atrial fibrillation: Secondary | ICD-10-CM

## 2020-04-22 ENCOUNTER — Other Ambulatory Visit (HOSPITAL_COMMUNITY)
Admission: RE | Admit: 2020-04-22 | Discharge: 2020-04-22 | Disposition: A | Discharge status: Home / Self Care | Payer: BC Managed Care – PPO | Source: Ambulatory Visit | Attending: Cardiology | Admitting: Cardiology

## 2020-04-22 ENCOUNTER — Other Ambulatory Visit (HOSPITAL_COMMUNITY): Payer: Self-pay | Admitting: *Deleted

## 2020-04-22 DIAGNOSIS — Z20822 Contact with and (suspected) exposure to covid-19: Secondary | ICD-10-CM | POA: Insufficient documentation

## 2020-04-22 DIAGNOSIS — Z01812 Encounter for preprocedural laboratory examination: Secondary | ICD-10-CM | POA: Diagnosis present

## 2020-04-22 LAB — SARS CORONAVIRUS 2 (TAT 6-24 HRS): SARS Coronavirus 2: NEGATIVE

## 2020-04-22 NOTE — Anesthesia Postprocedure Evaluation (Signed)
Anesthesia Post Note  Patient: Joseph Lang  Procedure(s) Performed: CARDIOVERSION (N/A )     Patient location during evaluation: Endoscopy Anesthesia Type: General Level of consciousness: awake and alert Pain management: pain level controlled Vital Signs Assessment: post-procedure vital signs reviewed and stable Respiratory status: spontaneous breathing, nonlabored ventilation, respiratory function stable and patient connected to nasal cannula oxygen Cardiovascular status: blood pressure returned to baseline and stable Postop Assessment: no apparent nausea or vomiting Anesthetic complications: no   No complications documented.  Last Vitals:  Vitals:   04/19/20 1450 04/19/20 1505  BP: (!) 144/104 (!) 145/111  Pulse: (!) 136 (!) 136  Resp:    Temp:    SpO2: 99% 97%    Last Pain:  Vitals:   04/19/20 1445  TempSrc:   PainSc: 0-No pain                 Braelin Costlow L Eugena Rhue

## 2020-04-22 NOTE — Anesthesia Preprocedure Evaluation (Signed)
Anesthesia Evaluation  Patient identified by MRN, date of birth, ID band Patient awake    Reviewed: Allergy & Precautions, NPO status , Patient's Chart, lab work & pertinent test results  Airway Mallampati: III  TM Distance: >3 FB Neck ROM: Full    Dental no notable dental hx. (+) Teeth Intact, Dental Advisory Given   Pulmonary sleep apnea and Continuous Positive Airway Pressure Ventilation , Patient abstained from smoking., former smoker, PE   Pulmonary exam normal breath sounds clear to auscultation       Cardiovascular hypertension, Pt. on medications + DVT  Normal cardiovascular exam+ dysrhythmias (on xarelto) Atrial Fibrillation  Rhythm:Regular Rate:Normal     Neuro/Psych negative neurological ROS  negative psych ROS   GI/Hepatic negative GI ROS, Neg liver ROS,   Endo/Other  Morbid obesity (BMI 52)  Renal/GU negative Renal ROS  negative genitourinary   Musculoskeletal negative musculoskeletal ROS (+)   Abdominal   Peds  Hematology negative hematology ROS (+)   Anesthesia Other Findings   Reproductive/Obstetrics                             Anesthesia Physical Anesthesia Plan  ASA: III  Anesthesia Plan: General   Post-op Pain Management:    Induction: Intravenous  PONV Risk Score and Plan: 2 and Propofol infusion and Treatment may vary due to age or medical condition  Airway Management Planned: Natural Airway  Additional Equipment:   Intra-op Plan:   Post-operative Plan:   Informed Consent: I have reviewed the patients History and Physical, chart, labs and discussed the procedure including the risks, benefits and alternatives for the proposed anesthesia with the patient or authorized representative who has indicated his/her understanding and acceptance.     Dental advisory given  Plan Discussed with: CRNA  Anesthesia Plan Comments:         Anesthesia Quick  Evaluation

## 2020-04-23 DIAGNOSIS — I2510 Atherosclerotic heart disease of native coronary artery without angina pectoris: Secondary | ICD-10-CM | POA: Insufficient documentation

## 2020-04-23 DIAGNOSIS — I10 Essential (primary) hypertension: Secondary | ICD-10-CM | POA: Insufficient documentation

## 2020-04-23 DIAGNOSIS — E7800 Pure hypercholesterolemia, unspecified: Secondary | ICD-10-CM | POA: Insufficient documentation

## 2020-04-23 MED ORDER — ATORVASTATIN CALCIUM 80 MG PO TABS: 80.0000 mg | ORAL_TABLET | Freq: Every day | ORAL | 2 refills | Status: DC

## 2020-04-23 MED ORDER — METOPROLOL SUCCINATE ER 50 MG PO TB24: 50.0000 mg | ORAL_TABLET | Freq: Every day | ORAL | 2 refills | Status: DC

## 2020-04-23 MED ORDER — ASPIRIN EC 81 MG PO TBEC: 81.0000 mg | DELAYED_RELEASE_TABLET | Freq: Every day | ORAL | 3 refills | Status: AC

## 2020-04-23 NOTE — Progress Notes (Signed)
Primary Physician/Referring:  Georgann Housekeeper, MD  Patient ID: Joseph Lang, male    DOB: 1975/12/18, 44 y.o.   MRN: [id number]  Chief Complaint  Patient presents with  . Follow-up  . Heart Catheterization   HPI:   Visit date not found  Joseph Lang  is a 44 y.o.  White or Caucasian [1] male patient with past medical history of paroxysmal atrial fibrillation and cardioversion on 01/17/2020, hypertension, morbid obesity, obstructive sleep apnea on CPAP, tobacco use disorder, history of acute deep vein thrombosis and pulmonary embolism on 04/08/2015 following gastric sleeve resection 6 weeks prior, and again presented with spontaneous right leg DVT on 03/07/2019 presently on Xarelto, underwent cardiac catheterization for abnormal stress test and new onset atrial fibrillation on 04/19/2020 revealing multivessel disease, patient went back into A. fib with RVR during cardiac catheterization.  Cardioversion was attempted the same day but failed to cardiovert.  He was discharged home on amiodarone with plans for outpatient evaluation and also referred for cardiothoracic surgical opinion in view of multivessel disease.  Patient extremely upset regarding need for CABG, I had advised him to bring his mother with him for discussions and also to review the cine angiograms.  Since patient has gone into A. fib, he has noticed marked worsening dyspnea, fatigue and also palpitations.  He has not had any chest pain, no PND or orthopnea.  He has been compliant in using his CPAP.  States that he has quit smoking cigarettes since hospital discharge.  Past Medical History:  Diagnosis Date  . Hx of deep vein thrombophlebitis of lower extremity   . Hx of gastric bypass   . Hx of pulmonary embolus   . Hypertension   . Obesity   . Sleep apnea   . Tobacco abuse    Past Surgical History:  Procedure Laterality Date  . CARDIOVERSION    . CARDIOVERSION N/A 04/19/2020   Procedure: CARDIOVERSION;  Surgeon: Yates Decamp, MD;   Location: [facility] ENDOSCOPY;  Service: Cardiovascular;  Laterality: N/A;  . HIP SURGERY    . LAPAROSCOPIC GASTRIC SLEEVE RESECTION N/A 02/14/2015   Procedure: LAPAROSCOPIC GASTRIC SLEEVE RESECTION WITH UPPER ENDOSCOPY;  Surgeon: Luretha Murphy, MD;  Location: [facility] ORS;  Service: General;  Laterality: N/A;  . LEFT HEART CATH AND CORONARY ANGIOGRAPHY N/A 04/19/2020   Procedure: LEFT HEART CATH AND CORONARY ANGIOGRAPHY;  Surgeon: Yates Decamp, MD;  Location: [facility] INVASIVE CV LAB;  Service: Cardiovascular;  Laterality: N/A;  . PELVIS CLOSED REDUCTION     Family History  Problem Relation Age of Onset  . Atrial fibrillation Mother   . CAD Mother   . Heart disease Mother   . CAD Father   . Atrial fibrillation Father     Social History   Tobacco Use  . Smoking status: Former Smoker    Packs/day: 0.50    Years: 28.00    Pack years: 14.00    Types: Cigarettes    Quit date: 04/20/2020  . Smokeless tobacco: Never Used  . Tobacco comment: half a pack daily  Substance Use Topics  . Alcohol use: Yes    Comment: occ   Marital Status: Single  ROS  Review of Systems  Constitutional: Positive for malaise/fatigue.  Cardiovascular: Positive for dyspnea on exertion and leg swelling (chronic). Negative for chest pain.  Gastrointestinal: Negative for melena.  Psychiatric/Behavioral: Positive for depression. The patient is nervous/anxious.    Objective  Blood pressure 133/81, pulse (!) 142, resp. rate 16, height 6\' 2"  (1.88  m), weight (!) 404 lb (183.3 kg), SpO2 98 %.  Vitals with BMI 04/21/2020 04/19/2020 04/19/2020  Height 6' 2" - -  Weight 404 lbs - -  BMI 51.85 - -  Systolic 133 145 144  Diastolic 81 111 104  Pulse 142 136 136     Physical Exam Constitutional:      Comments: He is well-built and morbidly obese in no acute distress.  Cardiovascular:     Rate and Rhythm: Tachycardia present. Rhythm irregularly irregular.     Pulses: Normal pulses and intact distal pulses.     Heart sounds: No murmur heard.    No gallop. No S3 or S4 sounds.      Comments: S1 is variable, S2 is normal. No JVD. Chronic venous stasis pigmentation of bilateral lower extremity.  Bilateral 1-2+ leg edema present, pitting. Pulmonary:     Effort: Pulmonary effort is normal.     Breath sounds: Normal breath sounds.  Abdominal:     General: Bowel sounds are normal.     Palpations: Abdomen is soft.    Laboratory examination:   Recent Labs    01/17/20 0756 04/08/20 0936  NA 142 140  K 4.0 4.3  CL 107 102  CO2 22 26  GLUCOSE 110* 83  BUN 10 10  CREATININE 0.78 0.80  CALCIUM 9.7 10.2  GFRNONAA >60 107  GFRAA >60 124   estimated creatinine clearance is 200.1 mL/min (by C-G formula based on SCr of 0.8 mg/dL).  CMP Latest Ref Rng & Units 04/08/2020 01/17/2020 03/07/2019  Glucose 65 - 99 mg/dL 83 110(H) 113(H)  BUN 6 - 24 mg/dL 10 10 10  Creatinine 0.76 - 1.27 mg/dL 0.80 0.78 0.83  Sodium 134 - 144 mmol/L 140 142 142  Potassium 3.5 - 5.2 mmol/L 4.3 4.0 4.1  Chloride 96 - 106 mmol/L 102 107 108  CO2 20 - 29 mmol/L 26 22 24  Calcium 8.7 - 10.2 mg/dL 10.2 9.7 10.3  Total Protein 6.5 - 8.1 g/dL - 6.6 -  Total Bilirubin 0.3 - 1.2 mg/dL - 0.9 -  Alkaline Phos 38 - 126 U/L - 71 -  AST 15 - 41 U/L - 22 -  ALT 0 - 44 U/L - 23 -   CBC Latest Ref Rng & Units 04/08/2020 01/17/2020 03/07/2019  WBC 3.4 - 10.8 x10E3/uL 9.3 9.9 11.4(H)  Hemoglobin 13.0 - 17.7 g/dL 15.4 15.9 14.9  Hematocrit 37.5 - 51.0 % 46.3 50.6 45.2  Platelets 150 - 450 x10E3/uL 293 243 284   Lipid Panel No results for input(s): CHOL, TRIG, LDLCALC, VLDL, HDL, CHOLHDL, LDLDIRECT in the last 8760 hours.  HEMOGLOBIN A1C No results found for: HGBA1C, MPG TSH Recent Labs    01/17/20 0813  TSH 1.564   External labs:   Cholesterol, total 230.000 11/03/2019 HDL 42.000 11/03/2019 LDL-C 158.000 11/03/2019 Triglycerides 164.000 11/03/2019  A1C 5.500 11/03/2019 TSH 1.564 01/17/2020  Medications and allergies   Allergies  Allergen Reactions  .  Amoxicillin Nausea And Vomiting     Current Outpatient Medications  Medication Instructions  . amiodarone (PACERONE) 200 mg, Oral, 2 times daily  . aspirin EC 81 mg, Oral, Daily  . atorvastatin (LIPITOR) 80 mg, Oral, Daily  . benazepril (LOTENSIN) 20 mg, Oral, 2 times daily  . diltiazem (CARDIZEM CD) 240 mg, Oral, Daily  . metoprolol succinate (TOPROL-XL) 50 mg, Oral, Daily, Take with or immediately following a meal.  . nitroGLYCERIN (NITROSTAT) 0.4 mg, Sublingual, Every 5 min PRN, If you   require more than two tablets five minutes apart go to the nearest ER via EMS.  . RABEprazole (ACIPHEX) 20 mg, Oral, Daily  . Xarelto 20 mg, Oral, Daily    Radiology:   PORTABLE CHEST 1 VIEW 01/17/2020 COMPARISON:  04/08/2015 Cardiac silhouette is normal in size. No mediastinal or hilar masses. No evidence of adenopathy. Lungs are clear.  No pleural effusion or pneumothorax. Skeletal structures are grossly intact.  Cardiac Studies:   Echocardiogram: 02/29/2020: LVEF 55-60%, mildly dilated left ventricular cavity, mild LVH, normal diastolic filling pattern, normal left atrial pressure, mildly dilated left atrium, mild MR, trace TR.  Stress Testing: Lexiscan Sestamibi stress test 02/29/2020: Stress EKG at 78% MPHR shows sinus tachycardia, nonspecific T wave inversion in inferior leads, frequent PVC's.  SPECT images show large sized, medium intensity, predominantly reversible perfusion defect in basal to apical, inferoseptal, inferior, and inferolateral myocadium. While study quality may be limited due to attenuation (BMI 52), ischemia cannot be excluded. Stress LVEF 65%. Recommend clinical correlation. High risk study.   Vascular imaging: Ultrasound venous duplex: 03/07/2019: Right: Findings consistent with acute deep vein thrombosis involving the right popliteal vein. There is evidence of superficial vein thrombosis involving a varicose vein of the proximal, medial, right calf.  Left: No evidence  of common femoral vein obstruction.  Left Heart Catheterization 04/19/20:  LV: Moderately elevated LVEDP.  LM: Mid 40%. Hazy. Severe diffuse multivessel disease. Ectasia of the LAD, RCA. Prox LAD aneurysmal. Occluded after origin of a very large D1. D1 is very large with mid 90% stenosis. Ipsilateral and contralateral (RCA) to mid LAD noted.  Cx: Prox 70-80% stenosis involves the bifurcation of large OM-1. At the bifurcation there is at least a 80% stenosis. RCA: Diffusely ectatic. PL branch has a focal 50% stenosis.  Slow flow noted in the RCA.   Rec: Complex multivessel CAD and will benefit from CABG to LAD and D1 and OM1 with arterial conduits. Maze and LAA ligation. Patient went into atrial fibrillation in the cath lab. 70 mL contrast used. IV dilt and metoprolol given but did not convert to sinus. He will resume Xarelto tonight. Patient wants to come in and discuss personally at the office. Not happy about CABG potential.  EKG  EKG 03/31/2020: Normal sinus rhythm at rate of 81 bpm, normal axis, cannot exclude anteroseptal infarct old.  Assessment     ICD-10-CM   1. Coronary artery disease involving native coronary artery of native heart with other form of angina pectoris (HCC)  I25.118 atorvastatin (LIPITOR) 80 MG tablet    metoprolol succinate (TOPROL-XL) 50 MG 24 hr tablet    aspirin EC 81 MG tablet    Ambulatory referral to Cardiothoracic Surgery  2. Paroxysmal atrial fibrillation (HCC). CHA2DS2-VASc Score is 2.  Yearly risk of stroke: 2.3% (CAD, HTN).   I48.0   3. Primary hypertension  I10   4. Hypercholesteremia  E78.00 atorvastatin (LIPITOR) 80 MG tablet  5. Class 3 severe obesity due to excess calories with serious comorbidity and body mass index (BMI) of 50.0 to 59.9 in adult (HCC)  E66.01    Z68.43     CHA2DS2-VASc Score is 2.  Yearly risk of stroke: 2.3% (CAD, HTN).  Score of 1=1.3; 2=2.2; 3=3.2; 4=4; 5=6.7; 6=9.8; 7=>9.8) -(CHF; HTN; vasc disease DM,  Male = 1; Age <65  =0; 65-74 = 1,  >75 =2; stroke = 2).    Medications Discontinued During This Encounter  Medication Reason  . aspirin EC 81 MG tablet Reorder  Recommendations:   Visit date not found  Joseph Lang  is a 44 y.o. White or Caucasian [1] male patient with past medical history of paroxysmal atrial fibrillation and cardioversion on 01/17/2020, hypertension, morbid obesity, obstructive sleep apnea on CPAP, tobacco use disorder, history of acute deep vein thrombosis and pulmonary embolism on 04/08/2015 following gastric sleeve resection 6 weeks prior, and again presented with spontaneous right leg DVT on 03/07/2019 presently on Xarelto, underwent cardiac catheterization for abnormal stress test and new onset atrial fibrillation on 04/19/2020 revealing multivessel disease, patient went back into A. fib with RVR during cardiac catheterization.  Cardioversion was attempted the same day but failed to cardiovert.  He was discharged home on amiodarone with plans for outpatient evaluation and also referred for cardiothoracic surgical opinion in view of multivessel disease.  Patient's mother is present at the bedside.  I reviewed the coronary angiograms with the patient and his mother and explained to them regarding multiple vessel disease, although he is only 44 years of age is behaving in a very aggressive manner with regard to CAD, he does have family history of premature coronary artery disease in both mother and father and they both have had bypass surgery in the past.  We discussed regarding options of PCI if after discussions with surgeon, Dr. Ofilia Neas if patient decides against surgery.  Risk factor modification discussed at great length including weight loss.  Patient has had gastric sleeve surgery in 2016 and unfortunately has gained the weight back.  He is now willing to go back to making lifestyle changes.  States that he has already quit smoking since cardiac catheterization.  In the interim I discussed  with patient that his coronary artery disease is chronic and that if he is willing to lose weight, medical therapy for multivessel disease, trials of multivessel disease including recent ARTS discussed with the patient.  He would like to wait for 3 to 4 weeks prior to surgery which is okay with me. Restart ASA 81 mg daily.   He has extremely symptomatic A. fib with RVR, I will add metoprolol succinate 50 mg daily.  I will set him up for direct-current cardioversion.  He will need to defibrillators to deliver higher dose in view of his morbid obesity.  In view of prior history of DVT and PE, and recurrent unprovoked DVT involving lower extremity, he will need to be on long-term anticoagulation.  I reviewed his external labs, in view of underlying CAD and hyperlipidemia, will start him on Lipitor 80 mg daily.  I would like to see him back in 2 to 3 weeks for follow-up.  Cardioversion set in 3 days due to scheduling issues.  This was a 60-minute encounter.   Yates Decamp, MD, [facility] 04/23/2020, 5:29 PM Office: [phone]

## 2020-04-23 NOTE — H&P (View-Only) (Signed)
Primary Physician/Referring:  Georgann Housekeeper, MD  Patient ID: Joseph Lang, male    DOB: 1975/12/18, 44 y.o.   MRN: [id number]  Chief Complaint  Patient presents with  . Follow-up  . Heart Catheterization   HPI:   Visit date not found  Joseph Lang  is a 44 y.o.  White or Caucasian [1] male patient with past medical history of paroxysmal atrial fibrillation and cardioversion on 01/17/2020, hypertension, morbid obesity, obstructive sleep apnea on CPAP, tobacco use disorder, history of acute deep vein thrombosis and pulmonary embolism on 04/08/2015 following gastric sleeve resection 6 weeks prior, and again presented with spontaneous right leg DVT on 03/07/2019 presently on Xarelto, underwent cardiac catheterization for abnormal stress test and new onset atrial fibrillation on 04/19/2020 revealing multivessel disease, patient went back into A. fib with RVR during cardiac catheterization.  Cardioversion was attempted the same day but failed to cardiovert.  He was discharged home on amiodarone with plans for outpatient evaluation and also referred for cardiothoracic surgical opinion in view of multivessel disease.  Patient extremely upset regarding need for CABG, I had advised him to bring his mother with him for discussions and also to review the cine angiograms.  Since patient has gone into A. fib, he has noticed marked worsening dyspnea, fatigue and also palpitations.  He has not had any chest pain, no PND or orthopnea.  He has been compliant in using his CPAP.  States that he has quit smoking cigarettes since hospital discharge.  Past Medical History:  Diagnosis Date  . Hx of deep vein thrombophlebitis of lower extremity   . Hx of gastric bypass   . Hx of pulmonary embolus   . Hypertension   . Obesity   . Sleep apnea   . Tobacco abuse    Past Surgical History:  Procedure Laterality Date  . CARDIOVERSION    . CARDIOVERSION N/A 04/19/2020   Procedure: CARDIOVERSION;  Surgeon: Yates Decamp, MD;   Location: [facility] ENDOSCOPY;  Service: Cardiovascular;  Laterality: N/A;  . HIP SURGERY    . LAPAROSCOPIC GASTRIC SLEEVE RESECTION N/A 02/14/2015   Procedure: LAPAROSCOPIC GASTRIC SLEEVE RESECTION WITH UPPER ENDOSCOPY;  Surgeon: Luretha Murphy, MD;  Location: [facility] ORS;  Service: General;  Laterality: N/A;  . LEFT HEART CATH AND CORONARY ANGIOGRAPHY N/A 04/19/2020   Procedure: LEFT HEART CATH AND CORONARY ANGIOGRAPHY;  Surgeon: Yates Decamp, MD;  Location: [facility] INVASIVE CV LAB;  Service: Cardiovascular;  Laterality: N/A;  . PELVIS CLOSED REDUCTION     Family History  Problem Relation Age of Onset  . Atrial fibrillation Mother   . CAD Mother   . Heart disease Mother   . CAD Father   . Atrial fibrillation Father     Social History   Tobacco Use  . Smoking status: Former Smoker    Packs/day: 0.50    Years: 28.00    Pack years: 14.00    Types: Cigarettes    Quit date: 04/20/2020  . Smokeless tobacco: Never Used  . Tobacco comment: half a pack daily  Substance Use Topics  . Alcohol use: Yes    Comment: occ   Marital Status: Single  ROS  Review of Systems  Constitutional: Positive for malaise/fatigue.  Cardiovascular: Positive for dyspnea on exertion and leg swelling (chronic). Negative for chest pain.  Gastrointestinal: Negative for melena.  Psychiatric/Behavioral: Positive for depression. The patient is nervous/anxious.    Objective  Blood pressure 133/81, pulse (!) 142, resp. rate 16, height 6\' 2"  (1.88  m), weight (!) 404 lb (183.3 kg), SpO2 98 %.  Vitals with BMI 04/21/2020 04/19/2020 04/19/2020  Height 6\' 2"  - -  Weight 404 lbs - -  BMI 51.85 - -  Systolic 133 145 [id number]  Diastolic 81 111 104  Pulse 142 136 136     Physical Exam Constitutional:      Comments: He is well-built and morbidly obese in no acute distress.  Cardiovascular:     Rate and Rhythm: Tachycardia present. Rhythm irregularly irregular.     Pulses: Normal pulses and intact distal pulses.     Heart sounds: No murmur heard.    No gallop. No S3 or S4 sounds.      Comments: S1 is variable, S2 is normal. No JVD. Chronic venous stasis pigmentation of bilateral lower extremity.  Bilateral 1-2+ leg edema present, pitting. Pulmonary:     Effort: Pulmonary effort is normal.     Breath sounds: Normal breath sounds.  Abdominal:     General: Bowel sounds are normal.     Palpations: Abdomen is soft.    Laboratory examination:   Recent Labs    01/17/20 0756 04/08/20 0936  NA 142 140  K 4.0 4.3  CL 107 102  CO2 22 26  GLUCOSE 110* 83  BUN 10 10  CREATININE 0.78 0.80  CALCIUM 9.7 10.2  GFRNONAA >60 107  GFRAA >60 124   estimated creatinine clearance is 200.1 mL/min (by C-G formula based on SCr of 0.8 mg/dL).  CMP Latest Ref Rng & Units 04/08/2020 01/17/2020 03/07/2019  Glucose 65 - 99 mg/dL 83 096(E110(H) 454(U113(H)  BUN 6 - 24 mg/dL 10 10 10   Creatinine 0.76 - 1.27 mg/dL 9.810.80 1.910.78 4.780.83  Sodium 134 - 144 mmol/L 140 142 142  Potassium 3.5 - 5.2 mmol/L 4.3 4.0 4.1  Chloride 96 - 106 mmol/L 102 107 108  CO2 20 - 29 mmol/L 26 22 24   Calcium 8.7 - 10.2 mg/dL 29.510.2 9.7 62.110.3  Total Protein 6.5 - 8.1 g/dL - 6.6 -  Total Bilirubin 0.3 - 1.2 mg/dL - 0.9 -  Alkaline Phos 38 - 126 U/L - 71 -  AST 15 - 41 U/L - 22 -  ALT 0 - 44 U/L - 23 -   CBC Latest Ref Rng & Units 04/08/2020 01/17/2020 03/07/2019  WBC 3.4 - 10.8 x10E3/uL 9.3 9.9 11.4(H)  Hemoglobin 13.0 - 17.7 g/dL 30.815.4 65.715.9 84.614.9  Hematocrit 37.5 - 51.0 % 46.3 50.6 45.2  Platelets 150 - 450 x10E3/uL 293 243 284   Lipid Panel No results for input(s): CHOL, TRIG, LDLCALC, VLDL, HDL, CHOLHDL, LDLDIRECT in the last 8760 hours.  HEMOGLOBIN A1C No results found for: HGBA1C, MPG TSH Recent Labs    01/17/20 0813  TSH 1.564   External labs:   Cholesterol, total 230.000 11/03/2019 HDL 42.000 11/03/2019 LDL-C 158.000 11/03/2019 Triglycerides 164.000 11/03/2019  A1C 5.500 11/03/2019 TSH 1.564 01/17/2020  Medications and allergies   Allergies  Allergen Reactions  .  Amoxicillin Nausea And Vomiting     Current Outpatient Medications  Medication Instructions  . amiodarone (PACERONE) 200 mg, Oral, 2 times daily  . aspirin EC 81 mg, Oral, Daily  . atorvastatin (LIPITOR) 80 mg, Oral, Daily  . benazepril (LOTENSIN) 20 mg, Oral, 2 times daily  . diltiazem (CARDIZEM CD) 240 mg, Oral, Daily  . metoprolol succinate (TOPROL-XL) 50 mg, Oral, Daily, Take with or immediately following a meal.  . nitroGLYCERIN (NITROSTAT) 0.4 mg, Sublingual, Every 5 min PRN, If you  require more than two tablets five minutes apart go to the nearest ER via EMS.  . RABEprazole (ACIPHEX) 20 mg, Oral, Daily  . Xarelto 20 mg, Oral, Daily    Radiology:   PORTABLE CHEST 1 VIEW 01/17/2020 COMPARISON:  04/08/2015 Cardiac silhouette is normal in size. No mediastinal or hilar masses. No evidence of adenopathy. Lungs are clear.  No pleural effusion or pneumothorax. Skeletal structures are grossly intact.  Cardiac Studies:   Echocardiogram: 02/29/2020: LVEF 55-60%, mildly dilated left ventricular cavity, mild LVH, normal diastolic filling pattern, normal left atrial pressure, mildly dilated left atrium, mild MR, trace TR.  Stress Testing: Lexiscan Sestamibi stress test 02/29/2020: Stress EKG at 78% MPHR shows sinus tachycardia, nonspecific T wave inversion in inferior leads, frequent PVC's.  SPECT images show large sized, medium intensity, predominantly reversible perfusion defect in basal to apical, inferoseptal, inferior, and inferolateral myocadium. While study quality may be limited due to attenuation (BMI 52), ischemia cannot be excluded. Stress LVEF 65%. Recommend clinical correlation. High risk study.   Vascular imaging: Ultrasound venous duplex: 03/07/2019: Right: Findings consistent with acute deep vein thrombosis involving the right popliteal vein. There is evidence of superficial vein thrombosis involving a varicose vein of the proximal, medial, right calf.  Left: No evidence  of common femoral vein obstruction.  Left Heart Catheterization 04/19/20:  LV: Moderately elevated LVEDP.  LM: Mid 40%. Hazy. Severe diffuse multivessel disease. Ectasia of the LAD, RCA. Prox LAD aneurysmal. Occluded after origin of a very large D1. D1 is very large with mid 90% stenosis. Ipsilateral and contralateral (RCA) to mid LAD noted.  Cx: Prox 70-80% stenosis involves the bifurcation of large OM-1. At the bifurcation there is at least a 80% stenosis. RCA: Diffusely ectatic. PL branch has a focal 50% stenosis.  Slow flow noted in the RCA.   Rec: Complex multivessel CAD and will benefit from CABG to LAD and D1 and OM1 with arterial conduits. Maze and LAA ligation. Patient went into atrial fibrillation in the cath lab. 70 mL contrast used. IV dilt and metoprolol given but did not convert to sinus. He will resume Xarelto tonight. Patient wants to come in and discuss personally at the office. Not happy about CABG potential.  EKG  EKG 03/31/2020: Normal sinus rhythm at rate of 81 bpm, normal axis, cannot exclude anteroseptal infarct old.  Assessment     ICD-10-CM   1. Coronary artery disease involving native coronary artery of native heart with other form of angina pectoris (HCC)  I25.118 atorvastatin (LIPITOR) 80 MG tablet    metoprolol succinate (TOPROL-XL) 50 MG 24 hr tablet    aspirin EC 81 MG tablet    Ambulatory referral to Cardiothoracic Surgery  2. Paroxysmal atrial fibrillation (HCC). CHA2DS2-VASc Score is 2.  Yearly risk of stroke: 2.3% (CAD, HTN).   I48.0   3. Primary hypertension  I10   4. Hypercholesteremia  E78.00 atorvastatin (LIPITOR) 80 MG tablet  5. Class 3 severe obesity due to excess calories with serious comorbidity and body mass index (BMI) of 50.0 to 59.9 in adult (HCC)  E66.01    Z68.43     CHA2DS2-VASc Score is 2.  Yearly risk of stroke: 2.3% (CAD, HTN).  Score of 1=1.3; 2=2.2; 3=3.2; 4=4; 5=6.7; 6=9.8; 7=>9.8) -(CHF; HTN; vasc disease DM,  Male = 1; Age <65  =0; 65-74 = 1,  >75 =2; stroke = 2).    Medications Discontinued During This Encounter  Medication Reason  . aspirin EC 81 MG tablet Reorder  Recommendations:   Visit date not found  Joseph Lang  is a 44 y.o. White or Caucasian [1] male patient with past medical history of paroxysmal atrial fibrillation and cardioversion on 01/17/2020, hypertension, morbid obesity, obstructive sleep apnea on CPAP, tobacco use disorder, history of acute deep vein thrombosis and pulmonary embolism on 04/08/2015 following gastric sleeve resection 6 weeks prior, and again presented with spontaneous right leg DVT on 03/07/2019 presently on Xarelto, underwent cardiac catheterization for abnormal stress test and new onset atrial fibrillation on 04/19/2020 revealing multivessel disease, patient went back into A. fib with RVR during cardiac catheterization.  Cardioversion was attempted the same day but failed to cardiovert.  He was discharged home on amiodarone with plans for outpatient evaluation and also referred for cardiothoracic surgical opinion in view of multivessel disease.  Patient's mother is present at the bedside.  I reviewed the coronary angiograms with the patient and his mother and explained to them regarding multiple vessel disease, although he is only 44 years of age is behaving in a very aggressive manner with regard to CAD, he does have family history of premature coronary artery disease in both mother and father and they both have had bypass surgery in the past.  We discussed regarding options of PCI if after discussions with surgeon, Dr. Ofilia Neas if patient decides against surgery.  Risk factor modification discussed at great length including weight loss.  Patient has had gastric sleeve surgery in 2016 and unfortunately has gained the weight back.  He is now willing to go back to making lifestyle changes.  States that he has already quit smoking since cardiac catheterization.  In the interim I discussed  with patient that his coronary artery disease is chronic and that if he is willing to lose weight, medical therapy for multivessel disease, trials of multivessel disease including recent ARTS discussed with the patient.  He would like to wait for 3 to 4 weeks prior to surgery which is okay with me. Restart ASA 81 mg daily.   He has extremely symptomatic A. fib with RVR, I will add metoprolol succinate 50 mg daily.  I will set him up for direct-current cardioversion.  He will need to defibrillators to deliver higher dose in view of his morbid obesity.  In view of prior history of DVT and PE, and recurrent unprovoked DVT involving lower extremity, he will need to be on long-term anticoagulation.  I reviewed his external labs, in view of underlying CAD and hyperlipidemia, will start him on Lipitor 80 mg daily.  I would like to see him back in 2 to 3 weeks for follow-up.  Cardioversion set in 3 days due to scheduling issues.  This was a 60-minute encounter.   Yates Decamp, MD, [facility] 04/23/2020, 5:29 PM Office: [phone]

## 2020-04-25 NOTE — CV Procedure (Signed)
Direct current cardioversion:  Indication symptomatic A. Fibrillation.  Procedure: Using 140 mg of IV Propofol and 80 IV Lidocaine (for reducing venous pain) for achieving deep sedation, synchronized direct current cardioversion performed. Patient was delivered with 150x1, then 200 Joules of electricity X 4 without success.  Patient tolerated the procedure well. No immediate complication noted.    Patient will be started on Amiodarone and will re-attempt cardioversion again. Continue anticoagulation.   Yates Decamp, MD, [facility] 04/19/2020, Office: [phone]

## 2020-04-26 ENCOUNTER — Ambulatory Visit (HOSPITAL_COMMUNITY): Payer: BC Managed Care – PPO | Admitting: Certified Registered"

## 2020-04-26 ENCOUNTER — Other Ambulatory Visit: Payer: Self-pay

## 2020-04-26 ENCOUNTER — Encounter (HOSPITAL_COMMUNITY): Payer: Self-pay | Admitting: Cardiology

## 2020-04-26 ENCOUNTER — Ambulatory Visit (HOSPITAL_COMMUNITY)
Admission: RE | Admit: 2020-04-26 | Discharge: 2020-04-26 | Disposition: A | Discharge status: Home / Self Care | Payer: BC Managed Care – PPO | Attending: Cardiology | Admitting: Cardiology

## 2020-04-26 ENCOUNTER — Encounter (HOSPITAL_COMMUNITY)
Admission: RE | Disposition: A | Discharge status: Home / Self Care | Payer: Self-pay | Source: Home / Self Care | Attending: Cardiology

## 2020-04-26 DIAGNOSIS — E7800 Pure hypercholesterolemia, unspecified: Secondary | ICD-10-CM | POA: Insufficient documentation

## 2020-04-26 DIAGNOSIS — Z87891 Personal history of nicotine dependence: Secondary | ICD-10-CM | POA: Insufficient documentation

## 2020-04-26 DIAGNOSIS — Z9884 Bariatric surgery status: Secondary | ICD-10-CM | POA: Insufficient documentation

## 2020-04-26 DIAGNOSIS — I10 Essential (primary) hypertension: Secondary | ICD-10-CM | POA: Insufficient documentation

## 2020-04-26 DIAGNOSIS — I480 Paroxysmal atrial fibrillation: Secondary | ICD-10-CM | POA: Insufficient documentation

## 2020-04-26 DIAGNOSIS — Z7982 Long term (current) use of aspirin: Secondary | ICD-10-CM | POA: Insufficient documentation

## 2020-04-26 DIAGNOSIS — Z86711 Personal history of pulmonary embolism: Secondary | ICD-10-CM | POA: Insufficient documentation

## 2020-04-26 DIAGNOSIS — Z880 Allergy status to penicillin: Secondary | ICD-10-CM | POA: Diagnosis not present

## 2020-04-26 DIAGNOSIS — Z79899 Other long term (current) drug therapy: Secondary | ICD-10-CM | POA: Diagnosis not present

## 2020-04-26 DIAGNOSIS — G4730 Sleep apnea, unspecified: Secondary | ICD-10-CM | POA: Insufficient documentation

## 2020-04-26 DIAGNOSIS — Z7901 Long term (current) use of anticoagulants: Secondary | ICD-10-CM | POA: Insufficient documentation

## 2020-04-26 DIAGNOSIS — Z8249 Family history of ischemic heart disease and other diseases of the circulatory system: Secondary | ICD-10-CM | POA: Diagnosis not present

## 2020-04-26 DIAGNOSIS — Z6841 Body Mass Index (BMI) 40.0 and over, adult: Secondary | ICD-10-CM | POA: Diagnosis not present

## 2020-04-26 HISTORY — PX: CARDIOVERSION: SHX1299

## 2020-04-26 SURGERY — CARDIOVERSION
Anesthesia: General

## 2020-04-26 MED ORDER — PROPOFOL 10 MG/ML IV BOLUS
INTRAVENOUS | Status: DC | PRN
  Administered 2020-04-26: 140 mg via INTRAVENOUS

## 2020-04-26 MED ORDER — SODIUM CHLORIDE 0.9 % IV SOLN: INTRAVENOUS | Status: DC

## 2020-04-26 MED ORDER — LIDOCAINE HCL (CARDIAC) PF 100 MG/5ML IV SOSY
PREFILLED_SYRINGE | INTRAVENOUS | Status: DC | PRN
  Administered 2020-04-26: 100 mg via INTRAVENOUS

## 2020-04-26 NOTE — Interval H&P Note (Signed)
History and Physical Interval Note:  04/26/2020 1:53 PM  Joseph Lang  has presented today for surgery, with the diagnosis of afib.  The various methods of treatment have been discussed with the patient and family. After consideration of risks, benefits and other options for treatment, the patient has consented to  Procedure(s): CARDIOVERSION (N/A) as a surgical intervention.  The patient's history has been reviewed, patient examined, no change in status, stable for surgery.  I have reviewed the patient's chart and labs.  Questions were answered to the patient's satisfaction.     Yates Decamp

## 2020-04-26 NOTE — Discharge Instructions (Signed)
Electrical Cardioversion  Follow these instructions at home:  Do not drive for 24 hours if you were given a sedative during your procedure.  Take over-the-counter and prescription medicines only as told by your health care provider.  Ask your health care provider how to check your pulse. Check it often.  Rest for 48 hours after the procedure or as told by your health care provider.  Avoid or limit your caffeine use as told by your health care provider.  Keep all follow-up visits as told by your health care provider. This is important. Contact a health care provider if:  You feel like your heart is beating too quickly or your pulse is not regular.  You have a serious muscle cramp that does not go away. Get help right away if:  You have discomfort in your chest.  You are dizzy or you feel faint.  You have trouble breathing or you are short of breath.  Your speech is slurred.  You have trouble moving an arm or leg on one side of your body.  Your fingers or toes turn cold or blue. Summary  Electrical cardioversion is the delivery of a jolt of electricity to restore a normal rhythm to the heart.  This procedure may be done right away in an emergency or may be a scheduled procedure if the condition is not an emergency.  Generally, this is a safe procedure.  After the procedure, check your pulse often as told by your health care provider. This information is not intended to replace advice given to you by your health care provider. Make sure you discuss any questions you have with your health care provider. Document Revised: 05/04/2019 Document Reviewed: 05/04/2019 Elsevier Patient Education  2020 [employer]  

## 2020-04-26 NOTE — Anesthesia Preprocedure Evaluation (Signed)
Anesthesia Evaluation  Patient identified by MRN, date of birth, ID band Patient awake    Reviewed: Allergy & Precautions, NPO status , Patient's Chart, lab work & pertinent test results  Airway Mallampati: I  TM Distance: >3 FB Neck ROM: Full    Dental   Pulmonary sleep apnea , Patient abstained from smoking., former smoker,    Pulmonary exam normal        Cardiovascular hypertension, Pt. on medications Normal cardiovascular exam     Neuro/Psych    GI/Hepatic   Endo/Other    Renal/GU      Musculoskeletal   Abdominal   Peds  Hematology   Anesthesia Other Findings   Reproductive/Obstetrics                             Anesthesia Physical Anesthesia Plan  ASA: III  Anesthesia Plan: General   Post-op Pain Management:    Induction: Intravenous  PONV Risk Score and Plan: 2 and Treatment may vary due to age or medical condition  Airway Management Planned: Mask  Additional Equipment:   Intra-op Plan:   Post-operative Plan:   Informed Consent: I have reviewed the patients History and Physical, chart, labs and discussed the procedure including the risks, benefits and alternatives for the proposed anesthesia with the patient or authorized representative who has indicated his/her understanding and acceptance.       Plan Discussed with: CRNA and Surgeon  Anesthesia Plan Comments:         Anesthesia Quick Evaluation

## 2020-04-26 NOTE — Anesthesia Postprocedure Evaluation (Signed)
Anesthesia Post Note  Patient: Joseph Lang  Procedure(s) Performed: CARDIOVERSION (N/A )     Patient location during evaluation: PACU Anesthesia Type: General Level of consciousness: awake and alert Pain management: pain level controlled Vital Signs Assessment: post-procedure vital signs reviewed and stable Respiratory status: spontaneous breathing, nonlabored ventilation, respiratory function stable and patient connected to nasal cannula oxygen Cardiovascular status: blood pressure returned to baseline and stable Postop Assessment: no apparent nausea or vomiting Anesthetic complications: no   No complications documented.  Last Vitals:  Vitals:   04/26/20 1420 04/26/20 1430  BP: 104/71 116/65  Pulse: 65 64  Resp:  16  Temp:    SpO2: 100%     Last Pain:  Vitals:   04/26/20 1430  TempSrc:   PainSc: 0-No pain                 Otilia Kareem DAVID

## 2020-04-26 NOTE — CV Procedure (Signed)
Direct current cardioversion:  Indication symptomatic A. Fibrillation/flutter.  Procedure: Using 130 mg of IV Propofol and 100 IV Lidocaine (for reducing venous pain) for achieving deep sedation, synchronized direct current cardioversion performed. Patient was delivered with 200 Joules of electricity X 1 with success from A. Flutter to NSR. Patient tolerated the procedure well. No immediate complication noted.     Yates Decamp, MD, [facility] 04/26/2020, 2:04 PM Office: [phone]

## 2020-04-26 NOTE — Transfer of Care (Signed)
Immediate Anesthesia Transfer of Care Note  Patient: Joseph Lang  Procedure(s) Performed: CARDIOVERSION (N/A )  Patient Location: PACU  Anesthesia Type:MAC  Level of Consciousness: awake, alert  and oriented  Airway & Oxygen Therapy: Patient Spontanous Breathing and Patient connected to nasal cannula oxygen  Post-op Assessment: Report given to RN and Post -op Vital signs reviewed and stable  Post vital signs: Reviewed and stable  Last Vitals:  Vitals Value Taken Time  BP    Temp    Pulse    Resp    SpO2      Last Pain:  Vitals:   04/26/20 1237  TempSrc: Oral  PainSc: 0-No pain         Complications: No complications documented.

## 2020-04-27 ENCOUNTER — Ambulatory Visit: Payer: BC Managed Care – PPO | Admitting: Cardiology

## 2020-04-28 ENCOUNTER — Encounter (HOSPITAL_COMMUNITY): Payer: Self-pay | Admitting: Cardiology

## 2020-04-28 NOTE — Progress Notes (Signed)
[address]       [place] [address]             [phone]                    Joseph E Ezekiel [facility] Medical Record #[id number] Date of Birth: Mar 23, 1976  Referring: Yates Decamp, MD Primary Care: Georgann Housekeeper, MD Primary Cardiologist: No primary care provider on file.  Chief Complaint:    Chief Complaint  Patient presents with  . Coronary Artery Disease    Surgical eval, Cardiac Cath 04/19/20, ECHO 02/29/20    History of Present Illness:    Joseph Lang 44 y.o. male is seen in the office  today for evaluation of possible coronary artery bypass grafting.  The patient underwent recent cardiac catheterization by Dr. Jacinto Halim and was referred following this.  The patient notes that he began having increasing symptoms, Easter 2021 when he developed atrial fibrillation, he was cardioverted in the emergency room and discharged home.  Troponins were noted to be mildly elevated high sensitive troponin at 153.  Follow-up stress test was performed by Dr. Jacinto Halim leading to cardiac catheterization.  At the time of catheterization the patient presented in sinus rhythm but during the cath procedure went back into atrial fib.  Last week he underwent cardioversion and has remained in sinus rhythm since.   Patient has a history of obstructive sleep apnea.  He had a sleeve gastrectomy for weight loss by Dr. Daphine Deutscher 5 years ago.  Several months following surgery developed multiple pulmonary emboli and was started on anticoagulation.  In 2019-02-09 he had DVT and is now on Xarelto for anticoagulation even prior to the onset of atrial fibrillation.  His family history is strong for both atrial fib and coronary artery disease, both his mother and father have had bypass surgery.  His father died in February 08, 2018 during carotid artery procedure. Patient smokes 1 pack of cigarettes since a day since age 5, he has a high intake of caffeine and soft drinks.  Patient has had a long-term problem with his  excessive weight, notes initially after gastric sleeve resection he reduced his weight weight down to just over 300 pounds.  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  Current Activity/ Functional Status:  Patient is independent with mobility/ambulation, transfers, ADL's, IADL's.   Zubrod Score: At the time of surgery this patient's most appropriate activity status/level should be described as: []     0    Normal activity, no symptoms [x]     1    Restricted in physical strenuous activity but ambulatory, able to do out light work []     2    Ambulatory and capable of self care, unable to do work activities, up and about               >50 % of waking hours                              []     3    Only limited self care, in bed greater than 50% of waking hours []     4    Completely disabled, no self care, confined to bed or chair []     5    Moribund   Past Medical History:  Diagnosis Date  .  Hx of deep vein thrombophlebitis of lower extremity   . Hx of gastric bypass   . Hx of pulmonary embolus   . Hypertension   . Obesity   . Sleep apnea   . Tobacco abuse     Past Surgical History:  Procedure Laterality Date  . CARDIOVERSION    . CARDIOVERSION N/A 04/19/2020   Procedure: CARDIOVERSION;  Surgeon: Yates Decamp, MD;  Location: [facility] ENDOSCOPY;  Service: Cardiovascular;  Laterality: N/A;  . CARDIOVERSION N/A 04/26/2020   Procedure: CARDIOVERSION;  Surgeon: Yates Decamp, MD;  Location: [facility] ENDOSCOPY;  Service: Cardiovascular;  Laterality: N/A;  . HIP SURGERY    . LAPAROSCOPIC GASTRIC SLEEVE RESECTION N/A 02/14/2015   Procedure: LAPAROSCOPIC GASTRIC SLEEVE RESECTION WITH UPPER ENDOSCOPY;  Surgeon: Luretha Murphy, MD;  Location: [facility] ORS;  Service: General;  Laterality: N/A;  . LEFT HEART CATH AND CORONARY ANGIOGRAPHY N/A 04/19/2020   Procedure: LEFT HEART CATH AND CORONARY ANGIOGRAPHY;  Surgeon: Yates Decamp,  MD;  Location: [facility] INVASIVE CV LAB;  Service: Cardiovascular;  Laterality: N/A;  . PELVIS CLOSED REDUCTION      Family History  Problem Relation Age of Onset  . Atrial fibrillation Mother   . CAD Mother   . Heart disease Mother   . CAD Father   . Atrial fibrillation Father      Social History   Tobacco Use  Smoking Status Former Smoker  . Packs/day: 0.50  . Years: 28.00  . Pack years: 14.00  . Types: Cigarettes  . Quit date: 04/20/2020  . Years since quitting: 0.0  Smokeless Tobacco Never Used  Tobacco Comment   half a pack daily    Social History   Substance and Sexual Activity  Alcohol Use Yes   Comment: occ     Allergies  Allergen Reactions  . Amoxicillin Nausea And Vomiting    Current Outpatient Medications  Medication Sig Dispense Refill  . amiodarone (PACERONE) 200 MG tablet Take 1 tablet (200 mg total) by mouth daily. 60 tablet 2  . aspirin EC 81 MG tablet Take 1 tablet (81 mg total) by mouth daily. 90 tablet 3  . atorvastatin (LIPITOR) 80 MG tablet Take 1 tablet (80 mg total) by mouth daily. 30 tablet 2  . benazepril (LOTENSIN) 20 MG tablet Take 20 mg by mouth 2 (two) times daily.     Marland Kitchen diltiazem (CARDIZEM CD) 240 MG 24 hr capsule Take 1 capsule (240 mg total) by mouth daily. 30 capsule 3  . metoprolol succinate (TOPROL-XL) 50 MG 24 hr tablet Take 1 tablet (50 mg total) by mouth daily. Take with or immediately following a meal. 30 tablet 2  . nitroGLYCERIN (NITROSTAT) 0.4 MG SL tablet Place 1 tablet (0.4 mg total) under the tongue every 5 (five) minutes as needed for chest pain. If you require more than two tablets five minutes apart go to the nearest ER via EMS. 30 tablet 0  . RABEprazole (ACIPHEX) 20 MG tablet Take 20 mg by mouth daily.     Carlena Hurl 20 MG TABS tablet Take 20 mg by mouth daily.     No current facility-administered medications for this visit.    Pertinent items are noted in HPI.   Review of Systems:     Cardiac Review of Systems: [Y]  = yes  or   [ N ] = no   Chest Pain [  x  ]  Resting SOB [ x  ] Exertional SOB  [ y ]  Orthopnea [  n ]   Pedal Edema [ n  ]    Palpitations [ y ] Syncope  [n  ]   Presyncope [n   ]   General Review of Systems: [Y] = yes [  ]=no Constitional: recent weight change [  ];  Wt loss over the last 3 months [n   ] anorexia [  ]; fatigue [  ]; nausea [  ]; night sweats [  ]; fever [  ]; or chills [  ];           Eye : blurred vision [  ]; diplopia [   ]; vision changes [  ];  Amaurosis fugax[  ]; Resp: cough [  ];  wheezing[  ];  hemoptysis[  ]; shortness of breath[  ]; paroxysmal nocturnal dyspnea[  ]; dyspnea on exertion[  ]; or orthopnea[  ];  GI:  gallstones[  ], vomiting[  ];  dysphagia[  ]; melena[  ];  hematochezia [  ]; heartburn[  ];   Hx of  Colonoscopy[  ]; GU: kidney stones [  ]; hematuria[  ];   dysuria [  ];  nocturia[  ];  history of     obstruction [  ]; urinary frequency [  ]             Skin: rash, swelling[  ];, hair loss[  ];  peripheral edema[  ];  or itching[  ]; Musculosketetal: myalgias[  ];  joint swelling[  ];  joint erythema[  ];  joint pain[  ];  back pain[  ];  Heme/Lymph: bruising[  ];  bleeding[  ];  anemia[  ];  Neuro: TIA[  ];  headaches[  ];  stroke[  ];  vertigo[  ];  seizures[  ];   paresthesias[  ];  difficulty walking[  ];  Psych:depression[  ]; anxiety[  ];  Endocrine: diabetes[  ];  thyroid dysfunction[  ];  Immunizations: Flu up to date [n  ]; Pneumococcal up to date [n  ]; COVID vacination- no   Other:     PHYSICAL EXAMINATION: BP 130/70   Pulse 64   Temp 97.8 F (36.6 C) (Skin)   Resp 20   Ht 6\' 2"  (1.88 m)   Wt (!) 418 lb (189.6 kg)   SpO2 96% Comment: RA  BMI 53.67 kg/m  General appearance: alert, cooperative, appears stated age and no distress Head: Normocephalic, without obvious abnormality, atraumatic Neck: no adenopathy, no carotid bruit, no JVD, supple, symmetrical, trachea midline and thyroid not enlarged, symmetric, no  tenderness/mass/nodules Lymph nodes: Cervical, supraclavicular, and axillary nodes normal. Resp: clear to auscultation bilaterally Back: symmetric, no curvature. ROM normal. No CVA tenderness. Cardio: regular rate and rhythm, S1, S2 normal, no murmur, click, rub or gallop GI: soft, non-tender; bowel sounds normal; no masses,  no organomegaly Extremities: edema Patient has bilateral ankle edema, no ulcers, gangrene or trophic changes, varicose veins noted and venous stasis dermatitis noted Neurologic: Grossly normal Has palpable DP and PT pulses bilaterally-size of his legs make evaluation veins difficult The patient is left-handed- Modified allen Test was normal (postive) bilateral  Diagnostic Studies & Laboratory data:     Recent Radiology Findings:   Left Heart Catheterization 04/19/20:  LV: Moderately elevated LVEDP.  LM: Mid 40%. Hazy. Severe diffuse multivessel disease. Ectasia of the LAD, RCA. Prox LAD aneurysmal. Occluded after origin of a very large D1. D1 is very large with mid 90% stenosis. Ipsilateral and contralateral (RCA) to mid  LAD noted.  Cx: Prox 70-80% stenosis involves the bifurcation of large OM-1. At the bifurcation there is at least a 80% stenosis. RCA: Diffusely ectatic. PL branch has a focal 50% stenosis.  Slow flow noted in the RCA.   Rec: Complex multivessel CAD and will benefit from CABG to LAD and D1 and OM1 with arterial conduits. Maze and LAA ligation. Patient went into atrial fibrillation in the cath lab. 70 mL contrast used. IV dilt and metoprolol given but did not convert to sinus. He will resume Xarelto tonight.  Patient wants to come in and discuss personally at the office. Not happy about CABG potential.  I have independently reviewed the above radiology studies  and reviewed the findings with the patient.   Recent Lab Findings: Lab Results  Component Value Date   WBC 9.3 04/08/2020   HGB 15.4 04/08/2020   HCT 46.3 04/08/2020   PLT 293 04/08/2020    GLUCOSE 83 04/08/2020   ALT 23 01/17/2020   AST 22 01/17/2020   NA 140 04/08/2020   K 4.3 04/08/2020   CL 102 04/08/2020   CREATININE 0.80 04/08/2020   BUN 10 04/08/2020   CO2 26 04/08/2020   TSH 1.564 01/17/2020   INR 1.2 01/17/2020   ECHO: 1. Poor echo window. Off axis views and measurements may be inaccurate. No parasternal views. Normal LV systolic function with visual EF 55-60%. Left ventricle cavity is mildly dilated. Mild concentric hypertrophy of the left ventricle. Normal global wall motion. Normal diastolic filling pattern, normal LAP. 2. Left atrial cavity is mildly dilated grossly. 3. Structurally normal mitral valve. Mild (Grade I) mitral regurgitation. 4. Trace tricuspid regurgitation. Inadequate TR jet to estimate pulmonary artery systolic pressure. 5. IVC is dilated with poor inspiration collapse consistent with elevated right atrial pressure. Conclusions: Yates DecampJay Ganji, MD [facility] Feb 29, 2020 09:50 PM EDT Electronically Signed on [employer] [place]  Assessment / Plan:   #1 recent onset of intermittent atrial fib-2 episodes documented 1 during cardiac catheterization currently holding sinus rhythm-with consideration for coronary artery bypass grafting the patient could have a concomitant pulmonary vein isolation and placement of atrial clip. #2 significant coronary artery disease with total occlusion of the LAD faint collateral filling, greater than 80% stenosis of a large diagonal supplying collaterals to the LAD, 70 to 80% obtuse marginal disease.  The right coronary artery is large diffusely ectatic but with no high-grade stenosis. #3 history of DVT and pulmonary emboli in the past currently on chronic anticoagulation primarily for history of PE and DVT-but now also because of his atrial fib #4 morbid obesity with BMI of 53  I discussed with the patient and his mother in detail the risks and options for coronary artery bypass graft.  Could consider sequential mammary  artery to the diagonal LAD and radial left to the OM.  With his history of 2 short episodes of atrial fibrillation consider pulmonary vein isolation and placement of atrial clip.  The risks and options and expectations bypass surgery were discussed with the patient.  He is willing to proceed but would like a period of time to stop smoking and lose significant weight.  He seems very dedicated to this.  He has not been vaccinated for Covid I encouraged him while we deciding on timing of surgery to proceed with vaccination.  I will plan to see him back in 3 to 4 weeks to evaluate how he is doing on diet and smoking sensation.     I  spent  65 minutes with  the patient face to face and greater then 50% of the time was spent in counseling and coordination of care.    Delight Ovens MD      [address] [place] [place] [address] Office [phone]     05/03/2020 8:28 AM

## 2020-04-29 ENCOUNTER — Institutional Professional Consult (permissible substitution): Payer: BC Managed Care – PPO | Admitting: Cardiothoracic Surgery

## 2020-04-29 ENCOUNTER — Other Ambulatory Visit: Payer: Self-pay | Admitting: Cardiothoracic Surgery

## 2020-04-29 ENCOUNTER — Other Ambulatory Visit: Payer: Self-pay

## 2020-04-29 VITALS — BP 130/70 | HR 64 | Temp 97.8°F | Resp 20 | Ht 74.0 in | Wt >= 6400 oz

## 2020-04-29 DIAGNOSIS — I2510 Atherosclerotic heart disease of native coronary artery without angina pectoris: Secondary | ICD-10-CM

## 2020-04-29 NOTE — Patient Instructions (Addendum)
Coronary Artery Bypass Grafting  Coronary artery bypass grafting (CABG) is a surgery to bypassor to fix arteries of the heart (coronary arteries) that are narrow or blocked. This narrowing is usually the result of a buildup of fatty deposits (plaques) in the walls of the vessels. The coronary arteries supply the heart with the oxygen and nutrients that it needs to pump blood through your body. In this surgery, a section of blood vessel from another part of the body (usually the chest, arm, or leg) is removed and then placed where it will allow blood to flow around the damaged part of the coronary artery. The new section of blood vessel is called the graft. Tell a health care provider about:  Any allergies you have.  All medicines you are taking or using, including steroids, blood thinners, vitamins, herbs, eye drops, creams, and over-the-counter medicines.  Any problems you or family members have had with anesthetic medicines.  Any blood disorders you have.  Any surgeries you have had.  Any medical conditions you have.  Whether you are pregnant or may be pregnant. What are the risks? Generally, this is a safe procedure. However, problems may occur, including:  Bleeding, which may require transfusions.  Infection.  Allergic reactions to medicines or dyes.  Pain at the surgical site.  Damage to other structures or organs.  Short-term memory loss, confusion, and personality changes.  Heart rhythm problems (arrhythmias).  Stroke.  Heart attack during or after surgery.  Kidney failure. What happens before the procedure? Staying hydrated Follow instructions from your health care provider about hydration, which may include:  Up to 2 hours before the procedure - you may continue to drink clear liquids, such as water, clear fruit juice, black coffee, and plain tea.  Eating and drinking Follow instructions from your health care provider about eating and drinking, which may  include:  8 hours before the procedure - stop eating heavy meals or foods, such as meat, fried foods, or fatty foods.  6 hours before the procedure - stop eating light meals or foods, such as toast or cereal.  6 hours before the procedure - stop drinking milk or drinks that contain milk.  2 hours before the procedure - stop drinking clear liquids. Medicines  Take over-the-counter and prescription medicines only as told by your health care provider.  Ask your health care provider about: ? Changing or stopping your regular medicines. This is especially important if you are taking diabetes medicines or blood thinners. You may be asked to start new medicines and stop taking others. Do not stop medicines or adjust dosages on your own. ? Taking medicines such as aspirin and ibuprofen. These medicines can thin your blood. Do not take these medicines unless your health care provider tells you to take them. ? Taking over-the-counter medicines, vitamins, herbs, and supplements. General instructions  Ask your health care provider: ? How your surgical site will be marked or identified. ? What steps will be taken to help prevent infection. These may include:  Removing hair at the surgery site.  Washing skin with a germ-killing soap.  Taking antibiotic medicine.  You may be asked to shower with a germ-killing soap.  For 3-6 weeks before the procedure, do not use any products that contain nicotine or tobacco, such as cigarettes, e-cigarettes, and chewing tobacco. Quitting smoking is one of the best things you can do for your heart health. If you need help quitting, ask your health care provider.  Talk with your health   care provider about where the grafts will be taken from for your surgery. What happens during the procedure?  An IV will be inserted into one of your veins.  You will be given one or more of the following: ? A medicine to help you relax (sedative). ? A medicine to make you fall  asleep (general anesthetic).  An incision will be made down the front of the chest through the breastbone (sternum). The sternum will be opened so the surgeon can see the heart.  You may or may not be placed on a heart-lung bypass machine. If this machine is used, your heart will be temporarily stopped. The machine will provide oxygen to your blood while the surgeon works on your heart. If the machine is not used, it is called beating heart bypass surgery.  A section of blood vessel will be removed from another part of your body (usually the chest, arm, or leg).  The blood vessel will be attached above and below the blocked artery of your heart. This may be done on more than one artery of the heart.  When the bypass is done, you will be taken off the heart-lung machine if it was used.  If your heart was stopped, it will be restarted and will take over again normally.  Your chest will be closed with special surgical wire to hold your bones together for healing.  Your incision(s) will be closed with stitches (sutures), skin glue, or adhesive strips.  Bandages (dressings) will be placed over the incision(s).  Tubes will remain in your chest and will be connected to a suction device to help drain fluid and reinflate the lungs. The procedure may vary among health care providers and hospitals. What happens after the procedure?  Your blood pressure, heart rate, breathing rate, and blood oxygen level will be monitored until you leave the hospital.  You may wake up with a tube in your throat to help your breathing. You may be connected to a breathing machine. You will not be able to talk while the tube is in place. The tube will be taken out as soon as it is safe.  You will be groggy and may have some pain. You will be given pain medicine to help control the pain.  You may be in the intensive care unit for 1-2 days.  You may be given oxygen to help you breathe.  You will be shown how to do  deep breathing exercises.  You may have to wear compression stockings. These stockings help to prevent blood clots and reduce swelling in your legs.  You may be given new medicines to take after your surgery.  Cardiac rehabilitation will be started while you are in the hospital. This may include education and exercises to help you recover from your surgery. Summary  In this procedure, a section of blood vessel from another part of the body (usually the chest, arm, or leg) is removed and then placed where it will allow blood to bypass the narrowed or blocked part of the coronary artery.  For 3-6 weeks before the procedure, do not use any products that contain nicotine or tobacco, such as cigarettes, e-cigarettes, and chewing tobacco. If you need help quitting, ask your health care provider.  You may or may not be placed on a heart-lung bypass machine during the surgery. If used, this machine will provide oxygen to your blood while the surgeon works on your heart.  You may wake up with a tube in   your throat to help your breathing. You may be connected to a breathing machine. The tube will be taken out as soon as it is safe. This information is not intended to replace advice given to you by your health care provider. Make sure you discuss any questions you have with your health care provider. Document Revised: 06/10/2018 Document Reviewed: 06/10/2018 Elsevier Patient Education  2020 [employer]    Coronary Artery Bypass Grafting, Care After This sheet gives you information about how to care for yourself after your procedure. Your health care provider may also give you more specific instructions. If you have problems or questions, contact your health care provider. What can I expect after the procedure? After the procedure, it is common to have:  Nausea.  Lack of appetite.  Constipation.  Weakness and fatigue.  Depression or irritability.  Pain or discomfort in your incision  areas. Follow these instructions at home: Medicines  Take over-the-counter and prescription medicines only as told by your health care provider. Do not stop taking medicines or start any new medicines without approval from your health care provider.  If you were prescribed an antibiotic medicine, take it as told by your health care provider. Do not stop using the antibiotic even if you start to feel better. Incision care   Follow instructions from your health care provider about how to take care of your incisions. Make sure you: ? Wash your hands with soap and water before and after you change your bandage (dressing). If soap and water are not available, use hand sanitizer. ? Change your dressing as told by your health care provider. ? Leave stitches (sutures), skin glue, or adhesive strips in place. These skin closures may need to stay in place for 2 weeks or longer. If adhesive strip edges start to loosen and curl up, you may trim the loose edges. Do not remove adhesive strips completely unless your health care provider tells you to do that.  Keep incision areas clean, dry, and protected.  Check your incision areas every day for signs of infection. Check for: ? More redness, swelling, or pain. ? More fluid or blood. ? Warmth. ? Pus or a bad smell.  If incisions were made in your legs: ? Avoid crossing your legs. ? Avoid sitting for long periods of time. Change positions every 30 minutes. ? Raise (elevate) your legs when you are sitting. Bathing  Do not take baths, swim, or use a hot tub until your health care provider approves.  Only take sponge baths. Pat the incisions dry. Do not rub incisions with a washcloth or towel.  Ask your health care provider when you can shower. Eating and drinking   Eat foods that are high in fiber, such as beans, nuts, whole grains, and raw fruits and vegetables. Meats, if eaten, should be lean cut. Avoid canned, processed, and fried foods. This  can help prevent constipation and is a recommended part of a heart-healthy diet.  Drink enough fluid to keep your urine pale yellow.  Do not drink alcohol until your recovery is complete. Ask your health care provider when it is safe to drink alcohol. Activity  Rest and limit your activity as told by your health care provider. You may be instructed to: ? Stop any activity right away if you have chest pain, shortness of breath, irregular heartbeats, or dizziness. Get help right away if you have any of these symptoms. ? Move around frequently for short periods or take short walks as  told by your health care provider. Gradually increase your activities. ? Avoid lifting, pushing, or pulling anything that is heavier than 10 lb (4.5 kg) for at least 6 weeks or as told by your health care provider.  Participate in physical therapy or a cardiac rehabilitation program as told by your health care provider. ? Physical therapy involves doing exercises to maintain movement, strengthen your muscles, and build your endurance. ? A cardiac rehabilitation program is a treatment program to improve your health and well-being through exercise training, education, and counseling.  Do not drive until your health care provider approves.  Ask your health care provider when you may return to work.  Ask your health care provider when you may resume sexual activity. General instructions  Do not drive or use heavy machinery while taking prescription pain medicine.  Do not use any products that contain nicotine or tobacco, such as cigarettes, e-cigarettes, and chewing tobacco. If you need help quitting, ask your health care provider.  Take 2-3 deep breaths every few hours during the day, while you recover. This helps expand your lungs and prevent complications like pneumonia after surgery.  If you were given a device called an incentive spirometer, use it several times a day to practice deep breathing. Support your  chest with a pillow or your arms when you take deep breaths or cough.  Wear compression stockings as told by your health care provider. These stockings help to prevent blood clots and reduce swelling in your legs.  Weigh yourself every day. This helps identify if your body is holding (retaining) fluid that may make your heart and lungs work harder.  Keep all follow-up visits as told by your health care provider. This is important. Contact a health care provider if:  You have more redness, swelling, or pain around any incision.  You have more fluid or blood coming from any incision.  Any incision feels warm to the touch.  You have pus or a bad smell coming from any incision.  You have a fever.  You have swelling in your ankles or legs.  You have pain in your legs.  You gain 2 lb (0.9 kg) or more a day.  You are nauseous or you vomit.  You have diarrhea. Get help right away if:  You have chest pain that spreads to your jaw or arms.  You are short of breath.  You have a fast or irregular heartbeat.  You notice a "clicking" in your breastbone (sternum) when you move.  You have any symptoms of a stroke. "BE FAST" is an easy way to remember the main warning signs of a stroke: ? B - Balance. Signs are dizziness, sudden trouble walking, or loss of balance. ? E - Eyes. Signs are trouble seeing or a sudden change in vision. ? F - Face. Signs are sudden weakness or numbness of the face, or the face or eyelid drooping on one side. ? A - Arms. Signs are weakness or numbness in an arm. This happens suddenly and usually on one side of the body. ? S - Speech. Signs are sudden trouble speaking, slurred speech, or trouble understanding what people say. ? T - Time. Time to call emergency services. Write down what time symptoms started.  You have other signs of a stroke, such as: ? A sudden, severe headache with no known cause. ? Nausea or vomiting. ? Seizure. These symptoms may  represent a serious problem that is an emergency. Do not wait to see  if the symptoms will go away. Get medical help right away. Call your local emergency services (911 in the [place] Do not drive yourself to the hospital. Summary  After the procedure, it is common to have pain or discomfort in the incision areas.  Do not take baths, swim, or use a hot tub until your health care provider approves.  Gradually increase your activities. You may need physical therapy or cardiac rehabilitation to help strengthen your muscles and build your endurance.  Weigh yourself every day. This helps identify if your body is holding (retaining) fluid that may make your heart and lungs work harder. This information is not intended to replace advice given to you by your health care provider. Make sure you discuss any questions you have with your health care provider. Document Revised: 06/10/2018 Document Reviewed: 06/10/2018 Elsevier Patient Education  2020 [employer]  Atrial Fibrillation  Atrial fibrillation is a type of heartbeat that is irregular or fast. If you have this condition, your heart beats without any order. This makes it hard for your heart to pump blood in a normal way. Atrial fibrillation may come and go, or it may become a long-lasting problem. If this condition is not treated, it can put you at higher risk for stroke, heart failure, and other heart problems. What are the causes? This condition may be caused by diseases that damage the heart. They include:  High blood pressure.  Heart failure.  Heart valve disease.  Heart surgery. Other causes include:  Diabetes.  Thyroid disease.  Being overweight.  Kidney disease. Sometimes the cause is not known. What increases the risk? You are more likely to develop this condition if:  You are older.  You smoke.  You exercise often and very hard.  You have a family history of this condition.  You are a man.  You use  drugs.  You drink a lot of alcohol.  You have lung conditions, such as emphysema, pneumonia, or COPD.  You have sleep apnea. What are the signs or symptoms? Common symptoms of this condition include:  A feeling that your heart is beating very fast.  Chest pain or discomfort.  Feeling short of breath.  Suddenly feeling light-headed or weak.  Getting tired easily during activity.  Fainting.  Sweating. In some cases, there are no symptoms. How is this treated? Treatment for this condition depends on underlying conditions and how you feel when you have atrial fibrillation. They include:  Medicines to: ? Prevent blood clots. ? Treat heart rate or heart rhythm problems.  Using devices, such as a pacemaker, to correct heart rhythm problems.  Doing surgery to remove the part of the heart that sends bad signals.  Closing an area where clots can form in the heart (left atrial appendage). In some cases, your doctor will treat other underlying conditions. Follow these instructions at home: Medicines  Take over-the-counter and prescription medicines only as told by your doctor.  Do not take any new medicines without first talking to your doctor.  If you are taking blood thinners: ? Talk with your doctor before you take any medicines that have aspirin or NSAIDs, such as ibuprofen, in them. ? Take your medicine exactly as told by your doctor. Take it at the same time each day. ? Avoid activities that could hurt or bruise you. Follow instructions about how to prevent falls. ? Wear a bracelet that says you are taking blood thinners. Or, carry a card that lists what medicines you take.  Lifestyle      Do not use any products that have nicotine or tobacco in them. These include cigarettes, e-cigarettes, and chewing tobacco. If you need help quitting, ask your doctor.  Eat heart-healthy foods. Talk with your doctor about the right eating plan for you.  Exercise regularly as told  by your doctor.  Do not drink alcohol.  Lose weight if you are overweight.  Do not use drugs, including cannabis. General instructions  If you have a condition that causes breathing to stop for a short period of time (apnea), treat it as told by your doctor.  Keep a healthy weight. Do not use diet pills unless your doctor says they are safe for you. Diet pills may make heart problems worse.  Keep all follow-up visits as told by your doctor. This is important. Contact a doctor if:  You notice a change in the speed, rhythm, or strength of your heartbeat.  You are taking a blood-thinning medicine and you get more bruising.  You get tired more easily when you move or exercise.  You have a sudden change in weight. Get help right away if:   You have pain in your chest or your belly (abdomen).  You have trouble breathing.  You have side effects of blood thinners, such as blood in your vomit, poop (stool), or pee (urine), or bleeding that cannot stop.  You have any signs of a stroke. "BE FAST" is an easy way to remember the main warning signs: ? B - Balance. Signs are dizziness, sudden trouble walking, or loss of balance. ? E - Eyes. Signs are trouble seeing or a change in how you see. ? F - Face. Signs are sudden weakness or loss of feeling in the face, or the face or eyelid drooping on one side. ? A - Arms. Signs are weakness or loss of feeling in an arm. This happens suddenly and usually on one side of the body. ? S - Speech. Signs are sudden trouble speaking, slurred speech, or trouble understanding what people say. ? T - Time. Time to call emergency services. Write down what time symptoms started.  You have other signs of a stroke, such as: ? A sudden, very bad headache with no known cause. ? Feeling like you may vomit (nausea). ? Vomiting. ? A seizure. These symptoms may be an emergency. Do not wait to see if the symptoms will go away. Get medical help right away. Call your  local emergency services (911 in the [place] Do not drive yourself to the hospital. Summary  Atrial fibrillation is a type of heartbeat that is irregular or fast.  You are at higher risk of this condition if you smoke, are older, have diabetes, or are overweight.  Follow your doctor's instructions about medicines, diet, exercise, and follow-up visits.  Get help right away if you have signs or symptoms of a stroke.  Get help right away if you cannot catch your breath, or you have chest pain or discomfort. This information is not intended to replace advice given to you by your health care provider. Make sure you discuss any questions you have with your health care provider. Document Revised: 03/25/2019 Document Reviewed: 03/25/2019 Elsevier Patient Education  2020 [employer]

## 2020-05-02 ENCOUNTER — Ambulatory Visit: Payer: BC Managed Care – PPO | Admitting: Cardiology

## 2020-05-05 ENCOUNTER — Ambulatory Visit (HOSPITAL_BASED_OUTPATIENT_CLINIC_OR_DEPARTMENT_OTHER)
Admission: RE | Admit: 2020-05-05 | Discharge: 2020-05-05 | Disposition: A | Discharge status: Home / Self Care | Payer: BC Managed Care – PPO | Source: Ambulatory Visit | Attending: Cardiothoracic Surgery | Admitting: Cardiothoracic Surgery

## 2020-05-05 ENCOUNTER — Other Ambulatory Visit: Payer: Self-pay

## 2020-05-05 ENCOUNTER — Ambulatory Visit (HOSPITAL_COMMUNITY)
Admission: RE | Admit: 2020-05-05 | Discharge: 2020-05-05 | Disposition: A | Discharge status: Home / Self Care | Payer: BC Managed Care – PPO | Source: Ambulatory Visit | Attending: Cardiothoracic Surgery | Admitting: Cardiothoracic Surgery

## 2020-05-05 DIAGNOSIS — I2510 Atherosclerotic heart disease of native coronary artery without angina pectoris: Secondary | ICD-10-CM | POA: Diagnosis present

## 2020-05-05 NOTE — Progress Notes (Signed)
Lower extremity mapping & pre cabg has been completed.   Preliminary results in CV Proc.   Blanch Media 05/05/2020 10:03 AM

## 2020-05-12 ENCOUNTER — Ambulatory Visit: Payer: BC Managed Care – PPO | Admitting: Cardiology

## 2020-05-16 ENCOUNTER — Encounter: Payer: Self-pay | Admitting: Cardiology

## 2020-05-16 ENCOUNTER — Ambulatory Visit: Payer: BC Managed Care – PPO | Admitting: Cardiology

## 2020-05-16 ENCOUNTER — Other Ambulatory Visit: Payer: Self-pay

## 2020-05-16 VITALS — BP 130/81 | HR 59 | Resp 17 | Ht 74.0 in | Wt 395.0 lb

## 2020-05-16 DIAGNOSIS — I480 Paroxysmal atrial fibrillation: Secondary | ICD-10-CM

## 2020-05-16 DIAGNOSIS — I10 Essential (primary) hypertension: Secondary | ICD-10-CM

## 2020-05-16 DIAGNOSIS — I25118 Atherosclerotic heart disease of native coronary artery with other forms of angina pectoris: Secondary | ICD-10-CM

## 2020-05-16 NOTE — Progress Notes (Signed)
Primary Physician/Referring:  Georgann Housekeeper, MD  Patient ID: Joseph Lang, male    DOB: 07/09/1976, 44 y.o.   MRN: [id number]  Chief Complaint  Patient presents with  . Coronary Artery Disease  . Atrial Fibrillation  . Follow-up    3 week   HPI:   Visit date not found  Joseph Lang  is a 44 y.o. Caucasian  male patient with past medical history of paroxysmal atrial fibrillation and cardioversion on 01/17/2020, hypertension, morbid obesity, obstructive sleep apnea on CPAP, tobacco use disorder, history of acute deep vein thrombosis and pulmonary embolism on 04/08/2015 following gastric sleeve resection 6 weeks prior, and again presented with spontaneous right leg DVT on 03/07/2019 presently on long term Xarelto, underwent cardiac catheterization for abnormal stress test and new onset atrial fibrillation on 04/19/2020 revealing multivessel disease, patient went back into A. fib with RVR during cardiac catheterization.    He underwent repeat direct-current cardioversion on 04/26/2020 which was successful.  He has been evaluated by Dr. Elise Benne and awaiting CABG. he is trying to lose as much weight as possible prior to CABG.  He feels well and he has not had recurrence of angina pectoris and he is completely quit smoking.  Past Medical History:  Diagnosis Date  . Hx of deep vein thrombophlebitis of lower extremity   . Hx of gastric bypass   . Hx of pulmonary embolus   . Hypertension   . Obesity   . Sleep apnea   . Tobacco abuse    Past Surgical History:  Procedure Laterality Date  . CARDIOVERSION    . CARDIOVERSION N/A 04/19/2020   Procedure: CARDIOVERSION;  Surgeon: Yates Decamp, MD;  Location: [facility] ENDOSCOPY;  Service: Cardiovascular;  Laterality: N/A;  . CARDIOVERSION N/A 04/26/2020   Procedure: CARDIOVERSION;  Surgeon: Yates Decamp, MD;  Location: [facility] ENDOSCOPY;  Service: Cardiovascular;  Laterality: N/A;  . HIP SURGERY    . LAPAROSCOPIC GASTRIC SLEEVE RESECTION N/A 02/14/2015   Procedure:  LAPAROSCOPIC GASTRIC SLEEVE RESECTION WITH UPPER ENDOSCOPY;  Surgeon: Luretha Murphy, MD;  Location: [facility] ORS;  Service: General;  Laterality: N/A;  . LEFT HEART CATH AND CORONARY ANGIOGRAPHY N/A 04/19/2020   Procedure: LEFT HEART CATH AND CORONARY ANGIOGRAPHY;  Surgeon: Yates Decamp, MD;  Location: [facility] INVASIVE CV LAB;  Service: Cardiovascular;  Laterality: N/A;  . PELVIS CLOSED REDUCTION     Family History  Problem Relation Age of Onset  . Atrial fibrillation Mother   . CAD Mother   . Heart disease Mother   . CAD Father   . Atrial fibrillation Father     Social History   Tobacco Use  . Smoking status: Former Smoker    Packs/day: 0.50    Years: 28.00    Pack years: 14.00    Types: Cigarettes    Quit date: 04/20/2020    Years since quitting: 0.0  . Smokeless tobacco: Never Used  . Tobacco comment: half a pack daily  Substance Use Topics  . Alcohol use: Yes    Comment: occ   Marital Status: Single  ROS  Review of Systems  Constitutional: Positive for malaise/fatigue.  Cardiovascular: Positive for dyspnea on exertion and leg swelling (chronic). Negative for chest pain.  Gastrointestinal: Negative for melena.  Psychiatric/Behavioral: Positive for depression. The patient is nervous/anxious.    Objective  Blood pressure (!) 133/81, pulse 59, resp. rate 17, height 6\' 2"  (1.88 m), weight (!) 395 lb (179.2 kg), SpO2 96 %.  Vitals with BMI 05/16/2020  04/29/2020 04/26/2020  Height 6\' 2"  6\' 2"  -  Weight 395 lbs 418 lbs -  BMI 50.69 53.65 -  Systolic 133 130 [id number]  Diastolic 81 70 65  Pulse 59 64 64     Physical Exam Constitutional:      Comments: He is well-built and morbidly obese in no acute distress.  Cardiovascular:     Rate and Rhythm: Normal rate and regular rhythm.     Pulses: Normal pulses and intact distal pulses.     Heart sounds: No murmur heard.  No gallop. No S3 or S4 sounds.      Comments: No JVD. Chronic venous stasis pigmentation of bilateral lower extremity.  Bilateral  1 leg edema present, pitting. Pulmonary:     Effort: Pulmonary effort is normal.     Breath sounds: Normal breath sounds.  Abdominal:     General: Bowel sounds are normal.     Palpations: Abdomen is soft.    Laboratory examination:   Recent Labs    01/17/20 0756 04/08/20 0936  NA 142 140  K 4.0 4.3  CL 107 102  CO2 22 26  GLUCOSE 110* 83  BUN 10 10  CREATININE 0.78 0.80  CALCIUM 9.7 10.2  GFRNONAA >60 107  GFRAA >60 124   CrCl cannot be calculated (Patient's most recent lab result is older than the maximum 21 days allowed.).  CMP Latest Ref Rng & Units 04/08/2020 01/17/2020 03/07/2019  Glucose 65 - 99 mg/dL 83 308(M110(H) 578(I113(H)  BUN 6 - 24 mg/dL 10 10 10   Creatinine 0.76 - 1.27 mg/dL 6.960.80 2.950.78 2.840.83  Sodium 134 - 144 mmol/L 140 142 142  Potassium 3.5 - 5.2 mmol/L 4.3 4.0 4.1  Chloride 96 - 106 mmol/L 102 107 108  CO2 20 - 29 mmol/L 26 22 24   Calcium 8.7 - 10.2 mg/dL 13.210.2 9.7 44.010.3  Total Protein 6.5 - 8.1 g/dL - 6.6 -  Total Bilirubin 0.3 - 1.2 mg/dL - 0.9 -  Alkaline Phos 38 - 126 U/L - 71 -  AST 15 - 41 U/L - 22 -  ALT 0 - 44 U/L - 23 -   CBC Latest Ref Rng & Units 04/08/2020 01/17/2020 03/07/2019  WBC 3.4 - 10.8 x10E3/uL 9.3 9.9 11.4(H)  Hemoglobin 13.0 - 17.7 g/dL 10.215.4 72.515.9 36.614.9  Hematocrit 37.5 - 51.0 % 46.3 50.6 45.2  Platelets 150 - 450 x10E3/uL 293 243 284   Lipid Panel No results for input(s): CHOL, TRIG, LDLCALC, VLDL, HDL, CHOLHDL, LDLDIRECT in the last 8760 hours.  HEMOGLOBIN A1C No results found for: HGBA1C, MPG TSH Recent Labs    01/17/20 0813  TSH 1.564   External labs:   Cholesterol, total 230.000 11/03/2019 HDL 42.000 11/03/2019 LDL-C 158.000 11/03/2019 Triglycerides 164.000 11/03/2019  A1C 5.500 11/03/2019 TSH 1.564 01/17/2020  Medications and allergies   Allergies  Allergen Reactions  . Amoxicillin Nausea And Vomiting    Outpatient Medications Prior to Visit  Medication Sig Dispense Refill  . amiodarone (PACERONE) 200 MG tablet Take 1 tablet  (200 mg total) by mouth daily. 60 tablet 2  . aspirin EC 81 MG tablet Take 1 tablet (81 mg total) by mouth daily. 90 tablet 3  . atorvastatin (LIPITOR) 80 MG tablet Take 1 tablet (80 mg total) by mouth daily. 30 tablet 2  . benazepril (LOTENSIN) 20 MG tablet Take 20 mg by mouth 2 (two) times daily.     Marland Kitchen. diltiazem (CARDIZEM CD) 240 MG 24 hr capsule Take 1 capsule (  240 mg total) by mouth daily. 30 capsule 3  . metoprolol succinate (TOPROL-XL) 50 MG 24 hr tablet Take 1 tablet (50 mg total) by mouth daily. Take with or immediately following a meal. 30 tablet 2  . RABEprazole (ACIPHEX) 20 MG tablet Take 20 mg by mouth daily.     Carlena Hurl 20 MG TABS tablet Take 20 mg by mouth daily.    . nitroGLYCERIN (NITROSTAT) 0.4 MG SL tablet Place 1 tablet (0.4 mg total) under the tongue every 5 (five) minutes as needed for chest pain. If you require more than two tablets five minutes apart go to the nearest ER via EMS. (Patient not taking: Reported on 05/16/2020) 30 tablet 0   No facility-administered medications prior to visit.   No orders of the defined types were placed in this encounter.  There are no discontinued medications.    Radiology:   PORTABLE CHEST 1 VIEW 01/17/2020 COMPARISON:  04/08/2015 Cardiac silhouette is normal in size. No mediastinal or hilar masses. No evidence of adenopathy. Lungs are clear.  No pleural effusion or pneumothorax. Skeletal structures are grossly intact.  Cardiac Studies:   Echocardiogram: 02/29/2020: LVEF 55-60%, mildly dilated left ventricular cavity, mild LVH, normal diastolic filling pattern, normal left atrial pressure, mildly dilated left atrium, mild MR, trace TR.  Stress Testing: Lexiscan Sestamibi stress test 02/29/2020: Stress EKG at 78% MPHR shows sinus tachycardia, nonspecific T wave inversion in inferior leads, frequent PVC's.  SPECT images show large sized, medium intensity, predominantly reversible perfusion defect in basal to apical, inferoseptal,  inferior, and inferolateral myocadium. While study quality may be limited due to attenuation (BMI 52), ischemia cannot be excluded. Stress LVEF 65%. Recommend clinical correlation. High risk study.   Vascular imaging: Ultrasound venous duplex: 03/07/2019: Right: Findings consistent with acute deep vein thrombosis involving the right popliteal vein. There is evidence of superficial vein thrombosis involving a varicose vein of the proximal, medial, right calf.  Left: No evidence of common femoral vein obstruction.  Left Heart Catheterization 04/19/20:  LV: Moderately elevated LVEDP.  LM: Mid 40%. Hazy. Severe diffuse multivessel disease. Ectasia of the LAD, RCA. Prox LAD aneurysmal. Occluded after origin of a very large D1. D1 is very large with mid 90% stenosis. Ipsilateral and contralateral (RCA) to mid LAD noted.  Cx: Prox 70-80% stenosis involves the bifurcation of large OM-1. At the bifurcation there is at least a 80% stenosis. RCA: Diffusely ectatic. PL branch has a focal 50% stenosis.  Slow flow noted in the RCA.   Rec: Complex multivessel CAD and will benefit from CABG to LAD and D1 and OM1 with arterial conduits. Maze and LAA ligation. Patient went into atrial fibrillation in the cath lab. 70 mL contrast used. IV dilt and metoprolol given but did not convert to sinus. He will resume Xarelto tonight. Patient wants to come in and discuss personally at the office. Not happy about CABG potential.  EKG  EKG 05/16/2020: Normal sinus rhythm with rate of 62 bpm, normal axis, anteroseptal infarct old.  Low-voltage complexes.  Pulmonary disease pattern.  No significant change from EKG 03/31/2020.  Assessment     ICD-10-CM   1. Coronary artery disease involving native coronary artery of native heart with other form of angina pectoris (HCC)  I25.118 EKG 12-Lead  2. Paroxysmal atrial fibrillation (HCC). CHA2DS2-VASc Score is 2.  Yearly risk of stroke: 2.3% (CAD, HTN).   I48.0   3. Primary  hypertension  I10     CHA2DS2-VASc Score is 2.  Yearly risk of stroke: 2.3% (CAD, HTN).  Score of 1=1.3; 2=2.2; 3=3.2; 4=4; 5=6.7; 6=9.8; 7=>9.8) -(CHF; HTN; vasc disease DM,  Male = 1; Age <65 =0; 65-74 = 1,  >75 =2; stroke = 2).    There are no discontinued medications.   Recommendations:   Joseph Lang  is a 44 y.o. White or Caucasian  male patient with past medical history of paroxysmal atrial fibrillation and cardioversion on 01/17/2020, hypertension, morbid obesity, obstructive sleep apnea on CPAP, tobacco use disorder, history of acute deep vein thrombosis and pulmonary embolism on 04/08/2015 following gastric sleeve resection 6 weeks prior, and again presented with spontaneous right leg DVT on 03/07/2019 presently on long term Xarelto, underwent cardiac catheterization for abnormal stress test and new onset atrial fibrillation on 04/19/2020 revealing multivessel disease, patient went back into A. fib with RVR during cardiac catheterization.    He underwent repeat direct-current cardioversion on 04/26/2020 which was successful.  He has been evaluated by Dr. Elise Benne and awaiting CABG but giving time for patient to lose weight, he has lost about 19 pounds over the past 1 month.  He remains without any angina but he has not returned to work as I have taken him out of work in view of complexity of multivessel disease.  No clinical evidence of heart failure.  He is maintaining sinus rhythm.  Continue amiodarone for now.  I will see him back in 6 weeks for follow-up, hopefully he will not completed his bypass surgery by that time.   Yates Decamp, MD, [facility] 05/16/2020, 11:48 AM Office: [phone]

## 2020-05-26 ENCOUNTER — Ambulatory Visit: Payer: BC Managed Care – PPO | Admitting: Cardiothoracic Surgery

## 2020-05-26 ENCOUNTER — Other Ambulatory Visit: Payer: Self-pay

## 2020-05-26 VITALS — BP 170/80 | HR 54 | Temp 98.2°F | Resp 20 | Ht 74.0 in | Wt 397.0 lb

## 2020-05-26 DIAGNOSIS — I2510 Atherosclerotic heart disease of native coronary artery without angina pectoris: Secondary | ICD-10-CM

## 2020-05-26 NOTE — Progress Notes (Signed)
[address]       [place] [address]             [phone]                    [facility] Medical Record #[id number] Date of Birth: 29-May-1976  Referring: Yates Decamp, MD Primary Care: Georgann Housekeeper, MD Primary Cardiologist: No primary care provider on file.  Chief Complaint:    Chief Complaint  Patient presents with  . Coronary Artery Disease    discuss  VASCULAR EVALUATION, possible scheduling surgery    History of Present Illness:    Joseph Lang 44 y.o. male is seen in the office  today for evaluation of possible coronary artery bypass grafting.  He was seen several weeks ago to consider coronary artery bypass grafting , since last seen he has made an effort with diet and weight control -he has dropped his weight 25 pounds .  He has had no recurrent shortness of breath chest pain or episodes of atrial fibrillation.   The patient underwent recent cardiac catheterization by Dr. Jacinto Halim and was referred following this.  The patient notes that he began having increasing symptoms, Easter 2021 when he developed atrial fibrillation, he was cardioverted in the emergency room and discharged home.  Troponins were noted to be mildly elevated high sensitive troponin at 153.  Follow-up stress test was performed by Dr. Jacinto Halim leading to cardiac catheterization.  At the time of catheterization the patient presented in sinus rhythm but during the cath procedure went back into atrial fib.  Last week he underwent cardioversion and has remained in sinus rhythm since.   Patient has a history of obstructive sleep apnea.  He had a sleeve gastrectomy for weight loss by Dr. Daphine Deutscher 5 years ago.  Several months following surgery developed multiple pulmonary emboli and was started on anticoagulation.  In 02/12/19 he had DVT and is now on Xarelto for anticoagulation even prior to the onset of atrial fibrillation.  His family history is strong for both atrial fib and coronary  artery disease, both his mother and father have had bypass surgery.  His father died in 11-Feb-2018 during carotid artery procedure. Patient smokes 1 pack of cigarettes since a day since age 47, he has a high intake of caffeine and soft drinks.  Patient has had a long-term problem with his excessive weight, notes initially after gastric sleeve resection he reduced his weight weight down to just over 300 pounds.  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  Patient is in the midst of completing his second Covid vaccination which will be next week  Current Activity/ Functional Status:  Patient is independent with mobility/ambulation, transfers, ADL's, IADL's.   Zubrod Score: At the time of surgery this patient's most appropriate activity status/level should be described as: []     0    Normal activity, no symptoms [x]     1    Restricted in physical strenuous activity but ambulatory, able to do out light work []     2    Ambulatory and capable of self care, unable to do work activities, up and about               >50 % of waking hours                              []   3    Only limited self care, in bed greater than 50% of waking hours []     4    Completely disabled, no self care, confined to bed or chair []     5    Moribund   Past Medical History:  Diagnosis Date  . Hx of deep vein thrombophlebitis of lower extremity   . Hx of gastric bypass   . Hx of pulmonary embolus   . Hypertension   . Obesity   . Sleep apnea   . Tobacco abuse     Past Surgical History:  Procedure Laterality Date  . CARDIOVERSION    . CARDIOVERSION N/A 04/19/2020   Procedure: CARDIOVERSION;  Surgeon: , MD;  Location: [facility] ENDOSCOPY;  Service: Cardiovascular;  Laterality: N/A;  . CARDIOVERSION N/A 04/26/2020   Procedure: CARDIOVERSION;  Surgeon: [facility], MD;  Location: [facility] ENDOSCOPY;  Service: Cardiovascular;   Laterality: N/A;  . HIP SURGERY    . LAPAROSCOPIC GASTRIC SLEEVE RESECTION N/A 02/14/2015   Procedure: LAPAROSCOPIC GASTRIC SLEEVE RESECTION WITH UPPER ENDOSCOPY;  Surgeon: [facility], MD;  Location: [facility] ORS;  Service: General;  Laterality: N/A;  . LEFT HEART CATH AND CORONARY ANGIOGRAPHY N/A 04/19/2020   Procedure: LEFT HEART CATH AND CORONARY ANGIOGRAPHY;  Surgeon: Luretha Murphy, MD;  Location: [facility] INVASIVE CV LAB;  Service: Cardiovascular;  Laterality: N/A;  . PELVIS CLOSED REDUCTION      Family History  Problem Relation Age of Onset  . Atrial fibrillation Mother   . CAD Mother   . Heart disease Mother   . CAD Father   . Atrial fibrillation Father      Social History   Tobacco Use  Smoking Status Former Smoker  . Packs/day: 0.50  . Years: 28.00  . Pack years: 14.00  . Types: Cigarettes  . Quit date: 04/20/2020  . Years since quitting: 0.0  Smokeless Tobacco Never Used  Tobacco Comment   half a pack daily    Social History   Substance and Sexual Activity  Alcohol Use Yes   Comment: occ     Allergies  Allergen Reactions  . Amoxicillin Nausea And Vomiting    Current Outpatient Medications  Medication Sig Dispense Refill  . amiodarone (PACERONE) 200 MG tablet Take 1 tablet (200 mg total) by mouth daily. 60 tablet 2  . aspirin EC 81 MG tablet Take 1 tablet (81 mg total) by mouth daily. 90 tablet 3  . atorvastatin (LIPITOR) 80 MG tablet Take 1 tablet (80 mg total) by mouth daily. 30 tablet 2  . benazepril (LOTENSIN) 20 MG tablet Take 20 mg by mouth 2 (two) times daily.     Yates Decamp diltiazem (CARDIZEM CD) 240 MG 24 hr capsule Take 1 capsule (240 mg total) by mouth daily. 30 capsule 3  . metoprolol succinate (TOPROL-XL) 50 MG 24 hr tablet Take 1 tablet (50 mg total) by mouth daily. Take with or immediately following a meal. 30 tablet 2  . nitroGLYCERIN (NITROSTAT) 0.4 MG SL tablet Place 1 tablet (0.4 mg total) under the tongue every 5 (five) minutes as needed for chest pain. If  you require more than two tablets five minutes apart go to the nearest ER via EMS. 30 tablet 0  . RABEprazole (ACIPHEX) 20 MG tablet Take 20 mg by mouth daily.     06/21/2020 20 MG TABS tablet Take 20 mg by mouth daily.     No current facility-administered medications for this visit.    Pertinent items are  noted in HPI.   Review of Systems:     Cardiac Review of Systems: [Y] = yes  or   [ N ] = no   Chest Pain Klaus.Mock[N  ]  Resting SOB [ N] Exertional SOB  [ Y ]  Orthopnea [ n ]   Pedal Edema Klaus.Mock[N  ]    Palpitations [Y] Syncope  Klaus.Mock[N  ]   Presyncope Klaus.Mock[N   ]   General Review of Systems: [Y] = yes [  ]=no Constitional: recent weight change [  ];  Wt loss over the last 3 months [n   ] anorexia [  ]; fatigue [  ]; nausea [  ]; night sweats [  ]; fever [  ]; or chills [  ];           Eye : blurred vision [  ]; diplopia [   ]; vision changes [  ];  Amaurosis fugax[  ]; Resp: cough [  ];  wheezing[  ];  hemoptysis[  ]; shortness of breath[  ]; paroxysmal nocturnal dyspnea[  ]; dyspnea on exertion[  ]; or orthopnea[  ];  GI:  gallstones[  ], vomiting[  ];  dysphagia[  ]; melena[  ];  hematochezia [  ]; heartburn[  ];   Hx of  Colonoscopy[  ]; GU: kidney stones [  ]; hematuria[  ];   dysuria [  ];  nocturia[  ];  history of     obstruction [  ]; urinary frequency [  ]             Skin: rash, swelling[  ];, hair loss[  ];  peripheral edema[  ];  or itching[  ]; Musculosketetal: myalgias[  ];  joint swelling[  ];  joint erythema[  ];  joint pain[  ];  back pain[  ];  Heme/Lymph: bruising[  ];  bleeding[  ];  anemia[  ];  Neuro: TIA[  ];  headaches[  ];  stroke[  ];  vertigo[  ];  seizures[  ];   paresthesias[  ];  difficulty walking[  ];  Psych:depression[  ]; anxiety[  ];  Endocrine: diabetes[  ];  thyroid dysfunction[  ];  Immunizations: Flu up to date [n  ]; Pneumococcal up to date [n  ]; COVID vacination-  IN PROCESS   Other:     PHYSICAL EXAMINATION: BP (!) 170/80   Pulse (!) 54   Temp 98.2 F (36.8  C) (Skin)   Resp 20   Ht 6\' 2"  (1.88 m)   Wt (!) 397 lb (180.1 kg)   SpO2 97%   BMI 50.97 kg/m  General appearance: alert, cooperative, appears stated age and no distress Head: Normocephalic, without obvious abnormality, atraumatic Neck: no adenopathy, no carotid bruit, no JVD, supple, symmetrical, trachea midline and thyroid not enlarged, symmetric, no tenderness/mass/nodules Lymph nodes: Cervical, supraclavicular, and axillary nodes normal. Resp: clear to auscultation bilaterally Back: symmetric, no curvature. ROM normal. No CVA tenderness. Cardio: regular rate and rhythm, S1, S2 normal, no murmur, click, rub or gallop GI: soft, non-tender; bowel sounds normal; no masses,  no organomegaly Extremities: edema Patient has bilateral ankle edema, no ulcers, gangrene or trophic changes, varicose veins noted and venous stasis dermatitis noted Neurologic: Grossly normal Has palpable DP and PT pulses bilaterally-size of his legs make evaluation veins difficult The patient is left-handed- Modified allen Test was normal (postive) bilateral  Diagnostic Studies & Laboratory data:     Recent Radiology Findings:  Left Heart Catheterization 04/19/20:  LV: Moderately elevated LVEDP.  LM: Mid 40%. Hazy. Severe diffuse multivessel disease. Ectasia of the LAD, RCA. Prox LAD aneurysmal. Occluded after origin of a very large D1. D1 is very large with mid 90% stenosis. Ipsilateral and contralateral (RCA) to mid LAD noted.  Cx: Prox 70-80% stenosis involves the bifurcation of large OM-1. At the bifurcation there is at least a 80% stenosis. RCA: Diffusely ectatic. PL branch has a focal 50% stenosis.  Slow flow noted in the RCA.   Rec: Complex multivessel CAD and will benefit from CABG to LAD and D1 and OM1 with arterial conduits. Maze and LAA ligation. Patient went into atrial fibrillation in the cath lab. 70 mL contrast used. IV dilt and metoprolol given but did not convert to sinus. He will resume  Xarelto tonight.  Patient wants to come in and discuss personally at the office. Not happy about CABG potential.  I have independently reviewed the above radiology studies  and reviewed the findings with the patient.   Recent Lab Findings: Lab Results  Component Value Date   WBC 9.3 04/08/2020   HGB 15.4 04/08/2020   HCT 46.3 04/08/2020   PLT 293 04/08/2020   GLUCOSE 83 04/08/2020   ALT 23 01/17/2020   AST 22 01/17/2020   NA 140 04/08/2020   K 4.3 04/08/2020   CL 102 04/08/2020   CREATININE 0.80 04/08/2020   BUN 10 04/08/2020   CO2 26 04/08/2020   TSH 1.564 01/17/2020   INR 1.2 01/17/2020   ECHO: 1. Poor echo window. Off axis views and measurements may be inaccurate. No parasternal views. Normal LV systolic function with visual EF 55-60%. Left ventricle cavity is mildly dilated. Mild concentric hypertrophy of the left ventricle. Normal global wall motion. Normal diastolic filling pattern, normal LAP. 2. Left atrial cavity is mildly dilated grossly. 3. Structurally normal mitral valve. Mild (Grade I) mitral regurgitation. 4. Trace tricuspid regurgitation. Inadequate TR jet to estimate pulmonary artery systolic pressure. 5. IVC is dilated with poor inspiration collapse consistent with elevated right atrial pressure. Conclusions: Yates Decamp, MD [facility] Feb 29, 2020 09:50 PM EDT Electronically Signed on [employer]  Assessment / Plan:   #1 recent onset of intermittent atrial fib-2 episodes documented 1 during cardiac catheterization currently holding sinus rhythm-with consideration for coronary artery bypass grafting the patient could have a concomitant pulmonary vein isolation and placement of atrial clip. #2 significant coronary artery disease with total occlusion of the LAD faint collateral filling, greater than 80% stenosis of a large diagonal supplying collaterals to the LAD, 70 to 80% obtuse marginal disease.  The right coronary artery is large diffusely ectatic but with  no high-grade stenosis. #3 history of DVT and pulmonary emboli in the past currently on chronic anticoagulation primarily for history of PE and DVT-but now also because of his atrial fib #4 morbid obesity with BMI of 53  I discussed with the patient and his mother in detail the risks and options for coronary artery bypass graft.  Could consider sequential mammary artery to the diagonal LAD and radial left to the OM.  With his history of 2 short episodes of atrial fibrillation consider pulmonary vein isolation and placement of atrial clip.  The risks and options and expectations bypass surgery were discussed with the patient.   I will plan to see him back in 3 to 4 weeks to evaluate how he is doing on diet and smoking sensation.    We will schedule patient's  coronary artery bypass grafting with possible use of left radial artery Maze procedure and left atrial clipping for September 13, this will give further opportunity for weight reduction and also for him to complete full vaccination schedule for Covid.  Delight Ovens MD      [address] [place] [place] [address] Office [phone]     05/26/2020 12:56 PM

## 2020-05-27 ENCOUNTER — Other Ambulatory Visit (HOSPITAL_COMMUNITY): Payer: Self-pay | Admitting: Physician Assistant

## 2020-05-31 ENCOUNTER — Encounter: Payer: Self-pay | Admitting: *Deleted

## 2020-05-31 ENCOUNTER — Other Ambulatory Visit: Payer: Self-pay | Admitting: *Deleted

## 2020-05-31 DIAGNOSIS — I2510 Atherosclerotic heart disease of native coronary artery without angina pectoris: Secondary | ICD-10-CM

## 2020-06-05 ENCOUNTER — Encounter: Payer: Self-pay | Admitting: Cardiology

## 2020-06-15 ENCOUNTER — Encounter: Payer: Self-pay | Admitting: Cardiology

## 2020-06-22 ENCOUNTER — Other Ambulatory Visit: Payer: Self-pay | Admitting: Cardiology

## 2020-06-22 DIAGNOSIS — I25118 Atherosclerotic heart disease of native coronary artery with other forms of angina pectoris: Secondary | ICD-10-CM

## 2020-06-22 NOTE — Pre-Procedure Instructions (Signed)
[facility] - [place], [place] - [address] [place] [address] Phone: [phone] Fax: [phone]  [id number] - [place], [place] - [address] [place] [address] Phone: [phone] Fax: [phone]      Your procedure is scheduled on Monday September 13th.  Report to [facility] Main Entrance "A" at 5:30 A.M., and check in at the Admitting office.  Call this number if you have problems the morning of surgery:  [phone]  Call [phone] if you have any questions prior to your surgery date Monday-Friday 8am-4pm    Remember:  Do not eat or drink after midnight the night before your surgery    Take these medicines the morning of surgery with A SIP OF WATER   amiodarone (PACERONE) 200 MG tablet  atorvastatin (LIPITOR) 80 MG tablet  diltiazem (CARDIZEM CD) 240 MG 24 hr capsule   metoprolol succinate (TOPROL-XL) 50 MG 24 hr tablet  nitroGLYCERIN (NITROSTAT) 0.4 MG SL tablet if needed  RABEprazole (ACIPHEX) 20 MG tablet  As of today, STOP taking any Aspirin (unless otherwise instructed by your surgeon) Aleve, Naproxen, Ibuprofen, Motrin, Advil, Goody's, BC's, all herbal medications, fish oil, and all vitamins.                      Do not wear jewelry            Do not wear lotions, powders, \colognes, or deodorant.            Do not shave 48 hours prior to surgery.  Men may shave face and neck.            Do not bring valuables to the hospital.            [facility] is not responsible for any belongings or valuables.  Do NOT Smoke (Tobacco/Vaping) or drink Alcohol 24 hours prior to your procedure If you use a CPAP at night, you may bring all equipment for your overnight stay.   Contacts, glasses, dentures or bridgework may not be worn into surgery.      For patients admitted to the hospital, discharge time will be determined by your treatment team.   Patients discharged the day of  surgery will not be allowed to drive home, and someone needs to stay with them for 24 hours.    Special instructions:   [place]- Preparing For Surgery  Before surgery, you can play an important role. Because skin is not sterile, your skin needs to be as free of germs as possible. You can reduce the number of germs on your skin by washing with CHG (chlorahexidine gluconate) Soap before surgery.  CHG is an antiseptic cleaner which kills germs and bonds with the skin to continue killing germs even after washing.    Oral Hygiene is also important to reduce your risk of infection.  Remember - BRUSH YOUR TEETH THE MORNING OF SURGERY WITH YOUR REGULAR TOOTHPASTE  Please do not use if you have an allergy to CHG or antibacterial soaps. If your skin becomes reddened/irritated stop using the CHG.  Do not shave (including legs and underarms) for at least 48 hours prior to first CHG shower. It is OK to shave your face.  Please follow these instructions carefully.   1. Shower the NIGHT BEFORE SURGERY and the MORNING OF SURGERY with CHG Soap.   2. If you chose to wash your hair, wash your hair  first as usual with your normal shampoo.  3. After you shampoo, rinse your hair and body thoroughly to remove the shampoo.  4. Use CHG as you would any other liquid soap. You can apply CHG directly to the skin and wash gently with a scrungie or a clean washcloth.   5. Apply the CHG Soap to your body ONLY FROM THE NECK DOWN.  Do not use on open wounds or open sores. Avoid contact with your eyes, ears, mouth and genitals (private parts). Wash Face and genitals (private parts)  with your normal soap.   6. Wash thoroughly, paying special attention to the area where your surgery will be performed.  7. Thoroughly rinse your body with warm water from the neck down.  8. DO NOT shower/wash with your normal soap after using and rinsing off the CHG Soap.  9. Pat yourself dry with a CLEAN TOWEL.  10. Wear CLEAN  PAJAMAS to bed the night before surgery  11. Place CLEAN SHEETS on your bed the night of your first shower and DO NOT SLEEP WITH PETS.   Day of Surgery: Wear Clean/Comfortable clothing the morning of surgery Do not apply any deodorants/lotions.   Remember to brush your teeth WITH YOUR REGULAR TOOTHPASTE.   Please read over the following fact sheets that you were given.

## 2020-06-23 ENCOUNTER — Encounter (HOSPITAL_COMMUNITY): Payer: Self-pay

## 2020-06-23 ENCOUNTER — Other Ambulatory Visit: Payer: Self-pay

## 2020-06-23 ENCOUNTER — Telehealth: Payer: Self-pay

## 2020-06-23 ENCOUNTER — Encounter (HOSPITAL_COMMUNITY)
Admission: RE | Admit: 2020-06-23 | Discharge: 2020-06-23 | Disposition: A | Discharge status: Home / Self Care | Payer: BC Managed Care – PPO | Source: Ambulatory Visit | Attending: Cardiothoracic Surgery | Admitting: Cardiothoracic Surgery

## 2020-06-23 ENCOUNTER — Other Ambulatory Visit (HOSPITAL_COMMUNITY)
Admission: RE | Admit: 2020-06-23 | Discharge: 2020-06-23 | Disposition: A | Discharge status: Home / Self Care | Payer: BC Managed Care – PPO | Source: Ambulatory Visit | Attending: Cardiothoracic Surgery | Admitting: Cardiothoracic Surgery

## 2020-06-23 DIAGNOSIS — I2510 Atherosclerotic heart disease of native coronary artery without angina pectoris: Secondary | ICD-10-CM | POA: Diagnosis not present

## 2020-06-23 DIAGNOSIS — Z01818 Encounter for other preprocedural examination: Secondary | ICD-10-CM | POA: Insufficient documentation

## 2020-06-23 DIAGNOSIS — Z20822 Contact with and (suspected) exposure to covid-19: Secondary | ICD-10-CM | POA: Insufficient documentation

## 2020-06-23 DIAGNOSIS — Z01812 Encounter for preprocedural laboratory examination: Secondary | ICD-10-CM | POA: Insufficient documentation

## 2020-06-23 HISTORY — DX: Gastro-esophageal reflux disease without esophagitis: K21.9

## 2020-06-23 HISTORY — DX: Other specified postprocedural states: R11.2

## 2020-06-23 HISTORY — DX: Other specified postprocedural states: Z98.890

## 2020-06-23 LAB — CBC
HCT: 46.3 % (ref 39.0–52.0)
Hemoglobin: 14.6 g/dL (ref 13.0–17.0)
MCH: 28.2 pg (ref 26.0–34.0)
MCHC: 31.5 g/dL (ref 30.0–36.0)
MCV: 89.6 fL (ref 80.0–100.0)
Platelets: 265 10*3/uL (ref 150–400)
RBC: 5.17 MIL/uL (ref 4.22–5.81)
RDW: 13.9 % (ref 11.5–15.5)
WBC: 9.4 10*3/uL (ref 4.0–10.5)
nRBC: 0 % (ref 0.0–0.2)

## 2020-06-23 LAB — URINALYSIS, ROUTINE W REFLEX MICROSCOPIC
Bilirubin Urine: NEGATIVE
Glucose, UA: NEGATIVE mg/dL
Hgb urine dipstick: NEGATIVE
Ketones, ur: 20 mg/dL — AB
Nitrite: NEGATIVE
Protein, ur: NEGATIVE mg/dL
Specific Gravity, Urine: 1.023 (ref 1.005–1.030)
pH: 5 (ref 5.0–8.0)

## 2020-06-23 LAB — TYPE AND SCREEN
ABO/RH(D): A POS
Antibody Screen: NEGATIVE

## 2020-06-23 LAB — SURGICAL PCR SCREEN
MRSA, PCR: POSITIVE — AB
Staphylococcus aureus: POSITIVE — AB

## 2020-06-23 LAB — HEMOGLOBIN A1C
Hgb A1c MFr Bld: 5.5 % (ref 4.8–5.6)
Mean Plasma Glucose: 111.15 mg/dL

## 2020-06-23 LAB — SARS CORONAVIRUS 2 (TAT 6-24 HRS): SARS Coronavirus 2: NEGATIVE

## 2020-06-23 NOTE — Progress Notes (Addendum)
Unable to obtain ABG on preadmission appt, had CRNA attempt as well.  PT,  PTT, and CMP grossly hemolyzed, orders to collect re-entered for day of surgery.  Staff note sent to notify Dr. Tyrone Sage of leukocytes in urine and issues with above labs.   1500 Spoke to patient on phone and rescheduled him to come back in on Friday 06/24/20 at 0830 for labs to be redrawn.  Also got instruction on his Aspirin and relayed to him to take his Aspirin through the day before surgery.

## 2020-06-23 NOTE — Progress Notes (Addendum)
PCP - Dr. Donette Larry with [facility] in [place] Cardiologist - Dr. Jacinto Halim in [place]  Chest x-ray - 06/23/20 EKG - 06/23/20 Stress Test -  June 2021 ECHO - 02/29/20 Cardiac Cath - 04/19/20  Sleep Study - Yes has OSA CPAP - Nightly  Blood Thinner Instructions: Xarelto last dose 06/22/20 per Dr. Tyrone Sage Aspirin Instructions: Asked patient to call Dr. Dennie Maizes office for instruction on Aspirin  COVID TEST- 06/23/20  Anesthesia review: Yes cardiac history   Patient denies shortness of breath, fever, cough and chest pain at PAT appointment   All instructions explained to the patient, with a verbal understanding of the material. Patient agrees to go over the instructions while at home for a better understanding. Patient also instructed to self quarantine after being tested for COVID-19. The opportunity to ask questions was provided.

## 2020-06-23 NOTE — Telephone Encounter (Signed)
[facility] Attending Physician's Statement completed and faxed to [phone]. Beginning leave with [facility] is 06/27/2020, approx RTW date will be 09/26/2020 Original form mailed to patient's home address.

## 2020-06-24 ENCOUNTER — Ambulatory Visit (HOSPITAL_COMMUNITY)
Admission: RE | Admit: 2020-06-24 | Discharge: 2020-06-24 | Disposition: A | Discharge status: Home / Self Care | Payer: BC Managed Care – PPO | Source: Ambulatory Visit | Attending: Cardiothoracic Surgery | Admitting: Cardiothoracic Surgery

## 2020-06-24 ENCOUNTER — Encounter (HOSPITAL_COMMUNITY)
Admission: RE | Admit: 2020-06-24 | Discharge: 2020-06-24 | Disposition: A | Discharge status: Home / Self Care | Payer: BC Managed Care – PPO | Source: Ambulatory Visit | Attending: Cardiothoracic Surgery | Admitting: Cardiothoracic Surgery

## 2020-06-24 DIAGNOSIS — I2510 Atherosclerotic heart disease of native coronary artery without angina pectoris: Secondary | ICD-10-CM

## 2020-06-24 DIAGNOSIS — Z01812 Encounter for preprocedural laboratory examination: Secondary | ICD-10-CM | POA: Insufficient documentation

## 2020-06-24 LAB — COMPREHENSIVE METABOLIC PANEL
ALT: 33 U/L (ref 0–44)
AST: 23 U/L (ref 15–41)
Albumin: 3.9 g/dL (ref 3.5–5.0)
Alkaline Phosphatase: 74 U/L (ref 38–126)
Anion gap: 8 (ref 5–15)
BUN: 9 mg/dL (ref 6–20)
CO2: 27 mmol/L (ref 22–32)
Calcium: 9.6 mg/dL (ref 8.9–10.3)
Chloride: 105 mmol/L (ref 98–111)
Creatinine, Ser: 0.91 mg/dL (ref 0.61–1.24)
GFR calc Af Amer: >60 mL/min (ref >60)
GFR calc non Af Amer: >60 mL/min (ref >60)
Glucose, Bld: 106 mg/dL — ABNORMAL HIGH (ref 70–99)
Potassium: 3.9 mmol/L (ref 3.5–5.1)
Sodium: 140 mmol/L (ref 135–145)
Total Bilirubin: 1.1 mg/dL (ref 0.3–1.2)
Total Protein: 6.7 g/dL (ref 6.5–8.1)

## 2020-06-24 LAB — PULMONARY FUNCTION TEST
DL/VA % pred: 91 %
DL/VA: 4.05 ml/min/mmHg/L
DLCO cor % pred: 83 %
DLCO cor: 28.3 ml/min/mmHg
DLCO unc % pred: 83 %
DLCO unc: 28.3 ml/min/mmHg
FEF 25-75 Pre: 3.21 L/sec
FEF2575-%Pred-Pre: 79 %
FEV1-%Pred-Pre: 85 %
FEV1-Pre: 3.9 L
FEV1FVC-%Pred-Pre: 97 %
FEV6-%Pred-Pre: 88 %
FEV6-Pre: 5 L
FEV6FVC-%Pred-Pre: 101 %
FVC-%Pred-Pre: 87 %
FVC-Pre: 5.1 L
Pre FEV1/FVC ratio: 76 %
Pre FEV6/FVC Ratio: 98 %
RV % pred: 83 %
RV: 1.82 L
TLC % pred: 89 %
TLC: 6.94 L

## 2020-06-24 LAB — PROTIME-INR
INR: 1.3 — ABNORMAL HIGH (ref 0.8–1.2)
Prothrombin Time: 15.3 seconds — ABNORMAL HIGH (ref 11.4–15.2)

## 2020-06-24 LAB — APTT: aPTT: 37 seconds — ABNORMAL HIGH (ref 24–36)

## 2020-06-25 MED ORDER — PHENYLEPHRINE HCL-NACL 20-0.9 MG/250ML-% IV SOLN
30.0000 ug/min | INTRAVENOUS | Status: DC
  Filled 2020-06-25 (×2): qty 250

## 2020-06-25 MED ORDER — TRANEXAMIC ACID 1000 MG/10ML IV SOLN
1.5000 mg/kg/h | INTRAVENOUS | Status: AC
  Administered 2020-06-27: 1.5 mg/kg/h via INTRAVENOUS
  Filled 2020-06-25 (×3): qty 25

## 2020-06-25 MED ORDER — TRANEXAMIC ACID (OHS) BOLUS VIA INFUSION
15.0000 mg/kg | INTRAVENOUS | Status: AC
  Administered 2020-06-27: 1734 mg via INTRAVENOUS
  Filled 2020-06-25 (×2): qty 2601

## 2020-06-25 MED ORDER — DEXMEDETOMIDINE HCL IN NACL 400 MCG/100ML IV SOLN
0.1000 ug/kg/h | INTRAVENOUS | Status: AC
  Administered 2020-06-27: .7 ug/kg/h via INTRAVENOUS
  Filled 2020-06-25 (×2): qty 100

## 2020-06-25 MED ORDER — EPINEPHRINE HCL 5 MG/250ML IV SOLN IN NS
0.0000 ug/min | INTRAVENOUS | Status: DC
  Filled 2020-06-25 (×2): qty 250

## 2020-06-25 MED ORDER — MAGNESIUM SULFATE 50 % IJ SOLN
40.0000 meq | INTRAMUSCULAR | Status: DC
  Filled 2020-06-25 (×2): qty 9.85

## 2020-06-25 MED ORDER — PLASMA-LYTE 148 IV SOLN
INTRAVENOUS | Status: DC
  Filled 2020-06-25 (×2): qty 2.5

## 2020-06-25 MED ORDER — MILRINONE LACTATE IN DEXTROSE 20-5 MG/100ML-% IV SOLN
0.3000 ug/kg/min | INTRAVENOUS | Status: DC
  Filled 2020-06-25 (×2): qty 100

## 2020-06-25 MED ORDER — POTASSIUM CHLORIDE 2 MEQ/ML IV SOLN
80.0000 meq | INTRAVENOUS | Status: DC
  Filled 2020-06-25 (×2): qty 40

## 2020-06-25 MED ORDER — INSULIN REGULAR(HUMAN) IN NACL 100-0.9 UT/100ML-% IV SOLN
INTRAVENOUS | Status: AC
  Administered 2020-06-27: 2.6 [IU]/h via INTRAVENOUS
  Filled 2020-06-25 (×2): qty 100

## 2020-06-25 MED ORDER — VANCOMYCIN HCL 1500 MG/300ML IV SOLN
1500.0000 mg | INTRAVENOUS | Status: AC
  Administered 2020-06-27: 1500 mg via INTRAVENOUS
  Filled 2020-06-25 (×2): qty 300

## 2020-06-25 MED ORDER — LEVOFLOXACIN IN D5W 500 MG/100ML IV SOLN
500.0000 mg | INTRAVENOUS | Status: AC
  Administered 2020-06-27: 500 mg via INTRAVENOUS
  Filled 2020-06-25 (×2): qty 100

## 2020-06-25 MED ORDER — TRANEXAMIC ACID (OHS) PUMP PRIME SOLUTION
2.0000 mg/kg | INTRAVENOUS | Status: DC
  Filled 2020-06-25 (×2): qty 3.47

## 2020-06-25 MED ORDER — SODIUM CHLORIDE 0.9 % IV SOLN
INTRAVENOUS | Status: DC
  Filled 2020-06-25 (×2): qty 30

## 2020-06-25 MED ORDER — NOREPINEPHRINE 4 MG/250ML-% IV SOLN
0.0000 ug/min | INTRAVENOUS | Status: DC
  Filled 2020-06-25 (×2): qty 250

## 2020-06-25 MED ORDER — NITROGLYCERIN IN D5W 200-5 MCG/ML-% IV SOLN
2.0000 ug/min | INTRAVENOUS | Status: AC
  Administered 2020-06-27: 8 ug/min via INTRAVENOUS
  Filled 2020-06-25 (×2): qty 250

## 2020-06-27 ENCOUNTER — Encounter (HOSPITAL_COMMUNITY): Payer: Self-pay | Admitting: Cardiothoracic Surgery

## 2020-06-27 ENCOUNTER — Ambulatory Visit: Payer: BC Managed Care – PPO | Admitting: Cardiology

## 2020-06-27 ENCOUNTER — Inpatient Hospital Stay (HOSPITAL_COMMUNITY): Payer: BC Managed Care – PPO

## 2020-06-27 ENCOUNTER — Inpatient Hospital Stay (HOSPITAL_COMMUNITY): Payer: BC Managed Care – PPO | Admitting: Certified Registered Nurse Anesthetist

## 2020-06-27 ENCOUNTER — Other Ambulatory Visit: Payer: Self-pay

## 2020-06-27 ENCOUNTER — Encounter (HOSPITAL_COMMUNITY)
Admission: RE | Disposition: A | Discharge status: Home / Self Care | Payer: Self-pay | Source: Home / Self Care | Attending: Cardiothoracic Surgery

## 2020-06-27 ENCOUNTER — Inpatient Hospital Stay (HOSPITAL_COMMUNITY)
Admission: RE | Admit: 2020-06-27 | Discharge: 2020-07-03 | DRG: 236 | Disposition: A | Discharge status: Home Health | Payer: BC Managed Care – PPO | Attending: Cardiothoracic Surgery | Admitting: Cardiothoracic Surgery

## 2020-06-27 DIAGNOSIS — Z880 Allergy status to penicillin: Secondary | ICD-10-CM | POA: Diagnosis not present

## 2020-06-27 DIAGNOSIS — Z8249 Family history of ischemic heart disease and other diseases of the circulatory system: Secondary | ICD-10-CM | POA: Diagnosis not present

## 2020-06-27 DIAGNOSIS — D62 Acute posthemorrhagic anemia: Secondary | ICD-10-CM | POA: Diagnosis not present

## 2020-06-27 DIAGNOSIS — D696 Thrombocytopenia, unspecified: Secondary | ICD-10-CM | POA: Diagnosis not present

## 2020-06-27 DIAGNOSIS — Z6841 Body Mass Index (BMI) 40.0 and over, adult: Secondary | ICD-10-CM | POA: Diagnosis not present

## 2020-06-27 DIAGNOSIS — Z9884 Bariatric surgery status: Secondary | ICD-10-CM

## 2020-06-27 DIAGNOSIS — I10 Essential (primary) hypertension: Secondary | ICD-10-CM | POA: Diagnosis present

## 2020-06-27 DIAGNOSIS — Z87891 Personal history of nicotine dependence: Secondary | ICD-10-CM

## 2020-06-27 DIAGNOSIS — I25119 Atherosclerotic heart disease of native coronary artery with unspecified angina pectoris: Secondary | ICD-10-CM | POA: Diagnosis present

## 2020-06-27 DIAGNOSIS — I4891 Unspecified atrial fibrillation: Secondary | ICD-10-CM | POA: Diagnosis present

## 2020-06-27 DIAGNOSIS — E8770 Fluid overload, unspecified: Secondary | ICD-10-CM | POA: Diagnosis not present

## 2020-06-27 DIAGNOSIS — I480 Paroxysmal atrial fibrillation: Secondary | ICD-10-CM | POA: Diagnosis present

## 2020-06-27 DIAGNOSIS — Z86711 Personal history of pulmonary embolism: Secondary | ICD-10-CM

## 2020-06-27 DIAGNOSIS — I2510 Atherosclerotic heart disease of native coronary artery without angina pectoris: Secondary | ICD-10-CM | POA: Diagnosis not present

## 2020-06-27 DIAGNOSIS — G4733 Obstructive sleep apnea (adult) (pediatric): Secondary | ICD-10-CM | POA: Diagnosis present

## 2020-06-27 DIAGNOSIS — Z951 Presence of aortocoronary bypass graft: Secondary | ICD-10-CM

## 2020-06-27 DIAGNOSIS — K219 Gastro-esophageal reflux disease without esophagitis: Secondary | ICD-10-CM | POA: Diagnosis present

## 2020-06-27 HISTORY — DX: Presence of aortocoronary bypass graft: Z95.1

## 2020-06-27 HISTORY — PX: CLIPPING OF ATRIAL APPENDAGE: SHX5773

## 2020-06-27 HISTORY — PX: CORONARY ARTERY BYPASS GRAFT: SHX141

## 2020-06-27 HISTORY — PX: RADIAL ARTERY HARVEST: SHX5067

## 2020-06-27 HISTORY — PX: TEE WITHOUT CARDIOVERSION: SHX5443

## 2020-06-27 LAB — POCT I-STAT EG7
Acid-base deficit: 3 mmol/L — ABNORMAL HIGH (ref 0.0–2.0)
Bicarbonate: 25.2 mmol/L (ref 20.0–28.0)
Calcium, Ion: 1.24 mmol/L (ref 1.15–1.40)
HCT: 38 % — ABNORMAL LOW (ref 39.0–52.0)
Hemoglobin: 12.9 g/dL — ABNORMAL LOW (ref 13.0–17.0)
O2 Saturation: 85 %
Potassium: 4.1 mmol/L (ref 3.5–5.1)
Sodium: 141 mmol/L (ref 135–145)
TCO2: 27 mmol/L (ref 22–32)
pCO2, Ven: 55.7 mmHg (ref 44.0–60.0)
pH, Ven: 7.264 (ref 7.250–7.430)
pO2, Ven: 58 mmHg — ABNORMAL HIGH (ref 32.0–45.0)

## 2020-06-27 LAB — POCT I-STAT 7, (LYTES, BLD GAS, ICA,H+H)
Acid-Base Excess: 1 mmol/L (ref 0.0–2.0)
Acid-Base Excess: 1 mmol/L (ref 0.0–2.0)
Acid-Base Excess: 1 mmol/L (ref 0.0–2.0)
Acid-base deficit: 2 mmol/L (ref 0.0–2.0)
Acid-base deficit: 1 mmol/L (ref 0.0–2.0)
Acid-base deficit: 2 mmol/L (ref 0.0–2.0)
Bicarbonate: 25.3 mmol/L (ref 20.0–28.0)
Bicarbonate: 27.7 mmol/L (ref 20.0–28.0)
Bicarbonate: 26.3 mmol/L (ref 20.0–28.0)
Bicarbonate: 27.7 mmol/L (ref 20.0–28.0)
Bicarbonate: 24.4 mmol/L (ref 20.0–28.0)
Bicarbonate: 23.2 mmol/L (ref 20.0–28.0)
Calcium, Ion: 1.33 mmol/L (ref 1.15–1.40)
Calcium, Ion: 1.16 mmol/L (ref 1.15–1.40)
Calcium, Ion: 1.18 mmol/L (ref 1.15–1.40)
Calcium, Ion: 1.23 mmol/L (ref 1.15–1.40)
Calcium, Ion: 1.24 mmol/L (ref 1.15–1.40)
Calcium, Ion: 1.26 mmol/L (ref 1.15–1.40)
HCT: 40 % (ref 39.0–52.0)
HCT: 35 % — ABNORMAL LOW (ref 39.0–52.0)
HCT: 33 % — ABNORMAL LOW (ref 39.0–52.0)
HCT: 44 % (ref 39.0–52.0)
HCT: 41 % (ref 39.0–52.0)
HCT: 40 % (ref 39.0–52.0)
Hemoglobin: 13.6 g/dL (ref 13.0–17.0)
Hemoglobin: 11.9 g/dL — ABNORMAL LOW (ref 13.0–17.0)
Hemoglobin: 11.2 g/dL — ABNORMAL LOW (ref 13.0–17.0)
Hemoglobin: 15 g/dL (ref 13.0–17.0)
Hemoglobin: 13.9 g/dL (ref 13.0–17.0)
Hemoglobin: 13.6 g/dL (ref 13.0–17.0)
O2 Saturation: 97 %
O2 Saturation: 100 %
O2 Saturation: 100 %
O2 Saturation: 94 %
O2 Saturation: 96 %
O2 Saturation: 95 %
Patient temperature: 35.9
Patient temperature: 36.5
Patient temperature: 98.6
Potassium: 3.7 mmol/L (ref 3.5–5.1)
Potassium: 4.3 mmol/L (ref 3.5–5.1)
Potassium: 4.5 mmol/L (ref 3.5–5.1)
Potassium: 4.8 mmol/L (ref 3.5–5.1)
Potassium: 4.5 mmol/L (ref 3.5–5.1)
Potassium: 4.4 mmol/L (ref 3.5–5.1)
Sodium: 143 mmol/L (ref 135–145)
Sodium: 141 mmol/L (ref 135–145)
Sodium: 139 mmol/L (ref 135–145)
Sodium: 140 mmol/L (ref 135–145)
Sodium: 139 mmol/L (ref 135–145)
Sodium: 138 mmol/L (ref 135–145)
TCO2: 27 mmol/L (ref 22–32)
TCO2: 29 mmol/L (ref 22–32)
TCO2: 28 mmol/L (ref 22–32)
TCO2: 29 mmol/L (ref 22–32)
TCO2: 26 mmol/L (ref 22–32)
TCO2: 24 mmol/L (ref 22–32)
pCO2 arterial: 49.9 mmHg — ABNORMAL HIGH (ref 32.0–48.0)
pCO2 arterial: 51.8 mmHg — ABNORMAL HIGH (ref 32.0–48.0)
pCO2 arterial: 44.7 mmHg (ref 32.0–48.0)
pCO2 arterial: 47.4 mmHg (ref 32.0–48.0)
pCO2 arterial: 41.6 mmHg (ref 32.0–48.0)
pCO2 arterial: 40.8 mmHg (ref 32.0–48.0)
pH, Arterial: 7.312 — ABNORMAL LOW (ref 7.350–7.450)
pH, Arterial: 7.337 — ABNORMAL LOW (ref 7.350–7.450)
pH, Arterial: 7.377 (ref 7.350–7.450)
pH, Arterial: 7.37 (ref 7.350–7.450)
pH, Arterial: 7.375 (ref 7.350–7.450)
pH, Arterial: 7.362 (ref 7.350–7.450)
pO2, Arterial: 101 mmHg (ref 83.0–108.0)
pO2, Arterial: 380 mmHg — ABNORMAL HIGH (ref 83.0–108.0)
pO2, Arterial: 309 mmHg — ABNORMAL HIGH (ref 83.0–108.0)
pO2, Arterial: 70 mmHg — ABNORMAL LOW (ref 83.0–108.0)
pO2, Arterial: 83 mmHg (ref 83.0–108.0)
pO2, Arterial: 78 mmHg — ABNORMAL LOW (ref 83.0–108.0)

## 2020-06-27 LAB — POCT I-STAT, CHEM 8
BUN: 9 mg/dL (ref 6–20)
BUN: 10 mg/dL (ref 6–20)
BUN: 8 mg/dL (ref 6–20)
BUN: 9 mg/dL (ref 6–20)
BUN: 9 mg/dL (ref 6–20)
Calcium, Ion: 1.35 mmol/L (ref 1.15–1.40)
Calcium, Ion: 1.3 mmol/L (ref 1.15–1.40)
Calcium, Ion: 1.19 mmol/L (ref 1.15–1.40)
Calcium, Ion: 1.21 mmol/L (ref 1.15–1.40)
Calcium, Ion: 1.27 mmol/L (ref 1.15–1.40)
Chloride: 105 mmol/L (ref 98–111)
Chloride: 106 mmol/L (ref 98–111)
Chloride: 104 mmol/L (ref 98–111)
Chloride: 104 mmol/L (ref 98–111)
Chloride: 105 mmol/L (ref 98–111)
Creatinine, Ser: 0.6 mg/dL — ABNORMAL LOW (ref 0.61–1.24)
Creatinine, Ser: 0.6 mg/dL — ABNORMAL LOW (ref 0.61–1.24)
Creatinine, Ser: 0.6 mg/dL — ABNORMAL LOW (ref 0.61–1.24)
Creatinine, Ser: 0.5 mg/dL — ABNORMAL LOW (ref 0.61–1.24)
Creatinine, Ser: 0.6 mg/dL — ABNORMAL LOW (ref 0.61–1.24)
Glucose, Bld: 104 mg/dL — ABNORMAL HIGH (ref 70–99)
Glucose, Bld: 123 mg/dL — ABNORMAL HIGH (ref 70–99)
Glucose, Bld: 166 mg/dL — ABNORMAL HIGH (ref 70–99)
Glucose, Bld: 188 mg/dL — ABNORMAL HIGH (ref 70–99)
Glucose, Bld: 188 mg/dL — ABNORMAL HIGH (ref 70–99)
HCT: 39 % (ref 39.0–52.0)
HCT: 38 % — ABNORMAL LOW (ref 39.0–52.0)
HCT: 32 % — ABNORMAL LOW (ref 39.0–52.0)
HCT: 34 % — ABNORMAL LOW (ref 39.0–52.0)
HCT: 34 % — ABNORMAL LOW (ref 39.0–52.0)
Hemoglobin: 13.3 g/dL (ref 13.0–17.0)
Hemoglobin: 12.9 g/dL — ABNORMAL LOW (ref 13.0–17.0)
Hemoglobin: 10.9 g/dL — ABNORMAL LOW (ref 13.0–17.0)
Hemoglobin: 11.6 g/dL — ABNORMAL LOW (ref 13.0–17.0)
Hemoglobin: 11.6 g/dL — ABNORMAL LOW (ref 13.0–17.0)
Potassium: 3.7 mmol/L (ref 3.5–5.1)
Potassium: 4.4 mmol/L (ref 3.5–5.1)
Potassium: 4.6 mmol/L (ref 3.5–5.1)
Potassium: 4.2 mmol/L (ref 3.5–5.1)
Potassium: 4.4 mmol/L (ref 3.5–5.1)
Sodium: 142 mmol/L (ref 135–145)
Sodium: 141 mmol/L (ref 135–145)
Sodium: 138 mmol/L (ref 135–145)
Sodium: 140 mmol/L (ref 135–145)
Sodium: 141 mmol/L (ref 135–145)
TCO2: 24 mmol/L (ref 22–32)
TCO2: 28 mmol/L (ref 22–32)
TCO2: 25 mmol/L (ref 22–32)
TCO2: 24 mmol/L (ref 22–32)
TCO2: 25 mmol/L (ref 22–32)

## 2020-06-27 LAB — CBC
HCT: 43.5 % (ref 39.0–52.0)
HCT: 40.4 % (ref 39.0–52.0)
Hemoglobin: 13.9 g/dL (ref 13.0–17.0)
Hemoglobin: 13 g/dL (ref 13.0–17.0)
MCH: 28.4 pg (ref 26.0–34.0)
MCH: 28.5 pg (ref 26.0–34.0)
MCHC: 32 g/dL (ref 30.0–36.0)
MCHC: 32.2 g/dL (ref 30.0–36.0)
MCV: 88.8 fL (ref 80.0–100.0)
MCV: 88.6 fL (ref 80.0–100.0)
Platelets: 229 10*3/uL (ref 150–400)
Platelets: 187 10*3/uL (ref 150–400)
RBC: 4.9 MIL/uL (ref 4.22–5.81)
RBC: 4.56 MIL/uL (ref 4.22–5.81)
RDW: 14.2 % (ref 11.5–15.5)
RDW: 13.6 % (ref 11.5–15.5)
WBC: 23.2 10*3/uL — ABNORMAL HIGH (ref 4.0–10.5)
WBC: 16.2 10*3/uL — ABNORMAL HIGH (ref 4.0–10.5)
nRBC: 0 % (ref 0.0–0.2)
nRBC: 0 % (ref 0.0–0.2)

## 2020-06-27 LAB — BASIC METABOLIC PANEL
Anion gap: 8 (ref 5–15)
BUN: 8 mg/dL (ref 6–20)
CO2: 25 mmol/L (ref 22–32)
Calcium: 8.8 mg/dL — ABNORMAL LOW (ref 8.9–10.3)
Chloride: 103 mmol/L (ref 98–111)
Creatinine, Ser: 0.71 mg/dL (ref 0.61–1.24)
GFR calc Af Amer: >60 mL/min (ref >60)
GFR calc non Af Amer: >60 mL/min (ref >60)
Glucose, Bld: 136 mg/dL — ABNORMAL HIGH (ref 70–99)
Potassium: 4.4 mmol/L (ref 3.5–5.1)
Sodium: 136 mmol/L (ref 135–145)

## 2020-06-27 LAB — HEMOGLOBIN AND HEMATOCRIT, BLOOD
HCT: 37.3 % — ABNORMAL LOW (ref 39.0–52.0)
Hemoglobin: 11.9 g/dL — ABNORMAL LOW (ref 13.0–17.0)

## 2020-06-27 LAB — PLATELET COUNT: Platelets: 167 10*3/uL (ref 150–400)

## 2020-06-27 LAB — GLUCOSE, CAPILLARY
Glucose-Capillary: 157 mg/dL — ABNORMAL HIGH (ref 70–99)
Glucose-Capillary: 142 mg/dL — ABNORMAL HIGH (ref 70–99)
Glucose-Capillary: 140 mg/dL — ABNORMAL HIGH (ref 70–99)
Glucose-Capillary: 130 mg/dL — ABNORMAL HIGH (ref 70–99)
Glucose-Capillary: 135 mg/dL — ABNORMAL HIGH (ref 70–99)
Glucose-Capillary: 132 mg/dL — ABNORMAL HIGH (ref 70–99)
Glucose-Capillary: 139 mg/dL — ABNORMAL HIGH (ref 70–99)

## 2020-06-27 LAB — APTT
aPTT: 34 seconds (ref 24–36)
aPTT: 36 seconds (ref 24–36)

## 2020-06-27 LAB — PROTIME-INR
INR: 1.3 — ABNORMAL HIGH (ref 0.8–1.2)
INR: 1.2 (ref 0.8–1.2)
Prothrombin Time: 15.7 seconds — ABNORMAL HIGH (ref 11.4–15.2)
Prothrombin Time: 15.2 seconds (ref 11.4–15.2)

## 2020-06-27 LAB — ECHO INTRAOPERATIVE TEE
Height: 74 in
Weight: 6160 oz

## 2020-06-27 LAB — MAGNESIUM: Magnesium: 2.9 mg/dL — ABNORMAL HIGH (ref 1.7–2.4)

## 2020-06-27 SURGERY — CORONARY ARTERY BYPASS GRAFTING (CABG)
Anesthesia: General | Site: Chest

## 2020-06-27 MED ORDER — MIDAZOLAM HCL (PF) 10 MG/2ML IJ SOLN
INTRAMUSCULAR | Status: AC
  Filled 2020-06-27: qty 2

## 2020-06-27 MED ORDER — PANTOPRAZOLE SODIUM 40 MG PO TBEC: 40.0000 mg | DELAYED_RELEASE_TABLET | Freq: Every day | ORAL | Status: DC

## 2020-06-27 MED ORDER — SODIUM CHLORIDE 0.9% FLUSH: 3.0000 mL | Freq: Two times a day (BID) | INTRAVENOUS | Status: DC

## 2020-06-27 MED ORDER — SODIUM CHLORIDE 0.9% FLUSH: 10.0000 mL | INTRAVENOUS | Status: DC | PRN

## 2020-06-27 MED ORDER — OXYCODONE HCL 5 MG PO TABS
5.0000 mg | ORAL_TABLET | ORAL | Status: DC | PRN
  Administered 2020-06-28: 10 mg via ORAL
  Administered 2020-06-28: 5 mg via ORAL
  Filled 2020-06-27: qty 1
  Filled 2020-06-27: qty 2

## 2020-06-27 MED ORDER — TRAMADOL HCL 50 MG PO TABS
50.0000 mg | ORAL_TABLET | ORAL | Status: DC | PRN
  Administered 2020-06-28 (×2): 100 mg via ORAL
  Administered 2020-06-29: 50 mg via ORAL
  Filled 2020-06-27: qty 1
  Filled 2020-06-27 (×2): qty 2

## 2020-06-27 MED ORDER — VECURONIUM BROMIDE 10 MG IV SOLR
INTRAVENOUS | Status: AC
  Filled 2020-06-27: qty 10

## 2020-06-27 MED ORDER — ALBUMIN HUMAN 5 % IV SOLN: INTRAVENOUS | Status: DC | PRN

## 2020-06-27 MED ORDER — SODIUM CHLORIDE 0.9 % IV SOLN: INTRAVENOUS | Status: DC

## 2020-06-27 MED ORDER — ASPIRIN 81 MG PO CHEW: 324.0000 mg | CHEWABLE_TABLET | Freq: Every day | ORAL | Status: DC

## 2020-06-27 MED ORDER — CHLORHEXIDINE GLUCONATE 0.12 % MT SOLN
OROMUCOSAL | Status: AC
  Administered 2020-06-27: 15 mL via OROMUCOSAL
  Filled 2020-06-27: qty 15

## 2020-06-27 MED ORDER — MIDAZOLAM HCL 2 MG/2ML IJ SOLN
2.0000 mg | INTRAMUSCULAR | Status: DC | PRN
  Administered 2020-06-27: 2 mg via INTRAVENOUS
  Filled 2020-06-27: qty 2

## 2020-06-27 MED ORDER — BISACODYL 5 MG PO TBEC
10.0000 mg | DELAYED_RELEASE_TABLET | Freq: Every day | ORAL | Status: DC
  Administered 2020-06-28 – 2020-06-29 (×2): 10 mg via ORAL
  Filled 2020-06-27 (×3): qty 2

## 2020-06-27 MED ORDER — ACETAMINOPHEN 500 MG PO TABS
1000.0000 mg | ORAL_TABLET | Freq: Four times a day (QID) | ORAL | Status: AC
  Administered 2020-06-28 – 2020-07-02 (×14): 1000 mg via ORAL
  Filled 2020-06-27 (×17): qty 2

## 2020-06-27 MED ORDER — METOPROLOL TARTRATE 12.5 MG HALF TABLET: 12.5000 mg | ORAL_TABLET | Freq: Two times a day (BID) | ORAL | Status: DC

## 2020-06-27 MED ORDER — ORAL CARE MOUTH RINSE
15.0000 mL | Freq: Two times a day (BID) | OROMUCOSAL | Status: DC
  Administered 2020-06-27 – 2020-07-02 (×10): 15 mL via OROMUCOSAL

## 2020-06-27 MED ORDER — PROTAMINE SULFATE 10 MG/ML IV SOLN
INTRAVENOUS | Status: DC | PRN
  Administered 2020-06-27: 500 mg via INTRAVENOUS

## 2020-06-27 MED ORDER — AMIODARONE HCL 200 MG PO TABS
200.0000 mg | ORAL_TABLET | Freq: Every day | ORAL | Status: DC
  Administered 2020-06-28 – 2020-06-29 (×2): 200 mg via ORAL
  Filled 2020-06-27 (×2): qty 1

## 2020-06-27 MED ORDER — MAGNESIUM SULFATE 4 GM/100ML IV SOLN
4.0000 g | Freq: Once | INTRAVENOUS | Status: AC
  Administered 2020-06-27: 4 g via INTRAVENOUS
  Filled 2020-06-27: qty 100

## 2020-06-27 MED ORDER — ROCURONIUM BROMIDE 10 MG/ML (PF) SYRINGE
PREFILLED_SYRINGE | INTRAVENOUS | Status: AC
  Filled 2020-06-27: qty 10

## 2020-06-27 MED ORDER — LACTATED RINGERS IV SOLN: INTRAVENOUS | Status: DC | PRN

## 2020-06-27 MED ORDER — METOPROLOL TARTRATE 12.5 MG HALF TABLET
ORAL_TABLET | ORAL | Status: AC
  Filled 2020-06-27: qty 1

## 2020-06-27 MED ORDER — SODIUM CHLORIDE 0.9 % IV SOLN: INTRAVENOUS | Status: DC | PRN

## 2020-06-27 MED ORDER — PLASMA-LYTE 148 IV SOLN
INTRAVENOUS | Status: DC | PRN
  Administered 2020-06-27: 500 mL via INTRAVASCULAR

## 2020-06-27 MED ORDER — FENTANYL CITRATE (PF) 250 MCG/5ML IJ SOLN
INTRAMUSCULAR | Status: AC
  Filled 2020-06-27: qty 30

## 2020-06-27 MED ORDER — ROCURONIUM BROMIDE 10 MG/ML (PF) SYRINGE
PREFILLED_SYRINGE | INTRAVENOUS | Status: DC | PRN
  Administered 2020-06-27 (×2): 20 mg via INTRAVENOUS
  Administered 2020-06-27: 80 mg via INTRAVENOUS
  Administered 2020-06-27 (×4): 50 mg via INTRAVENOUS

## 2020-06-27 MED ORDER — DOPAMINE-DEXTROSE 3.2-5 MG/ML-% IV SOLN: 3.0000 ug/kg/min | INTRAVENOUS | Status: DC

## 2020-06-27 MED ORDER — PROPOFOL 10 MG/ML IV BOLUS
INTRAVENOUS | Status: DC | PRN
  Administered 2020-06-27 (×2): 100 mg via INTRAVENOUS

## 2020-06-27 MED ORDER — HEMOSTATIC AGENTS (NO CHARGE) OPTIME
TOPICAL | Status: DC | PRN
  Administered 2020-06-27 (×2): 1 via TOPICAL

## 2020-06-27 MED ORDER — DOPAMINE-DEXTROSE 1.6-5 MG/ML-% IV SOLN: INTRAVENOUS | Status: DC | PRN

## 2020-06-27 MED ORDER — POTASSIUM CHLORIDE 10 MEQ/50ML IV SOLN: 10.0000 meq | INTRAVENOUS | Status: AC

## 2020-06-27 MED ORDER — HEPARIN SODIUM (PORCINE) 1000 UNIT/ML IJ SOLN
INTRAMUSCULAR | Status: AC
  Filled 2020-06-27: qty 2

## 2020-06-27 MED ORDER — ALBUMIN HUMAN 5 % IV SOLN
250.0000 mL | INTRAVENOUS | Status: AC | PRN
  Administered 2020-06-27 – 2020-06-28 (×4): 12.5 g via INTRAVENOUS
  Filled 2020-06-27 (×2): qty 250

## 2020-06-27 MED ORDER — CHLORHEXIDINE GLUCONATE CLOTH 2 % EX PADS
6.0000 | MEDICATED_PAD | Freq: Every day | CUTANEOUS | Status: DC
  Administered 2020-06-27: 6 via TOPICAL

## 2020-06-27 MED ORDER — PROPOFOL 10 MG/ML IV BOLUS
INTRAVENOUS | Status: AC
  Filled 2020-06-27: qty 20

## 2020-06-27 MED ORDER — SODIUM CHLORIDE 0.9 % IV SOLN: 250.0000 mL | INTRAVENOUS | Status: DC

## 2020-06-27 MED ORDER — MIDAZOLAM HCL 5 MG/5ML IJ SOLN
INTRAMUSCULAR | Status: DC | PRN
  Administered 2020-06-27: 1 mg via INTRAVENOUS
  Administered 2020-06-27: 2 mg via INTRAVENOUS
  Administered 2020-06-27 (×2): 1 mg via INTRAVENOUS
  Administered 2020-06-27: 3 mg via INTRAVENOUS
  Administered 2020-06-27: 1 mg via INTRAVENOUS
  Administered 2020-06-27: 2 mg via INTRAVENOUS
  Administered 2020-06-27: 1 mg via INTRAVENOUS

## 2020-06-27 MED ORDER — VASOPRESSIN 20 UNIT/ML IV SOLN
INTRAVENOUS | Status: AC
  Filled 2020-06-27: qty 1

## 2020-06-27 MED ORDER — METOPROLOL TARTRATE 12.5 MG HALF TABLET: 12.5000 mg | ORAL_TABLET | Freq: Once | ORAL | Status: DC

## 2020-06-27 MED ORDER — ACETAMINOPHEN 160 MG/5ML PO SOLN: 1000.0000 mg | Freq: Four times a day (QID) | ORAL | Status: AC

## 2020-06-27 MED ORDER — METOCLOPRAMIDE HCL 5 MG/ML IJ SOLN
10.0000 mg | Freq: Four times a day (QID) | INTRAMUSCULAR | Status: AC
  Administered 2020-06-27 – 2020-06-28 (×3): 10 mg via INTRAVENOUS
  Filled 2020-06-27 (×3): qty 2

## 2020-06-27 MED ORDER — VANCOMYCIN HCL IN DEXTROSE 1-5 GM/200ML-% IV SOLN
1000.0000 mg | Freq: Once | INTRAVENOUS | Status: AC
  Administered 2020-06-27: 1000 mg via INTRAVENOUS
  Filled 2020-06-27: qty 200

## 2020-06-27 MED ORDER — PANTOPRAZOLE SODIUM 40 MG PO TBEC
40.0000 mg | DELAYED_RELEASE_TABLET | Freq: Every day | ORAL | Status: DC
  Administered 2020-06-28 – 2020-07-02 (×5): 40 mg via ORAL
  Filled 2020-06-27 (×6): qty 1

## 2020-06-27 MED ORDER — DEXMEDETOMIDINE HCL IN NACL 400 MCG/100ML IV SOLN
0.0000 ug/kg/h | INTRAVENOUS | Status: DC
  Administered 2020-06-27 (×2): 0.7 ug/kg/h via INTRAVENOUS
  Administered 2020-06-27: 0.2 ug/kg/h via INTRAVENOUS
  Filled 2020-06-27 (×2): qty 100

## 2020-06-27 MED ORDER — ACETAMINOPHEN 650 MG RE SUPP
650.0000 mg | Freq: Once | RECTAL | Status: AC
  Administered 2020-06-27: 650 mg via RECTAL

## 2020-06-27 MED ORDER — FAMOTIDINE IN NACL 20-0.9 MG/50ML-% IV SOLN
20.0000 mg | Freq: Two times a day (BID) | INTRAVENOUS | Status: DC
  Administered 2020-06-27: 20 mg via INTRAVENOUS

## 2020-06-27 MED ORDER — SODIUM CHLORIDE (PF) 0.9 % IJ SOLN
OROMUCOSAL | Status: DC | PRN
  Administered 2020-06-27 (×3): 4 mL via TOPICAL

## 2020-06-27 MED ORDER — LACTATED RINGERS IV SOLN: INTRAVENOUS | Status: DC

## 2020-06-27 MED ORDER — ACETAMINOPHEN 160 MG/5ML PO SOLN: 650.0000 mg | Freq: Once | ORAL | Status: AC

## 2020-06-27 MED ORDER — FENTANYL CITRATE (PF) 250 MCG/5ML IJ SOLN
INTRAMUSCULAR | Status: DC | PRN
Start: 2020-06-27 — End: 2020-06-27
  Administered 2020-06-27 (×2): 25 ug via INTRAVENOUS
  Administered 2020-06-27 (×2): 100 ug via INTRAVENOUS
  Administered 2020-06-27: 250 ug via INTRAVENOUS
  Administered 2020-06-27: 100 ug via INTRAVENOUS
  Administered 2020-06-27: 25 ug via INTRAVENOUS
  Administered 2020-06-27: 100 ug via INTRAVENOUS
  Administered 2020-06-27: 150 ug via INTRAVENOUS
  Administered 2020-06-27: 100 ug via INTRAVENOUS
  Administered 2020-06-27: 25 ug via INTRAVENOUS
  Administered 2020-06-27: 250 ug via INTRAVENOUS
  Administered 2020-06-27: 150 ug via INTRAVENOUS
  Administered 2020-06-27: 100 ug via INTRAVENOUS

## 2020-06-27 MED ORDER — ATORVASTATIN CALCIUM 80 MG PO TABS
80.0000 mg | ORAL_TABLET | Freq: Every day | ORAL | Status: DC
  Administered 2020-06-28 – 2020-07-03 (×6): 80 mg via ORAL
  Filled 2020-06-27 (×7): qty 1

## 2020-06-27 MED ORDER — DOCUSATE SODIUM 100 MG PO CAPS
200.0000 mg | ORAL_CAPSULE | Freq: Every day | ORAL | Status: DC
  Administered 2020-06-28 – 2020-06-29 (×2): 200 mg via ORAL
  Filled 2020-06-27 (×4): qty 2

## 2020-06-27 MED ORDER — SODIUM CHLORIDE 0.45 % IV SOLN: INTRAVENOUS | Status: DC | PRN

## 2020-06-27 MED ORDER — LACTATED RINGERS IV SOLN: 500.0000 mL | Freq: Once | INTRAVENOUS | Status: DC | PRN

## 2020-06-27 MED ORDER — METOPROLOL TARTRATE 5 MG/5ML IV SOLN
2.5000 mg | INTRAVENOUS | Status: DC | PRN
  Administered 2020-06-29: 2.5 mg via INTRAVENOUS
  Administered 2020-06-29 – 2020-06-30 (×3): 5 mg via INTRAVENOUS
  Filled 2020-06-27 (×4): qty 5

## 2020-06-27 MED ORDER — SODIUM CHLORIDE 0.9% FLUSH: 3.0000 mL | INTRAVENOUS | Status: DC | PRN

## 2020-06-27 MED ORDER — ROCURONIUM BROMIDE 100 MG/10ML IV SOLN: INTRAVENOUS | Status: DC | PRN

## 2020-06-27 MED ORDER — CHLORHEXIDINE GLUCONATE 0.12 % MT SOLN
15.0000 mL | OROMUCOSAL | Status: AC
  Administered 2020-06-27: 15 mL via OROMUCOSAL
  Filled 2020-06-27: qty 15

## 2020-06-27 MED ORDER — ASPIRIN EC 325 MG PO TBEC
325.0000 mg | DELAYED_RELEASE_TABLET | Freq: Every day | ORAL | Status: DC
  Administered 2020-06-28 – 2020-06-29 (×2): 325 mg via ORAL
  Filled 2020-06-27 (×3): qty 1

## 2020-06-27 MED ORDER — SODIUM CHLORIDE 0.9 % IV SOLN: 1.5000 g | Freq: Two times a day (BID) | INTRAVENOUS | Status: DC

## 2020-06-27 MED ORDER — LIDOCAINE 2% (20 MG/ML) 5 ML SYRINGE
INTRAMUSCULAR | Status: AC
  Filled 2020-06-27: qty 5

## 2020-06-27 MED ORDER — BISACODYL 10 MG RE SUPP: 10.0000 mg | Freq: Every day | RECTAL | Status: DC

## 2020-06-27 MED ORDER — INSULIN REGULAR(HUMAN) IN NACL 100-0.9 UT/100ML-% IV SOLN: INTRAVENOUS | Status: DC

## 2020-06-27 MED ORDER — SODIUM CHLORIDE 0.9% FLUSH
10.0000 mL | Freq: Two times a day (BID) | INTRAVENOUS | Status: DC
  Administered 2020-06-27: 20 mL
  Administered 2020-06-28 – 2020-07-02 (×8): 10 mL

## 2020-06-27 MED ORDER — MUPIROCIN 2 % EX OINT
TOPICAL_OINTMENT | Freq: Two times a day (BID) | CUTANEOUS | Status: DC
  Filled 2020-06-27 (×2): qty 22

## 2020-06-27 MED ORDER — DOPAMINE-DEXTROSE 3.2-5 MG/ML-% IV SOLN
3.0000 ug/kg/min | INTRAVENOUS | Status: DC
  Administered 2020-06-27: 10 ug/kg/min via INTRAVENOUS

## 2020-06-27 MED ORDER — PROTAMINE SULFATE 10 MG/ML IV SOLN
INTRAVENOUS | Status: AC
  Filled 2020-06-27: qty 50

## 2020-06-27 MED ORDER — CHLORHEXIDINE GLUCONATE CLOTH 2 % EX PADS
6.0000 | MEDICATED_PAD | Freq: Every day | CUTANEOUS | Status: DC
  Administered 2020-06-28 – 2020-07-02 (×5): 6 via TOPICAL

## 2020-06-27 MED ORDER — PHENYLEPHRINE 40 MCG/ML (10ML) SYRINGE FOR IV PUSH (FOR BLOOD PRESSURE SUPPORT)
PREFILLED_SYRINGE | INTRAVENOUS | Status: AC
  Filled 2020-06-27: qty 10

## 2020-06-27 MED ORDER — PHENYLEPHRINE HCL-NACL 20-0.9 MG/250ML-% IV SOLN: 0.0000 ug/min | INTRAVENOUS | Status: DC

## 2020-06-27 MED ORDER — GLYCOPYRROLATE PF 0.2 MG/ML IJ SOSY
PREFILLED_SYRINGE | INTRAMUSCULAR | Status: AC
  Filled 2020-06-27: qty 1

## 2020-06-27 MED ORDER — 0.9 % SODIUM CHLORIDE (POUR BTL) OPTIME
TOPICAL | Status: DC | PRN
  Administered 2020-06-27: 6000 mL

## 2020-06-27 MED ORDER — EPHEDRINE 5 MG/ML INJ
INTRAVENOUS | Status: AC
  Filled 2020-06-27: qty 10

## 2020-06-27 MED ORDER — MIDAZOLAM HCL 2 MG/2ML IJ SOLN
INTRAMUSCULAR | Status: AC
  Filled 2020-06-27: qty 2

## 2020-06-27 MED ORDER — HEPARIN SODIUM (PORCINE) 1000 UNIT/ML IJ SOLN
INTRAMUSCULAR | Status: DC | PRN
  Administered 2020-06-27: 60000 [IU] via INTRAVENOUS

## 2020-06-27 MED ORDER — MORPHINE SULFATE (PF) 2 MG/ML IV SOLN
1.0000 mg | INTRAVENOUS | Status: DC | PRN
  Administered 2020-06-27 – 2020-06-28 (×6): 2 mg via INTRAVENOUS
  Administered 2020-06-29: 4 mg via INTRAVENOUS
  Filled 2020-06-27: qty 1
  Filled 2020-06-27: qty 2
  Filled 2020-06-27 (×6): qty 1

## 2020-06-27 MED ORDER — METOPROLOL TARTRATE 25 MG/10 ML ORAL SUSPENSION: 12.5000 mg | Freq: Two times a day (BID) | ORAL | Status: DC

## 2020-06-27 MED ORDER — NITROGLYCERIN IN D5W 200-5 MCG/ML-% IV SOLN: 7.0000 ug/min | INTRAVENOUS | Status: DC

## 2020-06-27 MED ORDER — ROCURONIUM BROMIDE 10 MG/ML (PF) SYRINGE
PREFILLED_SYRINGE | INTRAVENOUS | Status: AC
  Filled 2020-06-27: qty 20

## 2020-06-27 MED ORDER — DEXTROSE 50 % IV SOLN: 0.0000 mL | INTRAVENOUS | Status: DC | PRN

## 2020-06-27 MED ORDER — DOPAMINE-DEXTROSE 3.2-5 MG/ML-% IV SOLN
INTRAVENOUS | Status: DC | PRN
  Administered 2020-06-27: 3 ug/kg/min via INTRAVENOUS

## 2020-06-27 MED ORDER — CHLORHEXIDINE GLUCONATE 0.12 % MT SOLN: 15.0000 mL | Freq: Once | OROMUCOSAL | Status: AC

## 2020-06-27 MED ORDER — CHLORHEXIDINE GLUCONATE 4 % EX LIQD: 30.0000 mL | CUTANEOUS | Status: DC

## 2020-06-27 MED ORDER — ONDANSETRON HCL 4 MG/2ML IJ SOLN
4.0000 mg | Freq: Four times a day (QID) | INTRAMUSCULAR | Status: DC | PRN
  Administered 2020-06-29: 4 mg via INTRAVENOUS
  Filled 2020-06-27: qty 2

## 2020-06-27 SURGICAL SUPPLY — 109 items
AGENT HMST KT MTR STRL THRMB (HEMOSTASIS) ×2
APL SKNCLS STERI-STRIP NONHPOA (GAUZE/BANDAGES/DRESSINGS) ×8
APPLIER CLIP 9.375 SM OPEN (CLIP)
APR CLP SM 9.3 20 MLT OPN (CLIP)
ATRICLIP EXCLUSION VLAA SYSTEM (Miscellaneous) ×4 IMPLANT
BAG DECANTER FOR FLEXI CONT (MISCELLANEOUS) ×4 IMPLANT
BENZOIN TINCTURE PRP APPL 2/3 (GAUZE/BANDAGES/DRESSINGS) ×16 IMPLANT
BLADE CLIPPER SURG (BLADE) ×4 IMPLANT
BLADE STERNUM SYSTEM 6 (BLADE) ×4 IMPLANT
BLADE SURG 15 STRL LF DISP TIS (BLADE) ×3 IMPLANT
BLADE SURG 15 STRL SS (BLADE) ×4
BNDG ELASTIC 4X5.8 VLCR STR LF (GAUZE/BANDAGES/DRESSINGS) IMPLANT
BNDG ELASTIC 6X5.8 VLCR STR LF (GAUZE/BANDAGES/DRESSINGS) ×4 IMPLANT
BNDG GAUZE ELAST 4 BULKY (GAUZE/BANDAGES/DRESSINGS) ×4 IMPLANT
CANISTER SUCT 3000ML PPV (MISCELLANEOUS) ×4 IMPLANT
CANISTER WOUNDNEG PRESSURE 500 (CANNISTER) ×4 IMPLANT
CANNULA ARTERIAL NVNT 3/8 22FR (MISCELLANEOUS) ×4 IMPLANT
CARDIOBLATE CARDIAC ABLATION (MISCELLANEOUS)
CATH CPB KIT GERHARDT (MISCELLANEOUS) ×4 IMPLANT
CATH THORACIC 28FR (CATHETERS) ×4 IMPLANT
CLIP APPLIE 9.375 SM OPEN (CLIP) IMPLANT
CLIP VESOCCLUDE MED 24/CT (CLIP) ×4 IMPLANT
CLIP VESOCCLUDE SM WIDE 24/CT (CLIP) ×4 IMPLANT
COVER MAYO STAND STRL (DRAPES) IMPLANT
CUFF TOURN SGL QUICK 18X4 (TOURNIQUET CUFF) IMPLANT
CUFF TOURN SGL QUICK 24 (TOURNIQUET CUFF)
CUFF TRNQT CYL 24X4X16.5-23 (TOURNIQUET CUFF) IMPLANT
DEVICE CARDIOBLATE CARDIAC ABL (MISCELLANEOUS) IMPLANT
DRAIN CHANNEL 28F RND 3/8 FF (WOUND CARE) ×4 IMPLANT
DRAPE CARDIOVASCULAR INCISE (DRAPES) ×4
DRAPE EXTREMITY T 121X128X90 (DISPOSABLE) ×4 IMPLANT
DRAPE HALF SHEET 40X57 (DRAPES) ×4 IMPLANT
DRAPE SLUSH/WARMER DISC (DRAPES) ×4 IMPLANT
DRAPE SRG 135X102X78XABS (DRAPES) ×3 IMPLANT
DRSG AQUACEL AG ADV 3.5X14 (GAUZE/BANDAGES/DRESSINGS) ×4 IMPLANT
ELECT BLADE 4.0 EZ CLEAN MEGAD (MISCELLANEOUS) ×8
ELECT REM PT RETURN 9FT ADLT (ELECTROSURGICAL) ×8
ELECTRODE BLDE 4.0 EZ CLN MEGD (MISCELLANEOUS) ×6 IMPLANT
ELECTRODE REM PT RTRN 9FT ADLT (ELECTROSURGICAL) ×6 IMPLANT
FELT TEFLON 1X6 (MISCELLANEOUS) ×8 IMPLANT
FILTER SMOKE EVAC ULPA (FILTER) ×4 IMPLANT
GAUZE SPONGE 4X4 12PLY STRL (GAUZE/BANDAGES/DRESSINGS) ×4 IMPLANT
GEL ULTRASOUND 20GR AQUASONIC (MISCELLANEOUS) ×4 IMPLANT
GLOVE BIO SURGEON STRL SZ 6.5 (GLOVE) ×60 IMPLANT
GLOVE BIOGEL PI IND STRL 6.5 (GLOVE) ×6 IMPLANT
GLOVE BIOGEL PI INDICATOR 6.5 (GLOVE) ×2
GOWN STRL REUS W/ TWL LRG LVL3 (GOWN DISPOSABLE) ×30 IMPLANT
GOWN STRL REUS W/TWL LRG LVL3 (GOWN DISPOSABLE) ×40
HEMOSTAT POWDER SURGIFOAM 1G (HEMOSTASIS) ×12 IMPLANT
HEMOSTAT SURGICEL 2X14 (HEMOSTASIS) ×4 IMPLANT
KIT BASIN OR (CUSTOM PROCEDURE TRAY) ×4 IMPLANT
KIT CATH SUCT 8FR (CATHETERS) ×4 IMPLANT
KIT DRSG PREVENA PLUS 7DAY 125 (MISCELLANEOUS) ×4 IMPLANT
KIT PREVENA INCISION MGT20CM45 (CANNISTER) ×8 IMPLANT
KIT SUCTION CATH 14FR (SUCTIONS) ×12 IMPLANT
KIT TURNOVER KIT B (KITS) ×4 IMPLANT
KIT VASOVIEW HEMOPRO 2 VH 4000 (KITS) ×4 IMPLANT
LEAD PACING MYOCARDI (MISCELLANEOUS) ×4 IMPLANT
MARKER GRAFT CORONARY BYPASS (MISCELLANEOUS) ×12 IMPLANT
NEEDLE 18GX1X1/2 (RX/OR ONLY) (NEEDLE) ×4 IMPLANT
NS IRRIG 1000ML POUR BTL (IV SOLUTION) ×24 IMPLANT
PACK E OPEN HEART (SUTURE) ×4 IMPLANT
PACK OPEN HEART (CUSTOM PROCEDURE TRAY) ×4 IMPLANT
PAD ARMBOARD 7.5X6 YLW CONV (MISCELLANEOUS) ×8 IMPLANT
PAD ELECT DEFIB RADIOL ZOLL (MISCELLANEOUS) ×4 IMPLANT
PENCIL BUTTON HOLSTER BLD 10FT (ELECTRODE) ×4 IMPLANT
PENCIL SMOKE EVACUATOR (MISCELLANEOUS) ×4 IMPLANT
POSITIONER HEAD DONUT 9IN (MISCELLANEOUS) ×4 IMPLANT
POWDER SURGICEL 3.0 GRAM (HEMOSTASIS) ×4 IMPLANT
PROBE CRYO2-ABLATION MALLABLE (MISCELLANEOUS) IMPLANT
PUNCH AORTIC ROTATE 4.0MM (MISCELLANEOUS) ×4 IMPLANT
SET CARDIOPLEGIA MPS 5001102 (MISCELLANEOUS) ×4 IMPLANT
SHEARS HARMONIC 9CM CVD (BLADE) ×8 IMPLANT
SLEEVE SUCTION 125 (MISCELLANEOUS) ×4 IMPLANT
SPONGE LAP 18X18 RF (DISPOSABLE) ×8 IMPLANT
SPONGE LAP 18X18 X RAY DECT (DISPOSABLE) ×16 IMPLANT
STAPLER VISISTAT 35W (STAPLE) ×8 IMPLANT
SURGIFLO W/THROMBIN 8M KIT (HEMOSTASIS) ×4 IMPLANT
SUT BONE WAX W31G (SUTURE) ×4 IMPLANT
SUT PROLENE 3 0 SH1 36 (SUTURE) ×8 IMPLANT
SUT PROLENE 4 0 TF (SUTURE) ×8 IMPLANT
SUT PROLENE 6 0 C 1 30 (SUTURE) ×4 IMPLANT
SUT PROLENE 6 0 CC (SUTURE) ×8 IMPLANT
SUT PROLENE 7 0 BV1 MDA (SUTURE) ×4 IMPLANT
SUT PROLENE 7.0 RB 3 (SUTURE) ×20 IMPLANT
SUT PROLENE 8 0 BV175 6 (SUTURE) ×12 IMPLANT
SUT SILK 2 0 SH CR/8 (SUTURE) ×4 IMPLANT
SUT STEEL 6MS V (SUTURE) IMPLANT
SUT STEEL SZ 6 DBL 3X14 BALL (SUTURE) ×12 IMPLANT
SUT VIC AB 1 CTX 18 (SUTURE) ×12 IMPLANT
SUT VIC AB 2-0 CT1 27 (SUTURE)
SUT VIC AB 2-0 CT1 TAPERPNT 27 (SUTURE) IMPLANT
SUT VIC AB 3-0 SH 27 (SUTURE)
SUT VIC AB 3-0 SH 27X BRD (SUTURE) IMPLANT
SUT VIC AB 3-0 SH 8-18 (SUTURE) ×8 IMPLANT
SUT VIC AB 3-0 X1 27 (SUTURE) IMPLANT
SYR 50ML SLIP (SYRINGE) IMPLANT
SYSTEM EXCLUSION ATRICLIP VLAA (Miscellaneous) ×3 IMPLANT
SYSTEM SAHARA CHEST DRAIN ATS (WOUND CARE) ×4 IMPLANT
TAPE CLOTH SURG 4X10 WHT LF (GAUZE/BANDAGES/DRESSINGS) ×4 IMPLANT
TAPE PAPER 2X10 WHT MICROPORE (GAUZE/BANDAGES/DRESSINGS) ×4 IMPLANT
TOWEL GREEN STERILE (TOWEL DISPOSABLE) ×4 IMPLANT
TOWEL GREEN STERILE FF (TOWEL DISPOSABLE) ×4 IMPLANT
TRAP FLUID SMOKE EVACUATOR (MISCELLANEOUS) ×4 IMPLANT
TRAY FOLEY SLVR 14FR TEMP STAT (SET/KITS/TRAYS/PACK) ×4 IMPLANT
TRAY FOLEY SLVR 16FR TEMP STAT (SET/KITS/TRAYS/PACK) IMPLANT
TUBING LAP HI FLOW INSUFFLATIO (TUBING) IMPLANT
UNDERPAD 30X36 HEAVY ABSORB (UNDERPADS AND DIAPERS) ×4 IMPLANT
WATER STERILE IRR 1000ML POUR (IV SOLUTION) ×8 IMPLANT

## 2020-06-27 NOTE — Progress Notes (Signed)
[facility] BRIEF SICU PROGRESS NOTE  Day of Surgery  S/P Procedure(s) (LRB): CORONARY ARTERY BYPASS GRAFTING (CABG) X TWO, USING LEFT INTERNAL MAMMARY ARTERY AND LEFT ARM RADIAL ARTERY (N/A) LEFT RADIAL ARTERY HARVEST (Left) CLIPPING OF ATRIAL APPENDAGE USING ATRICURE CLIP SIZE 40 (N/A) TRANSESOPHAGEAL ECHOCARDIOGRAM (TEE) (N/A)   Extubated uneventfully Sable hemodynamics on low dose dopamine O2 sats 96-97% on 4 L/min Chest tube output low UOP adequate  Plan: Continue routine early postop  Purcell Nails, MD 06/27/2020 10:59 PM

## 2020-06-27 NOTE — H&P (Signed)
[address]       [place] [address]             [phone]                    Joseph Lang [facility] Medical Record #[id number] Date of Birth: 11/24/1975  Referring: Yates Decamp, MD Primary Care: Georgann Housekeeper, MD Primary Cardiologist: No primary care provider on file.  Chief Complaint:        Chief Complaint  Patient presents with  . Coronary Artery Disease     History of Present Illness:    Joseph Lang 44 y.o. male is seen in the office for evaluation of possible coronary artery bypass grafting.   Since first  seen he has made an effort with diet and weight control -he has dropped his weight 25 pounds .  He has had no recurrent shortness of breath chest pain or episodes of atrial fibrillation.   The patient underwent recent cardiac catheterization by Dr. Jacinto Halim and was referred following this.  The patient notes that he began having increasing symptoms, Easter 2021 when he developed atrial fibrillation, he was cardioverted in the emergency room and discharged home.  Troponins were noted to be mildly elevated high sensitive troponin at 153.  Follow-up stress test was performed by Dr. Jacinto Halim leading to cardiac catheterization.  At the time of catheterization the patient presented in sinus rhythm but during the cath procedure went back into atrial fib.  Last week he underwent cardioversion and has remained in sinus rhythm since.   Patient has a history of obstructive sleep apnea.  He had a sleeve gastrectomy for weight loss by Dr. Daphine Deutscher 5 years ago.  Several months following surgery developed multiple pulmonary emboli and was started on anticoagulation.  In Feb 28, 2019 he had DVT and is now on Xarelto for anticoagulation even prior to the onset of atrial fibrillation.  His family history is strong for both atrial fib and coronary artery disease, both his mother and father have had bypass surgery.  His father died in 27-Feb-2018 during carotid artery procedure. Patient  smokes 1 pack of cigarettes since a day since age 59, he has a high intake of caffeine and soft drinks.  Patient has had a long-term problem with his excessive weight, notes initially after gastric sleeve resection he reduced his weight weight down to just over 300 pounds.  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  Patient is in the midst of completing his second Covid vaccination which will be next week  Current Activity/ Functional Status:  Patient is independent with mobility/ambulation, transfers, ADL's, IADL's.   Zubrod Score: At the time of surgery this patient's most appropriate activity status/level should be described as: []     0    Normal activity, no symptoms [x]     1    Restricted in physical strenuous activity but ambulatory, able to do out light work []     2    Ambulatory and capable of self care, unable to do work activities, up and about               >50 % of waking hours                              []     3  Only limited self care, in bed greater than 50% of waking hours []     4    Completely disabled, no self care, confined to bed or chair []     5    Moribund   Past Medical History:  Diagnosis Date  . Coronary artery disease 2021  . Dysrhythmia 2021   Afib  . GERD (gastroesophageal reflux disease)   . Hx of deep vein thrombophlebitis of lower extremity   . Hx of gastric bypass   . Hx of pulmonary embolus   . Hypertension   . Obesity   . PONV (postoperative nausea and vomiting)    Throwing up after wt loss surgery  . Sleep apnea   . Tobacco abuse     Past Surgical History:  Procedure Laterality Date  . CARDIAC CATHETERIZATION Left 2021  . CARDIOVERSION    . CARDIOVERSION N/A 04/19/2020   Procedure: CARDIOVERSION;  Surgeon: 2022, MD;  Location: [facility] ENDOSCOPY;  Service: Cardiovascular;  Laterality: N/A;  . CARDIOVERSION N/A 04/26/2020   Procedure:  CARDIOVERSION;  Surgeon: [facility], MD;  Location: [facility] ENDOSCOPY;  Service: Cardiovascular;  Laterality: N/A;  . FRACTURE SURGERY  1995   pelvis  . HIP SURGERY    . LAPAROSCOPIC GASTRIC SLEEVE RESECTION N/A 02/14/2015   Procedure: LAPAROSCOPIC GASTRIC SLEEVE RESECTION WITH UPPER ENDOSCOPY;  Surgeon: [facility], MD;  Location: [facility] ORS;  Service: General;  Laterality: N/A;  . LEFT HEART CATH AND CORONARY ANGIOGRAPHY N/A 04/19/2020   Procedure: LEFT HEART CATH AND CORONARY ANGIOGRAPHY;  Surgeon: Luretha Murphy, MD;  Location: [facility] INVASIVE CV LAB;  Service: Cardiovascular;  Laterality: N/A;  . PELVIS CLOSED REDUCTION      Family History  Problem Relation Age of Onset  . Atrial fibrillation Mother   . CAD Mother   . Heart disease Mother   . CAD Father   . Atrial fibrillation Father      Social History   Tobacco Use  Smoking Status Former Smoker  . Packs/day: 0.50  . Years: 28.00  . Pack years: 14.00  . Types: Cigarettes  . Quit date: 04/20/2020  . Years since quitting: 0.1  Smokeless Tobacco Never Used  Tobacco Comment   half a pack daily    Social History   Substance and Sexual Activity  Alcohol Use Not Currently     Allergies  Allergen Reactions  . Amoxicillin Nausea And Vomiting    Current Facility-Administered Medications  Medication Dose Route Frequency Provider Last Rate Last Admin  . chlorhexidine (HIBICLENS) 4 % liquid 2 application  30 mL Topical UD Yates Decamp, MD      . dexmedetomidine (PRECEDEX) 400 MCG/100ML (4 mcg/mL) infusion  0.1-0.7 mcg/kg/hr Intravenous To OR 06/21/2020, MD      . EPINEPHrine (ADRENALIN) 4 mg in NS 250 mL (0.016 mg/mL) premix infusion  0-10 mcg/min Intravenous To OR Delight Ovens, MD      . heparin 30,000 units/NS 1000 mL solution for CELLSAVER   Other To OR Delight Ovens, MD      . heparin sodium (porcine) 2,500 Units, papaverine 30 mg in electrolyte-148 (PLASMALYTE-148) 500 mL irrigation   Irrigation To OR Delight Ovens, MD      . insulin regular, human (MYXREDLIN) 100 units/ 100 mL infusion   Intravenous To OR Delight Ovens, MD      . levofloxacin (LEVAQUIN) IVPB 500 mg  500 mg Intravenous To OR Delight Ovens, MD      .  magnesium sulfate (IV Push/IM) injection 40 mEq  40 mEq Other To OR Delight Ovens, MD      . metoprolol tartrate (LOPRESSOR) tablet 12.5 mg  12.5 mg Oral Once Delight Ovens, MD      . milrinone (PRIMACOR) 20 MG/100 ML (0.2 mg/mL) infusion  0.3 mcg/kg/min Intravenous To OR Delight Ovens, MD      . nitroGLYCERIN 50 mg in dextrose 5 % 250 mL (0.2 mg/mL) infusion  2-200 mcg/min Intravenous To OR Delight Ovens, MD      . norepinephrine (LEVOPHED)  in premix infusion  0-40 mcg/min Intravenous To OR Delight Ovens, MD      . phenylephrine (NEOSYNEPHRINE) 20-0.9 MG/250ML-% infusion  30-200 mcg/min Intravenous To OR Delight Ovens, MD      . potassium chloride injection 80 mEq  80 mEq Other To OR Delight Ovens, MD      . tranexamic acid (CYKLOKAPRON) 2,500 mg in sodium chloride 0.9 % 250 mL (10 mg/mL) infusion  1.5 mg/kg/hr Intravenous To OR Delight Ovens, MD      . tranexamic acid (CYKLOKAPRON) bolus via infusion - over 30 minutes 2,601 mg  15 mg/kg Intravenous To OR Delight Ovens, MD      . tranexamic acid (CYKLOKAPRON) pump prime solution 347 mg  2 mg/kg Intracatheter To OR Delight Ovens, MD      . vancomycin (VANCOREADY) IVPB 1500 mg/300 mL  1,500 mg Intravenous To OR Delight Ovens, MD       Facility-Administered Medications Ordered in Other Encounters  Medication Dose Route Frequency Provider Last Rate Last Admin  . fentaNYL citrate (PF) (SUBLIMAZE) injection   Intravenous Anesthesia Intra-op Demetrio Lapping, CRNA   25 mcg at 06/27/20 0705  . midazolam (VERSED) 5 MG/5ML injection   Intravenous Anesthesia Intra-op Demetrio Lapping, CRNA   1 mg at 06/27/20 4098    Pertinent items are noted in HPI.   Review of  Systems:     Cardiac Review of Systems: [Y] = yes  or   [ N ] = no   Chest Pain Klaus.Mock  ]  Resting SOB [ N] Exertional SOB  [ Y ]  Orthopnea [ n ]   Pedal Edema Klaus.Mock  ]    Palpitations [Y] Syncope  Klaus.Mock  ]   Presyncope Klaus.Mock   ]   General Review of Systems: [Y] = yes [  ]=no Constitional: recent weight change [  ];  Wt loss over the last 3 months [n   ] anorexia [  ]; fatigue [  ]; nausea [  ]; night sweats [  ]; fever [  ]; or chills [  ];           Eye : blurred vision [  ]; diplopia [   ]; vision changes [  ];  Amaurosis fugax[  ]; Resp: cough [  ];  wheezing[  ];  hemoptysis[  ]; shortness of breath[  ]; paroxysmal nocturnal dyspnea[  ]; dyspnea on exertion[  ]; or orthopnea[  ];  GI:  gallstones[  ], vomiting[  ];  dysphagia[  ]; melena[  ];  hematochezia [  ]; heartburn[  ];   Hx of  Colonoscopy[  ]; GU: kidney stones [  ]; hematuria[  ];   dysuria [  ];  nocturia[  ];  history of     obstruction [  ]; urinary frequency [  ]  Skin: rash, swelling[  ];, hair loss[  ];  peripheral edema[  ];  or itching[  ]; Musculosketetal: myalgias[  ];  joint swelling[  ];  joint erythema[  ];  joint pain[  ];  back pain[  ];  Heme/Lymph: bruising[  ];  bleeding[  ];  anemia[  ];  Neuro: TIA[  ];  headaches[  ];  stroke[  ];  vertigo[  ];  seizures[  ];   paresthesias[  ];  difficulty walking[  ];  Psych:depression[  ]; anxiety[  ];  Endocrine: diabetes[  ];  thyroid dysfunction[  ];  Immunizations: Flu up to date [n  ]; Pneumococcal up to date [n  ]; COVID vacination-  Completed last month  Other:     PHYSICAL EXAMINATION: BP (!) 151/67   Pulse (!) 52   Temp 97.9 F (36.6 C) (Temporal)   Resp 18   Ht 6\' 2"  (1.88 m)   Wt (!) 174.6 kg   SpO2 96%   BMI 49.43 kg/m  General appearance: alert, cooperative, appears stated age and no distress Head: Normocephalic, without obvious abnormality, atraumatic Neck: no adenopathy, no carotid bruit, no JVD, supple, symmetrical, trachea midline and thyroid  not enlarged, symmetric, no tenderness/mass/nodules Lymph nodes: Cervical, supraclavicular, and axillary nodes normal. Resp: clear to auscultation bilaterally Back: symmetric, no curvature. ROM normal. No CVA tenderness. Cardio: regular rate and rhythm, S1, S2 normal, no murmur, click, rub or gallop GI: soft, non-tender; bowel sounds normal; no masses,  no organomegaly Extremities: edema Patient has bilateral ankle edema, no ulcers, gangrene or trophic changes, varicose veins noted and venous stasis dermatitis noted Neurologic: Grossly normal Has palpable DP and PT pulses bilaterally-size of his legs make evaluation veins difficult The patient is left-handed- Modified allen Test was normal (postive) bilateral  Diagnostic Studies & Laboratory data:     Recent Radiology Findings:   Left Heart Catheterization 04/19/20:  LV: Moderately elevated LVEDP.  LM: Mid 40%. Hazy. Severe diffuse multivessel disease. Ectasia of the LAD, RCA. Prox LAD aneurysmal. Occluded after origin of a very large D1. D1 is very large with mid 90% stenosis. Ipsilateral and contralateral (RCA) to mid LAD noted.  Cx: Prox 70-80% stenosis involves the bifurcation of large OM-1. At the bifurcation there is at least a 80% stenosis. RCA: Diffusely ectatic. PL branch has a focal 50% stenosis.  Slow flow noted in the RCA.   Rec: Complex multivessel CAD and will benefit from CABG to LAD and D1 and OM1 with arterial conduits. Maze and LAA ligation. Patient went into atrial fibrillation in the cath lab. 70 mL contrast used. IV dilt and metoprolol given but did not convert to sinus. He will resume Xarelto tonight.  Patient wants to come in and discuss personally at the office. Not happy about CABG potential.  I have independently reviewed the above radiology studies  and reviewed the findings with the patient.   Recent Lab Findings: Lab Results  Component Value Date   WBC 9.4 06/23/2020   HGB 14.6 06/23/2020   HCT 46.3  06/23/2020   PLT 265 06/23/2020   GLUCOSE 106 (H) 06/24/2020   ALT 33 06/24/2020   AST 23 06/24/2020   NA 140 06/24/2020   K 3.9 06/24/2020   CL 105 06/24/2020   CREATININE 0.91 06/24/2020   BUN 9 06/24/2020   CO2 27 06/24/2020   TSH 1.564 01/17/2020   INR 1.3 (H) 06/24/2020   HGBA1C 5.5 06/23/2020   ECHO: 1. Poor echo window.  Off axis views and measurements may be inaccurate. No parasternal views. Normal LV systolic function with visual EF 55-60%. Left ventricle cavity is mildly dilated. Mild concentric hypertrophy of the left ventricle. Normal global wall motion. Normal diastolic filling pattern, normal LAP. 2. Left atrial cavity is mildly dilated grossly. 3. Structurally normal mitral valve. Mild (Grade I) mitral regurgitation. 4. Trace tricuspid regurgitation. Inadequate TR jet to estimate pulmonary artery systolic pressure. 5. IVC is dilated with poor inspiration collapse consistent with elevated right atrial pressure. Conclusions: Yates Decamp, MD [facility] Feb 29, 2020 09:50 PM EDT Electronically Signed on Studycast Joseph  Assessment / Plan:   #1 recent onset of intermittent atrial fib-2 episodes documented 1 during cardiac catheterization currently holding sinus rhythm-with consideration for coronary artery bypass grafting the patient could have a concomitant pulmonary vein isolation and placement of atrial clip. #2 significant coronary artery disease with total occlusion of the LAD faint collateral filling, greater than 80% stenosis of a large diagonal supplying collaterals to the LAD, 70 to 80% obtuse marginal disease.  The right coronary artery is large diffusely ectatic but with no high-grade stenosis. #3 history of DVT and pulmonary emboli in the past currently on chronic anticoagulation primarily for history of PE and DVT-but now also because of his atrial fib #4 morbid obesity with BMI of 53  I discussed with the patient and his mother in detail the risks and options  for coronary artery bypass graft.   With his history of 2 short episodes of atrial fibrillation consider pulmonary vein isolation and placement of atrial clip.  The risks and options and expectations bypass surgery were discussed with the patient.   Patient completed full vaccination covid     The goals risks and alternatives of the planned surgical procedure Procedure(s): CORONARY ARTERY BYPASS GRAFTING (CABG) (N/A) possible RADIAL ARTERY HARVEST (Left) MAZE (N/A) CLIPPING OF ATRIAL APPENDAGE (N/A) TRANSESOPHAGEAL ECHOCARDIOGRAM (TEE) (N/A)  have been discussed with the patient in detail. The risks of the procedure including death, infection, stroke, myocardial infarction, bleeding, blood transfusion have all been discussed specifically.  I have quoted Joseph Lang a 3 % of perioperative mortality and a complication rate as high as 40 %. The patient's questions have been answered.Joseph Lang is willing  to proceed with the planned procedure.  Delight Ovens MD      [address] [place] [place] [address] Office [phone]     06/27/2020 7:15 AM

## 2020-06-27 NOTE — Anesthesia Postprocedure Evaluation (Signed)
Anesthesia Post Note  Patient: Joseph Lang  Procedure(s) Performed: CORONARY ARTERY BYPASS GRAFTING (CABG) X TWO, USING LEFT INTERNAL MAMMARY ARTERY AND LEFT ARM RADIAL ARTERY (N/A Chest) LEFT RADIAL ARTERY HARVEST (Left ) CLIPPING OF ATRIAL APPENDAGE USING ATRICURE CLIP SIZE 40 (N/A Chest) TRANSESOPHAGEAL ECHOCARDIOGRAM (TEE) (N/A )     Patient location during evaluation: SICU Anesthesia Type: General Level of consciousness: sedated Pain management: pain level controlled Vital Signs Assessment: post-procedure vital signs reviewed and stable Respiratory status: patient remains intubated per anesthesia plan Cardiovascular status: stable Postop Assessment: no apparent nausea or vomiting Anesthetic complications: no   No complications documented.  Last Vitals:  Vitals:   06/27/20 1440 06/27/20 1447  BP:  113/64  Pulse:  90  Resp:  14  Temp:  (!) 35.9 C  SpO2: 96% 96%    Last Pain:  Vitals:   06/27/20 0618  TempSrc: Temporal  PainSc:                  Shelton Silvas

## 2020-06-27 NOTE — Progress Notes (Signed)
Placed patient on CPAP via FFM, auto settings (max 20.0, min 5.0)with 5lpm O2 bleed in. Patient tolerating well at this time. RN aware.

## 2020-06-27 NOTE — Transfer of Care (Signed)
Immediate Anesthesia Transfer of Care Note  Patient: Joseph Lang  Procedure(s) Performed: CORONARY ARTERY BYPASS GRAFTING (CABG) X TWO, USING LEFT INTERNAL MAMMARY ARTERY AND LEFT ARM RADIAL ARTERY (N/A Chest) LEFT RADIAL ARTERY HARVEST (Left ) CLIPPING OF ATRIAL APPENDAGE USING ATRICURE CLIP SIZE 40 (N/A Chest) TRANSESOPHAGEAL ECHOCARDIOGRAM (TEE) (N/A )  Patient Location: SICU  Anesthesia Type:General  Level of Consciousness: sedated and Patient remains intubated per anesthesia plan  Airway & Oxygen Therapy: Patient remains intubated per anesthesia plan and Patient placed on Ventilator (see vital sign flow sheet for setting)  Post-op Assessment: Report given to RN and Post -op Vital signs reviewed and stable  Post vital signs: Reviewed and stable  Last Vitals:  Vitals Value Taken Time  BP 113/64 06/27/20 1447  Temp 35.8 C 06/27/20 1500  Pulse 88 06/27/20 1500  Resp 26 06/27/20 1500  SpO2 95 % 06/27/20 1500  Vitals shown include unvalidated device data.  Last Pain:  Vitals:   06/27/20 0618  TempSrc: Temporal  PainSc:       Patients Stated Pain Goal: 3 (06/27/20 6948)  Complications: No complications documented.

## 2020-06-27 NOTE — Procedures (Signed)
Extubation Procedure Note  Patient Details:   Name: [place] E Liotta DOB: 1976/09/22 MRN: [id number]   Airway Documentation:    Vent end date: 06/27/20 Vent end time: 1850   Evaluation  O2 sats: stable throughout Complications: No apparent complications Patient did tolerate procedure well. Bilateral Breath Sounds: Clear, Diminished   Yes   Patient extubated per protocol to 4L [place] with no apparent complications. Positive cuff leak was noted prior to extubation. Patient achieved NIF of -28 and VC of 1.5L with great patient effort. Patient is alert and oriented and is able to speak. Vitals are stable. RT will continue to monitor.   Caitlinn Klinker Lajuana Ripple 06/27/2020, 6:54 PM

## 2020-06-27 NOTE — Anesthesia Preprocedure Evaluation (Addendum)
Anesthesia Evaluation  Patient identified by MRN, date of birth, ID band Patient awake    Reviewed: Allergy & Precautions, NPO status , Patient's Chart, lab work & pertinent test results, reviewed documented beta blocker date and time   History of Anesthesia Complications (+) PONV and history of anesthetic complications  Airway Mallampati: I  TM Distance: <3 FB Neck ROM: Full    Dental  (+) Teeth Intact, Dental Advisory Given   Pulmonary sleep apnea , former smoker,    breath sounds clear to auscultation       Cardiovascular hypertension, Pt. on medications and Pt. on home beta blockers + CAD  + dysrhythmias Atrial Fibrillation  Rhythm:Regular Rate:Normal     Neuro/Psych negative neurological ROS  negative psych ROS   GI/Hepatic Neg liver ROS, GERD  ,  Endo/Other  negative endocrine ROS  Renal/GU negative Renal ROS     Musculoskeletal negative musculoskeletal ROS (+)   Abdominal (+) + obese,   Peds  Hematology negative hematology ROS (+)   Anesthesia Other Findings   Reproductive/Obstetrics                            Anesthesia Physical Anesthesia Plan  ASA: IV  Anesthesia Plan: General   Post-op Pain Management:    Induction: Intravenous  PONV Risk Score and Plan: 4 or greater and Ondansetron, Treatment may vary due to age or medical condition and Midazolam  Airway Management Planned: Oral ETT  Additional Equipment: Arterial line, CVP, PA Cath, TEE and Ultrasound Guidance Line Placement  Intra-op Plan:   Post-operative Plan: Post-operative intubation/ventilation  Informed Consent: I have reviewed the patients History and Physical, chart, labs and discussed the procedure including the risks, benefits and alternatives for the proposed anesthesia with the patient or authorized representative who has indicated his/her understanding and acceptance.     Dental advisory  given  Plan Discussed with: CRNA  Anesthesia Plan Comments: (Lab Results      Component                Value               Date                      WBC                      9.4                 06/23/2020                HGB                      14.6                06/23/2020                HCT                      46.3                06/23/2020                MCV                      89.6                06/23/2020  PLT                      265                 06/23/2020            Lab Results      Component                Value               Date                      CREATININE               0.91                06/24/2020                BUN                      9                   06/24/2020                NA                       140                 06/24/2020                K                        3.9                 06/24/2020                CL                       105                 06/24/2020                CO2                      27                  06/24/2020            EKG: sinus bradycardia.  Echo: Normal LV systolic function with visual EF 55-60%.  Left ventricle cavity is mildly dilated. Mild concentric hypertrophy of  the left ventricle. Normal global wall motion. Normal diastolic filling  pattern, normal LAP.  Left atrial cavity is mildly dilated grossly.  Structurally normal mitral valve. Mild (Grade I) mitral regurgitation.  Trace tricuspid regurgitation. Inadequate TR jet to estimate pulmonary  artery systolic pressure.  IVC is dilated with poor inspiration collapse consistent with elevated  right atrial pressure. )       Anesthesia Quick Evaluation

## 2020-06-27 NOTE — Anesthesia Procedure Notes (Signed)
Central Venous Catheter Insertion Performed by: Shelton Silvas, MD, anesthesiologist Start/End9/13/2021 7:20 AM, 06/27/2020 7:25 AM Patient location: Pre-op. Preanesthetic checklist: patient identified, IV checked, site marked, risks and benefits discussed, surgical consent, monitors and equipment checked, pre-op evaluation, timeout performed and anesthesia consent Hand hygiene performed  and maximum sterile barriers used  PA cath was placed.Swan type:thermodilution Procedure performed without using ultrasound guided technique. Attempts: 1 Patient tolerated the procedure well with no immediate complications.

## 2020-06-27 NOTE — Anesthesia Procedure Notes (Addendum)
Procedure Name: Intubation Performed by: Demetrio Lapping, CRNA Pre-anesthesia Checklist: Patient identified, Emergency Drugs available, Suction available and Patient being monitored Patient Re-evaluated:Patient Re-evaluated prior to induction Oxygen Delivery Method: Circle system utilized Preoxygenation: Pre-oxygenation with 100% oxygen Induction Type: IV induction Ventilation: Mask ventilation without difficulty Laryngoscope Size: Miller and 3 Grade View: Grade I Tube type: Oral Tube size: 8.0 mm Number of attempts: 1 Airway Equipment and Method: Stylet and Oral airway Placement Confirmation: ETT inserted through vocal cords under direct vision,  positive ETCO2 and breath sounds checked- equal and bilateral Tube secured with: Tape Dental Injury: Teeth and Oropharynx as per pre-operative assessment  Comments: Performed by Brien Few SRNA

## 2020-06-27 NOTE — Hospital Course (Addendum)
History of Present Illness:  Mr. Borner is a 44 yo morbidly obese male with history of OSA, sleeve gastrectomy, H/O PE on anticoagulation, Paroxysmal Atrial Fibrillation S/P Cardioversion 01/2020, HTN, and nicotine abuse with use since age 64.  He presented with complaints of palpitations in April of this year.  Workup at the time included a stress test which was abnormal.  Due to this is was felt the patient should undergo cardiac catheterization.  This was performed on 04/19/2020 and showed multivessel CAD.  It was felt he would best be treated with coronary bypass grafting.  The patient wished to discuss this on an outpatient basis.  He was referred to [facility] and evaluated by Dr. Tyrone Sage at which time he was in agreement the patient would benefit from coronary bypass grafting.  The patient wished for some time to stop smoking and attempted weight loss prior to proceeding. He was also encouraged to get a COVID vaccine prior to surgery.  He was agreeable to this and would follow up with Dr. Tyrone Sage in 4 weeks.  The patient had successfully quit smoking since July.  He had also lost about 25 lbs.  The risks and benefits of the procedure were explained to the patient and he was agreeable to proceed.     Hospital Course:   Mr. Baskett presented to [facility] on 06/27/2020.  He was taken to the operating room and underwent CABG x 2 utilizing LIMA to LAD and Left radial artery to Diagonal.  He also underwent open harvest of his left radial artery.  He also underwent clipping of his LA appendage with a 40 mm Atricure V Clip.  He tolerated the procedure without difficulty and was taken to the SICU in stable condition.  The patient was weaned and extubated the evening of surgery.  During his stay in the SICU the patients Dopamine, NTG, and Neo-synephrine were discontinued on POD #1.  The patient was started on Imdur for his radial artery graft.  His chest tubes and arterial lines were removed without difficulty.   His left radial artery site has a pravena wound vac in place.  He denies weakness and parasthesias of his hand or thumb.  He was placed on Lovenox for his long standing history of PE/DVT.  He will be transitioned back to his home regimen of Xarelto prior to discharge.  He was in NSR with PVCs, sometimes bigeminy.  His EPW were removed without difficulty.  He was medically stable for transfer to 4E on 06/29/2020.  The patient developed PAF overnight.  He was treated with IV Lopressor which would provide temporary relief.  He was given an IV bolus of Amiodarone which ***.  His cardiologist was consulted and recommended starting the patient on Digoxin therapy, his home Cardizem was started at a reduced dose.  It was also felt the patient should undergo cardioversion.  This was performed on 07/01/2020.  This was ***.

## 2020-06-27 NOTE — Anesthesia Procedure Notes (Addendum)
Central Venous Catheter Insertion Performed by: Effie Berkshire, MD, anesthesiologist Start/End9/13/2021 7:10 AM, 06/27/2020 7:20 AM Patient location: Pre-op. Preanesthetic checklist: patient identified, IV checked, site marked, risks and benefits discussed, surgical consent, monitors and equipment checked, pre-op evaluation, timeout performed and anesthesia consent Position: Trendelenburg Lidocaine 1% used for infiltration and patient sedated Hand hygiene performed , maximum sterile barriers used  and Seldinger technique used Catheter size: 9 Fr Total catheter length 10. Central line was placed.MAC introducer Swan type:thermodilution PA Cath depth:50 Procedure performed using ultrasound guided technique. Ultrasound Notes:anatomy identified, needle tip was noted to be adjacent to the nerve/plexus identified, no ultrasound evidence of intravascular and/or intraneural injection and image(s) printed for medical record Attempts: 1 Following insertion, line sutured, dressing applied and Biopatch. Post procedure assessment: blood return through all ports, free fluid flow and no air  Patient tolerated the procedure well with no immediate complications.

## 2020-06-27 NOTE — Brief Op Note (Signed)
      [address]       [name] [address]             [phone]      06/27/2020  2:35 PM  PATIENT:  Joseph Lang  44 y.o. male  PRE-OPERATIVE DIAGNOSIS:  Coronary Artery Disease/ Paroxysmal   AF   POST-OPERATIVE DIAGNOSIS:  same  PROCEDURE:  Procedure(s):  CORONARY ARTERY BYPASS GRAFTING x 2 -LIMA to LAD -L RADIAL ARTERY to DIAGONAL  OPEN LEFT RADIAL ARTERY HARVEST -Placement of Pravena Wound Vac (35 min/63min)  CLIPPING OF ATRIAL APPENDAGE  -40 mm Atricure V Clip  TRANSESOPHAGEAL ECHOCARDIOGRAM (TEE) (N/A)  SURGEON:  Surgeon(s) and Role:    * Delight Ovens, MD - Primary  PHYSICIAN ASSISTANT: Erin Barrett PA-C  ANESTHESIA:   general  EBL:  1442 mL   BLOOD ADMINISTERED: CELLSAVER  DRAINS: Left Pleural Chest Tube, Mediastinal Chest Drain   LOCAL MEDICATIONS USED:  NONE  SPECIMEN:  No Specimen  DISPOSITION OF SPECIMEN:  N/A  COUNTS:  YES   DICTATION: .Dragon Dictation  PLAN OF CARE: Admit to inpatient   PATIENT DISPOSITION:  ICU - intubated and hemodynamically stable.   Delay start of Pharmacological VTE agent (>24hrs) due to surgical blood loss or risk of bleeding: yes

## 2020-06-27 NOTE — Anesthesia Procedure Notes (Addendum)
Arterial Line Insertion Start/End9/13/2021 7:19 AM Performed by: Shelton Silvas, MD, Demetrio Lapping, CRNA  Patient location: Pre-op. Preanesthetic checklist: patient identified, IV checked, site marked, risks and benefits discussed, surgical consent, monitors and equipment checked, pre-op evaluation, timeout performed and anesthesia consent Lidocaine 1% used for infiltration Right, radial was placed Catheter size: 20 Fr Hand hygiene performed  and maximum sterile barriers used   Attempts: 1 Procedure performed without using ultrasound guided technique. Following insertion, dressing applied and Biopatch. Post procedure assessment: normal and unchanged  Patient tolerated the procedure well with no immediate complications. Additional procedure comments: By Patsi Sears.

## 2020-06-28 ENCOUNTER — Encounter (HOSPITAL_COMMUNITY): Payer: Self-pay | Admitting: Cardiothoracic Surgery

## 2020-06-28 ENCOUNTER — Inpatient Hospital Stay (HOSPITAL_COMMUNITY): Payer: BC Managed Care – PPO

## 2020-06-28 LAB — CBC
HCT: 37 % — ABNORMAL LOW (ref 39.0–52.0)
HCT: 36.2 % — ABNORMAL LOW (ref 39.0–52.0)
Hemoglobin: 11.9 g/dL — ABNORMAL LOW (ref 13.0–17.0)
Hemoglobin: 11.6 g/dL — ABNORMAL LOW (ref 13.0–17.0)
MCH: 28.4 pg (ref 26.0–34.0)
MCH: 28.6 pg (ref 26.0–34.0)
MCHC: 32.2 g/dL (ref 30.0–36.0)
MCHC: 32 g/dL (ref 30.0–36.0)
MCV: 88.3 fL (ref 80.0–100.0)
MCV: 89.2 fL (ref 80.0–100.0)
Platelets: 188 10*3/uL (ref 150–400)
Platelets: 183 10*3/uL (ref 150–400)
RBC: 4.19 MIL/uL — ABNORMAL LOW (ref 4.22–5.81)
RBC: 4.06 MIL/uL — ABNORMAL LOW (ref 4.22–5.81)
RDW: 13.7 % (ref 11.5–15.5)
RDW: 14.1 % (ref 11.5–15.5)
WBC: 13.5 10*3/uL — ABNORMAL HIGH (ref 4.0–10.5)
WBC: 14.6 10*3/uL — ABNORMAL HIGH (ref 4.0–10.5)
nRBC: 0 % (ref 0.0–0.2)
nRBC: 0 % (ref 0.0–0.2)

## 2020-06-28 LAB — BASIC METABOLIC PANEL
Anion gap: 7 (ref 5–15)
Anion gap: 7 (ref 5–15)
BUN: 6 mg/dL (ref 6–20)
BUN: 7 mg/dL (ref 6–20)
CO2: 25 mmol/L (ref 22–32)
CO2: 26 mmol/L (ref 22–32)
Calcium: 8.8 mg/dL — ABNORMAL LOW (ref 8.9–10.3)
Calcium: 9.2 mg/dL (ref 8.9–10.3)
Chloride: 106 mmol/L (ref 98–111)
Chloride: 104 mmol/L (ref 98–111)
Creatinine, Ser: 0.72 mg/dL (ref 0.61–1.24)
Creatinine, Ser: 0.68 mg/dL (ref 0.61–1.24)
GFR calc Af Amer: >60 mL/min (ref >60)
GFR calc Af Amer: >60 mL/min (ref >60)
GFR calc non Af Amer: >60 mL/min (ref >60)
GFR calc non Af Amer: >60 mL/min (ref >60)
Glucose, Bld: 117 mg/dL — ABNORMAL HIGH (ref 70–99)
Glucose, Bld: 106 mg/dL — ABNORMAL HIGH (ref 70–99)
Potassium: 4 mmol/L (ref 3.5–5.1)
Potassium: 3.7 mmol/L (ref 3.5–5.1)
Sodium: 138 mmol/L (ref 135–145)
Sodium: 137 mmol/L (ref 135–145)

## 2020-06-28 LAB — GLUCOSE, CAPILLARY
Glucose-Capillary: 104 mg/dL — ABNORMAL HIGH (ref 70–99)
Glucose-Capillary: 110 mg/dL — ABNORMAL HIGH (ref 70–99)
Glucose-Capillary: 113 mg/dL — ABNORMAL HIGH (ref 70–99)
Glucose-Capillary: 113 mg/dL — ABNORMAL HIGH (ref 70–99)
Glucose-Capillary: 123 mg/dL — ABNORMAL HIGH (ref 70–99)
Glucose-Capillary: 126 mg/dL — ABNORMAL HIGH (ref 70–99)
Glucose-Capillary: 128 mg/dL — ABNORMAL HIGH (ref 70–99)
Glucose-Capillary: 97 mg/dL (ref 70–99)

## 2020-06-28 LAB — MAGNESIUM
Magnesium: 2.3 mg/dL (ref 1.7–2.4)
Magnesium: 2.1 mg/dL (ref 1.7–2.4)

## 2020-06-28 MED ORDER — METOPROLOL TARTRATE 25 MG PO TABS
25.0000 mg | ORAL_TABLET | Freq: Two times a day (BID) | ORAL | Status: DC
  Administered 2020-06-28 – 2020-06-29 (×2): 25 mg via ORAL
  Filled 2020-06-28 (×2): qty 1

## 2020-06-28 MED ORDER — ENOXAPARIN SODIUM 40 MG/0.4ML ~~LOC~~ SOLN: 40.0000 mg | Freq: Every day | SUBCUTANEOUS | Status: DC

## 2020-06-28 MED ORDER — INSULIN ASPART 100 UNIT/ML ~~LOC~~ SOLN
0.0000 [IU] | SUBCUTANEOUS | Status: DC
  Administered 2020-06-28 (×2): 2 [IU] via SUBCUTANEOUS

## 2020-06-28 MED ORDER — ISOSORBIDE MONONITRATE ER 30 MG PO TB24
15.0000 mg | ORAL_TABLET | Freq: Every day | ORAL | Status: DC
  Administered 2020-06-28 – 2020-07-03 (×6): 15 mg via ORAL
  Filled 2020-06-28 (×6): qty 1

## 2020-06-28 MED ORDER — ENOXAPARIN SODIUM 40 MG/0.4ML ~~LOC~~ SOLN
40.0000 mg | Freq: Two times a day (BID) | SUBCUTANEOUS | Status: AC
  Administered 2020-06-28 – 2020-06-29 (×3): 40 mg via SUBCUTANEOUS
  Filled 2020-06-28 (×3): qty 0.4

## 2020-06-28 MED ORDER — METOPROLOL TARTRATE 25 MG/10 ML ORAL SUSPENSION
25.0000 mg | Freq: Two times a day (BID) | ORAL | Status: DC
  Filled 2020-06-28: qty 10

## 2020-06-28 NOTE — Op Note (Signed)
NAME: [place], [place] E. MEDICAL RECORD XN:[id number] ACCOUNT [id number] DATE OF BIRTH:Sep 28, 1976 FACILITY: [facility] LOCATION: [facility] PHYSICIAN:Tramar Brueckner B. Tyrone Sage, MD  OPERATIVE REPORT  DATE OF PROCEDURE:  06/27/2020  PREOPERATIVE DIAGNOSES:  Severe coronary artery disease with total occlusion of the left anterior descending and high-grade stenosis of diagonal.  Paroxysmal atrial fibrillation.  POSTOPERATIVE DIAGNOSIS:  Severe coronary artery disease with total occlusion of the left anterior descending and high-grade stenosis of diagonal.  Paroxysmal atrial fibrillation.  SURGICAL PROCEDURE: 1.  Coronary artery bypass grafting x2 with the left internal mammary artery to the left anterior descending coronary artery, left radial artery to the diagonal coronary artery with left radial artery harvest 2.Placement of AtriCure clip on  Left atrium, 40 mm.  SURGEON:  Sheliah Plane, MD  FIRST ASSISTANT:  Lowella Dandy, PA-C  SECOND ASSISTANT:  Doree Fudge, PA  BRIEF HISTORY:  The patient is a 44 year old male who has struggled with a long history of morbid obesity with a gastric bypass approximately 10 years ago.  He had a history of pulmonary emboli in the past.  In July 2021, he presented with anginal  symptoms and underwent cardiac catheterization by Dr. Jacinto Halim, which demonstrated an ectatic right coronary artery, but without significant stenosis to bypass.  The left anterior descending coronary artery was totally occluded with some faint collateral  filling, a large diagonal coronary artery with 80-85% proximal stenosis, a small circumflex system with 60-70% stenosis.  Ejection fraction was decreased 45%.  The patient was referred as an outpatient to consider coronary artery bypass grafting.   During the cardiac catheterization, he had developed atrial fibrillation.  He had been on anticoagulation because of his history of pulmonary emboli.  His atrial fibrillation was cardioverted and he was  unaware of any further atrial fibrillation.  On  each visit he had been found to be in sinus rhythm.  With the resurgence of the COVID pandemic, when the patient was seen, he had not been vaccinated.  After discussion about the risks and options of surgery, he decided that he would stop smoking, work  on weight reduction, and with the idea that we would proceed with coronary artery bypass grafting after he had been fully vaccinated.  He was followed in the office and continued to stay tobacco free and had lost approximately 40 pounds.  His usual  weight is approximately 410-425 pounds.  Risks and options were discussed with him in detail and he was willing to proceed.  Preoperative studies showed him to be in sinus rhythm.  The left ulnar artery was the primary supplier of blood to the hand.  We  reviewed options of radial artery harvest.  The patient was agreeable to proceed with coronary artery bypass grafting and signed informed consent.  In addition, we discussed possible MAZE procedure and atrial clipping.  DESCRIPTION OF PROCEDURE:  With arterial line in and Swan-Ganz catheter in the right jugular vein the patient was taken to the operating room.  He underwent general endotracheal anesthesia by Dr. Hart Rochester and placement of a TEE probe.  It should be noted  that it took a significant amount of time to get the patient situated on the OR table, prepped and draped, due to his large size.  The skin of the chest, neck, legs and left arm were prepped with Betadine, draped in the usual sterile manner.  Appropriate  timeout was performed and we proceeded with harvest of the left radial artery.  A small incision was made over the radial  artery.  The vessel was occluded with a small bulldog clamp with preservation of the Doppler signal in the palmar arch.  The  incision was then extended along the volar aspect of the arm and using the Harmonic scalpel the left radial artery was harvested.  Simultaneously, the  median sternotomy had been performed and with some difficulty because of the patient's size, we were  able to dissect down the left internal mammary artery.  This was a large vessel with excellent flow.  The vessel was hydrostatically dilated with heparinized saline.  Pericardium was then opened.  The patient was systemically heparinized.  Ascending  aorta was cannulated.  The right atrium was cannulated.  A 22-French metal tipped arterial cannula was used.  The patient was then placed on cardiopulmonary bypass 2.4 liters per minute per meter square.  With elevation of the heart and the patient's  large size and difficult visualization, we decided not to perform a pulmonary vein isolation but the left atrial appendage was measured and a 40 mm AtriCure clip was applied without difficulty.  On examination of the coronary vessels.  The LAD in the mid  and distal vessel was a very large vessel much larger than anticipated from the cath films, it was totally occluded.  In addition to the diagonal was a very large vessel.  The obtuse marginal vessel was smaller than anticipated.  With this in mind, we  then decided to place a radial artery to the large diagonal and internal mammary artery to the large LAD.  Aortic crossclamp was applied; 500 mL of cold blood potassium cardioplegia was administered with diastolic arrest of the additional 300 mL of  plegia was given for a total of 800 mL.  At this point, the heart was elevated and the 40 mm AtriCure clip was applied to the left atrial appendage.  The heart was elevated and the diagonal artery was opened and admitted a 1.5 mm probe easily.  The  vessel was approximately 2-2.5 mm in size.  Using a running 8-0 Prolene, the left radial artery was anastomosed to the mid diagonal coronary artery.  We then turned our attention to the left anterior descending coronary artery and the midportion of the  vessel.  The LAD was opened and likewise easily admitted a 1.5 mm probe.   The vessel itself was approximately 2-2.5 mm in size.  Using a running 8-0 Prolene, the left internal mammary artery was anastomosed to left anterior descending coronary artery.   With release of the bulldog on the mammary artery, there was a prompt rise in myocardial septal temperature.  The bulldog was replaced on the mammary artery.  Additional cold blood cardioplegia was administered down both the left radial artery to the  diagonal and also into the aortic root.  With cross-clamp still in place, a single 4 mm punch aortotomy was performed.  Using a running 7-0 Prolene, the radial artery was anastomosed to the ascending aorta.  The heart was allowed to passively fill and  deair.  The bulldog on the mammary artery was removed with prompt rise in myocardial septal temperature.  The proximal anastomosis was completed and aortic crossclamp was removed with a total crossclamp time of 63 minutes.  The patient spontaneously  converted to a sinus rhythm.  Body temperature was rewarmed to 37 degrees.  Sites anastomosis were inspected and were free of bleeding.  With the patient's body temperature rewarmed atrial and ventricular pacing wires were applied.  He was atrial paced  increased rate.  He was then ventilated and weaned from cardiopulmonary bypass without difficulty.  He remained hemodynamically stable.  TEE showed good overall LV function.  He was decannulated in the usual fashion.  Protamine sulfate was administered.   With operative field hemostatic, a left chest tube and a Blake mediastinal drain were left in place.  The pericardium was loosely reapproximated.  Sternum was closed with #6 double stranded stainless steel wire.  Fascia closed in 3 layers with  interrupted 0 Vicryl and a running 3-0 subcuticular stitch.  The radial artery incision had been closed with interrupted 30 Vicryl .  Skin staples were placed in the skin edges on the left forearm.  We applied a Prevena incisional wound VAC both to  the left arm  incision and also to the sternal incision due to the patient's increased risk of infection due to significant obesity.  Total pump time was 98 minutes.  The patient did not require any blood bank blood products during the operative procedure.  He was  then transferred to the surgical intensive care unit for further postoperative care.  Sponge and needle count had been reported as correct.  CN/NUANCE  D:06/27/2020 T:06/28/2020 JOB:[id number]

## 2020-06-28 NOTE — Progress Notes (Addendum)
TCTS DAILY ICU PROGRESS NOTE                   [address]            [place] [id number]          [phone]   1 Day Post-Op Procedure(s) (LRB): CORONARY ARTERY BYPASS GRAFTING (CABG) X TWO, USING LEFT INTERNAL MAMMARY ARTERY AND LEFT ARM RADIAL ARTERY (N/A) LEFT RADIAL ARTERY HARVEST (Left) CLIPPING OF ATRIAL APPENDAGE USING ATRICURE CLIP SIZE 40 (N/A) TRANSESOPHAGEAL ECHOCARDIOGRAM (TEE) (N/A)  Total Length of Stay:  LOS: 1 day   Subjective:  Doing okay.  Pain is controlled when he gets medication.  He denies N/V.  Objective: Vital signs in last 24 hours: Temp:  [96.4 F (35.8 C)-99.1 F (37.3 C)] 99.1 F (37.3 C) (09/14 0630) Pulse Rate:  [50-97] 90 (09/14 0630) Cardiac Rhythm: Atrial paced (09/14 0400) Resp:  [0-34] 27 (09/14 0630) BP: (90-125)/(48-78) 110/67 (09/14 0630) SpO2:  [94 %-99 %] 94 % (09/14 0630) Arterial Line BP: (33-140)/(17-75) 33/17 (09/13 1730) FiO2 (%):  [40 %-50 %] 40 % (09/13 1800) Weight:  [174.2 kg] 174.2 kg (09/14 0600)  Filed Weights   06/27/20 0618 06/28/20 0600  Weight: (!) 174.6 kg (!) 174.2 kg    Weight change: -0.454 kg   Hemodynamic parameters for last 24 hours: PAP: (14-47)/(7-24) 25/14 CVP:  [2 mmHg-17 mmHg] 8 mmHg CO:  [4.7 L/min-5.9 L/min] 5.9 L/min CI:  [1.7 L/min/m2-2.1 L/min/m2] 2.1 L/min/m2  Intake/Output from previous day: 09/13 0701 - 09/14 0700 In: 4913.7 [I.V.:2905.3; Blood:937; NG/GT:30; IV Piggyback:1041.4] Out: 7519 [Urine:4225; Blood:1442; Chest Tube:390]  Current Meds: Scheduled Meds: . acetaminophen  1,000 mg Oral Q6H   Or  . acetaminophen (TYLENOL) oral liquid 160 mg/5 mL  1,000 mg Per Tube Q6H  . amiodarone  200 mg Oral Daily  . aspirin EC  325 mg Oral Daily   Or  . aspirin  324 mg Per Tube Daily  . atorvastatin  80 mg Oral Daily  . bisacodyl  10 mg Oral Daily   Or  . bisacodyl  10 mg Rectal Daily  . Chlorhexidine Gluconate Cloth  6 each Topical Daily  . Chlorhexidine Gluconate  Cloth  6 each Topical Daily  . docusate sodium  200 mg Oral Daily  . enoxaparin (LOVENOX) injection  40 mg Subcutaneous Q12H  . insulin aspart  0-24 Units Subcutaneous Q4H  . isosorbide mononitrate  15 mg Oral Daily  . mouth rinse  15 mL Mouth Rinse BID  . metoprolol tartrate  12.5 mg Oral BID   Or  . metoprolol tartrate  12.5 mg Per Tube BID  . mupirocin ointment   Nasal BID  . pantoprazole  40 mg Oral Daily  . sodium chloride flush  10-40 mL Intracatheter Q12H  . sodium chloride flush  3 mL Intravenous Q12H   Continuous Infusions: . sodium chloride Stopped (06/28/20 0526)  . sodium chloride    . sodium chloride 20 mL/hr at 06/27/20 1500  . dexmedetomidine (PRECEDEX) IV infusion Stopped (06/28/20 0437)  . lactated ringers    . lactated ringers 20 mL/hr at 06/28/20 0700  . lactated ringers 20 mL/hr at 06/27/20 1600  . phenylephrine (NEO-SYNEPHRINE) Adult infusion     PRN Meds:.sodium chloride, lactated ringers, metoprolol tartrate, midazolam, morphine injection, ondansetron (ZOFRAN) IV, oxyCODONE, sodium chloride flush, sodium chloride flush, traMADol  General appearance: alert, cooperative and no distress Heart: regular rate and rhythm Lungs: diminished breath sounds  bilaterally Abdomen: soft, non-tender; bowel sounds normal; no masses,  no organomegaly Extremities: edema trace Wound: pravena wound vac in place, good sensations, denies numbness in left thumb  Lab Results: CBC: Recent Labs    06/27/20 2023 06/27/20 2023 06/27/20 2100 06/28/20 0411  WBC 16.2*  --   --  13.5*  HGB 13.0   < > 13.6 11.9*  HCT 40.4   < > 40.0 37.0*  PLT 187  --   --  188   < > = values in this interval not displayed.   BMET:  Recent Labs    06/27/20 2023 06/27/20 2023 06/27/20 2100 06/28/20 0411  NA 136   < > 138 138  K 4.4   < > 4.4 4.0  CL 103  --   --  106  CO2 25  --   --  25  GLUCOSE 136*  --   --  117*  BUN 8  --   --  6  CREATININE 0.71  --   --  0.72  CALCIUM 8.8*  --    --  8.8*   < > = values in this interval not displayed.    CMET: Lab Results  Component Value Date   WBC 13.5 (H) 06/28/2020   HGB 11.9 (L) 06/28/2020   HCT 37.0 (L) 06/28/2020   PLT 188 06/28/2020   GLUCOSE 117 (H) 06/28/2020   ALT 33 06/24/2020   AST 23 06/24/2020   NA 138 06/28/2020   K 4.0 06/28/2020   CL 106 06/28/2020   CREATININE 0.72 06/28/2020   BUN 6 06/28/2020   CO2 25 06/28/2020   TSH 1.564 01/17/2020   INR 1.2 06/27/2020   HGBA1C 5.5 06/23/2020      PT/INR:  Recent Labs    06/27/20 2023  LABPROT 15.2  INR 1.2   Radiology: [facility] Chest Port 1 View  Result Date: 06/27/2020 CLINICAL DATA:  Status post coronary bypass graft. EXAM: PORTABLE CHEST 1 VIEW COMPARISON:  June 23, 2020. FINDINGS: Stable cardiomegaly. Endotracheal and nasogastric tubes appear to be in good position. Right internal jugular Swan-Ganz catheter is noted with distal tip in expected position of main pulmonary artery. Left-sided chest tube is noted without definite pneumothorax. Right lung is clear. Mild left basilar subsegmental atelectasis is noted. Bony thorax is unremarkable. IMPRESSION: Endotracheal and nasogastric tubes in good position. Left-sided chest tube is noted without definite pneumothorax. Mild left basilar subsegmental atelectasis is noted. Electronically Signed   By: Lupita Raider M.D.   On: 06/27/2020 15:35   ECHO INTRAOPERATIVE TEE  Result Date: 06/27/2020  *INTRAOPERATIVE TRANSESOPHAGEAL REPORT *  Patient Name:   Joseph Lang  Date of Exam: 06/27/2020 Medical Rec #:  [id number]      Height:       74.0 in Accession #:    [id number]     Weight:       385.0 lb Date of Birth:  1975-10-24      BSA:          2.87 m Patient Age:    44 years       BP:           151/67 mmHg Patient Gender: M              HR:           58 bpm. Exam Location:  Anesthesiology Transesophogeal exam was perform intraoperatively during surgical procedure. Patient was closely monitored under general anesthesia  during the  entirety of examination. Indications:     CAD Native Vessel Sonographer:     Thurman Coyer RDCS (AE) Performing Phys: Shona Simpson MD Diagnosing Phys: Shona Simpson MD Complications: No known complications during this procedure.                       POST-OP IMPRESSIONS s/p CABG x2, Atrial appendage clipping w/ Atricure Clip Size 40 - Left Ventricle: LVEF 50%, unchanged. No significant RWMA's noted. CO > 7L/min - Right Ventricle: The right ventricle appears unchanged from pre-bypass. - Aorta: Layered calficication unchanged, no dissection noted after cannula removal. - Left Atrium: The left atrium appears unchanged from pre-bypass. - Left Atrial Appendage: Cavity appears reduced in size consistent atrial appendage clip placement. - Aortic Valve: The aortic valve appears unchanged from pre-bypass. - Mitral Valve: The mitral valve appears unchanged from pre-bypass. - Tricuspid Valve: The tricuspid valve appears unchanged from pre-bypass. - Interatrial Septum: The interatrial septum appears unchanged from pre-bypass. - Interventricular Septum: The interventricular septum appears unchanged from pre-bypass. - Pericardium: The pericardium appears unchanged from pre-bypass. PRE-OP FINDINGS  Left Ventricle: The left ventricle has low normal systolic function, with an ejection fraction of 50-55%. Unable to obtain high quality short axis view of LV due to resistance when advancing probe, possibly related to anatomical changes s/p gastric bypass. The cavity size was mildly dilated. There is no increase in left ventricular wall thickness. No evidence of left ventricular regional wall motion abnormalities. Right Ventricle: The right ventricle has normal systolic function. The cavity was normal. There is no increase in right ventricular wall thickness. Left Atrium: Left atrial size was slightly increased. The left atrial appendage is well visualized and there is no evidence of thrombus present. Left atrial appendage  velocity is reduced at less than 40 cm/s. Right Atrium: Right atrial size was normal in size. PA catheter traversed RA. Interatrial Septum: No atrial level shunt detected by color flow Doppler. No PFO or ASD present. Pericardium: There is no evidence of pericardial effusion. There is no pleural effusion. Mitral Valve: The mitral valve is normal in structure. Mild thickening of the mitral valve leaflet. No calcification of the mitral valve leaflet. Mitral valve regurgitation is trivial to mild by color flow Doppler. The MR jet is centrally-directed. There  is no evidence of mitral valve vegetation. There is No evidence of mitral stenosis. Tricuspid Valve: The tricuspid valve was normal in structure. Tricuspid valve regurgitation was not visualized by color flow Doppler. The tricuspid valve is mildly thickened. There is no evidence of tricuspid valve vegetation. Aortic Valve: The aortic valve is tricuspid Aortic valve regurgitation was not visualized by color flow Doppler. There is no evidence of aortic valve stenosis. There is no evidence of a vegetation on the aortic valve. Pulmonic Valve: The pulmonic valve was normal in structure. Pulmonic valve regurgitation is not visualized by color flow Doppler. Aorta: The aortic root, ascending aorta and aortic arch are normal in size and structure. There is evidence of layered immobile plaque in the descending aorta; Grade I, measuring 1-27mm in size. Pulmonary Artery: The pulmonary artery is of normal size. Venous: The inferior vena cava was not well visualized.  Shona Simpson MD Electronically signed by Shona Simpson MD Signature Date/Time: 06/27/2020/2:16:15 PM    Final      Assessment/Plan: S/P Procedure(s) (LRB): CORONARY ARTERY BYPASS GRAFTING (CABG) X TWO, USING LEFT INTERNAL MAMMARY ARTERY AND LEFT ARM RADIAL ARTERY (N/A) LEFT RADIAL ARTERY HARVEST (Left) CLIPPING OF ATRIAL  APPENDAGE USING ATRICURE CLIP SIZE 40 (N/A) TRANSESOPHAGEAL ECHOCARDIOGRAM (TEE)  (N/A)  1. CV- NSR, stop NTG, Dopamine- start Imdur for radial graft, continue oral Amiodarone 2. Pulm- no acute issues,  CXR with some atelectasis, CT output 390 3. Renal- creatinine WNL, weight is stable, off lasix 4. Expected post operative blood loss anemia, mild Hgb at 11.9 5. Expected post operative thrombocytopenia, mild ct at 188 6. CBGs controlled, off insulin drip, start SSIP 7. Dispo- patient stable, stop Dopamine, NTG drip, start Imdur for radial artery graft, POD #1 progression orders, mobilize   Lowella Dandy, PA-C 06/28/2020 7:22 AM  Expected Acute  Blood - loss Anemia- continue to monitor  No blood transfusion given  Renal function stable - wean and d/c dopamine  Lovenox bid today - for dvt prophylaxis likely resume Xarelto  quickly  With history of PE/ DVT  Leave incisional wound vacs in place  I have seen and examined Joseph E Chaires and agree with the above assessment  and plan.  Delight Ovens MD Beeper [phone] Office [phone] 06/28/2020 7:37 AM

## 2020-06-28 NOTE — Progress Notes (Signed)
CT surgery p.m. Rounds  Patient had stable day after multivessel CABG yesterday Up in bed to chair O2 saturation 92% on 2 L P.m. labs reviewed and are satisfactory  Blood pressure 120/70, pulse 79, temperature 98.7 F (37.1 C), resp. rate (!) 23, height 6\' 2"  (1.88 m), weight (!) 174.2 kg, SpO2 98 %.

## 2020-06-29 ENCOUNTER — Encounter (HOSPITAL_COMMUNITY): Payer: Self-pay | Admitting: Cardiothoracic Surgery

## 2020-06-29 ENCOUNTER — Inpatient Hospital Stay (HOSPITAL_COMMUNITY): Payer: BC Managed Care – PPO

## 2020-06-29 LAB — BASIC METABOLIC PANEL
Anion gap: 9 (ref 5–15)
BUN: 8 mg/dL (ref 6–20)
CO2: 24 mmol/L (ref 22–32)
Calcium: 9.7 mg/dL (ref 8.9–10.3)
Chloride: 106 mmol/L (ref 98–111)
Creatinine, Ser: 0.85 mg/dL (ref 0.61–1.24)
GFR calc Af Amer: >60 mL/min (ref >60)
GFR calc non Af Amer: >60 mL/min (ref >60)
Glucose, Bld: 111 mg/dL — ABNORMAL HIGH (ref 70–99)
Potassium: 3.9 mmol/L (ref 3.5–5.1)
Sodium: 139 mmol/L (ref 135–145)

## 2020-06-29 LAB — CBC
HCT: 39.9 % (ref 39.0–52.0)
Hemoglobin: 12.9 g/dL — ABNORMAL LOW (ref 13.0–17.0)
MCH: 29.2 pg (ref 26.0–34.0)
MCHC: 32.3 g/dL (ref 30.0–36.0)
MCV: 90.3 fL (ref 80.0–100.0)
Platelets: 222 10*3/uL (ref 150–400)
RBC: 4.42 MIL/uL (ref 4.22–5.81)
RDW: 14.2 % (ref 11.5–15.5)
WBC: 17.2 10*3/uL — ABNORMAL HIGH (ref 4.0–10.5)
nRBC: 0 % (ref 0.0–0.2)

## 2020-06-29 LAB — GLUCOSE, CAPILLARY
Glucose-Capillary: 108 mg/dL — ABNORMAL HIGH (ref 70–99)
Glucose-Capillary: 110 mg/dL — ABNORMAL HIGH (ref 70–99)
Glucose-Capillary: 155 mg/dL — ABNORMAL HIGH (ref 70–99)
Glucose-Capillary: 97 mg/dL (ref 70–99)

## 2020-06-29 MED ORDER — RIVAROXABAN 20 MG PO TABS
20.0000 mg | ORAL_TABLET | Freq: Every day | ORAL | Status: DC
  Administered 2020-06-29 – 2020-07-02 (×4): 20 mg via ORAL
  Filled 2020-06-29 (×4): qty 1

## 2020-06-29 MED ORDER — METOPROLOL TARTRATE 25 MG/10 ML ORAL SUSPENSION
37.5000 mg | Freq: Two times a day (BID) | ORAL | Status: DC
  Filled 2020-06-29 (×5): qty 15

## 2020-06-29 MED ORDER — ~~LOC~~ CARDIAC SURGERY, PATIENT & FAMILY EDUCATION: Freq: Once | Status: AC

## 2020-06-29 MED ORDER — METOPROLOL TARTRATE 25 MG PO TABS
37.5000 mg | ORAL_TABLET | Freq: Two times a day (BID) | ORAL | Status: DC
  Administered 2020-06-29 – 2020-07-01 (×3): 37.5 mg via ORAL
  Filled 2020-06-29 (×4): qty 1

## 2020-06-29 MED ORDER — SODIUM CHLORIDE 0.9% FLUSH: 3.0000 mL | INTRAVENOUS | Status: DC | PRN

## 2020-06-29 MED ORDER — LACTULOSE 10 GM/15ML PO SOLN: 20.0000 g | Freq: Every day | ORAL | Status: DC | PRN

## 2020-06-29 MED ORDER — ALPRAZOLAM 0.25 MG PO TABS
0.2500 mg | ORAL_TABLET | Freq: Once | ORAL | Status: AC
  Administered 2020-06-29: 0.25 mg via ORAL
  Filled 2020-06-29: qty 1

## 2020-06-29 MED ORDER — SODIUM CHLORIDE 0.9 % IV SOLN: 250.0000 mL | INTRAVENOUS | Status: DC | PRN

## 2020-06-29 MED ORDER — DIPHENHYDRAMINE HCL 25 MG PO CAPS
25.0000 mg | ORAL_CAPSULE | Freq: Every evening | ORAL | Status: DC | PRN
  Administered 2020-06-29: 25 mg via ORAL
  Filled 2020-06-29: qty 1

## 2020-06-29 MED ORDER — SIMETHICONE 80 MG PO CHEW
80.0000 mg | CHEWABLE_TABLET | Freq: Four times a day (QID) | ORAL | Status: DC | PRN
  Administered 2020-06-29 – 2020-06-30 (×2): 80 mg via ORAL
  Filled 2020-06-29 (×2): qty 1

## 2020-06-29 MED ORDER — SODIUM CHLORIDE 0.9% FLUSH
3.0000 mL | Freq: Two times a day (BID) | INTRAVENOUS | Status: DC
  Administered 2020-06-29 – 2020-06-30 (×4): 3 mL via INTRAVENOUS

## 2020-06-29 MED FILL — Sodium Bicarbonate IV Soln 8.4%: INTRAVENOUS | Qty: 50 | Status: AC

## 2020-06-29 MED FILL — Lidocaine HCl Local Soln Prefilled Syringe 100 MG/5ML (2%): INTRAMUSCULAR | Qty: 5 | Status: AC

## 2020-06-29 MED FILL — Sodium Chloride IV Soln 0.9%: INTRAVENOUS | Qty: 4000 | Status: AC

## 2020-06-29 MED FILL — Electrolyte-R (PH 7.4) Solution: INTRAVENOUS | Qty: 3000 | Status: AC

## 2020-06-29 MED FILL — Heparin Sodium (Porcine) Inj 1000 Unit/ML: INTRAMUSCULAR | Qty: 10 | Status: AC

## 2020-06-29 MED FILL — Mannitol IV Soln 20%: INTRAVENOUS | Qty: 500 | Status: AC

## 2020-06-29 MED FILL — Heparin Sodium (Porcine) Inj 1000 Unit/ML: INTRAMUSCULAR | Qty: 30 | Status: AC

## 2020-06-29 MED FILL — Magnesium Sulfate Inj 50%: INTRAMUSCULAR | Qty: 10 | Status: AC

## 2020-06-29 MED FILL — Potassium Chloride Inj 2 mEq/ML: INTRAVENOUS | Qty: 40 | Status: AC

## 2020-06-29 NOTE — Progress Notes (Deleted)
Pt. refused 0400 CBG. 

## 2020-06-29 NOTE — Evaluation (Signed)
Physical Therapy Evaluation Patient Details Name: Joseph Lang MRN: [id number] DOB: May 23, 1976 Today's Date: 06/29/2020   History of Present Illness  44 yo s/p CABG x 2 with left radial artery harvest on 9/14. PMhx: obesity, gastric sleeve, OSA, HTN, CAD, Afib  Clinical Impression  Pt supine on arrival after having pacing wires pulled reporting back pain and discomfort no matter what position he is in and inability to sleep. Pt with decreased strength, transfers, safety, gait and cardiopulmonary function who will benefit from acute therapy to maximize mobility and independence. 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 Pt lives with mom and is concerned she will not be able to physically assist at home. Pt benefits from encouragement and reassurance as well as education for precautions and goals to progress home.   HR 115-133 Pre gait 143/61 (81) Post gait 138/81 (98) 94% on RA    Follow Up Recommendations Home health PT;Supervision/Assistance - 24 hour    Equipment Recommendations  Rolling walker with 5" wheels;3in1 (PT)    Recommendations for Other Services OT consult     Precautions / Restrictions Precautions Precautions: Sternal Precaution Booklet Issued: Yes (comment) Precaution Comments: sternal and LUE wound VAC, watch HR      Mobility  Bed Mobility Overal bed mobility: Needs Assistance Bed Mobility: Rolling;Sidelying to Sit Rolling: Min assist Sidelying to sit: Min assist       General bed mobility comments: cues for sequence, increased time and assist to elevate trunk  Transfers Overall transfer level: Needs assistance   Transfers: Sit to/from Stand Sit to Stand: Min guard         General transfer comment: cues for hands on thighs and safety  Ambulation/Gait Ambulation/Gait assistance: Min guard Gait Distance (Feet): 180 Feet Assistive device: Rolling walker (2 wheeled) Gait Pattern/deviations: Step-through pattern;Decreased  stride length;Trunk flexed   Gait velocity interpretation: >2.62 ft/sec, indicative of community ambulatory General Gait Details: cues for posture, direction, proximity to Rw, breathing technique pt with HR up to 133 with limited gait  Stairs            Wheelchair Mobility    Modified Rankin (Stroke Patients Only)       Balance Overall balance assessment: No apparent balance deficits (not formally assessed)                                           Pertinent Vitals/Pain Pain Assessment: 0-10 Pain Score: 8  Pain Location: back Pain Descriptors / Indicators: Aching;Guarding;Constant Pain Intervention(s): Limited activity within patient's tolerance;Monitored during session;Repositioned    Home Living Family/patient expects to be discharged to:: Private residence Living Arrangements: Parent Available Help at Discharge: Family;Available 24 hours/day Type of Home: Mobile home Home Access: Stairs to enter Entrance Stairs-Rails: None Entrance Stairs-Number of Steps: 2 Home Layout: One level Home Equipment: None      Prior Function Level of Independence: Independent               Hand Dominance        Extremity/Trunk Assessment   Upper Extremity Assessment Upper Extremity Assessment: Generalized weakness (limited by pain and precautions)    Lower Extremity Assessment Lower Extremity Assessment: Overall WFL for tasks assessed    Cervical / Trunk Assessment Cervical / Trunk Assessment: Normal  Communication   Communication: No difficulties  Cognition Arousal/Alertness: Awake/alert Behavior During Therapy:  WFL for tasks assessed/performed Overall Cognitive Status: Within Functional Limits for tasks assessed                                        General Comments      Exercises General Exercises - Lower Extremity Long Arc Quad: AROM;Both;10 reps;Seated Hip Flexion/Marching: AROM;Both;10 reps;Seated    Assessment/Plan    PT Assessment Patient needs continued PT services  PT Problem List Decreased strength;Decreased mobility;Decreased safety awareness;Decreased activity tolerance;Decreased balance;Decreased knowledge of use of DME;Pain       PT Treatment Interventions Gait training;DME instruction;Therapeutic exercise;Balance training;Functional mobility training;Stair training;Therapeutic activities;Patient/family education    PT Goals (Current goals can be found in the Care Plan section)  Acute Rehab PT Goals Patient Stated Goal: return to fishing, work PT Goal Formulation: With patient Time For Goal Achievement: 07/13/20 Potential to Achieve Goals: Good    Frequency Min 3X/week   Barriers to discharge Decreased caregiver support mom lives with pt but cannot provide physical assist    Co-evaluation               AM-PAC PT "6 Clicks" Mobility  Outcome Measure Help needed turning from your back to your side while in a flat bed without using bedrails?: A Little Help needed moving from lying on your back to sitting on the side of a flat bed without using bedrails?: A Little Help needed moving to and from a bed to a chair (including a wheelchair)?: A Little Help needed standing up from a chair using your arms (e.g., wheelchair or bedside chair)?: A Little Help needed to walk in hospital room?: A Little Help needed climbing 3-5 steps with a railing? : A Lot 6 Click Score: 17    End of Session   Activity Tolerance: Patient tolerated treatment well Patient left: in chair;with call bell/phone within reach;with nursing/sitter in room Nurse Communication: Mobility status PT Visit Diagnosis: Other abnormalities of gait and mobility (R26.89);Difficulty in walking, not elsewhere classified (R26.2);Pain    Time: 6283-1517 PT Time Calculation (min) (ACUTE ONLY): 29 min   Charges:   PT Evaluation $PT Eval Moderate Complexity: 1 Mod PT Treatments $Gait Training: 8-22  mins        Keiri Solano P, PT Acute Rehabilitation Services Pager: [phone] Office: [phone]   Bennett Ram B Lavere Lang 06/29/2020, 1:58 PM

## 2020-06-29 NOTE — Progress Notes (Addendum)
TCTS DAILY ICU PROGRESS NOTE                   [address]            [place] [address]          [phone]   2 Days Post-Op Procedure(s) (LRB): CORONARY ARTERY BYPASS GRAFTING (CABG) X TWO, USING LEFT INTERNAL MAMMARY ARTERY AND LEFT ARM RADIAL ARTERY (N/A) LEFT RADIAL ARTERY HARVEST (Left) CLIPPING OF ATRIAL APPENDAGE USING ATRICURE CLIP SIZE 40 (N/A) TRANSESOPHAGEAL ECHOCARDIOGRAM (TEE) (N/A)  Total Length of Stay:  LOS: 2 days   Subjective:  Patient states he is doing terrible.  States he only slept 2 hours last night.  He states he has a lot of gas but is unable to clear and really needs to move his bowels.  He has not yet ambulated in the hallway.  Objective: Vital signs in last 24 hours: Temp:  [98.1 F (36.7 C)-99.5 F (37.5 C)] 99.5 F (37.5 C) (09/15 0400) Pulse Rate:  [79-90] 85 (09/15 0600) Cardiac Rhythm: Atrial paced (09/14 2000) Resp:  [19-31] 26 (09/15 0600) BP: (99-127)/(54-76) 120/70 (09/15 0600) SpO2:  [91 %-99 %] 95 % (09/15 0600) Weight:  [173.6 kg] 173.6 kg (09/15 0400)  Filed Weights   06/27/20 0618 06/28/20 0600 06/29/20 0400  Weight: (!) 174.6 kg (!) 174.2 kg (!) 173.6 kg    Weight change: -0.581 kg   Hemodynamic parameters for last 24 hours: PAP: (26)/(15) 26/15 CVP:  [7 mmHg] 7 mmHg  Intake/Output from previous day: 09/14 0701 - 09/15 0700 In: 515.8 [P.O.:480; I.V.:35.8] Out: 910 [Urine:900; Chest Tube:10]  Current Meds: Scheduled Meds: . acetaminophen  1,000 mg Oral Q6H   Or  . acetaminophen (TYLENOL) oral liquid 160 mg/5 mL  1,000 mg Per Tube Q6H  . amiodarone  200 mg Oral Daily  . aspirin EC  325 mg Oral Daily   Or  . aspirin  324 mg Per Tube Daily  . atorvastatin  80 mg Oral Daily  . bisacodyl  10 mg Oral Daily   Or  . bisacodyl  10 mg Rectal Daily  . Chlorhexidine Gluconate Cloth  6 each Topical Daily  . Chlorhexidine Gluconate Cloth  6 each Topical Daily  . docusate sodium  200 mg Oral Daily  .  enoxaparin (LOVENOX) injection  40 mg Subcutaneous Q12H  . insulin aspart  0-24 Units Subcutaneous Q4H  . isosorbide mononitrate  15 mg Oral Daily  . mouth rinse  15 mL Mouth Rinse BID  . metoprolol tartrate  25 mg Oral BID   Or  . metoprolol tartrate  25 mg Per Tube BID  . mupirocin ointment   Nasal BID  . pantoprazole  40 mg Oral Daily  . sodium chloride flush  10-40 mL Intracatheter Q12H  . sodium chloride flush  3 mL Intravenous Q12H   Continuous Infusions: . sodium chloride Stopped (06/28/20 0526)  . sodium chloride    . sodium chloride 20 mL/hr at 06/27/20 1500  . dexmedetomidine (PRECEDEX) IV infusion Stopped (06/28/20 0437)  . lactated ringers    . lactated ringers Stopped (06/28/20 0820)  . lactated ringers 20 mL/hr at 06/27/20 1600  . phenylephrine (NEO-SYNEPHRINE) Adult infusion     PRN Meds:.sodium chloride, lactated ringers, metoprolol tartrate, midazolam, morphine injection, ondansetron (ZOFRAN) IV, oxyCODONE, sodium chloride flush, sodium chloride flush, traMADol  General appearance: alert, cooperative and no distress Heart: regular rate and rhythm Lungs: diminished breath sounds bibasilar Abdomen:  soft, non-tender; bowel sounds normal; no masses,  no organomegaly Extremities: edema trace Wound: pravena remain in place on sternotomy and radial artery site, + parasthesia today  Lab Results: CBC: Recent Labs    06/28/20 1700 06/29/20 0400  WBC 14.6* 17.2*  HGB 11.6* 12.9*  HCT 36.2* 39.9  PLT 183 222   BMET:  Recent Labs    06/28/20 1700 06/29/20 0400  NA 137 139  K 3.7 3.9  CL 104 106  CO2 26 24  GLUCOSE 106* 111*  BUN 7 8  CREATININE 0.68 0.85  CALCIUM 9.2 9.7    CMET: Lab Results  Component Value Date   WBC 17.2 (H) 06/29/2020   HGB 12.9 (L) 06/29/2020   HCT 39.9 06/29/2020   PLT 222 06/29/2020   GLUCOSE 111 (H) 06/29/2020   ALT 33 06/24/2020   AST 23 06/24/2020   NA 139 06/29/2020   K 3.9 06/29/2020   CL 106 06/29/2020   CREATININE  0.85 06/29/2020   BUN 8 06/29/2020   CO2 24 06/29/2020   TSH 1.564 01/17/2020   INR 1.2 06/27/2020   HGBA1C 5.5 06/23/2020      PT/INR:  Recent Labs    06/27/20 2023  LABPROT 15.2  INR 1.2   Radiology: No results found.   Assessment/Plan: S/P Procedure(s) (LRB): CORONARY ARTERY BYPASS GRAFTING (CABG) X TWO, USING LEFT INTERNAL MAMMARY ARTERY AND LEFT ARM RADIAL ARTERY (N/A) LEFT RADIAL ARTERY HARVEST (Left) CLIPPING OF ATRIAL APPENDAGE USING ATRICURE CLIP SIZE 40 (N/A) TRANSESOPHAGEAL ECHOCARDIOGRAM (TEE) (N/A)  1. CV- H/O A. Fib, currently NSR with PVCs, bigeminy at times- continue Amiodarone, Lopressor, Imdur for radial artery graft... will d/c EPW today, restart home Xarelto this evening 2. Pulm- wean oxygen as tolerated, + bilateral atelectasis, likely poor IS effort by patient and he is not up and ambulating much yet, will add flutter valve 3. Renal- creatinine WNL, K is WNL, weight is stable, no significant edema present 4. GI- + gas, no BM.Marland Kitchen add simethicone, lactulose prn..... told patient mobility can help facilitate bowels to move 5. Expected post operative blood loss anemia, mild, stable 6. Dispo- patient stable, in NSR with PVCs, will d/c EPW today, restart Xarelto this evening due to history of PE/DVT, add flutter valve, bowel issues addressed, transfer to 4E    Erin Barrett, PA-C 06/29/2020 7:14 AM  Sternum stable wound vacs in place  Start xarelto tonight and transition off lovenox To 4e I have seen and examined [place] E Peter and agree with the above assessment  and plan.  Delight Ovens MD Beeper [phone] Office [phone] 06/29/2020 7:42 AM

## 2020-06-29 NOTE — Progress Notes (Signed)
CARDIAC REHAB PHASE I   PRE:  Rate/Rhythm: 105 ST    BP: sitting     SaO2:   MODE:  Ambulation: 370 ft   POST:  Rate/Rhythm: 132 ST    BP: sitting 135/86     SaO2: 89-91 RA  Upon arrival pt needed to urgently have BM. Able to walk to BR with x2 assist with wires. Pt with successful BM, mostly cleaned himself. Ambulated with RW, assist x2 standby for lines. HR increased with distance.SaO2 89-91 during walk. Encouraged pt to rest but he declined.    Pt fairly steady. To recliner, VSS. With rest SaO2 88 RA therefore reapplied 2L. Pt generally anxious, c/o feeling his HR. Overall doing well. Encouraged another walk this pm and IS. Pt sts "I'm not eating that food", encouraged him to eat what he can. [id number] Joseph Lang CES, ACSM 06/29/2020 2:34 PM

## 2020-06-30 ENCOUNTER — Inpatient Hospital Stay: Payer: Self-pay

## 2020-06-30 ENCOUNTER — Inpatient Hospital Stay (HOSPITAL_COMMUNITY): Payer: BC Managed Care – PPO

## 2020-06-30 LAB — CBC
HCT: 40 % (ref 39.0–52.0)
Hemoglobin: 12.9 g/dL — ABNORMAL LOW (ref 13.0–17.0)
MCH: 28.8 pg (ref 26.0–34.0)
MCHC: 32.3 g/dL (ref 30.0–36.0)
MCV: 89.3 fL (ref 80.0–100.0)
Platelets: 239 10*3/uL (ref 150–400)
RBC: 4.48 MIL/uL (ref 4.22–5.81)
RDW: 14.3 % (ref 11.5–15.5)
WBC: 13.7 10*3/uL — ABNORMAL HIGH (ref 4.0–10.5)
nRBC: 0 % (ref 0.0–0.2)

## 2020-06-30 LAB — BASIC METABOLIC PANEL
Anion gap: 7 (ref 5–15)
BUN: 5 mg/dL — ABNORMAL LOW (ref 6–20)
CO2: 27 mmol/L (ref 22–32)
Calcium: 9.5 mg/dL (ref 8.9–10.3)
Chloride: 105 mmol/L (ref 98–111)
Creatinine, Ser: 0.78 mg/dL (ref 0.61–1.24)
GFR calc Af Amer: >60 mL/min (ref >60)
GFR calc non Af Amer: >60 mL/min (ref >60)
Glucose, Bld: 135 mg/dL — ABNORMAL HIGH (ref 70–99)
Potassium: 3.4 mmol/L — ABNORMAL LOW (ref 3.5–5.1)
Sodium: 139 mmol/L (ref 135–145)

## 2020-06-30 MED ORDER — AMIODARONE HCL 200 MG PO TABS
200.0000 mg | ORAL_TABLET | Freq: Two times a day (BID) | ORAL | Status: DC
  Administered 2020-06-30 – 2020-07-01 (×3): 200 mg via ORAL
  Filled 2020-06-30 (×3): qty 1

## 2020-06-30 MED ORDER — ASPIRIN EC 81 MG PO TBEC
81.0000 mg | DELAYED_RELEASE_TABLET | Freq: Every day | ORAL | Status: DC
  Administered 2020-06-30 – 2020-07-03 (×4): 81 mg via ORAL
  Filled 2020-06-30 (×4): qty 1

## 2020-06-30 MED ORDER — POTASSIUM CHLORIDE CRYS ER 20 MEQ PO TBCR
40.0000 meq | EXTENDED_RELEASE_TABLET | Freq: Once | ORAL | Status: AC
  Administered 2020-06-30: 40 meq via ORAL
  Filled 2020-06-30: qty 2

## 2020-06-30 MED ORDER — DIGOXIN 0.25 MG/ML IJ SOLN
0.1250 mg | Freq: Every day | INTRAMUSCULAR | Status: DC
  Administered 2020-06-30: 0.125 mg via INTRAVENOUS
  Filled 2020-06-30 (×2): qty 0.5

## 2020-06-30 MED ORDER — SODIUM CHLORIDE 0.9% FLUSH: 10.0000 mL | INTRAVENOUS | Status: DC | PRN

## 2020-06-30 MED ORDER — SODIUM CHLORIDE 0.9% FLUSH
10.0000 mL | Freq: Two times a day (BID) | INTRAVENOUS | Status: DC
  Administered 2020-06-30 – 2020-07-02 (×5): 10 mL

## 2020-06-30 MED ORDER — DILTIAZEM HCL 60 MG PO TABS
30.0000 mg | ORAL_TABLET | Freq: Four times a day (QID) | ORAL | Status: DC
  Administered 2020-06-30 – 2020-07-01 (×4): 30 mg via ORAL
  Filled 2020-06-30 (×4): qty 1

## 2020-06-30 MED ORDER — ASPIRIN 81 MG PO CHEW: 81.0000 mg | CHEWABLE_TABLET | Freq: Every day | ORAL | Status: DC

## 2020-06-30 MED ORDER — AMIODARONE IV BOLUS ONLY 150 MG/100ML
150.0000 mg | Freq: Once | INTRAVENOUS | Status: AC
  Administered 2020-06-30: 150 mg via INTRAVENOUS
  Filled 2020-06-30: qty 100

## 2020-06-30 MED ORDER — AMIODARONE IV BOLUS ONLY 150 MG/100ML
150.0000 mg | Freq: Once | INTRAVENOUS | Status: AC
  Administered 2020-06-30: 150 mg via INTRAVENOUS
  Filled 2020-06-30 (×2): qty 100

## 2020-06-30 MED ORDER — FUROSEMIDE 40 MG PO TABS
40.0000 mg | ORAL_TABLET | Freq: Every day | ORAL | Status: DC
  Administered 2020-06-30 – 2020-07-02 (×3): 40 mg via ORAL
  Filled 2020-06-30 (×4): qty 1

## 2020-06-30 NOTE — Progress Notes (Signed)
IJ introducer removed, pt tolerated well. VSS. Will continue to monitor.  Hazle Nordmann, RN

## 2020-06-30 NOTE — H&P (View-Only) (Signed)
CARDIOLOGY CONSULT NOTE  Patient ID: Joseph Lang MRN: [id number] DOB/AGE: [birth year] 46 y.o.  Admit date: 06/27/2020 Referring Physician  Ed. Tyrone SageGerhardt, MD Primary Physician:  Georgann HousekeeperHusain, Karrar, MD Reason for Consultation  A. Fib with RVR  Patient ID: Joseph Lang, male    DOB: [birth year], 44 y.o.   MRN: [id number]  CC: A. Fibrillation  HPI:    Joseph Lang  is a 44 y.o. Caucasian  male patient with past medical history of paroxysmal atrial fibrillation and cardioversion on 01/17/2020, hypertension, morbid obesity, obstructive sleep apnea on CPAP, tobacco use disorder, history of acute deep vein thrombosis and pulmonary embolism on 04/08/2015 following gastric sleeve resection 6 weeks prior, and again presented with spontaneous right leg DVT on 03/07/2019 presently on long term Xarelto, underwent cardiac catheterization for abnormal stress test and new onset atrial fibrillation on 04/19/2020 revealing multivessel disease, patient went back into A. fib with RVR during cardiac catheterization.    He underwent repeat direct-current cardioversion on 04/26/2020 which was successful.  He has been evaluated by Dr. Elise BenneEd Gerhart and underwent CABG x 2 and LAA ligation on 06/27/2020. Post operativel y, he recuperated well and extubated without complications, but then developed atrial fibrillation with RVR. Has had low BP and patient although stable has not advanced with regards to cardiac rehab and ambulation due to elevated HR. I was consulted to manage A. Fib with RVR.  Patient states that he is presently doing well and is ready to mobilize and increase his physical activity.  He has not had any chest pain or dyspnea.  States that since he went into atrial fibrillation he feels his heart is racing.  Past Medical History:  Diagnosis Date  . Coronary artery disease 2021  . Dysrhythmia 2021   Afib  . GERD (gastroesophageal reflux disease)   . Hx of deep vein thrombophlebitis of lower extremity   . Hx of  gastric bypass   . Hx of pulmonary embolus   . Hypertension   . Obesity   . PONV (postoperative nausea and vomiting)    Throwing up after wt loss surgery  . S/P CABG x 2 06/28/2020: LIMA to LAD, Free Radial graft to D1. LAA ligation with 40 mm Atricure. 06/27/2020  . Sleep apnea   . Tobacco abuse    Past Surgical History:  Procedure Laterality Date  . CARDIAC CATHETERIZATION Left 2021  . CARDIOVERSION    . CARDIOVERSION N/A 04/19/2020   Procedure: CARDIOVERSION;  Surgeon: Yates DecampGanji, Magdalena Skilton, MD;  [facility];  Service: Cardiovascular;  Laterality: N/A;  . CARDIOVERSION N/A 04/26/2020   Procedure: CARDIOVERSION;  Surgeon: Yates DecampGanji, Suzanne Garbers, MD;  [facility];  Service: Cardiovascular;  Laterality: N/A;  . CLIPPING OF ATRIAL APPENDAGE N/A 06/27/2020   Procedure: CLIPPING OF ATRIAL APPENDAGE USING ATRICURE CLIP SIZE 40;  Surgeon: Delight OvensGerhardt, Edward B, MD;  Location: [facility] OR;  Service: Open Heart Surgery;  Laterality: N/A;  . CORONARY ARTERY BYPASS GRAFT N/A 06/27/2020   Procedure: CORONARY ARTERY BYPASS GRAFTING (CABG) X TWO, USING LEFT INTERNAL MAMMARY ARTERY AND LEFT ARM RADIAL ARTERY;  Surgeon: Delight OvensGerhardt, Edward B, MD;  Location: [facility] OR;  Service: Open Heart Surgery;  Laterality: N/A;  . FRACTURE SURGERY  1995   pelvis  . HIP SURGERY    . LAPAROSCOPIC GASTRIC SLEEVE RESECTION N/A 02/14/2015   Procedure: LAPAROSCOPIC GASTRIC SLEEVE RESECTION WITH UPPER ENDOSCOPY;  Surgeon: Luretha MurphyMatthew Martin, MD;  Location: [facility] ORS;  Service: General;  Laterality: N/A;  . LEFT HEART CATH  AND CORONARY ANGIOGRAPHY N/A 04/19/2020   Procedure: LEFT HEART CATH AND CORONARY ANGIOGRAPHY;  Surgeon: Yates Decamp, MD;  Location: [facility] INVASIVE CV LAB;  Service: Cardiovascular;  Laterality: N/A;  . PELVIS CLOSED REDUCTION    . RADIAL ARTERY HARVEST Left 06/27/2020   Procedure: LEFT RADIAL ARTERY HARVEST;  Surgeon: Delight Ovens, MD;  Location: [facility] OR;  Service: Open Heart Surgery;  Laterality: Left;  . TEE WITHOUT CARDIOVERSION  N/A 06/27/2020   Procedure: TRANSESOPHAGEAL ECHOCARDIOGRAM (TEE);  Surgeon: Delight Ovens, MD;  Location: [facility] OR;  Service: Open Heart Surgery;  Laterality: N/A;   Social History   Tobacco Use  . Smoking status: Former Smoker    Packs/day: 0.50    Years: 28.00    Pack years: 14.00    Types: Cigarettes    Quit date: 04/20/2020    Years since quitting: 0.1  . Smokeless tobacco: Never Used  . Tobacco comment: half a pack daily  Substance Use Topics  . Alcohol use: Not Currently    Marital Sttus: Single  ROS  Review of Systems  Cardiovascular: Positive for chest pain (at the surgical site) and palpitations. Negative for dyspnea on exertion and leg swelling.  Gastrointestinal: Negative for melena.  All other systems reviewed and are negative.  Objective   Vitals with BMI 06/30/2020 06/30/2020 06/30/2020  Height - - -  Weight - - -  BMI - - -  Systolic 108 88 98  Diastolic 60 59 45  Pulse 146 140 146    Blood pressure 108/60, pulse (!) 146, temperature 98.6 F (37 C), temperature source Oral, resp. rate (!) 27, height 6\' 2"  (1.88 m), weight (!) 171.2 kg, SpO2 91 %.    Physical Exam Constitutional:      Comments: Morbidly obese in no acute distress  Cardiovascular:     Rate and Rhythm: Tachycardia present. Rhythm irregularly irregular.     Pulses: Normal pulses and intact distal pulses.     Heart sounds: No murmur heard.  No gallop. No S3 or S4 sounds.      Comments: S1 is variable, S2 is normal. No JVD, No leg edema. Pulmonary:     Effort: Pulmonary effort is normal.     Breath sounds: Normal breath sounds.  Abdominal:     General: Bowel sounds are normal.     Palpations: Abdomen is soft.     Comments: Pannus present  Musculoskeletal:        General: Normal range of motion.    Laboratory examination:   Recent Labs    06/28/20 1700 06/29/20 0400 06/30/20 0500  NA 137 139 139  K 3.7 3.9 3.4*  CL 104 106 105  CO2 26 24 27   GLUCOSE 106* 111* 135*  BUN 7 8 5*   CREATININE 0.68 0.85 0.78  CALCIUM 9.2 9.7 9.5  GFRNONAA >60 >60 >60  GFRAA >60 >60 >60   estimated creatinine clearance is 192.2 mL/min (by C-G formula based on SCr of 0.78 mg/dL).  CMP Latest Ref Rng & Units 06/30/2020 06/29/2020 06/28/2020  Glucose 70 - 99 mg/dL 07/01/2020) 06/30/2020) 324(M)  BUN 6 - 20 mg/dL 5(L) 8 7  Creatinine 010(U - 1.24 mg/dL 725(D 6.64 4.03  Sodium 135 - 145 mmol/L 139 139 137  Potassium 3.5 - 5.1 mmol/L 3.4(L) 3.9 3.7  Chloride 98 - 111 mmol/L 105 106 104  CO2 22 - 32 mmol/L 27 24 26   Calcium 8.9 - 10.3 mg/dL 9.5 9.7 9.2  Total Protein 6.5 -  8.1 g/dL - - -  Total Bilirubin 0.3 - 1.2 mg/dL - - -  Alkaline Phos 38 - 126 U/L - - -  AST 15 - 41 U/L - - -  ALT 0 - 44 U/L - - -   CBC Latest Ref Rng & Units 06/30/2020 06/29/2020 06/28/2020  WBC 4.0 - 10.5 K/uL 13.7(H) 17.2(H) 14.6(H)  Hemoglobin 13.0 - 17.0 g/dL 12.9(L) 12.9(L) 11.6(L)  Hematocrit 39 - 52 % 40.0 39.9 36.2(L)  Platelets 150 - 400 K/uL 239 222 183   Lipid Panel No results for input(s): CHOL, TRIG, LDLCALC, VLDL, HDL, CHOLHDL, LDLDIRECT in the last 8760 hours.  HEMOGLOBIN A1C Lab Results  Component Value Date   HGBA1C 5.5 06/23/2020   MPG 111.15 06/23/2020   TSH Recent Labs    01/17/20 0813  TSH 1.564   BNP (last 3 results) No results for input(s): BNP in the last 8760 hours.  Medications and allergies   Allergies  Allergen Reactions  . Amoxicillin Nausea And Vomiting    No current facility-administered medications on file prior to encounter.   Current Outpatient Medications on File Prior to Encounter  Medication Sig Dispense Refill  . amiodarone (PACERONE) 200 MG tablet Take 1 tablet (200 mg total) by mouth daily. 60 tablet 2  . aspirin EC 81 MG tablet Take 1 tablet (81 mg total) by mouth daily. 90 tablet 3  . atorvastatin (LIPITOR) 80 MG tablet Take 1 tablet (80 mg total) by mouth daily. 30 tablet 2  . benazepril (LOTENSIN) 20 MG tablet Take 20 mg by mouth daily.     Marland Kitchen diltiazem  (CARDIZEM CD) 240 MG 24 hr capsule TAKE 1 CAPSULE (240 MG TOTAL) BY MOUTH DAILY. 30 capsule 3  . nitroGLYCERIN (NITROSTAT) 0.4 MG SL tablet Place 1 tablet (0.4 mg total) under the tongue every 5 (five) minutes as needed for chest pain. If you require more than two tablets five minutes apart go to the nearest ER via EMS. 30 tablet 0  . RABEprazole (ACIPHEX) 20 MG tablet Take 20 mg by mouth daily.     Carlena Hurl 20 MG TABS tablet Take 20 mg by mouth daily.       Scheduled Meds: . acetaminophen  1,000 mg Oral Q6H   Or  . acetaminophen (TYLENOL) oral liquid 160 mg/5 mL  1,000 mg Per Tube Q6H  . amiodarone  200 mg Oral BID  . aspirin EC  81 mg Oral Daily   Or  . aspirin  81 mg Per Tube Daily  . atorvastatin  80 mg Oral Daily  . bisacodyl  10 mg Oral Daily   Or  . bisacodyl  10 mg Rectal Daily  . Chlorhexidine Gluconate Cloth  6 each Topical Daily  . digoxin  0.125 mg Intravenous Daily  . diltiazem  30 mg Oral Q6H  . docusate sodium  200 mg Oral Daily  . furosemide  40 mg Oral Daily  . isosorbide mononitrate  15 mg Oral Daily  . mouth rinse  15 mL Mouth Rinse BID  . metoprolol tartrate  37.5 mg Oral BID   Or  . metoprolol tartrate  37.5 mg Per Tube BID  . mupirocin ointment   Nasal BID  . pantoprazole  40 mg Oral Daily  . rivaroxaban  20 mg Oral Q supper  . sodium chloride flush  10-40 mL Intracatheter Q12H  . sodium chloride flush  10-40 mL Intracatheter Q12H  . sodium chloride flush  3 mL Intravenous Q12H  Continuous Infusions: . sodium chloride    . lactated ringers 20 mL/hr at 06/27/20 1600   PRN Meds:.sodium chloride, diphenhydrAMINE, lactulose, metoprolol tartrate, ondansetron (ZOFRAN) IV, oxyCODONE, simethicone, sodium chloride flush, sodium chloride flush, sodium chloride flush, traMADol   I/O last 3 completed shifts: In: 100 [I.V.:100] Out: 200 [Urine:200] No intake/output data recorded.    Radiology:   DG Chest 2 View  Result Date: 06/30/2020 CLINICAL DATA:   Post CABG EXAM: CHEST - 2 VIEW COMPARISON:  06/29/2020 FINDINGS: Changes of CABG. Cardiomegaly. Left base atelectasis. Right lung clear. No effusion or pneumothorax. IMPRESSION: Postoperative changes.  Left base atelectasis. Electronically Signed   By: Charlett Nose M.D.   On: 06/30/2020 08:20   DG Chest Port 1 View  Result Date: 06/29/2020 CLINICAL DATA:  Chest tube removal. Recent coronary artery bypass grafting EXAM: PORTABLE CHEST 1 VIEW COMPARISON:  June 28, 2020 FINDINGS: Swan-Ganz catheter has been removed. Cordis tip is in the superior vena cava. Left chest tube has been removed. No appreciable pneumothorax. There is ill-defined airspace opacity in the left base. Lungs elsewhere are clear. There is stable cardiomegaly with pulmonary vascularity within normal limits. Patient is status post coronary artery bypass grafting. Left atrial appendage clamp present. No adenopathy. No bone lesions. IMPRESSION: Cordis tip in superior vena cava. No pneumothorax. Left base airspace opacity concerning for atelectasis and potential superimposed pneumonia. Lungs elsewhere clear. Stable cardiac enlargement. Postoperative changes noted. Electronically Signed   By: Bretta Bang III M.D.   On: 06/29/2020 08:15   [place] EKG SITE RITE  Result Date: 06/30/2020 If Site Rite image not attached, placement could not be confirmed due to current cardiac rhythm.   Cardiac Studies:   Echocardiogram: 02/29/2020: LVEF 55-60%, mildly dilated left ventricular cavity, mild LVH, normal diastolic filling pattern, normal left atrial pressure, mildly dilated left atrium, mild MR, trace TR.  Stress Testing: Lexiscan Sestamibi stress test 02/29/2020: Stress EKG at 78% MPHR shows sinus tachycardia, nonspecific T wave inversion in inferior leads, frequent PVC's.  SPECT images show large sized, medium intensity, predominantly reversible perfusion defect in basal to apical, inferoseptal, inferior, and inferolateral myocadium.  While study quality may be limited due to attenuation (BMI 52), ischemia cannot be excluded. Stress LVEF 65%. Recommend clinical correlation. High risk study.   Vascular imaging: Ultrasound venous duplex: 03/07/2019: Right: Findings consistent with acute deep vein thrombosis involving the right popliteal vein. There is evidence of superficial vein thrombosis involving a varicose vein of the proximal, medial, right calf.  Left: No evidence of common femoral vein obstruction.  Left Heart Catheterization 04/19/20: LV: Moderately elevated LVEDP.  LM: Mid 40%. Hazy. Severe diffuse multivessel disease. Ectasia of the LAD, RCA. Prox LAD aneurysmal. Occluded after origin of a very large D1. D1 is very large with mid 90% stenosis. Ipsilateral and contralateral (RCA) to mid LAD noted.  Cx: Prox 70-80% stenosis involves the bifurcation of large OM-1. At the bifurcation there is at least a 80% stenosis. RCA: Diffusely ectatic. PL branch has a focal 50% stenosis. Slow flow noted in the RCA.   Rec: Complex multivessel CAD and will benefit from CABG to LAD and D1 and OM1 with arterial conduits. Maze and LAA ligation. Patient went into atrial fibrillation in the cath lab. 70 mL contrast used. IV dilt and metoprolol given but did not convert to sinus. He will resume Xarelto tonight. Patient wants to come in and discuss personally at the office. Not happy about CABG potential.  EKG:  06/30/2020: Atypical atrial  flutter with 2: 1 conduction with ventricular rate of 145 bpm, nonspecific T abnormality.  Low-voltage complexes.  Assessment  1.  Atypical atrial flutter with 2: 1 conduction, sustained 2.  Coronary artery disease native vessel with stable angina pectoris, multivessel disease 3.  CABG x2 with LIMA to LAD and radial graft to D1 and left atrial appendage ligation.  06/27/2020. 4.  Morbid obesity Recommendations:   Patient is presently doing well in spite of his comorbidity especially morbid  obesity he has recuperated well from cardiac standpoint and has done well post CABG surgery.  He now has developed incessant and persistent a flutter with RVR.  Agree with starting IV amiodarone and digitizing the patient, he is presently on metoprolol tartrate 37.5 mg p.o. twice daily.  Blood pressure is soft but suspect blood pressure will improve with rate control, given his normal LVEF, I will also restart diltiazem but short acting at 30 mg every 6 hours parameters to hold were given to the nursing staff.  Best option is to proceed with TEE guided direct-current cardioversion.  I will discuss with the patient regarding risks benefits including respiratory complications from the procedure and is willing to proceed.  He is presently on Xarelto, continue same.   Alleya Demeter, MD, FACC 06/30/2020, 9:25 PM Office: [phone]  

## 2020-06-30 NOTE — Progress Notes (Signed)
PT Cancellation Note  Patient Details Name: [place] E Driggers MRN: [id number] DOB: July 22, 1976   Cancelled Treatment:    Reason Eval/Treat Not Completed: Medical issues which prohibited therapy  Pt with HR in the 140's.  Will f/u as able Anise Salvo, PT Acute Rehab Services Pager [phone] [facility] [phone]   Rayetta Humphrey 06/30/2020, 1:11 PM

## 2020-06-30 NOTE — Consult Note (Signed)
CARDIOLOGY CONSULT NOTE  Patient ID: Joseph Lang MRN: [id number] DOB/AGE: [birth year] 46 y.o.  Admit date: 06/27/2020 Referring Physician  Ed. Tyrone SageGerhardt, MD Primary Physician:  Georgann HousekeeperHusain, Karrar, MD Reason for Consultation  A. Fib with RVR  Patient ID: Joseph Lang, male    DOB: [birth year], 44 y.o.   MRN: [id number]  CC: A. Fibrillation  HPI:    Joseph Lang  is a 44 y.o. Caucasian  male patient with past medical history of paroxysmal atrial fibrillation and cardioversion on 01/17/2020, hypertension, morbid obesity, obstructive sleep apnea on CPAP, tobacco use disorder, history of acute deep vein thrombosis and pulmonary embolism on 04/08/2015 following gastric sleeve resection 6 weeks prior, and again presented with spontaneous right leg DVT on 03/07/2019 presently on long term Xarelto, underwent cardiac catheterization for abnormal stress test and new onset atrial fibrillation on 04/19/2020 revealing multivessel disease, patient went back into A. fib with RVR during cardiac catheterization.    He underwent repeat direct-current cardioversion on 04/26/2020 which was successful.  He has been evaluated by Dr. Elise BenneEd Gerhart and underwent CABG x 2 and LAA ligation on 06/27/2020. Post operativel y, he recuperated well and extubated without complications, but then developed atrial fibrillation with RVR. Has had low BP and patient although stable has not advanced with regards to cardiac rehab and ambulation due to elevated HR. I was consulted to manage A. Fib with RVR.  Patient states that he is presently doing well and is ready to mobilize and increase his physical activity.  He has not had any chest pain or dyspnea.  States that since he went into atrial fibrillation he feels his heart is racing.  Past Medical History:  Diagnosis Date  . Coronary artery disease 2021  . Dysrhythmia 2021   Afib  . GERD (gastroesophageal reflux disease)   . Hx of deep vein thrombophlebitis of lower extremity   . Hx of  gastric bypass   . Hx of pulmonary embolus   . Hypertension   . Obesity   . PONV (postoperative nausea and vomiting)    Throwing up after wt loss surgery  . S/P CABG x 2 06/28/2020: LIMA to LAD, Free Radial graft to D1. LAA ligation with 40 mm Atricure. 06/27/2020  . Sleep apnea   . Tobacco abuse    Past Surgical History:  Procedure Laterality Date  . CARDIAC CATHETERIZATION Left 2021  . CARDIOVERSION    . CARDIOVERSION N/A 04/19/2020   Procedure: CARDIOVERSION;  Surgeon: Yates DecampGanji, Bryssa Tones, MD;  [facility];  Service: Cardiovascular;  Laterality: N/A;  . CARDIOVERSION N/A 04/26/2020   Procedure: CARDIOVERSION;  Surgeon: Yates DecampGanji, Kymia Simi, MD;  [facility];  Service: Cardiovascular;  Laterality: N/A;  . CLIPPING OF ATRIAL APPENDAGE N/A 06/27/2020   Procedure: CLIPPING OF ATRIAL APPENDAGE USING ATRICURE CLIP SIZE 40;  Surgeon: Delight OvensGerhardt, Edward B, MD;  Location: [facility] OR;  Service: Open Heart Surgery;  Laterality: N/A;  . CORONARY ARTERY BYPASS GRAFT N/A 06/27/2020   Procedure: CORONARY ARTERY BYPASS GRAFTING (CABG) X TWO, USING LEFT INTERNAL MAMMARY ARTERY AND LEFT ARM RADIAL ARTERY;  Surgeon: Delight OvensGerhardt, Edward B, MD;  Location: [facility] OR;  Service: Open Heart Surgery;  Laterality: N/A;  . FRACTURE SURGERY  1995   pelvis  . HIP SURGERY    . LAPAROSCOPIC GASTRIC SLEEVE RESECTION N/A 02/14/2015   Procedure: LAPAROSCOPIC GASTRIC SLEEVE RESECTION WITH UPPER ENDOSCOPY;  Surgeon: Luretha MurphyMatthew Martin, MD;  Location: [facility] ORS;  Service: General;  Laterality: N/A;  . LEFT HEART CATH  AND CORONARY ANGIOGRAPHY N/A 04/19/2020   Procedure: LEFT HEART CATH AND CORONARY ANGIOGRAPHY;  Surgeon: Yates Decamp, MD;  Location: [facility] INVASIVE CV LAB;  Service: Cardiovascular;  Laterality: N/A;  . PELVIS CLOSED REDUCTION    . RADIAL ARTERY HARVEST Left 06/27/2020   Procedure: LEFT RADIAL ARTERY HARVEST;  Surgeon: Delight Ovens, MD;  Location[facility] OR;  Service: Open Heart Surgery;  Laterality: Left;  . TEE WITHOUT CARDIOVERSION  N/A 06/27/2020   Procedure: TRANSESOPHAGEAL ECHOCARDIOGRAM (TEE);  Surgeon: Delight Ovens, MD;  Location: [facility] OR;  Service: Open Heart Surgery;  Laterality: N/A;   Social History   Tobacco Use  . Smoking status: Former Smoker    Packs/day: 0.50    Years: 28.00    Pack years: 14.00    Types: Cigarettes    Quit date: 04/20/2020    Years since quitting: 0.1  . Smokeless tobacco: Never Used  . Tobacco comment: half a pack daily  Substance Use Topics  . Alcohol use: Not Currently    Marital Sttus: Single  ROS  Review of Systems  Cardiovascular: Positive for chest pain (at the surgical site) and palpitations. Negative for dyspnea on exertion and leg swelling.  Gastrointestinal: Negative for melena.  All other systems reviewed and are negative.  Objective   Vitals with BMI 06/30/2020 06/30/2020 06/30/2020  Height - - -  Weight - - -  BMI - - -  Systolic 108 88 98  Diastolic 60 59 45  Pulse 146 140 146    Blood pressure 108/60, pulse (!) 146, temperature 98.6 F (37 C), temperature source Oral, resp. rate (!) 27, height 6\' 2"  (1.88 m), weight (!) 171.2 kg, SpO2 91 %.    Physical Exam Constitutional:      Comments: Morbidly obese in no acute distress  Cardiovascular:     Rate and Rhythm: Tachycardia present. Rhythm irregularly irregular.     Pulses: Normal pulses and intact distal pulses.     Heart sounds: No murmur heard.  No gallop. No S3 or S4 sounds.      Comments: S1 is variable, S2 is normal. No JVD, No leg edema. Pulmonary:     Effort: Pulmonary effort is normal.     Breath sounds: Normal breath sounds.  Abdominal:     General: Bowel sounds are normal.     Palpations: Abdomen is soft.     Comments: Pannus present  Musculoskeletal:        General: Normal range of motion.    Laboratory examination:   Recent Labs    06/28/20 1700 06/29/20 0400 06/30/20 0500  NA 137 139 139  K 3.7 3.9 3.4*  CL 104 106 105  CO2 26 24 27   GLUCOSE 106* 111* 135*  BUN 7 8 5*   CREATININE 0.68 0.85 0.78  CALCIUM 9.2 9.7 9.5  GFRNONAA >60 >60 >60  GFRAA >60 >60 >60   estimated creatinine clearance is 192.2 mL/min (by C-G formula based on SCr of 0.78 mg/dL).  CMP Latest Ref Rng & Units 06/30/2020 06/29/2020 06/28/2020  Glucose 70 - 99 mg/dL 07/01/2020) 06/30/2020) 324(M)  BUN 6 - 20 mg/dL 5(L) 8 7  Creatinine 010(U - 1.24 mg/dL 725(D 6.64 4.03  Sodium 135 - 145 mmol/L 139 139 137  Potassium 3.5 - 5.1 mmol/L 3.4(L) 3.9 3.7  Chloride 98 - 111 mmol/L 105 106 104  CO2 22 - 32 mmol/L 27 24 26   Calcium 8.9 - 10.3 mg/dL 9.5 9.7 9.2  Total Protein 6.5 -  8.1 g/dL - - -  Total Bilirubin 0.3 - 1.2 mg/dL - - -  Alkaline Phos 38 - 126 U/L - - -  AST 15 - 41 U/L - - -  ALT 0 - 44 U/L - - -   CBC Latest Ref Rng & Units 06/30/2020 06/29/2020 06/28/2020  WBC 4.0 - 10.5 K/uL 13.7(H) 17.2(H) 14.6(H)  Hemoglobin 13.0 - 17.0 g/dL 12.9(L) 12.9(L) 11.6(L)  Hematocrit 39 - 52 % 40.0 39.9 36.2(L)  Platelets 150 - 400 K/uL 239 222 183   Lipid Panel No results for input(s): CHOL, TRIG, LDLCALC, VLDL, HDL, CHOLHDL, LDLDIRECT in the last 8760 hours.  HEMOGLOBIN A1C Lab Results  Component Value Date   HGBA1C 5.5 06/23/2020   MPG 111.15 06/23/2020   TSH Recent Labs    01/17/20 0813  TSH 1.564   BNP (last 3 results) No results for input(s): BNP in the last 8760 hours.  Medications and allergies   Allergies  Allergen Reactions  . Amoxicillin Nausea And Vomiting    No current facility-administered medications on file prior to encounter.   Current Outpatient Medications on File Prior to Encounter  Medication Sig Dispense Refill  . amiodarone (PACERONE) 200 MG tablet Take 1 tablet (200 mg total) by mouth daily. 60 tablet 2  . aspirin EC 81 MG tablet Take 1 tablet (81 mg total) by mouth daily. 90 tablet 3  . atorvastatin (LIPITOR) 80 MG tablet Take 1 tablet (80 mg total) by mouth daily. 30 tablet 2  . benazepril (LOTENSIN) 20 MG tablet Take 20 mg by mouth daily.     Marland Kitchen diltiazem  (CARDIZEM CD) 240 MG 24 hr capsule TAKE 1 CAPSULE (240 MG TOTAL) BY MOUTH DAILY. 30 capsule 3  . nitroGLYCERIN (NITROSTAT) 0.4 MG SL tablet Place 1 tablet (0.4 mg total) under the tongue every 5 (five) minutes as needed for chest pain. If you require more than two tablets five minutes apart go to the nearest ER via EMS. 30 tablet 0  . RABEprazole (ACIPHEX) 20 MG tablet Take 20 mg by mouth daily.     Carlena Hurl 20 MG TABS tablet Take 20 mg by mouth daily.       Scheduled Meds: . acetaminophen  1,000 mg Oral Q6H   Or  . acetaminophen (TYLENOL) oral liquid 160 mg/5 mL  1,000 mg Per Tube Q6H  . amiodarone  200 mg Oral BID  . aspirin EC  81 mg Oral Daily   Or  . aspirin  81 mg Per Tube Daily  . atorvastatin  80 mg Oral Daily  . bisacodyl  10 mg Oral Daily   Or  . bisacodyl  10 mg Rectal Daily  . Chlorhexidine Gluconate Cloth  6 each Topical Daily  . digoxin  0.125 mg Intravenous Daily  . diltiazem  30 mg Oral Q6H  . docusate sodium  200 mg Oral Daily  . furosemide  40 mg Oral Daily  . isosorbide mononitrate  15 mg Oral Daily  . mouth rinse  15 mL Mouth Rinse BID  . metoprolol tartrate  37.5 mg Oral BID   Or  . metoprolol tartrate  37.5 mg Per Tube BID  . mupirocin ointment   Nasal BID  . pantoprazole  40 mg Oral Daily  . rivaroxaban  20 mg Oral Q supper  . sodium chloride flush  10-40 mL Intracatheter Q12H  . sodium chloride flush  10-40 mL Intracatheter Q12H  . sodium chloride flush  3 mL Intravenous Q12H  Continuous Infusions: . sodium chloride    . lactated ringers 20 mL/hr at 06/27/20 1600   PRN Meds:.sodium chloride, diphenhydrAMINE, lactulose, metoprolol tartrate, ondansetron (ZOFRAN) IV, oxyCODONE, simethicone, sodium chloride flush, sodium chloride flush, sodium chloride flush, traMADol   I/O last 3 completed shifts: In: 100 [I.V.:100] Out: 200 [Urine:200] No intake/output data recorded.    Radiology:   DG Chest 2 View  Result Date: 06/30/2020 CLINICAL DATA:   Post CABG EXAM: CHEST - 2 VIEW COMPARISON:  06/29/2020 FINDINGS: Changes of CABG. Cardiomegaly. Left base atelectasis. Right lung clear. No effusion or pneumothorax. IMPRESSION: Postoperative changes.  Left base atelectasis. Electronically Signed   By: Charlett Nose M.D.   On: 06/30/2020 08:20   DG Chest Port 1 View  Result Date: 06/29/2020 CLINICAL DATA:  Chest tube removal. Recent coronary artery bypass grafting EXAM: PORTABLE CHEST 1 VIEW COMPARISON:  June 28, 2020 FINDINGS: Swan-Ganz catheter has been removed. Cordis tip is in the superior vena cava. Left chest tube has been removed. No appreciable pneumothorax. There is ill-defined airspace opacity in the left base. Lungs elsewhere are clear. There is stable cardiomegaly with pulmonary vascularity within normal limits. Patient is status post coronary artery bypass grafting. Left atrial appendage clamp present. No adenopathy. No bone lesions. IMPRESSION: Cordis tip in superior vena cava. No pneumothorax. Left base airspace opacity concerning for atelectasis and potential superimposed pneumonia. Lungs elsewhere clear. Stable cardiac enlargement. Postoperative changes noted. Electronically Signed   By: Bretta Bang III M.D.   On: 06/29/2020 08:15   [place] EKG SITE RITE  Result Date: 06/30/2020 If Site Rite image not attached, placement could not be confirmed due to current cardiac rhythm.   Cardiac Studies:   Echocardiogram: 02/29/2020: LVEF 55-60%, mildly dilated left ventricular cavity, mild LVH, normal diastolic filling pattern, normal left atrial pressure, mildly dilated left atrium, mild MR, trace TR.  Stress Testing: Lexiscan Sestamibi stress test 02/29/2020: Stress EKG at 78% MPHR shows sinus tachycardia, nonspecific T wave inversion in inferior leads, frequent PVC's.  SPECT images show large sized, medium intensity, predominantly reversible perfusion defect in basal to apical, inferoseptal, inferior, and inferolateral myocadium.  While study quality may be limited due to attenuation (BMI 52), ischemia cannot be excluded. Stress LVEF 65%. Recommend clinical correlation. High risk study.   Vascular imaging: Ultrasound venous duplex: 03/07/2019: Right: Findings consistent with acute deep vein thrombosis involving the right popliteal vein. There is evidence of superficial vein thrombosis involving a varicose vein of the proximal, medial, right calf.  Left: No evidence of common femoral vein obstruction.  Left Heart Catheterization 04/19/20: LV: Moderately elevated LVEDP.  LM: Mid 40%. Hazy. Severe diffuse multivessel disease. Ectasia of the LAD, RCA. Prox LAD aneurysmal. Occluded after origin of a very large D1. D1 is very large with mid 90% stenosis. Ipsilateral and contralateral (RCA) to mid LAD noted.  Cx: Prox 70-80% stenosis involves the bifurcation of large OM-1. At the bifurcation there is at least a 80% stenosis. RCA: Diffusely ectatic. PL branch has a focal 50% stenosis. Slow flow noted in the RCA.   Rec: Complex multivessel CAD and will benefit from CABG to LAD and D1 and OM1 with arterial conduits. Maze and LAA ligation. Patient went into atrial fibrillation in the cath lab. 70 mL contrast used. IV dilt and metoprolol given but did not convert to sinus. He will resume Xarelto tonight. Patient wants to come in and discuss personally at the office. Not happy about CABG potential.  EKG:  06/30/2020: Atypical atrial  flutter with 2: 1 conduction with ventricular rate of 145 bpm, nonspecific T abnormality.  Low-voltage complexes.  Assessment  1.  Atypical atrial flutter with 2: 1 conduction, sustained 2.  Coronary artery disease native vessel with stable angina pectoris, multivessel disease 3.  CABG x2 with LIMA to LAD and radial graft to D1 and left atrial appendage ligation.  06/27/2020. 4.  Morbid obesity Recommendations:   Patient is presently doing well in spite of his comorbidity especially morbid  obesity he has recuperated well from cardiac standpoint and has done well post CABG surgery.  He now has developed incessant and persistent a flutter with RVR.  Agree with starting IV amiodarone and digitizing the patient, he is presently on metoprolol tartrate 37.5 mg p.o. twice daily.  Blood pressure is soft but suspect blood pressure will improve with rate control, given his normal LVEF, I will also restart diltiazem but short acting at 30 mg every 6 hours parameters to hold were given to the nursing staff.  Best option is to proceed with TEE guided direct-current cardioversion.  I will discuss with the patient regarding risks benefits including respiratory complications from the procedure and is willing to proceed.  He is presently on Xarelto, continue same.   Yates Decamp, MD, [facility] 06/30/2020, 9:25 PM Office: [phone]

## 2020-06-30 NOTE — Progress Notes (Signed)
Mobility Specialist - Progress Note   06/30/20 1427  Mobility  Activity Contraindicated/medical hold   Pt's HR currently at 148 at rest, will f/u as able.   Mamie Levers Mobility Specialist Mobility Specialist Phone: [phone]

## 2020-06-30 NOTE — Progress Notes (Addendum)
  Amiodarone Drug - Drug Interaction Consult Note  Recommendations:  Monitor for muscle pain, weakness while on atorvastatin  Monitor digoxin levels and adjust digoxin dose as needed while on amiodarone (pt was on amiodarone PTA and started digoxin while inpatient; dose of digoxin may need to be reduced while on concurrent amiodarone)  Monitor for bradycardia, AV block, myocardial depression while on metoprolol  Monitor and replace K as needed while on furosemide  Monitor anticoagulation closely while on Xarelto  Monitor for QTc prolongation while on ondansetron  Amiodarone is metabolized by the cytochrome P450 system and therefore has the potential to cause many drug interactions. Amiodarone has an average plasma half-life of 50 days (range 20 to 100 days).   There is potential for drug interactions to occur several weeks or months after stopping treatment and the onset of drug interactions may be slow after initiating amiodarone.   [x]  Statins: Increased risk of myopathy. Simvastatin- restrict dose to 20mg  daily. Other statins: counsel patients to report any muscle pain or weakness immediately.  [x]  Anticoagulants: Amiodarone can increase anticoagulant effect. Consider warfarin dose reduction. Patients should be monitored closely and the dose of anticoagulant altered accordingly, remembering that amiodarone levels take several weeks to stabilize.  []  Antiepileptics: Amiodarone can increase plasma concentration of phenytoin, the dose should be reduced. Note that small changes in phenytoin dose can result in large changes in levels. Monitor patient and counsel on signs of toxicity.  [x]  Beta blockers: increased risk of bradycardia, AV block and myocardial depression. Sotalol - avoid concomitant use.  []   Calcium channel blockers (diltiazem and verapamil): increased risk of bradycardia, AV block and myocardial depression.  []   Cyclosporine: Amiodarone increases levels of cyclosporine.  Reduced dose of cyclosporine is recommended.  [x]  Digoxin dose should be halved when amiodarone is started. (Pt was on amiodarone PTA and started digoxin while inpt.)  [x]  Diuretics: increased risk of cardiotoxicity if hypokalemia occurs.  []  Oral hypoglycemic agents (glyburide, glipizide, glimepiride): increased risk of hypoglycemia. Patient's glucose levels should be monitored closely when initiating amiodarone therapy.   [x]  Drugs that prolong the QT interval:  Torsades de pointes risk may be increased with concurrent use - avoid if possible.  Monitor QTc, also keep magnesium/potassium WNL if concurrent therapy can't be avoided. Antibiotics: e.g. fluoroquinolones, erythromycin. . Antiarrhythmics: e.g. quinidine, procainamide, disopyramide, sotalol. . Antipsychotics: e.g. phenothiazines, haloperidol.  . Lithium, tricyclic antidepressants, and methadone.  Ondansetron   , PharmD, BCPS, [facility] Clinical Pharmacist 06/30/2020 5:57 PM

## 2020-06-30 NOTE — Progress Notes (Signed)
Contacted Hendrickson MD x 2 for patients persistent HR in the 130's-140's. Made aware that patient has received metoprolol 5mg  x 2 IV and 37.5 mg orally.   New verbal orders: administer an additional 5mg  of metoprolol and obtain an EKG if heart rate stays in the 130's-140's.   Will continue to monitor.

## 2020-06-30 NOTE — Progress Notes (Signed)
OT Cancellation Note  Patient Details Name: Joseph Lang MRN: [id number] DOB: 1975-12-15   Cancelled Treatment:    Reason Eval/Treat Not Completed: Medical issues which prohibited therapy. Pt with sustained tachycardia at rest. HR in the low 140s with pt sitting in recliner at rest. Plan to reattempt at a later time/date.  Raynald Kemp, OT Acute Rehabilitation Services Pager: [phone] Office: [phone]  06/30/2020, 9:52 AM

## 2020-06-30 NOTE — Progress Notes (Addendum)
[address]       [place] [address]             [phone]      3 Days Post-Op Procedure(s) (LRB): CORONARY ARTERY BYPASS GRAFTING (CABG) X TWO, USING LEFT INTERNAL MAMMARY ARTERY AND LEFT ARM RADIAL ARTERY (N/A) LEFT RADIAL ARTERY HARVEST (Left) CLIPPING OF ATRIAL APPENDAGE USING ATRICURE CLIP SIZE 40 (N/A) TRANSESOPHAGEAL ECHOCARDIOGRAM (TEE) (N/A)   Subjective:  Patient again states not doing well.  He states his heart is just pounding out of his chest.  He states he walked well yesterday, but then his heart rate was up and took awhile to come back down.  He was able to move his bowels.  Objective: Vital signs in last 24 hours: Temp:  [97.6 F (36.4 C)-98.7 F (37.1 C)] 97.9 F (36.6 C) (09/16 0312) Pulse Rate:  [32-140] 140 (09/16 0514) Cardiac Rhythm: Sinus tachycardia (09/15 1900) Resp:  [13-34] 27 (09/16 0514) BP: (96-145)/(60-115) 112/76 (09/16 0514) SpO2:  [87 %-97 %] 91 % (09/16 0514) Weight:  [171.2 kg] 171.2 kg (09/16 0451)  Intake/Output from previous day: 09/15 0701 - 09/16 0700 In: 0  Out: 200 [Urine:200]  General appearance: alert, cooperative and no distress Heart: irregularly irregular rhythm Lungs: diminished breath sounds bibasilar Abdomen: soft, non-tender; bowel sounds normal; no masses,  no organomegaly Extremities: edema trace Wound: clean and dry  Lab Results: Recent Labs    06/29/20 0400 06/30/20 0500  WBC 17.2* 13.7*  HGB 12.9* 12.9*  HCT 39.9 40.0  PLT 222 239   BMET:  Recent Labs    06/29/20 0400 06/30/20 0500  NA 139 139  K 3.9 3.4*  CL 106 105  CO2 24 27  GLUCOSE 111* 135*  BUN 8 5*  CREATININE 0.85 0.78  CALCIUM 9.7 9.5    PT/INR:  Recent Labs    06/27/20 2023  LABPROT 15.2  INR 1.2   ABG    Component Value Date/Time   PHART 7.362 06/27/2020 2100   HCO3 23.2 06/27/2020 2100   TCO2 24 06/27/2020 2100   ACIDBASEDEF 2.0 06/27/2020 2100   O2SAT 95.0 06/27/2020 2100   CBG (last 3)    Recent Labs    06/29/20 0418 06/29/20 0817 06/29/20 1137  GLUCAP 108* 97 155*    Assessment/Plan: S/P Procedure(s) (LRB): CORONARY ARTERY BYPASS GRAFTING (CABG) X TWO, USING LEFT INTERNAL MAMMARY ARTERY AND LEFT ARM RADIAL ARTERY (N/A) LEFT RADIAL ARTERY HARVEST (Left) CLIPPING OF ATRIAL APPENDAGE USING ATRICURE CLIP SIZE 40 (N/A) TRANSESOPHAGEAL ECHOCARDIOGRAM (TEE) (N/A)  1. CV- NSR with PVCs, PAF at times ( previous history)- will give IV Amiodarone bolus, continue Lopressor at 37.5 mg BID, will increase Amiodarone tablet to BID dosing, would benefit from resumption of Cardizem, but his BP is too low... continue Xarelto 2. Pulm- small left pleural effusion, atelectasis, continue IS, off oxygen 3. Renal- creatinine WNL, weight is mildly elevated, small effusion on CXR will start oral Lasix 40 mg BID, supplement K which was 3.4 this morning 4. GI- issues resolved, bowels moving, continue prn 5. Dispo- patient with elevated HR, A. Fib at times, will treat with Amiodarone bolus, increase oral regimen, if BP improves will restart Cardizem, diurese   LOS: 3 days    Lowella Dandy, PA-C 06/30/2020  re bolus Cordarone Place pic line today and get central line out  I have seen and examined [place] E Davia and agree with the above assessment  and plan.  Ramon Dredge  Bari Betta Balla MD Beeper [phone] Office [phone] 06/30/2020 1:00 PM

## 2020-06-30 NOTE — Progress Notes (Signed)
Pt HR has been consistently 142 this am, sometimes afib, sometimes ST. Got to 180-200 briefly when he went to BR. Will hold ambulation for now.  Ethelda Chick CES, ACSM 11:33 AM 06/30/2020

## 2020-06-30 NOTE — Progress Notes (Signed)
remains  in Aflutter tonight , BP soft PIC line in place  Will re bolus with Cordarone Cardiology- Jacinto Halim et al consulted for consideration of cardioversion, atrial clip in place and back on anticoagulation pacing wires are out  Consider Cardizem drip next  Delight Ovens MD      [address] [place] [address] Office [phone]

## 2020-06-30 NOTE — Progress Notes (Signed)
Peripherally Inserted Central Catheter Placement  The IV Nurse has discussed with the patient and/or persons authorized to consent for the patient, the purpose of this procedure and the potential benefits and risks involved with this procedure.  The benefits include less needle sticks, lab draws from the catheter, and the patient may be discharged home with the catheter. Risks include, but not limited to, infection, bleeding, blood clot (thrombus formation), and puncture of an artery; nerve damage and irregular heartbeat and possibility to perform a PICC exchange if needed/ordered by physician.  Alternatives to this procedure were also discussed.  Bard Power PICC patient education guide, fact sheet on infection prevention and patient information card has been provided to patient /or left at bedside.    PICC Placement Documentation  PICC Single Lumen 06/30/20 PICC Right Basilic 53 cm 1 cm (Active)  Indication for Insertion or Continuance of Line Vasoactive infusions 06/30/20 1135  Exposed Catheter (cm) 1 cm 06/30/20 1135  Site Assessment Clean;Dry;Intact 06/30/20 1135  Line Status Flushed;Saline locked;Blood return noted 06/30/20 1135  Dressing Type Transparent 06/30/20 1135  Dressing Status Clean;Dry;Intact;Antimicrobial disc in place 06/30/20 1135  Safety Lock Not Applicable 06/30/20 1135  Dressing Intervention New dressing 06/30/20 1135  Dressing Change Due 07/07/20 06/30/20 1135       Ethelda Chick 06/30/2020, 11:36 AM

## 2020-07-01 ENCOUNTER — Encounter (HOSPITAL_COMMUNITY): Payer: Self-pay | Admitting: Cardiothoracic Surgery

## 2020-07-01 ENCOUNTER — Inpatient Hospital Stay (HOSPITAL_COMMUNITY): Payer: BC Managed Care – PPO

## 2020-07-01 ENCOUNTER — Inpatient Hospital Stay (HOSPITAL_COMMUNITY): Payer: BC Managed Care – PPO | Admitting: Anesthesiology

## 2020-07-01 ENCOUNTER — Encounter (HOSPITAL_COMMUNITY)
Admission: RE | Disposition: A | Discharge status: Home / Self Care | Payer: Self-pay | Source: Home / Self Care | Attending: Cardiothoracic Surgery

## 2020-07-01 HISTORY — PX: CARDIOVERSION: SHX1299

## 2020-07-01 HISTORY — PX: TEE WITHOUT CARDIOVERSION: SHX5443

## 2020-07-01 LAB — BASIC METABOLIC PANEL
Anion gap: 8 (ref 5–15)
BUN: 6 mg/dL (ref 6–20)
CO2: 26 mmol/L (ref 22–32)
Calcium: 9.2 mg/dL (ref 8.9–10.3)
Chloride: 106 mmol/L (ref 98–111)
Creatinine, Ser: 0.75 mg/dL (ref 0.61–1.24)
GFR calc Af Amer: >60 mL/min (ref >60)
GFR calc non Af Amer: >60 mL/min (ref >60)
Glucose, Bld: 112 mg/dL — ABNORMAL HIGH (ref 70–99)
Potassium: 3.2 mmol/L — ABNORMAL LOW (ref 3.5–5.1)
Sodium: 140 mmol/L (ref 135–145)

## 2020-07-01 LAB — PROTIME-INR
INR: 2 — ABNORMAL HIGH (ref 0.8–1.2)
Prothrombin Time: 22.2 seconds — ABNORMAL HIGH (ref 11.4–15.2)

## 2020-07-01 LAB — SURGICAL PCR SCREEN
MRSA, PCR: POSITIVE — AB
Staphylococcus aureus: POSITIVE — AB

## 2020-07-01 LAB — MAGNESIUM: Magnesium: 2 mg/dL (ref 1.7–2.4)

## 2020-07-01 SURGERY — CARDIOVERSION
Anesthesia: General

## 2020-07-01 MED ORDER — METOPROLOL TARTRATE 5 MG/5ML IV SOLN
5.0000 mg | Freq: Once | INTRAVENOUS | Status: AC
  Administered 2020-07-01: 5 mg via INTRAVENOUS

## 2020-07-01 MED ORDER — DIGOXIN 0.25 MG/ML IJ SOLN
0.2500 mg | Freq: Every day | INTRAMUSCULAR | Status: DC
  Administered 2020-07-01 – 2020-07-03 (×3): 0.25 mg via INTRAVENOUS
  Filled 2020-07-01 (×3): qty 1

## 2020-07-01 MED ORDER — SODIUM CHLORIDE 0.9 % IV SOLN: INTRAVENOUS | Status: DC | PRN

## 2020-07-01 MED ORDER — METOPROLOL TARTRATE 50 MG PO TABS
50.0000 mg | ORAL_TABLET | Freq: Two times a day (BID) | ORAL | Status: DC
  Administered 2020-07-01 – 2020-07-02 (×2): 50 mg via ORAL
  Filled 2020-07-01 (×2): qty 1

## 2020-07-01 MED ORDER — AMIODARONE HCL 200 MG PO TABS
400.0000 mg | ORAL_TABLET | Freq: Every day | ORAL | Status: DC
  Administered 2020-07-01: 200 mg via ORAL
  Administered 2020-07-02 – 2020-07-03 (×2): 400 mg via ORAL
  Filled 2020-07-01 (×3): qty 2

## 2020-07-01 MED ORDER — POTASSIUM CHLORIDE CRYS ER 20 MEQ PO TBCR
40.0000 meq | EXTENDED_RELEASE_TABLET | Freq: Two times a day (BID) | ORAL | Status: DC
  Administered 2020-07-01 – 2020-07-02 (×4): 40 meq via ORAL
  Filled 2020-07-01 (×5): qty 2

## 2020-07-01 MED ORDER — PROPOFOL 500 MG/50ML IV EMUL
INTRAVENOUS | Status: DC | PRN
  Administered 2020-07-01: 125 ug/kg/min via INTRAVENOUS

## 2020-07-01 MED ORDER — METOPROLOL TARTRATE 5 MG/5ML IV SOLN
INTRAVENOUS | Status: AC
  Filled 2020-07-01: qty 5

## 2020-07-01 MED ORDER — PROPOFOL 10 MG/ML IV BOLUS
INTRAVENOUS | Status: DC | PRN
  Administered 2020-07-01: 50 mg via INTRAVENOUS
  Administered 2020-07-01: 30 mg via INTRAVENOUS

## 2020-07-01 NOTE — Progress Notes (Signed)
OT Cancellation Note  Patient Details Name: Joseph Lang MRN: [id number] DOB: 07/23/1976   Cancelled Treatment:    Reason Eval/Treat Not Completed: Medical issues which prohibited therapy (Tachy with HR in 140s at rest). OT will continue efforts toward completion of evaluation when patient is able to participate.   Kallie Edward OTR/L Supplemental OT, Department of rehab services [phone]  Ringo Sherod R H. 07/01/2020, 8:49 AM

## 2020-07-01 NOTE — Anesthesia Procedure Notes (Signed)
Procedure Name: MAC Date/Time: 07/01/2020 12:14 PM Performed by: Kathryne Hitch, CRNA Pre-anesthesia Checklist: Patient identified, Emergency Drugs available, Suction available and Patient being monitored Patient Re-evaluated:Patient Re-evaluated prior to induction Oxygen Delivery Method: Nasal cannula Preoxygenation: Pre-oxygenation with 100% oxygen Induction Type: IV induction Placement Confirmation: positive ETCO2

## 2020-07-01 NOTE — CV Procedure (Signed)
Procedure performed: TEE guided direct-current cardioversion with MAC anesthesia.  Indication: Patient status post CABG, is been in persistent atypical atrial flutter with RVR.  He has undergone left atrial appendage ligation/clipping during CABG.  TEE: Under moderate sedation, TEE was performed without complications: LV: Normal size,  EF Low normal. RV: Normal LA: Normal. Left atrial appendage clip is seen. No thrombus in the LA or on the device. IAS is intact by color Doppler.  RA: Normal MV: Normal Trace MR. TV: Normal Trace TR AV: Normal. No AI or AS. PV: Normal. Trace PI.  Thoracic and ascending aorta: Normal without significant plaque or atheromatous changes.  Direct current cardioversion:  Indication symptomatic Atypical atrial flutter. Procedure: Using deep sedation with IV Propofol,  synchronized direct current cardioversion performed. Patient was delivered with 120 x 1 then 200 Joules of electricity X 1 with success to NSR. Patient tolerated the procedure well. No immediate complication noted.     Adrian Prows, MD, [facility] 07/01/2020, 12:31 PM Office: [phone]

## 2020-07-01 NOTE — Progress Notes (Signed)
When speaking w/ pt during assessment, requested to be left asleep and skip midnight vitals and tylenol. Rounded on at 2300- pt asleep comfortably w/ home cpap in place. NSR 90s. Will continue to monitor.

## 2020-07-01 NOTE — Progress Notes (Signed)
  Echocardiogram Echocardiogram Transesophageal has been performed.  Burnard Hawthorne 07/01/2020, 12:32 PM

## 2020-07-01 NOTE — Discharge Summary (Signed)
Physician Discharge Summary  Patient ID: Joseph Lang MRN: [id number] DOB/AGE: August 01, 1976 44 y.o.  Admit date: 06/27/2020 Discharge date: 07/03/2020  Admission Diagnoses:  Patient Active Problem List   Diagnosis Date Noted  . S/P CABG x 2 06/28/2020: LIMA to LAD, Free Radial graft to D1. LAA ligation with 40 mm Atricure. 06/27/2020  . Coronary artery disease 04/23/2020  . Hypercholesteremia 04/23/2020  . Primary hypertension 04/23/2020  . Abnormal nuclear stress test 03/01/2020  . Class 3 severe obesity due to excess calories with serious comorbidity and body mass index (BMI) of 50.0 to 59.9 in adult (HCC) 03/01/2020  . OSA on CPAP 03/01/2020  . Paroxysmal atrial fibrillation (HCC) 01/25/2020  . Secondary hypercoagulable state (HCC) 01/25/2020  . Pulmonary emboli (HCC) 04/08/2015  . Right leg pain 04/08/2015  . S/P laparoscopic sleeve gastrectomy May 2016 02/14/2015  . BMI 50.0-59.9, adult (HCC) 07/18/2014   Discharge Diagnoses:   Patient Active Problem List   Diagnosis Date Noted  . S/P CABG x 2 06/28/2020: LIMA to LAD, Free Radial graft to D1. LAA ligation with 40 mm Atricure. 06/27/2020  . Coronary artery disease 04/23/2020  . Hypercholesteremia 04/23/2020  . Primary hypertension 04/23/2020  . Abnormal nuclear stress test 03/01/2020  . Class 3 severe obesity due to excess calories with serious comorbidity and body mass index (BMI) of 50.0 to 59.9 in adult (HCC) 03/01/2020  . OSA on CPAP 03/01/2020  . Paroxysmal atrial fibrillation (HCC) 01/25/2020  . Secondary hypercoagulable state (HCC) 01/25/2020  . Pulmonary emboli (HCC) 04/08/2015  . Right leg pain 04/08/2015  . S/P laparoscopic sleeve gastrectomy May 2016 02/14/2015  . BMI 50.0-59.9, adult (HCC) 07/18/2014   Discharged Condition: good  History of Present Illness:  Joseph Lang is a 44 yo morbidly obese male with history of OSA, sleeve gastrectomy, H/O PE on anticoagulation, Paroxysmal Atrial Fibrillation S/P  Cardioversion 01/2020, HTN, and nicotine abuse with use since age 42.  He presented with complaints of palpitations in April of this year.  Workup at the time included a stress test which was abnormal.  Due to this is was felt the patient should undergo cardiac catheterization.  This was performed on 04/19/2020 and showed multivessel CAD.  It was felt he would best be treated with coronary bypass grafting.  The patient wished to discuss this on an outpatient basis.  He was referred to [facility] and evaluated by Joseph Lang at which time he was in agreement the patient would benefit from coronary bypass grafting.  The patient wished for some time to stop smoking and attempted weight loss prior to proceeding. He was also encouraged to get a COVID vaccine prior to surgery.  He was agreeable to this and would follow up with Joseph Lang in 4 weeks.  The patient had successfully quit smoking since July.  He had also lost about 25 lbs.  The risks and benefits of the procedure were explained to the patient and he was agreeable to proceed.    Hospital Course:   Joseph Lang presented to [facility] on 06/27/2020.  He was taken to the operating room and underwent CABG x 2 utilizing LIMA to LAD and Left radial artery to Diagonal.  He also underwent open harvest of his left radial artery.  He also underwent clipping of his LA appendage with a 40 mm Atricure V Clip.  He tolerated the procedure without difficulty and was taken to the SICU in stable condition.  The patient was weaned and extubated the  evening of surgery.  During his stay in the SICU the patients Dopamine, NTG, and Neo-synephrine were discontinued on POD #1.  The patient was started on Imdur for his radial artery graft.  His chest tubes and arterial lines were removed without difficulty.  His left radial artery site has a pravena wound vac in place.  He denies weakness and parasthesias of his hand or thumb.  He was placed on Lovenox for his long standing history  of PE/DVT.  He will be transitioned back to his home regimen of Xarelto prior to discharge.  He was in NSR with PVCs, sometimes bigeminy.  His EPW were removed without difficulty.  He was medically stable for transfer to 4E on 06/29/2020.  The patient developed PAF overnight.  He was treated with IV Lopressor which would provide temporary relief.  He was given an IV bolus of Amiodarone.His cardiologist was consulted and recommended starting the patient on Digoxin therapy, his home Cardizem was started at a reduced dose.  It was also felt the patient should undergo cardioversion.  This was performed on 07/01/2020.  The patient converted to sinus rhythm. His Lopressor was increased to 75 mg bid for better HR and BP control on 09/18.  The patient has remained clinically stable post cardioversion.  He remains in sinus rhythm on the following: Amiodarone 400 mg bid, Digoxin 0.25 mg IV, Lopressor 75 mg bid (just increased last evening), and Imdur 15 mg daily.  Digoxin was decreased and changed to oral prior to discharge. Amiodarone will be titrated down accordingly as well. Joseph Lang will be restarted at discharge. His Pravena wound vac from his sternum was removed 09/18 and the sternal wound was mostly clean and dry. His LUE wound had mild bloody ooze as staple had dislodged.Patient instructed to use gauze and tape for LUE wound and this is be changed daily and PRN.   His surgical wounds are healing without evidence of infection.  As discussed with Joseph Lang, will keep on Lasix and potassium until seen in the office. As discussed with Joseph Lang, patient is medically stable for discharge home today.  Consults: cardiology  Significant Diagnostic Studies: angiography:   LV: Moderately elevated LVEDP.  LM: Mid 40%. Hazy. Severe diffuse multivessel disease. Ectasia of the LAD, RCA. Prox LAD aneurysmal. Occluded after origin of a very large D1. D1 is very large with mid 90% stenosis. Ipsilateral and contralateral (RCA)  to mid LAD noted.  Cx: Prox 70-80% stenosis involves the bifurcation of large OM-1. At the bifurcation there is at least a 80% stenosis. RCA: Diffusely ectatic. PL branch has a focal 50% stenosis.  Slow flow noted in the RCA.   Rec: Complex multivessel CAD and will benefit from CABG to LAD and D1 and OM1 with arterial conduits. Maze and LAA ligation. Patient went into atrial fibrillation in the cath lab. 70 mL contrast used. IV dilt and metoprolol given but did not convert to sinus. He will resume Xarelto tonight.  Patient wants to come in and discuss personally at the office. Not happy about CABG potential.  Treatments: surgery:   1.  Coronary artery bypass grafting x2 with the left internal mammary artery to the left anterior descending coronary artery, left radial artery to the diagonal coronary artery with left radial artery harvest 2.Placement of AtriCure clip on  Left atrium, 40 mm.  Discharge Exam: Blood pressure 127/71, pulse 80, temperature 98.1 F (36.7 C), temperature source Oral, resp. rate 17, height 6\' 2"  (1.88 m), weight )  173.4 kg, SpO2 98 %. Cardiovascular: RRR Pulmonary: Slightly diminished bibasilar breath sounds Abdomen: Soft, obese, non tender, bowel sounds present. Extremities: Mild bilateral lower extremity edema.Motor/sensory intact Wounds: Sternal wound is clean and dry.  No erythema or signs of infection. LUE wound is clean and dry (minor bleeding where one staple dislodged)   Discharge Instructions    Amb Referral to Cardiac Rehabilitation   Complete by: As directed    Diagnosis: CABG   CABG X ___: 2   After initial evaluation and assessments completed: Virtual Based Care may be provided alone or in conjunction with Phase 2 Cardiac Rehab based on patient barriers.: Yes      Discharge Medications:  The patient has been discharged on:   1.Beta Blocker:  Yes [ X  ]                              No   [   ]                              If No,  reason:  2.Ace Inhibitor/ARB: Yes [   ]                                     No  [    ]                                     If No, reason:Labile BP  3.Statin:   Yes [ X  ]                  No  [   ]                  If No, reason:  4.Ecasa:  Yes  [ X  ]                  No   [   ]                  If No, reason:     Allergies as of 07/03/2020      Reactions   Amoxicillin Nausea And Vomiting      Medication List    STOP taking these medications   benazepril 20 MG tablet Commonly known as: LOTENSIN   diltiazem 240 MG 24 hr capsule Commonly known as: CARDIZEM CD   metoprolol succinate 50 MG 24 hr tablet Commonly known as: TOPROL-XL   nitroGLYCERIN 0.4 MG SL tablet Commonly known as: Nitrostat     TAKE these medications   amiodarone 200 MG tablet Commonly known as: PACERONE Take 1 tablet (200 mg total) by mouth daily. Take 200 mg bid for one week;then take 200 mg daily thereafter What changed: additional instructions Notes to patient: Take as written   aspirin EC 81 MG tablet Take 1 tablet (81 mg total) by mouth daily.   atorvastatin 80 MG tablet Commonly known as: LIPITOR Take 1 tablet (80 mg total) by mouth daily.   digoxin 0.125 MG tablet Commonly known as: Lanoxin Take 1 tablet (0.125 mg total) by mouth daily.   furosemide 40 MG tablet Commonly known as: LASIX Take 1 tablet (40 mg total) by  mouth daily.   isosorbide mononitrate 30 MG 24 hr tablet Commonly known as: IMDUR Take 0.5 tablets (15 mg total) by mouth daily. Please take until finished   Metoprolol Tartrate 75 MG Tabs Take 75 mg by mouth 2 (two) times daily.   oxyCODONE 5 MG immediate release tablet Commonly known as: Oxy IR/ROXICODONE Take 1 tablet (5 mg total) by mouth every 4 (four) hours as needed for severe pain.   potassium chloride SA 20 MEQ tablet Commonly known as: KLOR-CON Take 1 tablet (20 mEq total) by mouth daily.   RABEprazole 20 MG tablet Commonly known as: ACIPHEX Take  20 mg by mouth daily.   Xarelto 20 MG Tabs tablet Generic drug: rivaroxaban Take 20 mg by mouth daily.       Follow-up Information    Delight Ovens, MD Follow up on 07/28/2020.   Specialty: Cardiothoracic Surgery Why: Appointment is at 12:00, please get CXR at 11:30 Contact information: [address] [place] [address] [phone]        [facility] Follow up on 07/13/2020.   Specialty: Cardiothoracic Surgery Why: Appointment is at 11:00, for staple removal Contact information: [address] [place] [employer], [address] [place] [address] [phone]       Yates Decamp, MD Follow up on 07/14/2020.   Specialty: Cardiology Why: Appointment is at 11:15 Contact information: [address] [place] [address] [phone]               Signed: Elenore Rota 07/03/2020, 11:57 AM

## 2020-07-01 NOTE — Progress Notes (Signed)
Mobility Specialist - Progress Note   07/01/20 1331  Mobility  Activity Ambulated in hall  Level of Assistance Modified independent, requires aide device or extra time  Assistive Device Front wheel walker  Distance Ambulated (ft) 240 ft  Mobility Response Tolerated well  Mobility performed by Mobility specialist  $Mobility charge 1 Mobility    Pre-mobility: 96 HR During mobility: 106 HR Post-mobility: 92 HR  Pt able to ambulate easily at a brisk pace, he wanted to end ambulation early as he was frustrated with his underwear falling down. He stated he'd like to work on increasing his stamina. Pt asx throughout.   Mamie Levers Mobility Specialist Mobility Specialist Phone: [phone]

## 2020-07-01 NOTE — Progress Notes (Signed)
PT Cancellation Note  Patient Details Name: Joseph Lang MRN: [id number] DOB: Oct 27, 1975   Cancelled Treatment:    Reason Eval/Treat Not Completed: Medical issues which prohibited therapy. Tachy with HR in 140s at rest. Will check back once medically ready.    Sheral Apley 07/01/2020, 9:50 AM

## 2020-07-01 NOTE — Progress Notes (Signed)
Pt back from endo. VSS. Call bell in reach. Will continue to monitor.  Bodin Gorka R Milton Streicher, RN  

## 2020-07-01 NOTE — Interval H&P Note (Signed)
History and Physical Interval Note:  07/01/2020 12:12 PM  Joseph Lang  has presented today for surgery, with the diagnosis of afib.  The various methods of treatment have been discussed with the patient and family. After consideration of risks, benefits and other options for treatment, the patient has consented to  Procedure(s): CARDIOVERSION (N/A) TRANSESOPHAGEAL ECHOCARDIOGRAM (TEE) (N/A) as a surgical intervention.  The patient's history has been reviewed, patient examined, no change in status, stable for surgery.  I have reviewed the patient's chart and labs.  Questions were answered to the patient's satisfaction.     Yates Decamp

## 2020-07-01 NOTE — Transfer of Care (Signed)
Immediate Anesthesia Transfer of Care Note  Patient: Joseph Lang  Procedure(s) Performed: CARDIOVERSION (N/A ) TRANSESOPHAGEAL ECHOCARDIOGRAM (TEE) (N/A )  Patient Location: Endoscopy Unit  Anesthesia Type:General  Level of Consciousness: awake, alert , oriented and patient cooperative  Airway & Oxygen Therapy: Patient Spontanous Breathing and Patient connected to nasal cannula oxygen  Post-op Assessment: Report given to RN and Post -op Vital signs reviewed and stable  Post vital signs: Reviewed and stable  Last Vitals:  Vitals Value Taken Time  BP    Temp    Pulse    Resp    SpO2 100     Last Pain:  Vitals:   07/01/20 1158  TempSrc: Oral  PainSc: 0-No pain      Patients Stated Pain Goal: 3 (06/28/20 0535)  Complications: No complications documented.

## 2020-07-01 NOTE — Anesthesia Preprocedure Evaluation (Addendum)
Anesthesia Evaluation  Patient identified by MRN, date of birth, ID band Patient awake    Reviewed: Allergy & Precautions, NPO status , Patient's Chart, lab work & pertinent test results, reviewed documented beta blocker date and time   History of Anesthesia Complications (+) PONV and history of anesthetic complications  Airway Mallampati: II  TM Distance: >3 FB Neck ROM: Full    Dental  (+) Dental Advisory Given, Teeth Intact   Pulmonary sleep apnea and Continuous Positive Airway Pressure Ventilation , former smoker, PE 14 pack year history, quit 04/20/20   Pulmonary exam normal breath sounds clear to auscultation       Cardiovascular hypertension, Pt. on home beta blockers and Pt. on medications + CAD, + CABG (s/p CABG x 2 06/28/20) and + DVT  Normal cardiovascular exam+ dysrhythmias Atrial Fibrillation  Rhythm:Regular Rate:Normal     Neuro/Psych negative neurological ROS  negative psych ROS   GI/Hepatic Neg liver ROS, GERD  Medicated and Controlled,Hx gastric bypass   Endo/Other  Morbid obesityBMI 49   Renal/GU negative Renal ROS  negative genitourinary   Musculoskeletal negative musculoskeletal ROS (+)   Abdominal (+) + obese,   Peds  Hematology  (+) Blood dyscrasia, anemia , hct 40   Anesthesia Other Findings   Reproductive/Obstetrics negative OB ROS                            Anesthesia Physical Anesthesia Plan  ASA: IV  Anesthesia Plan: General   Post-op Pain Management:    Induction: Intravenous  PONV Risk Score and Plan: Propofol infusion, TIVA and Treatment may vary due to age or medical condition  Airway Management Planned: Mask and Natural Airway  Additional Equipment: None  Intra-op Plan:   Post-operative Plan: Extubation in OR  Informed Consent: I have reviewed the patients History and Physical, chart, labs and discussed the procedure including the risks,  benefits and alternatives for the proposed anesthesia with the patient or authorized representative who has indicated his/her understanding and acceptance.     Dental advisory given  Plan Discussed with: CRNA  Anesthesia Plan Comments: (Access: RUE PICC )       Anesthesia Quick Evaluation

## 2020-07-01 NOTE — Progress Notes (Signed)
CARDIAC REHAB PHASE I   PRE:  Rate/Rhythm: 99 SR  BP:  Supine:   Sitting: 156/99     SaO2: 96  MODE:  Ambulation: 1000+ ft   POST:  Rate/Rhythem: 108 ST with PVC's  BP:  Supine:   Sitting: 167/100    SaO2: 97  Pt was seated in chair upon arrival. Pt was independent with getting out of chair to walk. Did not need any assistance with ambulation. Pt ambulated 1000+ ft with no CP or complaints. Encouraged continued ambulation and IS use.  [phone]   Norris Cross, MS, CEP  07/01/2020 3:10 PM

## 2020-07-01 NOTE — Progress Notes (Addendum)
      [address]       [place] [address]             [phone]      4 Days Post-Op Procedure(s) (LRB): CORONARY ARTERY BYPASS GRAFTING (CABG) X TWO, USING LEFT INTERNAL MAMMARY ARTERY AND LEFT ARM RADIAL ARTERY (N/A) LEFT RADIAL ARTERY HARVEST (Left) CLIPPING OF ATRIAL APPENDAGE USING ATRICURE CLIP SIZE 40 (N/A) TRANSESOPHAGEAL ECHOCARDIOGRAM (TEE) (N/A)   Subjective:  Patient doing so/so.  He really wants to go home.  He states he cant get up and move around due to elevated HR.  Objective: Vital signs in last 24 hours: Temp:  [97.5 F (36.4 C)-99.6 F (37.6 C)] 97.8 F (36.6 C) (09/17 0313) Pulse Rate:  [140-148] 148 (09/17 0313) Cardiac Rhythm: Sinus tachycardia;Other (Comment) (09/17 0700) Resp:  [19-27] 22 (09/17 0313) BP: (88-174)/(45-108) 113/81 (09/17 0313) SpO2:  [91 %-96 %] 95 % (09/17 0313) Weight:  [172 kg] 172 kg (09/17 0500)  Intake/Output from previous day: 09/16 0701 - 09/17 0700 In: 400 [P.O.:300; I.V.:100] Out: -   General appearance: alert, cooperative and no distress Heart: regular rate and rhythm Lungs: diminished breath sounds bilaterally Abdomen: soft, non-tender; bowel sounds normal; no masses,  no organomegaly Extremities: edema trace Wound: clean and dry radial site, staples in tact, ecchymosis present as expected, pravena on sternotomy  Lab Results: Recent Labs    06/29/20 0400 06/30/20 0500  WBC 17.2* 13.7*  HGB 12.9* 12.9*  HCT 39.9 40.0  PLT 222 239   BMET:  Recent Labs    06/30/20 0500 07/01/20 0300  NA 139 140  K 3.4* 3.2*  CL 105 106  CO2 27 26  GLUCOSE 135* 112*  BUN 5* 6  CREATININE 0.78 0.75  CALCIUM 9.5 9.2    PT/INR:  Recent Labs    07/01/20 0300  LABPROT 22.2*  INR 2.0*   ABG    Component Value Date/Time   PHART 7.362 06/27/2020 2100   HCO3 23.2 06/27/2020 2100   TCO2 24 06/27/2020 2100   ACIDBASEDEF 2.0 06/27/2020 2100   O2SAT 95.0 06/27/2020 2100   CBG (last 3)  Recent  Labs    06/29/20 0418 06/29/20 0817 06/29/20 1137  GLUCAP 108* 97 155*    Assessment/Plan: S/P Procedure(s) (LRB): CORONARY ARTERY BYPASS GRAFTING (CABG) X TWO, USING LEFT INTERNAL MAMMARY ARTERY AND LEFT ARM RADIAL ARTERY (N/A) LEFT RADIAL ARTERY HARVEST (Left) CLIPPING OF ATRIAL APPENDAGE USING ATRICURE CLIP SIZE 40 (N/A) TRANSESOPHAGEAL ECHOCARDIOGRAM (TEE) (N/A)  1. CV- A.fib, episodes of A. Flutter- HR at 140s this morning- on Amiodarone, Cardizem, Digoxin loading, Lopressor... home Xarelto resumed due to long standing history of DVT/PT 2. Pulm- off oxygen, no acute issues 3. Renal- creatinine WNL, K is low at 3.2, will supplement 40 meq BID 4. Dispo- patient continues to have A.Fib/flutter- appreciated Dr. Jacinto Halim recommendations, planning to cardiovert the patient today, supplement potassium, if patient can maintain NSR post cardioversion and ambulates without difficulty he will likely be ready for d/c over the weekend   LOS: 4 days    Lowella Dandy, PA-C 07/01/2020  remains in flutter today  For cardioversion this am - Dr Jacinto Halim Replacing kcl On xarelto and 81 mg asa

## 2020-07-01 NOTE — Progress Notes (Signed)
Patient had TEE with cardioversion done today and was successfully converted to a sinus rhythm from atrial flutter. We will monitor through tomorrow to ensure that he stays in sinus rhythm possible discharge home on Sunday morning  Giuliana Handyside B Yanelly Cantrelle MD      [address] [employer] Office [phone]

## 2020-07-01 NOTE — Anesthesia Postprocedure Evaluation (Signed)
Anesthesia Post Note  Patient: Joseph Lang  Procedure(s) Performed: CARDIOVERSION (N/A ) TRANSESOPHAGEAL ECHOCARDIOGRAM (TEE) (N/A )     Patient location during evaluation: PACU Anesthesia Type: General Level of consciousness: awake and alert, oriented and patient cooperative Pain management: pain level controlled Vital Signs Assessment: post-procedure vital signs reviewed and stable Respiratory status: spontaneous breathing, nonlabored ventilation and respiratory function stable Cardiovascular status: blood pressure returned to baseline and stable Postop Assessment: no apparent nausea or vomiting Anesthetic complications: no   No complications documented.  Last Vitals:  Vitals:   07/01/20 1255 07/01/20 1319  BP: 112/73 119/87  Pulse: 86 88  Resp: (!) 22 20  Temp:  36.9 C  SpO2: 97% 96%    Last Pain:  Vitals:   07/01/20 1319  TempSrc: Oral  PainSc:                  [name]

## 2020-07-01 NOTE — Progress Notes (Signed)
CARDIAC REHAB PHASE I   Offered to walk with pt. Pt requesting to walk a little later as he has some business to take care of. Will f/u as time allows.  [phone] Reynold Bowen, RN BSN 07/01/2020 2:07 PM

## 2020-07-02 LAB — BASIC METABOLIC PANEL
Anion gap: 9 (ref 5–15)
BUN: 9 mg/dL (ref 6–20)
CO2: 26 mmol/L (ref 22–32)
Calcium: 9.8 mg/dL (ref 8.9–10.3)
Chloride: 105 mmol/L (ref 98–111)
Creatinine, Ser: 0.81 mg/dL (ref 0.61–1.24)
GFR calc Af Amer: >60 mL/min (ref >60)
GFR calc non Af Amer: >60 mL/min (ref >60)
Glucose, Bld: 108 mg/dL — ABNORMAL HIGH (ref 70–99)
Potassium: 3.7 mmol/L (ref 3.5–5.1)
Sodium: 140 mmol/L (ref 135–145)

## 2020-07-02 MED ORDER — METOPROLOL TARTRATE 50 MG PO TABS
75.0000 mg | ORAL_TABLET | Freq: Two times a day (BID) | ORAL | Status: DC
  Administered 2020-07-02 – 2020-07-03 (×2): 75 mg via ORAL
  Filled 2020-07-02 (×2): qty 1

## 2020-07-02 NOTE — Progress Notes (Addendum)
      [address]       [name] [address]             [phone]       5 days post CABG x 2, LA clip 1 Day Post-Op Procedure(s) (LRB): CARDIOVERSION (N/A) TRANSESOPHAGEAL ECHOCARDIOGRAM (TEE) (N/A)  Subjective: Patient sitting in chair, brushing his teeth. He asked that sternal wound VAC be removed this am. He is not sleeping much so looking forward to going home in am.  Objective: Vital signs in last 24 hours: Temp:  [98 F (36.7 C)-98.8 F (37.1 C)] 98.2 F (36.8 C) (09/18 0630) Pulse Rate:  [84-149] 91 (09/18 0630) Cardiac Rhythm: Normal sinus rhythm (09/17 1900) Resp:  [15-26] 16 (09/18 0630) BP: (106-148)/(65-94) 133/76 (09/18 0630) SpO2:  [92 %-100 %] 92 % (09/18 0630) Weight:  [172.4 kg] 172.4 kg (09/18 0630)  Pre op weight 174.6 kg Current Weight  07/02/20 (!) 172.4 kg      Intake/Output from previous day: 09/17 0701 - 09/18 0700 In: 100 [I.V.:100] Out: -    Physical Exam:  Cardiovascular: RRR Pulmonary: Slightly diminished bibasilar breath sounds Abdomen: Soft, obese, non tender, bowel sounds present. Extremities: Mild bilateral lower extremity edema.Motor/sensory intact Wounds: Prevena wound vac removed and sternal wounds is clean and dry.  No erythema or signs of infection. LUE wound is clean and dry (dried sero sanguinous drainage on bandage so changed this am)  Lab Results: CBC: Recent Labs    06/30/20 0500  WBC 13.7*  HGB 12.9*  HCT 40.0  PLT 239   BMET:  Recent Labs    07/01/20 0300 07/02/20 0630  NA 140 140  K 3.2* 3.7  CL 106 105  CO2 26 26  GLUCOSE 112* 108*  BUN 6 9  CREATININE 0.75 0.81  CALCIUM 9.2 9.8    PT/INR:  Lab Results  Component Value Date   INR 2.0 (H) 07/01/2020   INR 1.2 06/27/2020   INR 1.3 (H) 06/27/2020   ABG:  INR: Will add last result for INR, ABG once components are confirmed Will add last 4 CBG results once components are confirmed  Assessment/Plan:  1. CV - Previous a  fib with RVR/flutter. S/p TEE, cardioversion for a fib/flutter. Maintaining SR. On Amiodarone 400 mg bid, Digoxin 0.25 mg IV, Lopressor 50 mg bid, and Imdur 15 mg daily. Will restart Xarelto at discharge 2.  Pulmonary - On room air. Encourage incentive spirometer. 3. Volume Overload - On Lasix 40 mg daily 4. Supplement potassium 5. Discharge in am  Lelon Huh [facility] 07/02/2020,7:40 AM   I have seen and examined the patient and agree with the assessment and plan as outlined.  Purcell Nails, MD 07/02/2020 12:28 PM

## 2020-07-02 NOTE — Progress Notes (Signed)
Subjective:  Doing well. Had brief palpitations this morning and had few seconds of non sustained AT.   Intake/Output from previous day:  I/O last 3 completed shifts: In: 400 [P.O.:300; I.V.:100] Out: -  No intake/output data recorded.  Blood pressure (!) 156/90, pulse 86, temperature 98.1 F (36.7 C), temperature source Oral, resp. rate 16, height 6' 2"  (1.88 m), weight (!) 172.4 kg, SpO2 95 %. Physical Exam Constitutional:      Comments: Morbidly obese in no acute distress.  Cardiovascular:     Rate and Rhythm: Normal rate and regular rhythm.     Pulses:          Carotid pulses are 2+ on the right side and 2+ on the left side.      Dorsalis pedis pulses are 2+ on the right side and 2+ on the left side.       Posterior tibial pulses are 2+ on the right side and 2+ on the left side.     Heart sounds: Normal heart sounds. No murmur heard.  No gallop.      Comments: Femoral and popliteal pulse difficult to feel due to patient's body habitus.  No leg edema. JVD difficult to see due to short neck. Pulmonary:     Effort: Pulmonary effort is normal.     Breath sounds: Normal breath sounds.  Abdominal:     General: Bowel sounds are normal.     Palpations: Abdomen is soft.     Comments: Obese. Pannus present    Lab Results:  No results for input(s): PROBNP in the last 8760 hours. BMP Latest Ref Rng & Units 07/02/2020 07/01/2020 06/30/2020  Glucose 70 - 99 mg/dL 108(H) 112(H) 135(H)  BUN 6 - 20 mg/dL 9 6 5(L)  Creatinine 0.61 - 1.24 mg/dL 0.81 0.75 0.78  BUN/Creat Ratio 9 - 20 - - -  Sodium 135 - 145 mmol/L 140 140 139  Potassium 3.5 - 5.1 mmol/L 3.7 3.2(L) 3.4(L)  Chloride 98 - 111 mmol/L 105 106 105  CO2 22 - 32 mmol/L 26 26 27   Calcium 8.9 - 10.3 mg/dL 9.8 9.2 9.5   Hepatic Function Latest Ref Rng & Units 06/24/2020 01/17/2020 02/02/2015  Total Protein 6.5 - 8.1 g/dL 6.7 6.6 7.8  Albumin 3.5 - 5.0 g/dL 3.9 3.6 4.5  AST 15 - 41 U/L 23 22 27   ALT 0 - 44 U/L 33 23 36  Alk  Phosphatase 38 - 126 U/L 74 71 73  Total Bilirubin 0.3 - 1.2 mg/dL 1.1 0.9 1.1   CBC Latest Ref Rng & Units 06/30/2020 06/29/2020 06/28/2020  WBC 4.0 - 10.5 K/uL 13.7(H) 17.2(H) 14.6(H)  Hemoglobin 13.0 - 17.0 g/dL 12.9(L) 12.9(L) 11.6(L)  Hematocrit 39 - 52 % 40.0 39.9 36.2(L)  Platelets 150 - 400 K/uL 239 222 183   Lipid Panel  No results found for: CHOL, TRIG, HDL, CHOLHDL, VLDL, LDLCALC, LDLDIRECT Cardiac Panel (last 3 results) No results for input(s): CKTOTAL, CKMB, TROPONINI, RELINDX in the last 72 hours.  HEMOGLOBIN A1C Lab Results  Component Value Date   HGBA1C 5.5 06/23/2020   MPG 111.15 06/23/2020   TSH Recent Labs    01/17/20 0813  TSH 1.564   Imaging:   Cardiac Studies:  EKG 07/01/2020:  normal sinus rhythm, occasional PVC noted, unifocal, Low voltage complexes, Poor R progression, COPD patter.  TEE Guided direct current cardioversion 07/01/2020:  1. Left ventricular ejection fraction, by estimation, is 45 to 50%. The  left ventricle has mildly decreased  function. The left ventricle  demonstrates global hypokinesis. Left ventricular diastolic function could  not be evaluated.  2. Right ventricular systolic function is normal. The right ventricular  size is normal.  3. LAA clipp with reverberation artifact noted. Very small residual LAA  noted without thrombus with normal function. . No left atrial/left atrial  appendage thrombus was detected.  4. The mitral valve is normal in structure. Trivial mitral valve  regurgitation.  5. The aortic valve is normal in structure. Aortic valve regurgitation is not visualized. No aortic stenosis is present.  6. 120Jx1 then 200J x 1 A. FL to NSR.   Scheduled Meds: . acetaminophen  1,000 mg Oral Q6H   Or  . acetaminophen (TYLENOL) oral liquid 160 mg/5 mL  1,000 mg Per Tube Q6H  . amiodarone  400 mg Oral Daily  . aspirin EC  81 mg Oral Daily   Or  . aspirin  81 mg Per Tube Daily  . atorvastatin  80 mg Oral Daily  .  bisacodyl  10 mg Oral Daily   Or  . bisacodyl  10 mg Rectal Daily  . Chlorhexidine Gluconate Cloth  6 each Topical Daily  . digoxin  0.25 mg Intravenous Daily  . docusate sodium  200 mg Oral Daily  . furosemide  40 mg Oral Daily  . isosorbide mononitrate  15 mg Oral Daily  . mouth rinse  15 mL Mouth Rinse BID  . metoprolol tartrate  75 mg Oral BID  . mupirocin ointment   Nasal BID  . pantoprazole  40 mg Oral Daily  . potassium chloride  40 mEq Oral BID  . rivaroxaban  20 mg Oral Q supper  . sodium chloride flush  10-40 mL Intracatheter Q12H  . sodium chloride flush  10-40 mL Intracatheter Q12H  . sodium chloride flush  3 mL Intravenous Q12H   Continuous Infusions: . sodium chloride    . lactated ringers 20 mL/hr at 06/27/20 1600   PRN Meds:.sodium chloride, diphenhydrAMINE, lactulose, metoprolol tartrate, ondansetron (ZOFRAN) IV, oxyCODONE, simethicone, sodium chloride flush, sodium chloride flush, sodium chloride flush, traMADol  Assessment/Plan:  1.  Atypical atrial flutter with 2: 1 conduction, sustained S/P DCCV 07/01/2020:  CHA2DS2-VASc Score is 2.  Yearly risk of stroke: 2.3% (CAD, HTN).  Score of 1=0.6; 2=2.2; 3=3.2; 4=4.8; 5=7.2; 6=9.8; 7=>9.8) -(CHF; HTN; vasc disease DM,  Male = 1; Age <65 =0; 65-74 = 1,  >75 =2; stroke/embolism= 2).   2.  Coronary artery disease native vessel with stable angina pectoris, multivessel disease 3.  CABG x2 with LIMA to LAD and radial graft to D1 and left atrial appendage ligation.  06/27/2020. 4.  Morbid obesity  Rec: Patient is maintaining sinus rhythm.  I have increased the dose of metoprolol tartrate from 58m to 37.5 mg p.o. twice daily.  Continue amiodarone at 200 mg daily on discharge.  Continue high intensity high-dose atorvastatin.  Patient appears to be motivated in weight loss and also cardiac rehab.  I will see him back in the office in 2 to 3 weeks.  JAdrian Prows M.D. 07/02/2020, 2:34 PM [facility] Pager:  [phone] Office: [phone] no answer: [phone]

## 2020-07-02 NOTE — Progress Notes (Signed)
PT Cancellation Note  Patient Details Name: Joseph Lang MRN: [id number] DOB: 03-15-1976   Cancelled Treatment:    Reason Eval/Treat Not Completed: Patient declined, no reason specified. Patient had just walked with cardiac rehab. Has been doing well with ambulation per their notes. If time allows, may check back later or tomorrow.    Paras Kreider 07/02/2020, 10:47 AM

## 2020-07-02 NOTE — Progress Notes (Signed)
CARDIAC REHAB PHASE I   PRE:  Rate/Rhythm: 78 SR   BP:  Sitting: 133/77      SaO2: 96% RA  MODE:  Ambulation: 1000 ft   POST:  Rate/Rhythm: 102 ST end of walk, 5 post 85 SR  BP:  Sitting: 153/92      SaO2: 97%  Pt in chair. Pt eager to walk. Helped pt change to shirt and pants per pt request. Pt ambulated 1000 ft independently with steady and brisk gait. Pt denied CP, SOB or dizziness. Pt returned to recliner. Reviewed sternal precautions, restrictions, heart healthy diet, IS use, exercise guidelines, continued smoking cessation, and phase II cardiac rehab. Pt very interested in cardiac rehab and dietician services. Will send referral to [facility] per pt's request.   York Cerise MS, ACSM CEP  10:37 AM 07/02/2020

## 2020-07-02 NOTE — Evaluation (Signed)
Occupational Therapy Evaluation and Discharge Patient Details Name: Joseph Lang MRN: [id number] DOB: [birth year] Today's Date: 07/02/2020    History of Present Illness 44 yo s/p CABG x 2 with left radial artery harvest on 9/14. PMhx: obesity, gastric sleeve, OSA, HTN, CAD, Afib   Clinical Impression   Pt is functioning modified independently in ADL. Educated in energy conservation strategies and sternal precautions related to ADL and IADL. No OT needs.    Follow Up Recommendations  No OT follow up    Equipment Recommendations  None recommended by OT    Recommendations for Other Services       Precautions / Restrictions Precautions Precautions: Sternal Precaution Comments: watch HR Restrictions Weight Bearing Restrictions: Yes      Mobility Bed Mobility               General bed mobility comments: pt in chair  Transfers Overall transfer level: Modified independent Equipment used: None             General transfer comment: uses momentum, hands on knees    Balance Overall balance assessment: No apparent balance deficits (not formally assessed)                                         ADL either performed or assessed with clinical judgement   ADL Overall ADL's : Modified independent                                       General ADL Comments: Educated in energy conservation and sternal precautions related to ADL and IADL.     Vision Patient Visual Report: No change from baseline       Perception     Praxis      Pertinent Vitals/Pain Pain Assessment: Faces Faces Pain Scale: Hurts little more Pain Location: L arm Pain Descriptors / Indicators: Burning Pain Intervention(s): Monitored during session     Hand Dominance Right   Extremity/Trunk Assessment Upper Extremity Assessment Upper Extremity Assessment: Overall WFL for tasks assessed   Lower Extremity Assessment Lower Extremity Assessment: Overall WFL  for tasks assessed   Cervical / Trunk Assessment Cervical / Trunk Assessment: Normal (obesity)   Communication Communication Communication: No difficulties   Cognition Arousal/Alertness: Awake/alert Behavior During Therapy: WFL for tasks assessed/performed Overall Cognitive Status: Within Functional Limits for tasks assessed                                     General Comments       Exercises     Shoulder Instructions      Home Living Family/patient expects to be discharged to:: Private residence Living Arrangements: Parent Available Help at Discharge: Family;Available 24 hours/day Type of Home: Mobile home Home Access: Stairs to enter Entrance Stairs-Number of Steps: 2 Entrance Stairs-Rails: None Home Layout: One level     Bathroom Shower/Tub: Producer, television/film/video: Standard     Home Equipment: None          Prior Functioning/Environment Level of Independence: Independent                 OT Problem List:        OT Treatment/Interventions:  OT Goals(Current goals can be found in the care plan section) Acute Rehab OT Goals Patient Stated Goal: return to fishing, work  OT Frequency:     Barriers to D/C:            Co-evaluation              AM-PAC OT "6 Clicks" Daily Activity     Outcome Measure Help from another person eating meals?: None Help from another person taking care of personal grooming?: None Help from another person toileting, which includes using toliet, bedpan, or urinal?: None Help from another person bathing (including washing, rinsing, drying)?: None Help from another person to put on and taking off regular upper body clothing?: None Help from another person to put on and taking off regular lower body clothing?: None 6 Click Score: 24   End of Session    Activity Tolerance: Patient tolerated treatment well Patient left: in chair;with call bell/phone within reach  OT Visit Diagnosis:  Pain                Time: 8250-5397 OT Time Calculation (min): 20 min Charges:  OT General Charges $OT Visit: 1 Visit OT Evaluation $OT Eval Low Complexity: 1 Low  [name], OTR/L Acute Rehabilitation Services Pager: [phone] Office: [phone]  [name] 07/02/2020, 10:06 AM

## 2020-07-02 NOTE — Progress Notes (Signed)
Mobility Specialist: Progress Note    07/02/20 1245  Mobility  Activity Ambulated in hall  Level of Assistance Independent  Assistive Device None  Distance Ambulated (ft) 1000 ft  Mobility Response Tolerated well  Mobility performed by Mobility specialist  Bed Position Chair  $Mobility charge 1 Mobility   Pre-Mobility: 105 HR, BP, SpO2 During Mobility: 115 HR, 97% SpO2 Post-Mobility: 109 HR, 156/90 BP, 97% SpO2  Pt had no c/o during ambulation.   [facility] Treazure Nery Mobility Specialist

## 2020-07-03 MED ORDER — FUROSEMIDE 40 MG PO TABS
40.0000 mg | ORAL_TABLET | Freq: Every day | ORAL | 0 refills | Status: DC
Start: 2020-07-03 — End: 2020-07-14

## 2020-07-03 MED ORDER — ISOSORBIDE MONONITRATE ER 30 MG PO TB24
15.0000 mg | ORAL_TABLET | Freq: Every day | ORAL | 0 refills | Status: DC
Start: 2020-07-03 — End: 2020-08-22

## 2020-07-03 MED ORDER — POTASSIUM CHLORIDE CRYS ER 20 MEQ PO TBCR: 20.0000 meq | EXTENDED_RELEASE_TABLET | Freq: Two times a day (BID) | ORAL | 0 refills | Status: DC

## 2020-07-03 MED ORDER — AMIODARONE HCL 200 MG PO TABS
200.0000 mg | ORAL_TABLET | Freq: Every day | ORAL | 0 refills | Status: DC
Start: 2020-07-03 — End: 2020-07-14

## 2020-07-03 MED ORDER — OXYCODONE HCL 5 MG PO TABS
5.0000 mg | ORAL_TABLET | ORAL | 0 refills | Status: DC | PRN
Start: 2020-07-03 — End: 2020-08-22

## 2020-07-03 MED ORDER — POTASSIUM CHLORIDE CRYS ER 20 MEQ PO TBCR: 20.0000 meq | EXTENDED_RELEASE_TABLET | Freq: Every day | ORAL | 0 refills | Status: DC

## 2020-07-03 MED ORDER — POTASSIUM CHLORIDE CRYS ER 20 MEQ PO TBCR
20.0000 meq | EXTENDED_RELEASE_TABLET | Freq: Every day | ORAL | Status: DC
  Administered 2020-07-03: 20 meq via ORAL

## 2020-07-03 MED ORDER — METOPROLOL TARTRATE 75 MG PO TABS: 75.0000 mg | ORAL_TABLET | Freq: Two times a day (BID) | ORAL | 1 refills | Status: DC

## 2020-07-03 MED ORDER — DIGOXIN 125 MCG PO TABS: 0.1250 mg | ORAL_TABLET | Freq: Every day | ORAL | 0 refills | Status: DC

## 2020-07-03 NOTE — Discharge Instructions (Signed)
Discharge Instructions:  1. You may shower, please wash incisions daily with soap and water and keep dry.  If you wish to cover wounds with dressing you may do so but please keep clean and change daily.  No tub baths or swimming until incisions have completely healed.  If your incisions become red or develop any drainage please call our office at [phone]  2. No Driving until cleared by Dr. Dennie Maizes office and you are no longer using narcotic pain medications  3. Monitor your weight daily.. Please use the same scale and weigh at same time... If you gain 5-10 lbs in 48 hours with associated lower extremity swelling, please contact our office at [phone]  4. Fever of 101.5 for at least 24 hours with no source, please contact our office at [phone]  5. Activity- up as tolerated, please walk at least 3 times per day.  Avoid strenuous activity, no lifting, pushing, or pulling with your arms over 8-10 lbs for a minimum of 6 weeks  6. If any questions or concerns arise, please do not hesitate to contact our office at [phone]  7. Patient instructed to use dry gauze and tape to LUE wound area that has minor bleeding (from staple being dislodged). He was instructed to  Change dressing daily and PRN. He was instructed to contact the office if bleeding increases or gets red.  -------------------------------------------------------------------------------------------------------------------------------------------------------------------------------------------------------- Information on my medicine - XARELTO (Rivaroxaban)  This medication education was reviewed with me or my healthcare representative as part of my discharge preparation.    Why was Xarelto prescribed for you? Xarelto was prescribed for you to reduce the risk of a blood clot forming that can cause a stroke if you have a medical condition called atrial fibrillation (a type of irregular heartbeat).  What do you need  to know about xarelto ? Take your Xarelto ONCE DAILY at the same time every day with your evening meal. If you have difficulty swallowing the tablet whole, you may crush it and mix in applesauce just prior to taking your dose.  Take Xarelto exactly as prescribed by your doctor and DO NOT stop taking Xarelto without talking to the doctor who prescribed the medication.  Stopping without other stroke prevention medication to take the place of Xarelto may increase your risk of developing a clot that causes a stroke.  Refill your prescription before you run out.  After discharge, you should have regular check-up appointments with your healthcare provider that is prescribing your Xarelto.  In the future your dose may need to be changed if your kidney function or weight changes by a significant amount.  What do you do if you miss a dose? If you are taking Xarelto ONCE DAILY and you miss a dose, take it as soon as you remember on the same day then continue your regularly scheduled once daily regimen the next day. Do not take two doses of Xarelto at the same time or on the same day.   Important Safety Information A possible side effect of Xarelto is bleeding. You should call your healthcare provider right away if you experience any of the following: ? Bleeding from an injury or your nose that does not stop. ? Unusual colored urine (red or dark brown) or unusual colored stools (red or black). ? Unusual bruising for unknown reasons. ? A serious fall or if you hit your head (even if there is no bleeding).  Some medicines may interact with Xarelto and might increase your risk of  bleeding while on Xarelto. To help avoid this, consult your healthcare provider or pharmacist prior to using any new prescription or non-prescription medications, including herbals, vitamins, non-steroidal anti-inflammatory drugs (NSAIDs) and supplements.  This website has more information on Xarelto: VisitDestination.com.br.

## 2020-07-03 NOTE — Progress Notes (Addendum)
      [address]       [name] [address]             [phone]       5 days post CABG x 2, LA clip 2 Days Post-Op Procedure(s) (LRB): CARDIOVERSION (N/A) TRANSESOPHAGEAL ECHOCARDIOGRAM (TEE) (N/A)  Subjective: Patient upset this am as he has called his mom, who always answers the phone, and is unable to get in touch with her. He called [phone] to check on her.  Objective: Vital signs in last 24 hours: Temp:  [97.7 F (36.5 C)-98.6 F (37 C)] 98.1 F (36.7 C) (09/19 0509) Pulse Rate:  [78-92] 80 (09/19 0509) Cardiac Rhythm: Normal sinus rhythm (09/18 1955) Resp:  [16-20] 17 (09/19 0509) BP: (120-156)/(71-90) 127/71 (09/19 0509) SpO2:  [95 %-98 %] 98 % (09/19 0509) Weight:  [173.4 kg] 173.4 kg (09/19 0505)  Pre op weight 174.6 kg Current Weight  07/03/20 (!) 173.4 kg      Intake/Output from previous day: 09/18 0701 - 09/19 0700 In: 240 [P.O.:240] Out: -    Physical Exam:  Cardiovascular: RRR Pulmonary: Slightly diminished bibasilar breath sounds Abdomen: Soft, obese, non tender, bowel sounds present. Extremities: Mild bilateral lower extremity edema.Motor/sensory intact Wounds: Sternal wound is clean and dry.  No erythema or signs of infection. LUE wound is clean and dry (minor bleeding where one staple dislodged)  Lab Results: CBC: No results for input(s): WBC, HGB, HCT, PLT in the last 72 hours. BMET:  Recent Labs    07/01/20 0300 07/02/20 0630  NA 140 140  K 3.2* 3.7  CL 106 105  CO2 26 26  GLUCOSE 112* 108*  BUN 6 9  CREATININE 0.75 0.81  CALCIUM 9.2 9.8    PT/INR:  Lab Results  Component Value Date   INR 2.0 (H) 07/01/2020   INR 1.2 06/27/2020   INR 1.3 (H) 06/27/2020   ABG:  INR: Will add last result for INR, ABG once components are confirmed Will add last 4 CBG results once components are confirmed  Assessment/Plan:  1. CV - Previous a fib with RVR/flutter. S/p TEE, cardioversion for a fib/flutter 09/17. Has had  ST at times with ambulation but overall, maintaining SR. On Amiodarone 400 mg bid, Digoxin 0.25 mg IV, Lopressor 75 mg bid (just increased last evening), and Imdur 15 mg daily. Will restart Xarelto and change Digoxin to oral but decrease to 0.125 mg daily 2.  Pulmonary - On room air. Encourage incentive spirometer. 3. Volume Overload - On Lasix 40 mg daily. As discussed with Dr. Tyrone Sage, will remain on Lasix until seen in the office 4. Patient instructed to use gauze and tape for LUE wound (small area where staple dislodged that has minor bleeding). 5. Discharge  Donielle M ZimmermanPA-C 07/03/2020,7:31 AM   I have seen and examined the patient and agree with the assessment and plan as outlined.  Maintaining NSR since DCCV.  Okay for d/c home.  Purcell Nails, MD 07/03/2020 10:08 AM

## 2020-07-04 ENCOUNTER — Telehealth: Payer: Self-pay | Admitting: Cardiology

## 2020-07-05 ENCOUNTER — Telehealth: Payer: Self-pay

## 2020-07-05 ENCOUNTER — Encounter (HOSPITAL_COMMUNITY): Payer: Self-pay | Admitting: Cardiology

## 2020-07-05 NOTE — Telephone Encounter (Signed)
Patient called complaint of left neck/ shoulder pain and oozing from right radial artery harvest site after CABG 9/13 with Dr. Tyrone Sage.  States that the drainage in reddish/brown in color and thin in consistency.  Arm is swollen and closed with staples, minimal pain.  Patient stated no fever, red streaks, pus, or warmth to touch.  Advised that drainage is not uncommon but should keep a watch on the area and give the office a call back if any sign/symptoms of infection are noted. Given that the site is swollen, with staples some drainage can be expected.    Dr. Tyrone Sage advised that the neck and shoulder pain was related to surgery and was, at this point, expected.  Advised to take pain medication if needed.  Also, advised to take over-the-counter melatonin up to 10 mg for sleep or Benadryl 25 mg as needed.  Heating pad to the neck and shoulder could be used and movement/ exercise would help.  Patient acknowledged receipt.

## 2020-07-06 ENCOUNTER — Telehealth: Payer: Self-pay

## 2020-07-06 NOTE — Telephone Encounter (Signed)
Received a call from patient regarding post op medications. On discharge paperwork, Benazepril and diltiazem are not included on the list. Should patient discontinue these medications? Please advise. Thanks!

## 2020-07-06 NOTE — Telephone Encounter (Signed)
Relayed information to patient. Patient also stated that the stiches on his forearm have been oozing a light brown/red liquid. Patient reports that the area is not painful, hot, or visibly irritated. Please advise. Thanks!

## 2020-07-06 NOTE — Telephone Encounter (Signed)
Hold them for now until seen by me

## 2020-07-08 DIAGNOSIS — Z736 Limitation of activities due to disability: Secondary | ICD-10-CM

## 2020-07-11 ENCOUNTER — Ambulatory Visit (INDEPENDENT_AMBULATORY_CARE_PROVIDER_SITE_OTHER): Payer: BC Managed Care – PPO | Admitting: Otolaryngology

## 2020-07-11 ENCOUNTER — Other Ambulatory Visit: Payer: Self-pay

## 2020-07-11 ENCOUNTER — Encounter (INDEPENDENT_AMBULATORY_CARE_PROVIDER_SITE_OTHER): Payer: Self-pay | Admitting: Otolaryngology

## 2020-07-11 VITALS — Temp 97.2°F

## 2020-07-11 DIAGNOSIS — H6123 Impacted cerumen, bilateral: Secondary | ICD-10-CM | POA: Diagnosis not present

## 2020-07-11 NOTE — Progress Notes (Signed)
HPI: Joseph Lang is a 44 y.o. male who presents for evaluation of right ear complaints.  He complains of sensation of fullness and something in the right ear.  Just developed this sensation yesterday.  He recently had open heart surgery. Has minimal nasal symptoms..  Past Medical History:  Diagnosis Date  . Coronary artery disease 2021  . Dysrhythmia 2021   Afib  . GERD (gastroesophageal reflux disease)   . Hx of deep vein thrombophlebitis of lower extremity   . Hx of gastric bypass   . Hx of pulmonary embolus   . Hypertension   . Obesity   . PONV (postoperative nausea and vomiting)    Throwing up after wt loss surgery  . S/P CABG x 2 06/28/2020: LIMA to LAD, Free Radial graft to D1. LAA ligation with 40 mm Atricure. 06/27/2020  . Sleep apnea   . Tobacco abuse    Past Surgical History:  Procedure Laterality Date  . CARDIAC CATHETERIZATION Left 2021  . CARDIOVERSION    . CARDIOVERSION N/A 04/19/2020   Procedure: CARDIOVERSION;  Surgeon: Yates Decamp, MD;  Location: [facility] ENDOSCOPY;  Service: Cardiovascular;  Laterality: N/A;  . CARDIOVERSION N/A 04/26/2020   Procedure: CARDIOVERSION;  Surgeon: Yates Decamp, MD;  Location: [facility] ENDOSCOPY;  Service: Cardiovascular;  Laterality: N/A;  . CARDIOVERSION N/A 07/01/2020   Procedure: CARDIOVERSION;  Surgeon: Yates Decamp, MD;  Location: [facility] ENDOSCOPY;  Service: Cardiovascular;  Laterality: N/A;  . CLIPPING OF ATRIAL APPENDAGE N/A 06/27/2020   Procedure: CLIPPING OF ATRIAL APPENDAGE USING ATRICURE CLIP SIZE 40;  Surgeon: Delight Ovens, MD;  Location: [facility] OR;  Service: Open Heart Surgery;  Laterality: N/A;  . CORONARY ARTERY BYPASS GRAFT N/A 06/27/2020   Procedure: CORONARY ARTERY BYPASS GRAFTING (CABG) X TWO, USING LEFT INTERNAL MAMMARY ARTERY AND LEFT ARM RADIAL ARTERY;  Surgeon: Delight Ovens, MD;  Location: [facility] OR;  Service: Open Heart Surgery;  Laterality: N/A;  . FRACTURE SURGERY  1995   pelvis  . HIP SURGERY    . LAPAROSCOPIC GASTRIC SLEEVE  RESECTION N/A 02/14/2015   Procedure: LAPAROSCOPIC GASTRIC SLEEVE RESECTION WITH UPPER ENDOSCOPY;  Surgeon: Luretha Murphy, MD;  Location: [facility] ORS;  Service: General;  Laterality: N/A;  . LEFT HEART CATH AND CORONARY ANGIOGRAPHY N/A 04/19/2020   Procedure: LEFT HEART CATH AND CORONARY ANGIOGRAPHY;  Surgeon: Yates Decamp, MD;  Location: [facility] INVASIVE CV LAB;  Service: Cardiovascular;  Laterality: N/A;  . PELVIS CLOSED REDUCTION    . RADIAL ARTERY HARVEST Left 06/27/2020   Procedure: LEFT RADIAL ARTERY HARVEST;  Surgeon: Delight Ovens, MD;  Location: [facility] OR;  Service: Open Heart Surgery;  Laterality: Left;  . TEE WITHOUT CARDIOVERSION N/A 06/27/2020   Procedure: TRANSESOPHAGEAL ECHOCARDIOGRAM (TEE);  Surgeon: Delight Ovens, MD;  Location: [facility] OR;  Service: Open Heart Surgery;  Laterality: N/A;  . TEE WITHOUT CARDIOVERSION N/A 07/01/2020   Procedure: TRANSESOPHAGEAL ECHOCARDIOGRAM (TEE);  Surgeon: Yates Decamp, MD;  Location: [facility] ENDOSCOPY;  Service: Cardiovascular;  Laterality: N/A;   Social History   Socioeconomic History  . Marital status: Single    Spouse name: Not on file  . Number of children: 0  . Years of education: Not on file  . Highest education level: Not on file  Occupational History  . Not on file  Tobacco Use  . Smoking status: Former Smoker    Packs/day: 0.50    Years: 28.00    Pack years: 14.00    Types: Cigarettes    Quit date:  04/20/2020    Years since quitting: 0.2  . Smokeless tobacco: Never Used  . Tobacco comment: half a pack daily  Vaping Use  . Vaping Use: Never used  Substance and Sexual Activity  . Alcohol use: Not Currently  . Drug use: No  . Sexual activity: Not Currently  Other Topics Concern  . Not on file  Social History Narrative   ** Merged History Encounter **       Social Determinants of Health   Financial Resource Strain:   . Difficulty of Paying Living Expenses: Not on file  Food Insecurity:   . Worried About Programme researcher, broadcasting/film/video in the Last  Year: Not on file  . Ran Out of Food in the Last Year: Not on file  Transportation Needs:   . Lack of Transportation (Medical): Not on file  . Lack of Transportation (Non-Medical): Not on file  Physical Activity:   . Days of Exercise per Week: Not on file  . Minutes of Exercise per Session: Not on file  Stress:   . Feeling of Stress : Not on file  Social Connections:   . Frequency of Communication with Friends and Family: Not on file  . Frequency of Social Gatherings with Friends and Family: Not on file  . Attends Religious Services: Not on file  . Active Member of Clubs or Organizations: Not on file  . Attends Banker Meetings: Not on file  . Marital Status: Not on file   Family History  Problem Relation Age of Onset  . Atrial fibrillation Mother   . CAD Mother   . Heart disease Mother   . CAD Father   . Atrial fibrillation Father    Allergies  Allergen Reactions  . Amoxicillin Nausea And Vomiting   Prior to Admission medications   Medication Sig Start Date End Date Taking? Authorizing Provider  amiodarone (PACERONE) 200 MG tablet Take 1 tablet (200 mg total) by mouth daily. Take 200 mg bid for one week;then take 200 mg daily thereafter 07/03/20  Yes Doree Fudge M, PA-C  aspirin EC 81 MG tablet Take 1 tablet (81 mg total) by mouth daily. 04/23/20  Yes Yates Decamp, MD  atorvastatin (LIPITOR) 80 MG tablet Take 1 tablet (80 mg total) by mouth daily. 04/23/20 07/22/20 Yes Yates Decamp, MD  digoxin (LANOXIN) 0.125 MG tablet Take 1 tablet (0.125 mg total) by mouth daily. 07/03/20  Yes Doree Fudge M, PA-C  furosemide (LASIX) 40 MG tablet Take 1 tablet (40 mg total) by mouth daily. 07/03/20  Yes Doree Fudge M, PA-C  isosorbide mononitrate (IMDUR) 30 MG 24 hr tablet Take 0.5 tablets (15 mg total) by mouth daily. Please take until finished 07/03/20  Yes Doree Fudge M, PA-C  Metoprolol Tartrate 75 MG TABS Take 75 mg by mouth 2 (two) times daily. 07/03/20   Yes Doree Fudge M, PA-C  oxyCODONE (OXY IR/ROXICODONE) 5 MG immediate release tablet Take 1 tablet (5 mg total) by mouth every 4 (four) hours as needed for severe pain. 07/03/20  Yes Doree Fudge M, PA-C  potassium chloride SA (KLOR-CON) 20 MEQ tablet Take 1 tablet (20 mEq total) by mouth daily. 07/03/20  Yes Doree Fudge M, PA-C  RABEprazole (ACIPHEX) 20 MG tablet Take 20 mg by mouth daily.  01/04/20  Yes [provider]  XARELTO 20 MG TABS tablet Take 20 mg by mouth daily. 01/06/20  Yes [provider]     Positive ROS: Otherwise negative.  Denies any throat  symptoms  All other systems have been reviewed and were otherwise negative with the exception of those mentioned in the HPI and as above.  Physical Exam: Constitutional: Alert, well-appearing, no acute distress Ears: External ears without lesions or tenderness. Ear canals with minimal wax buildup in both ears.  This was cleaned with curettes as well as suction.  The right ear canal was with peroxide at patient's request.  The TMs were clear bilaterally with good mobility pneumatic otoscopy.  On hearing screening he heard well in both ears with a tuning forks with AC > BC bilaterally.. Nasal: External nose without lesions. Clear nasal passages bilaterally. Oral: Oropharynx clear. Neck: No palpable adenopathy or masses Respiratory: Breathing comfortably  Skin: No facial/neck lesions or rash noted.  Cerumen impaction removal  Date/Time: 07/11/2020 11:19 AM Performed by: Drema Halon, MD Authorized by: Drema Halon, MD   Consent:    Consent obtained:  Verbal   Consent given by:  Patient   Risks discussed:  Pain and bleeding Procedure details:    Location:  L ear and R ear   Procedure type: curette and suction   Post-procedure details:    Inspection:  TM intact and canal normal   Hearing quality:  Improved   Patient tolerance of procedure:  Tolerated well, no immediate  complications Comments:     Patient with only minimal wax buildup.  TMs were clear bilaterally with normal hearing on tuning fork testing.    Assessment: Minimal wax buildup.  After cleaning the ears he complained of some sensation of fullness in the right ear.  Plan: Even after cleaning the ears he complained of some fullness in the right ear.  Nasal passages were clear and oral exam was clear. If sensation does not resolve in the next week he will call back to schedule hearing test.  Narda Bonds, MD

## 2020-07-13 ENCOUNTER — Ambulatory Visit (INDEPENDENT_AMBULATORY_CARE_PROVIDER_SITE_OTHER): Payer: Self-pay

## 2020-07-13 ENCOUNTER — Other Ambulatory Visit: Payer: Self-pay

## 2020-07-13 DIAGNOSIS — Z4802 Encounter for removal of sutures: Secondary | ICD-10-CM

## 2020-07-13 DIAGNOSIS — Z951 Presence of aortocoronary bypass graft: Secondary | ICD-10-CM

## 2020-07-14 ENCOUNTER — Ambulatory Visit: Payer: BC Managed Care – PPO | Admitting: Cardiology

## 2020-07-14 ENCOUNTER — Encounter: Payer: Self-pay | Admitting: Cardiology

## 2020-07-14 VITALS — BP 143/77 | HR 70 | Resp 15 | Ht 74.0 in | Wt 380.0 lb

## 2020-07-14 DIAGNOSIS — I10 Essential (primary) hypertension: Secondary | ICD-10-CM

## 2020-07-14 DIAGNOSIS — I480 Paroxysmal atrial fibrillation: Secondary | ICD-10-CM

## 2020-07-14 DIAGNOSIS — M62838 Other muscle spasm: Secondary | ICD-10-CM

## 2020-07-14 DIAGNOSIS — I25118 Atherosclerotic heart disease of native coronary artery with other forms of angina pectoris: Secondary | ICD-10-CM

## 2020-07-14 MED ORDER — METOPROLOL TARTRATE 100 MG PO TABS: 100.0000 mg | ORAL_TABLET | Freq: Two times a day (BID) | ORAL | 3 refills | Status: DC

## 2020-07-14 MED ORDER — AMIODARONE HCL 200 MG PO TABS: 100.0000 mg | ORAL_TABLET | Freq: Every day | ORAL | 0 refills | Status: DC

## 2020-07-14 MED ORDER — BACLOFEN 20 MG PO TABS: 20.0000 mg | ORAL_TABLET | Freq: Two times a day (BID) | ORAL | 0 refills | Status: DC | PRN

## 2020-07-14 NOTE — Progress Notes (Signed)
Primary Physician/Referring:  Georgann HousekeeperHusain, Karrar, MD  Patient ID: Italyhad E Laufer, male    DOB: 30-Nov-1975, 44 y.o.   MRN: [id number]  No chief complaint on file.  HPI:   Visit date not found  Italyhad E Kelli ChurnLevens  is a 44 y.o. Caucasian male patient with past medical history of paroxysmal atrial fibrillation and cardioversion on 01/17/2020, hypertension, morbid obesity, obstructive sleep apnea on CPAP, tobacco use disorder, history of acute deep vein thrombosis and pulmonary embolism on 04/08/2015 following gastric sleeve resection and again presented with spontaneous right leg DVT on 03/07/2019 presently on long termXarelto.  Due to multivessel coronary disease on 04/19/2020 angiography, he was evaluated by Dr. Elise BenneEd Gerhart and underwent CABG x 2 and LAA ligation on 06/27/2020. Post operatively developed atrial fibrillation with RVR. I performed TEE guided direct-current cardioversion on 07/01/2020 and he was discharged home the following morning with sinus rhythm, he was also started on digoxin for rate control.  He now presents to the office, doing well and states that except for his neck spasm since surgery, he is recuperating well, he has lost additional 15 pounds in weight since surgery and appears to be motivated for weight loss.  He is tolerating all his medications well.  States that he has not had any further rapid heartbeat or fatigue or chest pain.  He has mild discharge from his left forearm arterial harvest site but no fever or chills.  Past Medical History:  Diagnosis Date  . Coronary artery disease 2021  . Dysrhythmia 2021   Afib  . GERD (gastroesophageal reflux disease)   . Hx of deep vein thrombophlebitis of lower extremity   . Hx of gastric bypass   . Hx of pulmonary embolus   . Hypertension   . Obesity   . PONV (postoperative nausea and vomiting)    Throwing up after wt loss surgery  . S/P CABG x 2 06/28/2020: LIMA to LAD, Free Radial graft to D1. LAA ligation with 40 mm Atricure.  06/27/2020  . Sleep apnea   . Tobacco abuse    Past Surgical History:  Procedure Laterality Date  . CARDIAC CATHETERIZATION Left 2021  . CARDIOVERSION    . CARDIOVERSION N/A 04/19/2020   Procedure: CARDIOVERSION;  Surgeon: Yates DecampGanji, Frederic Tones, MD;  [facility];  Service: Cardiovascular;  Laterality: N/A;  . CARDIOVERSION N/A 04/26/2020   Procedure: CARDIOVERSION;  Surgeon: Yates DecampGanji, Alto Gandolfo, MD;  [facility];  Service: Cardiovascular;  Laterality: N/A;  . CARDIOVERSION N/A 07/01/2020   Procedure: CARDIOVERSION;  Surgeon: Yates DecampGanji, Mylisa Brunson, MD;  [facility];  Service: Cardiovascular;  Laterality: N/A;  . CLIPPING OF ATRIAL APPENDAGE N/A 06/27/2020   Procedure: CLIPPING OF ATRIAL APPENDAGE USING ATRICURE CLIP SIZE 40;  Surgeon: Delight OvensGerhardt, Edward B, MD;  [facility];  Service: Open Heart Surgery;  Laterality: N/A;  . CORONARY ARTERY BYPASS GRAFT N/A 06/27/2020   Procedure: CORONARY ARTERY BYPASS GRAFTING (CABG) X TWO, USING LEFT INTERNAL MAMMARY ARTERY AND LEFT ARM RADIAL ARTERY;  Surgeon: Delight OvensGerhardt, Edward B, MD;  Location: [facility] OR;  Service: Open Heart Surgery;  Laterality: N/A;  . FRACTURE SURGERY  1995   pelvis  . HIP SURGERY    . LAPAROSCOPIC GASTRIC SLEEVE RESECTION N/A 02/14/2015   Procedure: LAPAROSCOPIC GASTRIC SLEEVE RESECTION WITH UPPER ENDOSCOPY;  Surgeon: Luretha MurphyMatthew Martin, MD;  Location: [facility] ORS;  Service: General;  Laterality: N/A;  . LEFT HEART CATH AND CORONARY ANGIOGRAPHY N/A 04/19/2020   Procedure: LEFT HEART CATH AND CORONARY ANGIOGRAPHY;  Surgeon:  Yates Decamp, MD;  Location: [facility] INVASIVE CV LAB;  Service: Cardiovascular;  Laterality: N/A;  . PELVIS CLOSED REDUCTION    . RADIAL ARTERY HARVEST Left 06/27/2020   Procedure: LEFT RADIAL ARTERY HARVEST;  Surgeon: Delight Ovens, MD;  Location: [facility] OR;  Service: Open Heart Surgery;  Laterality: Left;  . TEE WITHOUT CARDIOVERSION N/A 06/27/2020   Procedure: TRANSESOPHAGEAL ECHOCARDIOGRAM (TEE);  Surgeon: Delight Ovens, MD;   Location: [facility] OR;  Service: Open Heart Surgery;  Laterality: N/A;  . TEE WITHOUT CARDIOVERSION N/A 07/01/2020   Procedure: TRANSESOPHAGEAL ECHOCARDIOGRAM (TEE);  Surgeon: Yates Decamp, MD;  Location: [facility] ENDOSCOPY;  Service: Cardiovascular;  Laterality: N/A;   Family History  Problem Relation Age of Onset  . Atrial fibrillation Mother   . CAD Mother   . Heart disease Mother   . CAD Father   . Atrial fibrillation Father     Social History   Tobacco Use  . Smoking status: Former Smoker    Packs/day: 0.50    Years: 28.00    Pack years: 14.00    Types: Cigarettes    Quit date: 04/20/2020    Years since quitting: 0.2  . Smokeless tobacco: Never Used  . Tobacco comment: half a pack daily  Substance Use Topics  . Alcohol use: Not Currently   Marital Status: Single  ROS  Review of Systems  Cardiovascular: Positive for dyspnea on exertion. Negative for chest pain and leg swelling.  Gastrointestinal: Negative for melena.  Psychiatric/Behavioral: Positive for depression. The patient is nervous/anxious.    Objective  Blood pressure (!) 143/77, pulse 70, resp. rate 15, height  (1.88 m), weight (!) 380 lb (172.4 kg), SpO2 95 %.  Vitals with BMI 07/14/2020 07/03/2020 07/03/2020  Height  - -  Weight 380 lbs - 382 lbs 3 oz  BMI 48.77 - 49.05  Systolic 143 127 -  Diastolic 77 71 -  Pulse 70 80 -     Physical Exam Constitutional:      Comments: He is well-built and morbidly obese in no acute distress.  Cardiovascular:     Rate and Rhythm: Normal rate and regular rhythm.     Pulses: Normal pulses and intact distal pulses.     Heart sounds: No murmur heard.  No gallop. No S3 or S4 sounds.      Comments: No JVD. Chronic venous stasis pigmentation of bilateral lower extremity.  Bilateral 1 leg edema present, pitting. Pulmonary:     Effort: Pulmonary effort is normal.     Breath sounds: Normal breath sounds.  Abdominal:     General: Bowel sounds are normal.     Palpations: Abdomen  is soft.  Musculoskeletal:        General: Swelling (left arm. Nearly dry small 2-3 mm wound at arterial access site noted left forearm.) present.    Laboratory examination:   Recent Labs    06/30/20 0500 07/01/20 0300 07/02/20 0630  NA 139 140 140  K 3.4* 3.2* 3.7  CL 105 106 105  CO2 GLUCOSE 135* 112* 108*  BUN 5* 6 9  CREATININE 0.78 0.75 0.81  CALCIUM 9.5 9.2 9.8  GFRNONAA >60 >60 >60  GFRAA >60 >60 >60   estimated creatinine clearance is 190.7 mL/min (by C-G formula based on SCr of 0.81 mg/dL).  CMP Latest Ref Rng & Units 07/02/2020 07/01/2020 06/30/2020  Glucose 70 - 99 mg/dL 409(W) 119(J) 478(G)  BUN 6 - 20 mg/dL 9 6  5(L)  Creatinine 0.61 - 1.24 mg/dL 1.61 0.96 0.45  Sodium 135 - 145 mmol/L 140 140 139  Potassium 3.5 - 5.1 mmol/L 3.7 3.2(L) 3.4(L)  Chloride 98 - 111 mmol/L 105 106 105  CO2 22 - 32 mmol/L Calcium 8.9 - 10.3 mg/dL 9.8 9.2 9.5  Total Protein 6.5 - 8.1 g/dL - - -  Total Bilirubin 0.3 - 1.2 mg/dL - - -  Alkaline Phos 38 - 126 U/L - - -  AST 15 - 41 U/L - - -  ALT 0 - 44 U/L - - -   CBC Latest Ref Rng & Units 06/30/2020 06/29/2020 06/28/2020  WBC 4.0 - 10.5 K/uL 13.7(H) 17.2(H) 14.6(H)  Hemoglobin 13.0 - 17.0 g/dL 12.9(L) 12.9(L) 11.6(L)  Hematocrit 39 - 52 % 40.0 39.9 36.2(L)  Platelets 150 - 400 K/uL 239 222 183   Lipid Panel No results for input(s): CHOL, TRIG, LDLCALC, VLDL, HDL, CHOLHDL, LDLDIRECT in the last 8760 hours.  HEMOGLOBIN A1C Lab Results  Component Value Date   HGBA1C 5.5 06/23/2020   MPG 111.15 06/23/2020   TSH Recent Labs    01/17/20 0813  TSH 1.564   External labs:   Cholesterol, total 230.000 11/03/2019 HDL 42.000 11/03/2019 LDL-C 158.000 11/03/2019 Triglycerides 164.000 11/03/2019  A1C 5.500 11/03/2019 TSH 1.564 01/17/2020  Medications and allergies   Allergies  Allergen Reactions  . Amoxicillin Nausea And Vomiting    Outpatient Medications Prior to Visit  Medication Sig Dispense Refill  .  aspirin EC 81 MG tablet Take 1 tablet (81 mg total) by mouth daily. 90 tablet 3  . atorvastatin (LIPITOR) 80 MG tablet Take 1 tablet (80 mg total) by mouth daily. 30 tablet 2  . isosorbide mononitrate (IMDUR) 30 MG 24 hr tablet Take 0.5 tablets (15 mg total) by mouth daily. Please take until finished 25 tablet 0  . oxyCODONE (OXY IR/ROXICODONE) 5 MG immediate release tablet Take 1 tablet (5 mg total) by mouth every 4 (four) hours as needed for severe pain. 30 tablet 0  . RABEprazole (ACIPHEX) 20 MG tablet Take 20 mg by mouth daily.     Carlena Hurl 20 MG TABS tablet Take 20 mg by mouth daily.    Marland Kitchen amiodarone (PACERONE) 200 MG tablet Take 1 tablet (200 mg total) by mouth daily. Take 200 mg bid for one week;then take 200 mg daily thereafter 60 tablet 0  . digoxin (LANOXIN) 0.125 MG tablet Take 1 tablet (0.125 mg total) by mouth daily. 30 tablet 0  . Metoprolol Tartrate 75 MG TABS Take 75 mg by mouth 2 (two) times daily. 60 tablet 1  . potassium chloride SA (KLOR-CON) 20 MEQ tablet Take 1 tablet (20 mEq total) by mouth daily. 30 tablet 0  . furosemide (LASIX) 40 MG tablet Take 1 tablet (40 mg total) by mouth daily. (Patient not taking: Reported on 07/14/2020) 30 tablet 0   No facility-administered medications prior to visit.   Meds ordered this encounter  Medications  . baclofen (LIORESAL) 20 MG tablet    Sig: Take 1 tablet (20 mg total) by mouth 2 (two) times daily as needed for muscle spasms.    Dispense:  30 each    Refill:  0  . amiodarone (PACERONE) 200 MG tablet    Sig: Take 0.5 tablets (100 mg total) by mouth daily. Take 200 mg bid for one week;then take 200 mg daily thereafter    Dispense:  60 tablet    Refill:  0  .  metoprolol tartrate (LOPRESSOR) 100 MG tablet    Sig: Take 1 tablet (100 mg total) by mouth 2 (two) times daily.    Dispense:  180 tablet    Refill:  3   Medications Discontinued During This Encounter  Medication Reason  . furosemide (LASIX) 40 MG tablet No longer needed  (for PRN medications)  . digoxin (LANOXIN) 0.125 MG tablet Completed Course  . potassium chloride SA (KLOR-CON) 20 MEQ tablet No longer needed (for PRN medications)  . amiodarone (PACERONE) 200 MG tablet   . Metoprolol Tartrate 75 MG TABS Dose change      Radiology:   PORTABLE CHEST 1 VIEW 01/17/2020 COMPARISON:  04/08/2015 Cardiac silhouette is normal in size. No mediastinal or hilar masses. No evidence of adenopathy. Lungs are clear.  No pleural effusion or pneumothorax. Skeletal structures are grossly intact.  Cardiac Studies:   Echocardiogram: 02/29/2020: LVEF 55-60%, mildly dilated left ventricular cavity, mild LVH, normal diastolic filling pattern, normal left atrial pressure, mildly dilated left atrium, mild MR, trace TR.  Stress Testing: Lexiscan Sestamibi stress test 02/29/2020: Stress EKG at 78% MPHR shows sinus tachycardia, nonspecific T wave inversion in inferior leads, frequent PVC's.  SPECT images show large sized, medium intensity, predominantly reversible perfusion defect in basal to apical, inferoseptal, inferior, and inferolateral myocadium. While study quality may be limited due to attenuation (BMI 52), ischemia cannot be excluded. Stress LVEF 65%. Recommend clinical correlation. High risk study.   Vascular imaging: Ultrasound venous duplex: 03/07/2019: Right: Findings consistent with acute deep vein thrombosis involving the right popliteal vein. There is evidence of superficial vein thrombosis involving a varicose vein of the proximal, medial, right calf.  Left: No evidence of common femoral vein obstruction.  Left Heart Catheterization 04/19/20:  LV: Moderately elevated LVEDP.  LM: Mid 40%. Hazy. Severe diffuse multivessel disease. Ectasia of the LAD, RCA. Prox LAD aneurysmal. Occluded after origin of a very large D1. D1 is very large with mid 90% stenosis. Ipsilateral and contralateral (RCA) to mid LAD noted.  Cx: Prox 70-80% stenosis involves the bifurcation  of large OM-1. At the bifurcation there is at least a 80% stenosis. RCA: Diffusely ectatic. PL branch has a focal 50% stenosis.  Slow flow noted in the RCA.   Rec: Complex multivessel CAD and will benefit from CABG to LAD and D1 and OM1 with arterial conduits. Maze and LAA ligation. Patient went into atrial fibrillation in the cath lab. 70 mL contrast used. IV dilt and metoprolol given but did not convert to sinus. He will resume Xarelto tonight. Patient wants to come in and discuss personally at the office. Not happy about CABG potential.  EKG  EKG 05/16/2020: Normal sinus rhythm with rate of 62 bpm, normal axis, anteroseptal infarct old.  Low-voltage complexes.  Pulmonary disease pattern.  No significant change from EKG 03/31/2020.  Assessment     ICD-10-CM   1. Coronary artery disease involving native coronary artery of native heart with other form of angina pectoris (HCC)  I25.118 metoprolol tartrate (LOPRESSOR) 100 MG tablet    AMB referral to cardiac rehabilitation  2. Paroxysmal atrial fibrillation (HCC). CHA2DS2-VASc Score is 2.  Yearly risk of stroke: 2.3% (CAD, HTN).   I48.0 amiodarone (PACERONE) 200 MG tablet    metoprolol tartrate (LOPRESSOR) 100 MG tablet  3. Primary hypertension  I10   4. Muscle spasms of neck  M62.838 baclofen (LIORESAL) 20 MG tablet    CHA2DS2-VASc Score is 2.  Yearly risk of stroke: 2.3% (CAD, HTN).  Score  of 1=1.3; 2=2.2; 3=3.2; 4=4; 5=6.7; 6=9.8; 7=>9.8) -(CHF; HTN; vasc disease DM,  Male = 1; Age <65 =0; 65-74 = 1,  >75 =2; stroke = 2).   Meds ordered this encounter  Medications  . baclofen (LIORESAL) 20 MG tablet    Sig: Take 1 tablet (20 mg total) by mouth 2 (two) times daily as needed for muscle spasms.    Dispense:  30 each    Refill:  0  . amiodarone (PACERONE) 200 MG tablet    Sig: Take 0.5 tablets (100 mg total) by mouth daily. Take 200 mg bid for one week;then take 200 mg daily thereafter    Dispense:  60 tablet    Refill:  0  . metoprolol  tartrate (LOPRESSOR) 100 MG tablet    Sig: Take 1 tablet (100 mg total) by mouth 2 (two) times daily.    Dispense:  180 tablet    Refill:  3    Medications Discontinued During This Encounter  Medication Reason  . furosemide (LASIX) 40 MG tablet No longer needed (for PRN medications)  . digoxin (LANOXIN) 0.125 MG tablet Completed Course  . potassium chloride SA (KLOR-CON) 20 MEQ tablet No longer needed (for PRN medications)  . amiodarone (PACERONE) 200 MG tablet   . Metoprolol Tartrate 75 MG TABS Dose change    Recommendations:   Italy E Laredo  is a 44 y.o. Caucasian male patient with past medical history of paroxysmal atrial fibrillation and cardioversion on 01/17/2020, hypertension, morbid obesity, obstructive sleep apnea on CPAP, tobacco use disorder, history of acute deep vein thrombosis and pulmonary embolism on 04/08/2015 following gastric sleeve resection and again presented with spontaneous right leg DVT on 03/07/2019 presently on long termXarelto.  Due to multivessel coronary disease on 04/19/2020 angiography, he was evaluated by Dr. Elise Benne and underwent CABG x 2 and LAA ligation on 06/27/2020. Post operatively developed atrial fibrillation with RVR. I performed TEE guided direct-current cardioversion on 07/01/2020 and he was discharged home the following morning with sinus rhythm, he was also started on digoxin for rate control.  He is presently doing well and has lost weight and I encouraged him to continue to lose weight.  There is no clinical evidence of heart failure, he has very mild discharge from his left forearm which appears to be mostly dry, no evidence of cellulitis.  He can continue dry dressing.  He is maintaining sinus rhythm.  We will decrease amiodarone to 100 mg daily, eventual plans to discontinue the medication as he is only 44 years of age.  Discontinue digoxin, blood pressure is elevated which was soft in the hospital, will increase metoprolol tartrate from 75 mg twice  daily to 100 mg p.o. twice daily.  He has discontinued furosemide as he has not had any further leg swelling I also discontinued his potassium supplements.  Hopefully the change will also help with blood pressure control and also maintain sinus rhythm.  He is extremely symptomatic with A. fib onset.  I will see him back in 4 weeks for follow-up.  We will make a referral for cardiac rehab.  He has remained abstinent from tobacco use and also has not been drinking alcohol, previously was drinking 5-6 hard liquor shots a day.  He wants to start drinking a glass of wine a day which should be all right however I warned him that he could easily slip back into drinking excessive amounts of alcohol.  States that he does not want to do this.  Yates Decamp, MD, [facility] 07/14/2020, 4:44 PM Office: [phone]

## 2020-07-14 NOTE — Progress Notes (Signed)
Removed 2 chest tube sutures from chest tube incision sites. No signs of infection and patient tolerated well Removed 35 staples from left radial artery harvest site no signs of infection patient barely tolerated. He was C/O bright red blood in his stool x 1 week. He has been constipated since the surgery. I recommended a stool softener daily, Miralax BID and to increase his water intake daily. He is scheduled to see Dr Tyrone Sage next week with a CXR

## 2020-07-18 ENCOUNTER — Ambulatory Visit (INDEPENDENT_AMBULATORY_CARE_PROVIDER_SITE_OTHER): Payer: Self-pay | Admitting: Physician Assistant

## 2020-07-18 ENCOUNTER — Ambulatory Visit
Admission: RE | Admit: 2020-07-18 | Discharge: 2020-07-18 | Disposition: A | Discharge status: Home / Self Care | Payer: BC Managed Care – PPO | Source: Ambulatory Visit | Attending: Physician Assistant | Admitting: Physician Assistant

## 2020-07-18 ENCOUNTER — Other Ambulatory Visit: Payer: Self-pay | Admitting: *Deleted

## 2020-07-18 ENCOUNTER — Other Ambulatory Visit: Payer: Self-pay

## 2020-07-18 VITALS — BP 135/75 | HR 62 | Temp 98.6°F | Resp 20 | Ht 74.0 in | Wt 386.2 lb

## 2020-07-18 DIAGNOSIS — M7989 Other specified soft tissue disorders: Secondary | ICD-10-CM

## 2020-07-18 DIAGNOSIS — Z951 Presence of aortocoronary bypass graft: Secondary | ICD-10-CM

## 2020-07-18 DIAGNOSIS — I82622 Acute embolism and thrombosis of deep veins of left upper extremity: Secondary | ICD-10-CM

## 2020-07-18 NOTE — Progress Notes (Signed)
HPI:  Joseph Lang is status post elective two-vessel coronary bypass grafting by Dr. Tyrone Sage on 06/28/2019.  He had a left internal mammary artery grafted to the left anterior descending coronary artery and open harvest of left radial artery which was anastomosed to the diagonal coronary artery harvest incision.  He also had a clip placed on the left atrial appendage for history of paroxysmal atrial fibrillation. He was advised to come in for evaluation after he called the office earlier today describing redness and drainage left arm radial harvest incision.  After he arrived, he explained that he had noted swelling in his forearm since he left the hospital.  This morning, he applied some pressure along both sides of the midpoint of the incision and expressed some fluid from 1 to 2 mm scabbed area along the incision.  He said he continued pressing on the incision and express fluid 1 drop to the time for several minutes and said he felt some relief from the swelling.  He denies seeing any redness or purulent drainage.  He is most concerned with amount of swelling in his forearm.   He does have some mild pain in his forearm that he said tracks laterally to the incision to his thumb.  He had several new complaints while he was here in the office including a clicking sensation about 6 cm to the right of his mid sternum along with clicking sensations in his back and shoulders.   Current Outpatient Medications  Medication Sig Dispense Refill  . amiodarone (PACERONE) 200 MG tablet Take 0.5 tablets (100 mg total) by mouth daily. Take 200 mg bid for one week;then take 200 mg daily thereafter 60 tablet 0  . aspirin EC 81 MG tablet Take 1 tablet (81 mg total) by mouth daily. 90 tablet 3  . atorvastatin (LIPITOR) 80 MG tablet Take 1 tablet (80 mg total) by mouth daily. 30 tablet 2  . baclofen (LIORESAL) 20 MG tablet Take 1 tablet (20 mg total) by mouth 2 (two) times daily as needed for muscle spasms. 30 each 0   . isosorbide mononitrate (IMDUR) 30 MG 24 hr tablet Take 0.5 tablets (15 mg total) by mouth daily. Please take until finished 25 tablet 0  . metoprolol tartrate (LOPRESSOR) 100 MG tablet Take 1 tablet (100 mg total) by mouth 2 (two) times daily. 180 tablet 3  . oxyCODONE (OXY IR/ROXICODONE) 5 MG immediate release tablet Take 1 tablet (5 mg total) by mouth every 4 (four) hours as needed for severe pain. 30 tablet 0  . RABEprazole (ACIPHEX) 20 MG tablet Take 20 mg by mouth daily.     Joseph Lang 20 MG TABS tablet Take 20 mg by mouth daily.     No current facility-administered medications for this visit.    Physical Exam  VS: Temperature 98.6 Pulse 62 Respirations 20 Blood pressure 135/75 Weight 396 pounds O2 saturation 93% on room air  Heart: Regular rate and rhythm Chest: Breath sounds are clear.  Sternum stable.  Sternotomy incision is healing with no sign of complication.  I did not sense any sternal instability.  He is tender over the right costal-chondral junction at the level of mid sternum.. Extremities: All are warm and well-perfused.  He has obvious increase in the girth of his left forearm compared with the right.  The staples were removed from the left forearm incision.  Incision is intact and dry.  There is a small scab.  2 mm near the top third incision.  He said this is where drainage was being expressed earlier today.  It is currently dry.  The forearm is  soft to palpation.  I do not feel an obvious fluid collection but due to his large body habitus, a significant fluid collection may not be palpable.  There is no significant erythema and no unexpected tenderness.     EXAM: CHEST - 2 VIEW  COMPARISON:  06/30/2020  FINDINGS: Heart size is normal. Previous median sternotomy and CABG. Atrial appendage clips. The right lung is clear. Mild residual atelectasis at the left lung base, somewhat improved since the comparison study. No acute bone  finding.  IMPRESSION: Mild residual atelectasis at the left lung base. Improved since the comparison study.   Electronically Signed   By: Paulina Fusi M.D.   On: 07/18/2020 13:43   Impression / Plan:  Left forearm swelling and a 44 year old male 3 weeks status post left radial artery harvest for CABG x2.  Patient reports he expressed the watery fluid from the incision earlier this morning and noted some relief of the swelling.  He is afebrile and having no unexpected discomfort in the forearm.  Will order ulltrasound of the forearm today to rule out fluid collection.  We will also do a left upper extremity ultrasound to rule out subclavian DVT although this is unlikely since he has been on Xarelto for his paroxysmal atrial fibrillation.  I asked him to return for follow-up later this week.  I asked him is to keep the wound clean and dry.  Keeping the arm elevated when he is in bed may help limit swelling.  Leary Roca, PA-C [facility] [phone]

## 2020-07-18 NOTE — Patient Instructions (Signed)
Keep incisions clean and dry.  Elevate the left arm when in bed and as often as possible during the day.  Ultrasound left fore arm and left subclavian vein.

## 2020-07-19 ENCOUNTER — Telehealth: Payer: Self-pay

## 2020-07-19 ENCOUNTER — Ambulatory Visit (HOSPITAL_COMMUNITY)
Admission: RE | Admit: 2020-07-19 | Discharge: 2020-07-19 | Disposition: A | Discharge status: Home / Self Care | Payer: BC Managed Care – PPO | Source: Ambulatory Visit | Attending: Physician Assistant | Admitting: Physician Assistant

## 2020-07-19 DIAGNOSIS — Z951 Presence of aortocoronary bypass graft: Secondary | ICD-10-CM

## 2020-07-19 DIAGNOSIS — I82622 Acute embolism and thrombosis of deep veins of left upper extremity: Secondary | ICD-10-CM

## 2020-07-19 NOTE — Telephone Encounter (Signed)
Patient contacted the office with concerns about his left radial artery harvest site.  He is s/p CABG x2, clipping of atrial appendage 06/27/20 with Dr. Tyrone Sage.  Patient stated over the phone that his arm has been swollen since discharge from the hospital and he has not had relief. He stated that this morning he noticed an area of the incision, where drainage had been coming from started to dehisce.  Stated that "reddish-orange" drainage came out of the incision site and the swelling subsided some.  Also, he states that he had reached with his arm a couple days ago and felt a pop on the right side of his chest, and since it has been burning, mostly at night.  He stated that he did not have a temperature and the drainage did not smell.  Stated that the sutures were already removed in the office.  Advised patient that he could come into the office today and see Jillyn Hidden, PA for evaluation with a chest xray.  He acknowledged receipt.

## 2020-07-20 ENCOUNTER — Telehealth: Payer: Self-pay | Admitting: *Deleted

## 2020-07-20 NOTE — Telephone Encounter (Signed)
Spoke with Joseph Lang per Dr. Tyrone Sage regarding vascular ultrasound of left upper extremity. Told pt his study came back normal with no evidence of DVT. Pt has appt tomorrow with Dr. Tyrone Sage for follow up. All pt questions answered at this time.

## 2020-07-21 ENCOUNTER — Other Ambulatory Visit: Payer: Self-pay

## 2020-07-21 ENCOUNTER — Ambulatory Visit (INDEPENDENT_AMBULATORY_CARE_PROVIDER_SITE_OTHER): Payer: Self-pay | Admitting: Cardiothoracic Surgery

## 2020-07-21 ENCOUNTER — Encounter: Payer: Self-pay | Admitting: Cardiothoracic Surgery

## 2020-07-21 VITALS — BP 131/82 | HR 55 | Temp 97.5°F | Resp 18 | Wt 390.4 lb

## 2020-07-21 DIAGNOSIS — Z951 Presence of aortocoronary bypass graft: Secondary | ICD-10-CM

## 2020-07-21 NOTE — Progress Notes (Signed)
[address]       [place] [address]             [phone]      [place] Kruschke [facility] Medical Record #[id number] Date of Birth: 1976/05/27  Referring: Yates Decamp, MD Primary Care: Georgann Housekeeper, MD Primary Cardiologist: No primary care provider on file.   Chief Complaint:   POST OP FOLLOW UP OPERATIVE REPORT DATE OF PROCEDURE:  06/27/2020 PREOPERATIVE DIAGNOSES:  Severe coronary artery disease with total occlusion of the left anterior descending and high-grade stenosis of diagonal.  Paroxysmal atrial fibrillation. POSTOPERATIVE DIAGNOSIS:  Severe coronary artery disease with total occlusion of the left anterior descending and high-grade stenosis of diagonal.  Paroxysmal atrial fibrillation. SURGICAL PROCEDURE: 1.  Coronary artery bypass grafting x2 with the left internal mammary artery to the left anterior descending coronary artery, left radial artery to the diagonal coronary artery with left radial artery harvest 2.Placement of AtriCure clip on  Left atrium, 40 mm.  History of Present Illness:     Patient returns office today in follow-up after coronary artery bypass grafting in September 13.  The patient has had numerous complaints since discharge including spasms in muscles in his back, sloshing of fluid and popping of his right shoulder and swelling of his left forearm, site of radial artery harvest.  His left hand is neurovascularly intact ultrasound of the left forearm last week did not show any significant pockets of fluid.  In the office today we did with the 22-gauge needle attempt to aspirate the upper portion of the left forearm incision but without any return of fluid.      Past Medical History:  Diagnosis Date  . Coronary artery disease 2021  . Dysrhythmia 2021   Afib  . GERD (gastroesophageal reflux disease)   . Hx of deep vein thrombophlebitis of lower extremity   . Hx of gastric bypass   . Hx of pulmonary embolus   . Hypertension    . Obesity   . PONV (postoperative nausea and vomiting)    Throwing up after wt loss surgery  . S/P CABG x 2 06/28/2020: LIMA to LAD, Free Radial graft to D1. LAA ligation with 40 mm Atricure. 06/27/2020  . Sleep apnea   . Tobacco abuse      Social History   Tobacco Use  Smoking Status Former Smoker  . Packs/day: 0.50  . Years: 28.00  . Pack years: 14.00  . Types: Cigarettes  . Quit date: 04/20/2020  . Years since quitting: 0.2  Smokeless Tobacco Never Used  Tobacco Comment   half a pack daily    Social History   Substance and Sexual Activity  Alcohol Use Not Currently     Allergies  Allergen Reactions  . Amoxicillin Nausea And Vomiting    Current Outpatient Medications  Medication Sig Dispense Refill  . amiodarone (PACERONE) 200 MG tablet Take 0.5 tablets (100 mg total) by mouth daily. Take 200 mg bid for one week;then take 200 mg daily thereafter 60 tablet 0  . aspirin EC 81 MG tablet Take 1 tablet (81 mg total) by mouth daily. 90 tablet 3  . atorvastatin (LIPITOR) 80 MG tablet Take 1 tablet (80 mg total) by mouth daily. 30 tablet 2  . baclofen (LIORESAL) 20 MG tablet Take 1 tablet (20 mg total) by mouth 2 (two) times daily as needed for muscle spasms. 30 each 0  . isosorbide mononitrate (IMDUR) 30 MG 24 hr  tablet Take 0.5 tablets (15 mg total) by mouth daily. Please take until finished 25 tablet 0  . metoprolol tartrate (LOPRESSOR) 100 MG tablet Take 1 tablet (100 mg total) by mouth 2 (two) times daily. 180 tablet 3  . oxyCODONE (OXY IR/ROXICODONE) 5 MG immediate release tablet Take 1 tablet (5 mg total) by mouth every 4 (four) hours as needed for severe pain. 30 tablet 0  . RABEprazole (ACIPHEX) 20 MG tablet Take 20 mg by mouth daily.     Carlena Hurl 20 MG TABS tablet Take 20 mg by mouth daily.     No current facility-administered medications for this visit.       Physical Exam: BP 131/82 (BP Location: Right Arm, Patient Position: Sitting, Cuff Size: Large)  Comment (BP Location): forearm  Pulse (!) 55   Temp (!) 97.5 F (36.4 C) (Skin)   Resp 18   Wt (!) 390 lb 6.4 oz (177.1 kg)   SpO2 95% Comment: RA  BMI 50.12 kg/m   General appearance: alert, cooperative and no distress Neurologic: intact Heart: regular rate and rhythm, S1, S2 normal, no murmur, click, rub or gallop Lungs: diminished breath sounds bibasilar Abdomen: soft, non-tender; bowel sounds normal; no masses,  no organomegaly Extremities: Patient has some swelling in the left forearm compared to the right, the left radial harvest site is healed without evidence of infection, currently there is no drainage, as noted aspiration of the palpable area did not return any fluid Wound: Sternum is stable without popping or clicking with cough or movement   Diagnostic Studies & Laboratory data:     Recent Radiology Findings:  CLINICAL DATA:  Follow-up CABG  EXAM: CHEST - 2 VIEW  COMPARISON:  06/30/2020  FINDINGS: Heart size is normal. Previous median sternotomy and CABG. Atrial appendage clips. The right lung is clear. Mild residual atelectasis at the left lung base, somewhat improved since the comparison study. No acute bone finding.  IMPRESSION: Mild residual atelectasis at the left lung base. Improved since the comparison study.   Electronically Signed   By: Paulina Fusi M.D.   On: 07/18/2020 13:43    VAS [place] UPPER EXTREMITY VENOUS DUPLEX  Result Date: 07/19/2020 UPPER VENOUS STUDY  Other Indications: Left forearm swelling since CABG. Risk Factors: Surgery CABG with left radial artery harves on 06/27/20. Performing Technologist: Thereasa Parkin RVT  Examination Guidelines: A complete evaluation includes B-mode imaging, spectral Doppler, color Doppler, and power Doppler as needed of all accessible portions of each vessel. Bilateral testing is considered an integral part of a complete examination. Limited examinations for reoccurring indications may be performed as  noted.  Left Findings: +----------+------------+---------+-----------+----------+-------+ LEFT      CompressiblePhasicitySpontaneousPropertiesSummary +----------+------------+---------+-----------+----------+-------+ IJV                      No        Yes                      +----------+------------+---------+-----------+----------+-------+ Subclavian               No        Yes                      +----------+------------+---------+-----------+----------+-------+ Axillary      Full       No        Yes                      +----------+------------+---------+-----------+----------+-------+  Brachial      Full       Yes       Yes                      +----------+------------+---------+-----------+----------+-------+ Cephalic      Full       No        Yes                      +----------+------------+---------+-----------+----------+-------+ Basilic       Full       Yes       Yes                      +----------+------------+---------+-----------+----------+-------+  Summary:  Left: No evidence of DVT or superficial thrombosis. Venous waveforms are continuous. Radial artery harvest site is hypoechoic with no internal echoes.  *See table(s) above for measurements and observations.  Diagnosing physician: Sherald Hess MD Electronically signed by Sherald Hess MD on 07/19/2020 at 5:30:46 PM.    Final       Recent Lab Findings: Lab Results  Component Value Date   WBC 13.7 (H) 06/30/2020   HGB 12.9 (L) 06/30/2020   HCT 40.0 06/30/2020   PLT 239 06/30/2020   GLUCOSE 108 (H) 07/02/2020   ALT 33 06/24/2020   AST 23 06/24/2020   NA 140 07/02/2020   K 3.7 07/02/2020   CL 105 07/02/2020   CREATININE 0.81 07/02/2020   BUN 9 07/02/2020   CO2 26 07/02/2020   TSH 1.564 01/17/2020   INR 2.0 (H) 07/01/2020   HGBA1C 5.5 06/23/2020      Assessment / Plan:   #1 status post recent coronary artery bypass grafting with left radial harvest, patient has some  complaints of swelling of the left forearm related to the harvest, the hand is neurovascularly intact-patient notes that he is now off pain medication, he has been referred to cardiac rehab.   We did instruct him demonstrate lightly wrapped Ace wrap starting at the hand up above his elbow to help with the swelling in addition to elevation of his arm  We will see him back in 4 to 5 weeks with a follow-up chest x-ray  He was allowed to return to driving when the swelling in his left arm is decreased and is no longer using pain medication  Medication Changes: No orders of the defined types were placed in this encounter.     Delight Ovens MD      [address] [place] [place] [address] Office [phone]     07/21/2020 9:43 AM

## 2020-07-22 ENCOUNTER — Telehealth (HOSPITAL_COMMUNITY): Payer: Self-pay

## 2020-07-22 NOTE — Telephone Encounter (Signed)
Pt called to get scheduled for CR. Patient will come in for orientation on 08/02/20 @ 830AM and will attend the 11AM exercise class.  Mailed letter

## 2020-07-23 NOTE — Telephone Encounter (Signed)
From cardiology okay to start cardia rehab, await surgical clearance

## 2020-07-28 ENCOUNTER — Ambulatory Visit: Payer: BC Managed Care – PPO | Admitting: Cardiothoracic Surgery

## 2020-07-28 ENCOUNTER — Telehealth (HOSPITAL_COMMUNITY): Payer: Self-pay

## 2020-07-28 NOTE — Telephone Encounter (Signed)
Cardiac Rehab Medication Review by a Pharmacist  Does the patient  feel that his/her medications are working for him/her?  yes  Has the patient been experiencing any side effects to the medications prescribed?  yes  Does the patient measure his/her own blood pressure or blood glucose at home?  no   Does the patient have any problems obtaining medications due to transportation or finances?   no  Understanding of regimen: excellent Understanding of indications: excellent Potential of compliance: excellent   Pharmacist Intervention: n/a   Joseph Lang, PharmD PGY-1 Acute Care Pharmacy Resident Office: [phone] 07/28/2020 4:54 PM

## 2020-08-01 ENCOUNTER — Telehealth (HOSPITAL_COMMUNITY): Payer: Self-pay | Admitting: *Deleted

## 2020-08-01 NOTE — Telephone Encounter (Addendum)
Spoke with the patient. Completed health history. Confirmed orientation appointment for tomorrow. Gladstone Lighter, RN,BSN 08/01/2020 4:57 PM

## 2020-08-02 ENCOUNTER — Encounter (HOSPITAL_COMMUNITY): Payer: Self-pay

## 2020-08-02 ENCOUNTER — Encounter (HOSPITAL_COMMUNITY)
Admission: RE | Admit: 2020-08-02 | Discharge: 2020-08-02 | Disposition: A | Discharge status: Home / Self Care | Payer: BC Managed Care – PPO | Source: Ambulatory Visit | Attending: Cardiology | Admitting: Cardiology

## 2020-08-02 ENCOUNTER — Other Ambulatory Visit: Payer: Self-pay

## 2020-08-02 ENCOUNTER — Other Ambulatory Visit: Payer: Self-pay | Admitting: Cardiology

## 2020-08-02 VITALS — BP 110/72 | Ht 72.75 in | Wt 399.7 lb

## 2020-08-02 DIAGNOSIS — E7800 Pure hypercholesterolemia, unspecified: Secondary | ICD-10-CM

## 2020-08-02 DIAGNOSIS — I25118 Atherosclerotic heart disease of native coronary artery with other forms of angina pectoris: Secondary | ICD-10-CM

## 2020-08-02 DIAGNOSIS — Z951 Presence of aortocoronary bypass graft: Secondary | ICD-10-CM

## 2020-08-02 NOTE — Progress Notes (Signed)
Patient presents here for cardiac rehab orientation and walk test and orientation. Mr Vicente Males continues to voice concerns about his left radial arm incision. Upon inspection left arm is large and soft upon palpation. Incision is healing. There is a small pin point area with a scabbed yellow area. A  small scabbed area is also present toward the middle of Mr Levin's mid sternal incision. Patient's weight is 181.3 kg which is up 4.2 kg on his last office visit on 07/21/20. Temperature 96.8. Blood pressure 110/72. Oxygen saturation 95% on room air. Lung fields clear upon ascultation. Patient denies shortness of breath. Morrie Sheldon at Dr Dennie Maizes office called and notified about weight gain and assessment of arm. Morrie Sheldon talked with the patient over the phone. Will continue to monitor the patient throughout  the program.Marcina Kinnison Harlon Flor, RN,BSN 08/02/2020 12:02 PM

## 2020-08-02 NOTE — Progress Notes (Signed)
Cardiac Individual Treatment Plan  Patient Details  Name: Joseph Lang MRN: [id number] Date of Birth: 10/05/1976 Referring Provider:     CARDIAC REHAB PHASE II ORIENTATION from 08/02/2020 in [name] CARDIAC REHAB  Referring Provider Yates Decamp, MD      Initial Encounter Date:    CARDIAC REHAB PHASE II ORIENTATION from 08/02/2020 in [facility] CARDIAC REHAB  Date 08/02/20      Visit Diagnosis: S/P CABG x 2 06/27/20  Patient's Home Medications on Admission:  Current Outpatient Medications:  .  amiodarone (PACERONE) 200 MG tablet, Take 0.5 tablets (100 mg total) by mouth daily. Take 200 mg bid for one week;then take 200 mg daily thereafter, Disp: 60 tablet, Rfl: 0 .  aspirin EC 81 MG tablet, Take 1 tablet (81 mg total) by mouth daily., Disp: 90 tablet, Rfl: 3 .  atorvastatin (LIPITOR) 80 MG tablet, Take 1 tablet (80 mg total) by mouth daily., Disp: 30 tablet, Rfl: 2 .  baclofen (LIORESAL) 20 MG tablet, Take 1 tablet (20 mg total) by mouth 2 (two) times daily as needed for muscle spasms., Disp: 30 each, Rfl: 0 .  isosorbide mononitrate (IMDUR) 30 MG 24 hr tablet, Take 0.5 tablets (15 mg total) by mouth daily. Please take until finished, Disp: 25 tablet, Rfl: 0 .  metoprolol tartrate (LOPRESSOR) 100 MG tablet, Take 1 tablet (100 mg total) by mouth 2 (two) times daily., Disp: 180 tablet, Rfl: 3 .  oxyCODONE (OXY IR/ROXICODONE) 5 MG immediate release tablet, Take 1 tablet (5 mg total) by mouth every 4 (four) hours as needed for severe pain. (Patient not taking: Reported on 07/28/2020), Disp: 30 tablet, Rfl: 0 .  RABEprazole (ACIPHEX) 20 MG tablet, Take 20 mg by mouth daily. , Disp: , Rfl:  .  XARELTO 20 MG TABS tablet, Take 20 mg by mouth daily., Disp: , Rfl:   Past Medical History: Past Medical History:  Diagnosis Date  . Coronary artery disease 2021  . Dysrhythmia 2021   Afib  . GERD (gastroesophageal reflux disease)   . Hx of deep vein  thrombophlebitis of lower extremity   . Hx of gastric bypass   . Hx of pulmonary embolus   . Hypertension   . Obesity   . PONV (postoperative nausea and vomiting)    Throwing up after wt loss surgery  . S/P CABG x 2 06/28/2020: LIMA to LAD, Free Radial graft to D1. LAA ligation with 40 mm Atricure. 06/27/2020  . Sleep apnea   . Tobacco abuse     Tobacco Use: Social History   Tobacco Use  Smoking Status Former Smoker  . Packs/day: 0.50  . Years: 28.00  . Pack years: 14.00  . Types: Cigarettes  . Quit date: 04/20/2020  . Years since quitting: 0.2  Smokeless Tobacco Never Used  Tobacco Comment   half a pack daily    Labs: Recent Review Flowsheet Data    Labs for ITP Cardiac and Pulmonary Rehab Latest Ref Rng & Units 06/27/2020 06/27/2020 06/27/2020 06/27/2020 06/27/2020   Hemoglobin A1c 4.8 - 5.6 % - - - - -   PHART 7.35 - 7.45 - - 7.370 7.375 7.362   PCO2ART 32 - 48 mmHg - - 47.4 41.6 40.8   HCO3 20.0 - 28.0 mmol/L - 25.2 27.7 24.4 23.2   TCO2 22 - 32 mmol/L 25 27 29 26 24    ACIDBASEDEF 0.0 - 2.0 mmol/L - 3.0(H) - 1.0 2.0   O2SAT % -  85.0 94.0 96.0 95.0      Capillary Blood Glucose: Lab Results  Component Value Date   GLUCAP 155 (H) 06/29/2020   GLUCAP 97 06/29/2020   GLUCAP 108 (H) 06/29/2020   GLUCAP 110 (H) 06/28/2020   GLUCAP 123 (H) 06/28/2020     Exercise Target Goals: Exercise Program Goal: Individual exercise prescription set using results from initial 6 min walk test and THRR while considering  patient's activity barriers and safety.   Exercise Prescription Goal: Starting with aerobic activity 30 plus minutes a day, 3 days per week for initial exercise prescription. Provide home exercise prescription and guidelines that participant acknowledges understanding prior to discharge.  Activity Barriers & Risk Stratification:  Activity Barriers & Cardiac Risk Stratification - 08/02/20 1140      Activity Barriers & Cardiac Risk Stratification   Activity Barriers  Deconditioning    Cardiac Risk Stratification High           6 Minute Walk:  6 Minute Walk    Row Name 08/02/20 0851         6 Minute Walk   Phase Initial     Distance 1184 feet     Walk Time 6 minutes     # of Rest Breaks 0     MPH 2.24     METS 2.27     RPE 11     Perceived Dyspnea  1     VO2 Peak 7.96     Symptoms No     Resting HR 73 bpm     Resting BP 110/72     Resting Oxygen Saturation  97 %     Exercise Oxygen Saturation  during 6 min walk 97 %     Max Ex. HR 93 bpm     Max Ex. BP 130/78     2 Minute Post BP 114/72            Oxygen Initial Assessment:   Oxygen Re-Evaluation:   Oxygen Discharge (Final Oxygen Re-Evaluation):   Initial Exercise Prescription:  Initial Exercise Prescription - 08/02/20 1100      Date of Initial Exercise RX and Referring Provider   Date 08/02/20    Referring Provider Yates Decamp, MD    Expected Discharge Date 09/30/20      NuStep   Level 2    SPM 85    Minutes 30    METs 2      Prescription Details   Frequency (times per week) 3    Duration Progress to 30 minutes of continuous aerobic without signs/symptoms of physical distress      Intensity   THRR 40-80% of Max Heartrate 70-139    Ratings of Perceived Exertion 11-13    Perceived Dyspnea 0-4      Progression   Progression Continue progressive overload as per policy without signs/symptoms or physical distress.      Resistance Training   Training Prescription Yes    Weight 4 lbs    Reps 10-15           Perform Capillary Blood Glucose checks as needed.  Exercise Prescription Changes:   Exercise Comments:   Exercise Goals and Review:   Exercise Goals    Row Name 08/02/20 1140             Exercise Goals   Increase Physical Activity Yes       Intervention Provide advice, education, support and counseling about physical activity/exercise needs.;Develop an individualized exercise prescription for  aerobic and resistive training based on  initial evaluation findings, risk stratification, comorbidities and participant's personal goals.       Expected Outcomes Short Term: Attend rehab on a regular basis to increase amount of physical activity.;Long Term: Add in home exercise to make exercise part of routine and to increase amount of physical activity.;Long Term: Exercising regularly at least 3-5 days a week.       Increase Strength and Stamina Yes       Intervention Provide advice, education, support and counseling about physical activity/exercise needs.;Develop an individualized exercise prescription for aerobic and resistive training based on initial evaluation findings, risk stratification, comorbidities and participant's personal goals.       Expected Outcomes Short Term: Increase workloads from initial exercise prescription for resistance, speed, and METs.;Short Term: Perform resistance training exercises routinely during rehab and add in resistance training at home;Long Term: Improve cardiorespiratory fitness, muscular endurance and strength as measured by increased METs and functional capacity ( )       Able to understand and use rate of perceived exertion (RPE) scale Yes       Intervention Provide education and explanation on how to use RPE scale       Expected Outcomes Short Term: Able to use RPE daily in rehab to express subjective intensity level;Long Term:  Able to use RPE to guide intensity level when exercising independently       Knowledge and understanding of Target Heart Rate Range (THRR) Yes       Intervention Provide education and explanation of THRR including how the numbers were predicted and where they are located for reference       Expected Outcomes Short Term: Able to state/look up THRR;Short Term: Able to use daily as guideline for intensity in rehab;Long Term: Able to use THRR to govern intensity when exercising independently       Able to check pulse independently Yes       Intervention Provide education and  demonstration on how to check pulse in carotid and radial arteries.;Review the importance of being able to check your own pulse for safety during independent exercise       Expected Outcomes Short Term: Able to explain why pulse checking is important during independent exercise;Long Term: Able to check pulse independently and accurately       Understanding of Exercise Prescription Yes       Intervention Provide education, explanation, and written materials on patient's individual exercise prescription       Expected Outcomes Short Term: Able to explain program exercise prescription;Long Term: Able to explain home exercise prescription to exercise independently              Exercise Goals Re-Evaluation :    Discharge Exercise Prescription (Final Exercise Prescription Changes):   Nutrition:  Target Goals: Understanding of nutrition guidelines, daily intake of sodium 1500mg , cholesterol 200mg , calories 30% from fat and 7% or less from saturated fats, daily to have 5 or more servings of fruits and vegetables.  Biometrics:  Pre Biometrics - 08/02/20 0830      Pre Biometrics   Waist Circumference 63 inches    Hip Circumference 60 inches    Waist to Hip Ratio 1.05 %    Triceps Skinfold 48 mm    % Body Fat 51.5 %    Grip Strength 50 kg    Flexibility --   Not done, pt voices cannot do   Single Leg Stand 19.18 seconds  Nutrition Therapy Plan and Nutrition Goals:   Nutrition Assessments:   Nutrition Goals Re-Evaluation:   Nutrition Goals Discharge (Final Nutrition Goals Re-Evaluation):   Psychosocial: Target Goals: Acknowledge presence or absence of significant depression and/or stress, maximize coping skills, provide positive support system. Participant is able to verbalize types and ability to use techniques and skills needed for reducing stress and depression.  Initial Review & Psychosocial Screening:  Initial Psych Review & Screening - 08/02/20 1207       Initial Review   Current issues with Current Stress Concerns    Source of Stress Concerns Chronic Illness    Comments Patient concerned about his recovery post surgery      Family Dynamics   Good Support System? Yes   Joseph Lang has his Mom for support who lives with him     Barriers   Psychosocial barriers to participate in program The patient should benefit from training in stress management and relaxation.      Screening Interventions   Interventions Encouraged to exercise    Expected Outcomes Long Term Goal: Stressors or current issues are controlled or eliminated.           Quality of Life Scores:  Quality of Life - 08/02/20 1128      Quality of Life   Select Quality of Life      Quality of Life Scores   Health/Function Pre 22.8 %    Socioeconomic Pre 25.5 %    Psych/Spiritual Pre 24.43 %    Family Pre 24 %    GLOBAL Pre 23.86 %          Scores of 19 and below usually indicate a poorer quality of life in these areas.  A difference of  2-3 points is a clinically meaningful difference.  A difference of 2-3 points in the total score of the Quality of Life Index has been associated with significant improvement in overall quality of life, self-image, physical symptoms, and general health in studies assessing change in quality of life.  PHQ-9: Recent Review Flowsheet Data    Depression screen [facility] 08/02/2020 12/29/2014   Decreased Interest 0 0   Down, Depressed, Hopeless 0 0   PHQ - 2 Score 0 0     Interpretation of Total Score  Total Score Depression Severity:  1-4 = Minimal depression, 5-9 = Mild depression, 10-14 = Moderate depression, 15-19 = Moderately severe depression, 20-27 = Severe depression   Psychosocial Evaluation and Intervention:   Psychosocial Re-Evaluation:   Psychosocial Discharge (Final Psychosocial Re-Evaluation):   Vocational Rehabilitation: Provide vocational rehab assistance to qualifying candidates.   Vocational Rehab Evaluation &  Intervention:  Vocational Rehab - 08/02/20 1210      Initial Vocational Rehab Evaluation & Intervention   Assessment shows need for Vocational Rehabilitation No           Education: Education Goals: Education classes will be provided on a weekly basis, covering required topics. Participant will state understanding/return demonstration of topics presented.  Learning Barriers/Preferences:  Learning Barriers/Preferences - 08/02/20 1130      Learning Barriers/Preferences   Learning Barriers Sight;Hearing   Wears glasses and hearing aids   Learning Preferences Pictoral;Skilled Demonstration           Education Topics: Hypertension, Hypertension Reduction -Define heart disease and high blood pressure. Discus how high blood pressure affects the body and ways to reduce high blood pressure.   Exercise and Your Heart -Discuss why it is important to exercise, the [facility]  principles of exercise, normal and abnormal responses to exercise, and how to exercise safely.   Angina -Discuss definition of angina, causes of angina, treatment of angina, and how to decrease risk of having angina.   Cardiac Medications -Review what the following cardiac medications are used for, how they affect the body, and side effects that may occur when taking the medications.  Medications include Aspirin, Beta blockers, calcium channel blockers, ACE Inhibitors, angiotensin receptor blockers, diuretics, digoxin, and antihyperlipidemics.   Congestive Heart Failure -Discuss the definition of CHF, how to live with CHF, the signs and symptoms of CHF, and how keep track of weight and sodium intake.   Heart Disease and Intimacy -Discus the effect sexual activity has on the heart, how changes occur during intimacy as we age, and safety during sexual activity.   Smoking Cessation / COPD -Discuss different methods to quit smoking, the health benefits of quitting smoking, and the definition of COPD.   Nutrition I:  Fats -Discuss the types of cholesterol, what cholesterol does to the heart, and how cholesterol levels can be controlled.   Nutrition II: Labels -Discuss the different components of food labels and how to read food label   Heart Parts/Heart Disease and PAD -Discuss the anatomy of the heart, the pathway of blood circulation through the heart, and these are affected by heart disease.   Stress I: Signs and Symptoms -Discuss the causes of stress, how stress may lead to anxiety and depression, and ways to limit stress.   Stress II: Relaxation -Discuss different types of relaxation techniques to limit stress.   Warning Signs of Stroke / TIA -Discuss definition of a stroke, what the signs and symptoms are of a stroke, and how to identify when someone is having stroke.   Knowledge Questionnaire Score:  Knowledge Questionnaire Score - 08/02/20 1130      Knowledge Questionnaire Score   Pre Score 23/24           Core Components/Risk Factors/Patient Goals at Admission:  Personal Goals and Risk Factors at Admission - 08/02/20 1220      Core Components/Risk Factors/Patient Goals on Admission    Weight Management Yes;Obesity;Weight Loss    Intervention Weight Management: Develop a combined nutrition and exercise program designed to reach desired caloric intake, while maintaining appropriate intake of nutrient and fiber, sodium and fats, and appropriate energy expenditure required for the weight goal.;Weight Management: Provide education and appropriate resources to help participant work on and attain dietary goals.;Weight Management/Obesity: Establish reasonable short term and long term weight goals.;Obesity: Provide education and appropriate resources to help participant work on and attain dietary goals.    Admit Weight 399 lb 11.1 oz (181.3 kg)    Expected Outcomes Short Term: Continue to assess and modify interventions until short term weight is achieved;Long Term: Adherence to nutrition  and physical activity/exercise program aimed toward attainment of established weight goal;Weight Maintenance: Understanding of the daily nutrition guidelines, which includes 25-35% calories from fat, 7% or less cal from saturated fats, less than  cholesterol, less than 1.5gm of sodium, & 5 or more servings of fruits and vegetables daily;Weight Loss: Understanding of general recommendations for a balanced deficit meal plan, which promotes 1-2 lb weight loss per week and includes a negative energy balance of [phone] kcal/d;Understanding recommendations for meals to include 15-35% energy as protein, 25-35% energy from fat, 35-60% energy from carbohydrates, less than  of dietary cholesterol, 20-35 gm of total fiber daily;Understanding of distribution of calorie intake throughout the day  with the consumption of 4-5 meals/snacks    Hypertension Yes    Intervention Provide education on lifestyle modifcations including regular physical activity/exercise, weight management, moderate sodium restriction and increased consumption of fresh fruit, vegetables, and low fat dairy, alcohol moderation, and smoking cessation.;Monitor prescription use compliance.    Expected Outcomes Short Term: Continued assessment and intervention until BP is < 140/33mm HG in hypertensive participants. < 130/38mm HG in hypertensive participants with diabetes, heart failure or chronic kidney disease.;Long Term: Maintenance of blood pressure at goal levels.    Lipids Yes    Intervention Provide education and support for participant on nutrition & aerobic/resistive exercise along with prescribed medications to achieve LDL 70mg , HDL >40mg .    Expected Outcomes Short Term: Participant states understanding of desired cholesterol values and is compliant with medications prescribed. Participant is following exercise prescription and nutrition guidelines.;Long Term: Cholesterol controlled with medications as prescribed, with individualized  exercise RX and with personalized nutrition plan. Value goals: LDL < 70mg , HDL > 40 mg.    Stress Yes    Intervention Offer individual and/or small group education and counseling on adjustment to heart disease, stress management and health-related lifestyle change. Teach and support self-help strategies.;Refer participants experiencing significant psychosocial distress to appropriate mental health specialists for further evaluation and treatment. When possible, include family members and significant others in education/counseling sessions.    Expected Outcomes Short Term: Participant demonstrates changes in health-related behavior, relaxation and other stress management skills, ability to obtain effective social support, and compliance with psychotropic medications if prescribed.;Long Term: Emotional wellbeing is indicated by absence of clinically significant psychosocial distress or social isolation.           Core Components/Risk Factors/Patient Goals Review:    Core Components/Risk Factors/Patient Goals at Discharge (Final Review):    ITP Comments:  ITP Comments    Row Name 08/02/20 0842           ITP Comments Dr 08/04/20 MD, Medical Director              Comments:Seymore attended orientation on 08/02/2020 to review rules and guidelines for program.  Completed 6 minute walk test, Intitial ITP, and exercise prescription.  VSS. Telemetry-Sinus Rhythm .  Asymptomatic. Safety measures and social distancing in place per CDC guidelines.Kamdyn's weight was up 4.2 kg from his last office visit. See previous note.08/04/2020, RN,BSN 08/02/2020 12:21 PM

## 2020-08-08 ENCOUNTER — Encounter (HOSPITAL_COMMUNITY): Payer: 59

## 2020-08-10 ENCOUNTER — Encounter (HOSPITAL_COMMUNITY): Payer: 59

## 2020-08-10 ENCOUNTER — Telehealth (HOSPITAL_COMMUNITY): Payer: Self-pay | Admitting: *Deleted

## 2020-08-10 NOTE — Telephone Encounter (Signed)
Spoke with [place]. [place] says he had had some things come up. [place] says he plans to begin exercise on Friday.Gladstone Lighter, RN,BSN 08/10/2020 1:43 PM

## 2020-08-11 ENCOUNTER — Encounter (HOSPITAL_COMMUNITY): Payer: Self-pay | Admitting: *Deleted

## 2020-08-11 DIAGNOSIS — Z951 Presence of aortocoronary bypass graft: Secondary | ICD-10-CM

## 2020-08-11 NOTE — Progress Notes (Signed)
Cardiac Individual Treatment Plan  Patient Details  Name: Joseph Lang MRN: [id number] Date of Birth: November 02, 1975 Referring Provider:     CARDIAC REHAB PHASE II ORIENTATION from 08/02/2020 in [name] CARDIAC REHAB  Referring Provider Yates Decamp, MD      Initial Encounter Date:    CARDIAC REHAB PHASE II ORIENTATION from 08/02/2020 in [facility] CARDIAC REHAB  Date 08/02/20      Visit Diagnosis: S/P CABG x 2 06/27/20  Patient's Home Medications on Admission:  Current Outpatient Medications:  .  amiodarone (PACERONE) 200 MG tablet, Take 0.5 tablets (100 mg total) by mouth daily. Take 200 mg bid for one week;then take 200 mg daily thereafter, Disp: 60 tablet, Rfl: 0 .  aspirin EC 81 MG tablet, Take 1 tablet (81 mg total) by mouth daily., Disp: 90 tablet, Rfl: 3 .  atorvastatin (LIPITOR) 80 MG tablet, TAKE 1 TABLET BY MOUTH DAILY, Disp: 30 tablet, Rfl: 1 .  baclofen (LIORESAL) 20 MG tablet, Take 1 tablet (20 mg total) by mouth 2 (two) times daily as needed for muscle spasms., Disp: 30 each, Rfl: 0 .  isosorbide mononitrate (IMDUR) 30 MG 24 hr tablet, Take 0.5 tablets (15 mg total) by mouth daily. Please take until finished, Disp: 25 tablet, Rfl: 0 .  metoprolol tartrate (LOPRESSOR) 100 MG tablet, Take 1 tablet (100 mg total) by mouth 2 (two) times daily., Disp: 180 tablet, Rfl: 3 .  oxyCODONE (OXY IR/ROXICODONE) 5 MG immediate release tablet, Take 1 tablet (5 mg total) by mouth every 4 (four) hours as needed for severe pain. (Patient not taking: Reported on 07/28/2020), Disp: 30 tablet, Rfl: 0 .  RABEprazole (ACIPHEX) 20 MG tablet, Take 20 mg by mouth daily. , Disp: , Rfl:  .  XARELTO 20 MG TABS tablet, Take 20 mg by mouth daily., Disp: , Rfl:   Past Medical History: Past Medical History:  Diagnosis Date  . Coronary artery disease 2021  . Dysrhythmia 2021   Afib  . GERD (gastroesophageal reflux disease)   . Hx of deep vein thrombophlebitis of  lower extremity   . Hx of gastric bypass   . Hx of pulmonary embolus   . Hypertension   . Obesity   . PONV (postoperative nausea and vomiting)    Throwing up after wt loss surgery  . S/P CABG x 2 06/28/2020: LIMA to LAD, Free Radial graft to D1. LAA ligation with 40 mm Atricure. 06/27/2020  . Sleep apnea   . Tobacco abuse     Tobacco Use: Social History   Tobacco Use  Smoking Status Former Smoker  . Packs/day: 0.50  . Years: 28.00  . Pack years: 14.00  . Types: Cigarettes  . Quit date: 04/20/2020  . Years since quitting: 0.3  Smokeless Tobacco Never Used  Tobacco Comment   half a pack daily    Labs: Recent Review Flowsheet Data    Labs for ITP Cardiac and Pulmonary Rehab Latest Ref Rng & Units 06/27/2020 06/27/2020 06/27/2020 06/27/2020 06/27/2020   Hemoglobin A1c 4.8 - 5.6 % - - - - -   PHART 7.35 - 7.45 - - 7.370 7.375 7.362   PCO2ART 32 - 48 mmHg - - 47.4 41.6 40.8   HCO3 20.0 - 28.0 mmol/L - 25.2 27.7 24.4 23.2   TCO2 22 - 32 mmol/L ACIDBASEDEF 0.0 - 2.0 mmol/L - 3.0(H) - 1.0 2.0   O2SAT % - 85.0 94.0 96.0  95.0      Capillary Blood Glucose: Lab Results  Component Value Date   GLUCAP 155 (H) 06/29/2020   GLUCAP 97 06/29/2020   GLUCAP 108 (H) 06/29/2020   GLUCAP 110 (H) 06/28/2020   GLUCAP 123 (H) 06/28/2020     Exercise Target Goals: Exercise Program Goal: Individual exercise prescription set using results from initial 6 min walk test and THRR while considering  patient's activity barriers and safety.   Exercise Prescription Goal: Starting with aerobic activity 30 plus minutes a day, 3 days per week for initial exercise prescription. Provide home exercise prescription and guidelines that participant acknowledges understanding prior to discharge.  Activity Barriers & Risk Stratification:  Activity Barriers & Cardiac Risk Stratification - 08/02/20 1140      Activity Barriers & Cardiac Risk Stratification   Activity Barriers Deconditioning     Cardiac Risk Stratification High           6 Minute Walk:  6 Minute Walk    Row Name 08/02/20 0851         6 Minute Walk   Phase Initial     Distance 1184 feet     Walk Time 6 minutes     # of Rest Breaks 0     MPH 2.24     METS 2.27     RPE 11     Perceived Dyspnea  1     VO2 Peak 7.96     Symptoms No     Resting HR 73 bpm     Resting BP 110/72     Resting Oxygen Saturation  97 %     Exercise Oxygen Saturation  during 6 min walk 97 %     Max Ex. HR 93 bpm     Max Ex. BP 130/78     2 Minute Post BP 114/72            Oxygen Initial Assessment:   Oxygen Re-Evaluation:   Oxygen Discharge (Final Oxygen Re-Evaluation):   Initial Exercise Prescription:  Initial Exercise Prescription - 08/02/20 1100      Date of Initial Exercise RX and Referring Provider   Date 08/02/20    Referring Provider Yates Decamp, MD    Expected Discharge Date 09/30/20      NuStep   Level 2    SPM 85    Minutes 30    METs 2      Prescription Details   Frequency (times per week) 3    Duration Progress to 30 minutes of continuous aerobic without signs/symptoms of physical distress      Intensity   THRR 40-80% of Max Heartrate 70-139    Ratings of Perceived Exertion 11-13    Perceived Dyspnea 0-4      Progression   Progression Continue progressive overload as per policy without signs/symptoms or physical distress.      Resistance Training   Training Prescription Yes    Weight 4 lbs    Reps 10-15           Perform Capillary Blood Glucose checks as needed.  Exercise Prescription Changes:   Exercise Comments:   Exercise Goals and Review:   Exercise Goals    Row Name 08/02/20 1140             Exercise Goals   Increase Physical Activity Yes       Intervention Provide advice, education, support and counseling about physical activity/exercise needs.;Develop an individualized exercise prescription for aerobic and resistive  training based on initial evaluation  findings, risk stratification, comorbidities and participant's personal goals.       Expected Outcomes Short Term: Attend rehab on a regular basis to increase amount of physical activity.;Long Term: Add in home exercise to make exercise part of routine and to increase amount of physical activity.;Long Term: Exercising regularly at least 3-5 days a week.       Increase Strength and Stamina Yes       Intervention Provide advice, education, support and counseling about physical activity/exercise needs.;Develop an individualized exercise prescription for aerobic and resistive training based on initial evaluation findings, risk stratification, comorbidities and participant's personal goals.       Expected Outcomes Short Term: Increase workloads from initial exercise prescription for resistance, speed, and METs.;Short Term: Perform resistance training exercises routinely during rehab and add in resistance training at home;Long Term: Improve cardiorespiratory fitness, muscular endurance and strength as measured by increased METs and functional capacity (6MWT)       Able to understand and use rate of perceived exertion (RPE) scale Yes       Intervention Provide education and explanation on how to use RPE scale       Expected Outcomes Short Term: Able to use RPE daily in rehab to express subjective intensity level;Long Term:  Able to use RPE to guide intensity level when exercising independently       Knowledge and understanding of Target Heart Rate Range (THRR) Yes       Intervention Provide education and explanation of THRR including how the numbers were predicted and where they are located for reference       Expected Outcomes Short Term: Able to state/look up THRR;Short Term: Able to use daily as guideline for intensity in rehab;Long Term: Able to use THRR to govern intensity when exercising independently       Able to check pulse independently Yes       Intervention Provide education and demonstration on how  to check pulse in carotid and radial arteries.;Review the importance of being able to check your own pulse for safety during independent exercise       Expected Outcomes Short Term: Able to explain why pulse checking is important during independent exercise;Long Term: Able to check pulse independently and accurately       Understanding of Exercise Prescription Yes       Intervention Provide education, explanation, and written materials on patient's individual exercise prescription       Expected Outcomes Short Term: Able to explain program exercise prescription;Long Term: Able to explain home exercise prescription to exercise independently              Exercise Goals Re-Evaluation :    Discharge Exercise Prescription (Final Exercise Prescription Changes):   Nutrition:  Target Goals: Understanding of nutrition guidelines, daily intake of sodium 1500mg , cholesterol 200mg , calories 30% from fat and 7% or less from saturated fats, daily to have 5 or more servings of fruits and vegetables.  Biometrics:  Pre Biometrics - 08/02/20 0830      Pre Biometrics   Waist Circumference 63 inches    Hip Circumference 60 inches    Waist to Hip Ratio 1.05 %    Triceps Skinfold 48 mm    % Body Fat 51.5 %    Grip Strength 50 kg    Flexibility --   Not done, pt voices cannot do   Single Leg Stand 19.18 seconds  Nutrition Therapy Plan and Nutrition Goals:   Nutrition Assessments:   Nutrition Goals Re-Evaluation:   Nutrition Goals Discharge (Final Nutrition Goals Re-Evaluation):   Psychosocial: Target Goals: Acknowledge presence or absence of significant depression and/or stress, maximize coping skills, provide positive support system. Participant is able to verbalize types and ability to use techniques and skills needed for reducing stress and depression.  Initial Review & Psychosocial Screening:  Initial Psych Review & Screening - 08/02/20 1207      Initial Review    Current issues with Current Stress Concerns    Source of Stress Concerns Chronic Illness    Comments Patient concerned about his recovery post surgery      Family Dynamics   Good Support System? Yes   Joseph has his Mom for support who lives with him     Barriers   Psychosocial barriers to participate in program The patient should benefit from training in stress management and relaxation.      Screening Interventions   Interventions Encouraged to exercise    Expected Outcomes Long Term Goal: Stressors or current issues are controlled or eliminated.           Quality of Life Scores:  Quality of Life - 08/02/20 1128      Quality of Life   Select Quality of Life      Quality of Life Scores   Health/Function Pre 22.8 %    Socioeconomic Pre 25.5 %    Psych/Spiritual Pre 24.43 %    Family Pre 24 %    GLOBAL Pre 23.86 %          Scores of 19 and below usually indicate a poorer quality of life in these areas.  A difference of  2-3 points is a clinically meaningful difference.  A difference of 2-3 points in the total score of the Quality of Life Index has been associated with significant improvement in overall quality of life, self-image, physical symptoms, and general health in studies assessing change in quality of life.  PHQ-9: Recent Review Flowsheet Data    Depression screen [facility] 2/9 08/02/2020 12/29/2014   Decreased Interest 0 0   Down, Depressed, Hopeless 0 0   PHQ - 2 Score 0 0     Interpretation of Total Score  Total Score Depression Severity:  1-4 = Minimal depression, 5-9 = Mild depression, 10-14 = Moderate depression, 15-19 = Moderately severe depression, 20-27 = Severe depression   Psychosocial Evaluation and Intervention:   Psychosocial Re-Evaluation:   Psychosocial Discharge (Final Psychosocial Re-Evaluation):   Vocational Rehabilitation: Provide vocational rehab assistance to qualifying candidates.   Vocational Rehab Evaluation & Intervention:   Vocational Rehab - 08/02/20 1210      Initial Vocational Rehab Evaluation & Intervention   Assessment shows need for Vocational Rehabilitation No           Education: Education Goals: Education classes will be provided on a weekly basis, covering required topics. Participant will state understanding/return demonstration of topics presented.  Learning Barriers/Preferences:  Learning Barriers/Preferences - 08/02/20 1130      Learning Barriers/Preferences   Learning Barriers None    Learning Preferences Pictoral;Skilled Demonstration           Education Topics: Hypertension, Hypertension Reduction -Define heart disease and high blood pressure. Discus how high blood pressure affects the body and ways to reduce high blood pressure.   Exercise and Your Heart -Discuss why it is important to exercise, the FITT principles of exercise, normal and abnormal  responses to exercise, and how to exercise safely.   Angina -Discuss definition of angina, causes of angina, treatment of angina, and how to decrease risk of having angina.   Cardiac Medications -Review what the following cardiac medications are used for, how they affect the body, and side effects that may occur when taking the medications.  Medications include Aspirin, Beta blockers, calcium channel blockers, ACE Inhibitors, angiotensin receptor blockers, diuretics, digoxin, and antihyperlipidemics.   Congestive Heart Failure -Discuss the definition of CHF, how to live with CHF, the signs and symptoms of CHF, and how keep track of weight and sodium intake.   Heart Disease and Intimacy -Discus the effect sexual activity has on the heart, how changes occur during intimacy as we age, and safety during sexual activity.   Smoking Cessation / COPD -Discuss different methods to quit smoking, the health benefits of quitting smoking, and the definition of COPD.   Nutrition I: Fats -Discuss the types of cholesterol, what  cholesterol does to the heart, and how cholesterol levels can be controlled.   Nutrition II: Labels -Discuss the different components of food labels and how to read food label   Heart Parts/Heart Disease and PAD -Discuss the anatomy of the heart, the pathway of blood circulation through the heart, and these are affected by heart disease.   Stress I: Signs and Symptoms -Discuss the causes of stress, how stress may lead to anxiety and depression, and ways to limit stress.   Stress II: Relaxation -Discuss different types of relaxation techniques to limit stress.   Warning Signs of Stroke / TIA -Discuss definition of a stroke, what the signs and symptoms are of a stroke, and how to identify when someone is having stroke.   Knowledge Questionnaire Score:  Knowledge Questionnaire Score - 08/02/20 1130      Knowledge Questionnaire Score   Pre Score 23/24           Core Components/Risk Factors/Patient Goals at Admission:  Personal Goals and Risk Factors at Admission - 08/02/20 1220      Core Components/Risk Factors/Patient Goals on Admission    Weight Management Yes;Obesity;Weight Loss    Intervention Weight Management: Develop a combined nutrition and exercise program designed to reach desired caloric intake, while maintaining appropriate intake of nutrient and fiber, sodium and fats, and appropriate energy expenditure required for the weight goal.;Weight Management: Provide education and appropriate resources to help participant work on and attain dietary goals.;Weight Management/Obesity: Establish reasonable short term and long term weight goals.;Obesity: Provide education and appropriate resources to help participant work on and attain dietary goals.    Admit Weight 399 lb 11.1 oz (181.3 kg)    Expected Outcomes Short Term: Continue to assess and modify interventions until short term weight is achieved;Long Term: Adherence to nutrition and physical activity/exercise program aimed  toward attainment of established weight goal;Weight Maintenance: Understanding of the daily nutrition guidelines, which includes 25-35% calories from fat, 7% or less cal from saturated fats, less than 200mg  cholesterol, less than 1.5gm of sodium, & 5 or more servings of fruits and vegetables daily;Weight Loss: Understanding of general recommendations for a balanced deficit meal plan, which promotes 1-2 lb weight loss per week and includes a negative energy balance of [phone] kcal/d;Understanding recommendations for meals to include 15-35% energy as protein, 25-35% energy from fat, 35-60% energy from carbohydrates, less than 200mg  of dietary cholesterol, 20-35 gm of total fiber daily;Understanding of distribution of calorie intake throughout the day with the consumption of 4-5 meals/snacks  Hypertension Yes    Intervention Provide education on lifestyle modifcations including regular physical activity/exercise, weight management, moderate sodium restriction and increased consumption of fresh fruit, vegetables, and low fat dairy, alcohol moderation, and smoking cessation.;Monitor prescription use compliance.    Expected Outcomes Short Term: Continued assessment and intervention until BP is < 140/37mm HG in hypertensive participants. < 130/72mm HG in hypertensive participants with diabetes, heart failure or chronic kidney disease.;Long Term: Maintenance of blood pressure at goal levels.    Lipids Yes    Intervention Provide education and support for participant on nutrition & aerobic/resistive exercise along with prescribed medications to achieve LDL 70mg , HDL >40mg .    Expected Outcomes Short Term: Participant states understanding of desired cholesterol values and is compliant with medications prescribed. Participant is following exercise prescription and nutrition guidelines.;Long Term: Cholesterol controlled with medications as prescribed, with individualized exercise RX and with personalized nutrition  plan. Value goals: LDL < 70mg , HDL > 40 mg.    Stress Yes    Intervention Offer individual and/or small group education and counseling on adjustment to heart disease, stress management and health-related lifestyle change. Teach and support self-help strategies.;Refer participants experiencing significant psychosocial distress to appropriate mental health specialists for further evaluation and treatment. When possible, include family members and significant others in education/counseling sessions.    Expected Outcomes Short Term: Participant demonstrates changes in health-related behavior, relaxation and other stress management skills, ability to obtain effective social support, and compliance with psychotropic medications if prescribed.;Long Term: Emotional wellbeing is indicated by absence of clinically significant psychosocial distress or social isolation.           Core Components/Risk Factors/Patient Goals Review:    Core Components/Risk Factors/Patient Goals at Discharge (Final Review):    ITP Comments:  ITP Comments    Row Name 08/02/20 0842           ITP Comments Dr 08/04/20 MD, Medical Director              Comments: See ITP comments. Armanda Magic, RN,BSN 08/11/2020 5:13 PM

## 2020-08-12 ENCOUNTER — Encounter (HOSPITAL_COMMUNITY): Payer: 59

## 2020-08-12 ENCOUNTER — Telehealth (HOSPITAL_COMMUNITY): Payer: Self-pay | Admitting: *Deleted

## 2020-08-12 NOTE — Telephone Encounter (Signed)
Spoke with [place] this morning. [place] says is back and shoulder blade are bothering him. [place] says he will not attend today but plans to return to exercise on Monday.Gladstone Lighter, RN,BSN 08/12/2020 9:03 AM

## 2020-08-15 ENCOUNTER — Encounter (HOSPITAL_COMMUNITY)
Admission: RE | Admit: 2020-08-15 | Discharge: 2020-08-15 | Disposition: A | Discharge status: Home / Self Care | Payer: 59 | Source: Ambulatory Visit | Attending: Cardiology | Admitting: Cardiology

## 2020-08-15 ENCOUNTER — Telehealth: Payer: Self-pay

## 2020-08-15 ENCOUNTER — Other Ambulatory Visit: Payer: Self-pay

## 2020-08-15 DIAGNOSIS — Z951 Presence of aortocoronary bypass graft: Secondary | ICD-10-CM | POA: Insufficient documentation

## 2020-08-15 NOTE — Telephone Encounter (Signed)
Spoke with Gladstone Lighter, RN from Cardiac Rehab regarding patient's recent weight gain. Per RN patient weighed in at 181.3lbs on 08/02/20. Today, patient weighed in at 186.7lbs. Patient has been reluctant to participate in some exercises, however patient did exercise today. Patient has an upcoming appt with you on Monday 08/22/20. Message to inform of patient status.

## 2020-08-15 NOTE — Progress Notes (Signed)
Daily Session Note  Patient Details  Name: Joseph Lang MRN: [id number] Date of Birth: August 12, 1976 Referring Provider:     [place] from 08/02/2020 in [place]  Referring Provider Joseph Prows, MD      Encounter Date: 08/15/2020  Check In:  Session Check In - 08/15/20 1125      Check-In   Supervising physician immediately available to respond to emergencies [facility] immediately available    Physician(s) Dr. Darrick Lang    Location [facility]    Staff Present Joseph Pall, RN, Joseph Glazier, MS, EP-C, CCRP;Joseph Celesta Aver, MS, ACSM CEP, Exercise Physiologist;Joseph Rollene Rotunda, RN, Joseph Laud, MS, ACSM-CEP, Exercise Physiologist    Virtual Visit No    Medication changes reported     No    Fall or balance concerns reported    No    Tobacco Cessation No Change    Warm-up and Cool-down Performed on first and last piece of equipment    Resistance Training Performed Yes    VAD Patient? No    PAD/SET Patient? No      Pain Assessment   Currently in Pain? No/denies    Multiple Pain Sites No           Capillary Blood Glucose: No results found for this or any previous visit (from the past 24 hour(s)).   Exercise Prescription Changes - 08/15/20 1112      Response to Exercise   Blood Pressure (Admit) 128/84    Blood Pressure (Exercise) 122/72    Blood Pressure (Exit) 118/78    Heart Rate (Admit) 73 bpm    Heart Rate (Exercise) 89 bpm    Heart Rate (Exit) 75 bpm    Rating of Perceived Exertion (Exercise) 11    Symptoms none    Comments Off to a good start with exercise.     Duration Progress to 30 minutes of  aerobic without signs/symptoms of physical distress    Intensity THRR unchanged      Progression   Progression Continue to progress workloads to maintain intensity without signs/symptoms of physical distress.    Average METs 2.8      Resistance Training   Training Prescription Yes      Weight 2 lbs    Reps 10-15    Time 10 Minutes      Interval Training   Interval Training No      NuStep   Level 2   Increased to level 3 the last 8 minutes.   SPM 85    Minutes 30    METs 2.8           Social History   Tobacco Use  Smoking Status Former Smoker  . Packs/day: 0.50  . Years: 28.00  . Pack years: 14.00  . Types: Cigarettes  . Quit date: 04/20/2020  . Years since quitting: 0.3  Smokeless Tobacco Never Used  Tobacco Comment   half a pack daily    Goals Met:  No report of cardiac concerns or symptoms  Goals Unmet:  Not Applicable  Comments: Joseph  started cardiac rehab today.  Pt tolerated light exercise without difficulty. VSS, telemetry-Sinus Rhythm, asymptomatic.  Medication list reconciled. Joseph denies pain currently but says sometimes the upper part of his sternal incision feels like a bee is stinging him PSYCHOSOCIAL ASSESSMENT:  PHQ-0. Joseph denies being depressed but is unhappy about his recovery.Joseph Lang weight is up 5.1 kg today from orientation on 08/02/20.  Joseph says he has been eating pizza, burgers lasagna and drinking soda's . Joseph says the only reason his coming to cardiac rehab is so that he can talk to the dietitian about his diet. Upon assessment, lung fields clear upon ascultation. Oxygen saturation 97% on room air. Patient denies shortness of breath and swelling. Dr Irven Shelling office called and notified about weight gain. Advised patient to cut down on soda intake and to watch his portion sizes. Pt oriented to exercise equipment and routine.    Understanding verbalized. Joseph did not use his arms today on the nustep  And used 2 lb weights. Will fax today's  exercise flow sheet to Dr. Irven Shelling  office for review.Joseph Pall, RN,BSN 08/15/2020 2:48 PM   Dr. Fransico Him is Medical Director for Cardiac Rehab at [facility].

## 2020-08-17 ENCOUNTER — Other Ambulatory Visit: Payer: Self-pay

## 2020-08-17 ENCOUNTER — Encounter (HOSPITAL_COMMUNITY)
Admission: RE | Admit: 2020-08-17 | Discharge: 2020-08-17 | Disposition: A | Discharge status: Home / Self Care | Payer: 59 | Source: Ambulatory Visit | Attending: Cardiology | Admitting: Cardiology

## 2020-08-17 VITALS — BP 134/73 | HR 77 | Wt 399.0 lb

## 2020-08-17 DIAGNOSIS — Z951 Presence of aortocoronary bypass graft: Secondary | ICD-10-CM

## 2020-08-17 NOTE — Progress Notes (Signed)
[place] E Sherbert 44 y.o. male Nutrition Note  Visit Diagnosis: S/P CABG x 2 06/27/20   Past Medical History:  Diagnosis Date  . Coronary artery disease 2021  . Dysrhythmia 2021   Afib  . GERD (gastroesophageal reflux disease)   . Hx of deep vein thrombophlebitis of lower extremity   . Hx of gastric bypass   . Hx of pulmonary embolus   . Hypertension   . Obesity   . PONV (postoperative nausea and vomiting)    Throwing up after wt loss surgery  . S/P CABG x 2 06/28/2020: LIMA to LAD, Free Radial graft to D1. LAA ligation with 40 mm Atricure. 06/27/2020  . Sleep apnea   . Tobacco abuse      Medications reviewed.   Current Outpatient Medications:  .  amiodarone (PACERONE) 200 MG tablet, Take 0.5 tablets (100 mg total) by mouth daily. Take 200 mg bid for one week;then take 200 mg daily thereafter, Disp: 60 tablet, Rfl: 0 .  aspirin EC 81 MG tablet, Take 1 tablet (81 mg total) by mouth daily., Disp: 90 tablet, Rfl: 3 .  atorvastatin (LIPITOR) 80 MG tablet, TAKE 1 TABLET BY MOUTH DAILY, Disp: 30 tablet, Rfl: 1 .  baclofen (LIORESAL) 20 MG tablet, Take 1 tablet (20 mg total) by mouth 2 (two) times daily as needed for muscle spasms., Disp: 30 each, Rfl: 0 .  isosorbide mononitrate (IMDUR) 30 MG 24 hr tablet, Take 0.5 tablets (15 mg total) by mouth daily. Please take until finished, Disp: 25 tablet, Rfl: 0 .  metoprolol tartrate (LOPRESSOR) 100 MG tablet, Take 1 tablet (100 mg total) by mouth 2 (two) times daily., Disp: 180 tablet, Rfl: 3 .  oxyCODONE (OXY IR/ROXICODONE) 5 MG immediate release tablet, Take 1 tablet (5 mg total) by mouth every 4 (four) hours as needed for severe pain. (Patient not taking: Reported on 07/28/2020), Disp: 30 tablet, Rfl: 0 .  RABEprazole (ACIPHEX) 20 MG tablet, Take 20 mg by mouth daily. , Disp: , Rfl:  .  XARELTO 20 MG TABS tablet, Take 20 mg by mouth daily., Disp: , Rfl:    Ht Readings from Last 1 Encounters:  08/02/20 6' 0.75" (1.848 m)     Wt Readings  from Last 3 Encounters:  08/02/20 (!) 399 lb 11.1 oz (181.3 kg)  07/21/20 (!) 390 lb 6.4 oz (177.1 kg)  07/18/20 (!) 386 lb 3.2 oz (175.2 kg)     There is no height or weight on file to calculate BMI.   Social History   Tobacco Use  Smoking Status Former Smoker  . Packs/day: 0.50  . Years: 28.00  . Pack years: 14.00  . Types: Cigarettes  . Quit date: 04/20/2020  . Years since quitting: 0.3  Smokeless Tobacco Never Used  Tobacco Comment   half a pack daily     No results found for: CHOL No results found for: HDL No results found for: LDLCALC No results found for: TRIG   Lab Results  Component Value Date   HGBA1C 5.5 06/23/2020     CBG (last 3)  No results for input(s): GLUCAP in the last 72 hours.   Nutrition Note  Spoke with pt. Nutrition Plan and Nutrition Survey goals reviewed with pt.   Pt with good understanding of principles of a heart healthy lifestyle.   Pt wants to lose wt.  He has been dieting/weight cycling since 8th grade.  He has done an exhaustive list of diets during his lifetime. He  always loses weight quickly and then gains it back after the diet is "over". He was working out 2 hours in the gym (cardio+lifting) at some points.  He eats healthy and works out to lose weight. When weight loss plateaus, he stops dieting. No salt shaker. He used to read labels.  He wants to lose 150-200 lbs.   Discussed alcohol intake - currently keeping it to <3-4 drinks per week.   Pt expressed understanding of the information reviewed.    Nutrition Diagnosis ? Obese  III = 40+ related to excessive energy intake as evidenced by a 53 kg/m2  Nutrition Intervention ? Pt's individual nutrition plan reviewed with pt. ? Benefits of adopting Heart Healthy diet discussed when Medficts reviewed.   ? Continue client-centered nutrition education by RD, as part of interdisciplinary care.  Goal(s) ? Pt to identify food quantities necessary to achieve weight loss of 6-24  lb at graduation from cardiac rehab.  ? Pt to build a healthy plate including vegetables, fruits, whole grains, and low-fat dairy products in a heart healthy meal plan.  Plan:    Will provide client-centered nutrition education as part of interdisciplinary care  Monitor and evaluate progress toward nutrition goal with team.   Andrey Campanile, MS, RDN, LDN

## 2020-08-19 ENCOUNTER — Encounter (HOSPITAL_COMMUNITY): Payer: 59

## 2020-08-19 ENCOUNTER — Telehealth (HOSPITAL_COMMUNITY): Payer: Self-pay | Admitting: Internal Medicine

## 2020-08-22 ENCOUNTER — Ambulatory Visit: Payer: 59 | Admitting: Cardiology

## 2020-08-22 ENCOUNTER — Other Ambulatory Visit: Payer: Self-pay

## 2020-08-22 ENCOUNTER — Encounter: Payer: Self-pay | Admitting: Cardiology

## 2020-08-22 ENCOUNTER — Telehealth (HOSPITAL_COMMUNITY): Payer: Self-pay | Admitting: *Deleted

## 2020-08-22 ENCOUNTER — Encounter (HOSPITAL_COMMUNITY): Payer: 59

## 2020-08-22 VITALS — BP 140/82 | HR 51 | Resp 16 | Ht 72.0 in | Wt >= 6400 oz

## 2020-08-22 DIAGNOSIS — I10 Essential (primary) hypertension: Secondary | ICD-10-CM

## 2020-08-22 DIAGNOSIS — Z951 Presence of aortocoronary bypass graft: Secondary | ICD-10-CM

## 2020-08-22 DIAGNOSIS — I480 Paroxysmal atrial fibrillation: Secondary | ICD-10-CM

## 2020-08-22 DIAGNOSIS — I25118 Atherosclerotic heart disease of native coronary artery with other forms of angina pectoris: Secondary | ICD-10-CM

## 2020-08-22 MED ORDER — ISOSORBIDE MONONITRATE ER 30 MG PO TB24
15.0000 mg | ORAL_TABLET | Freq: Every day | ORAL | 4 refills | Status: DC
Start: 2020-08-22 — End: 2021-06-13

## 2020-08-22 MED ORDER — VALSARTAN 80 MG PO TABS: 80.0000 mg | ORAL_TABLET | Freq: Every day | ORAL | 3 refills | Status: DC

## 2020-08-22 NOTE — Telephone Encounter (Signed)
Left message to call cardiac rehab.Gladstone Lighter, RN,BSN 08/22/2020 11:47 AM

## 2020-08-22 NOTE — Progress Notes (Signed)
Primary Physician/Referring:  Georgann HousekeeperHusain, Karrar, MD  Patient ID: Italyhad E Labate, male    DOB: 1975-11-27, 44 y.o.   MRN: [id number]  Chief Complaint  Patient presents with  . Coronary Artery Disease  . Atrial Fibrillation  . Follow-up    1 month   HPI:   Visit date not found  Italyhad E Kelli ChurnLevens  is a 44 y.o. Caucasian male patient with past medical history of paroxysmal atrial fibrillation and cardioversion on 01/17/2020, hypertension, morbid obesity, obstructive sleep apnea on CPAP, tobacco use disorder, history of acute deep vein thrombosis and pulmonary embolism on 04/08/2015 following gastric sleeve resection and again presented with spontaneous right leg DVT on 03/07/2019 presently on long termXarelto.  Due to multivessel coronary disease on 04/19/2020 angiography, he was evaluated by Dr. Elise BenneEd Gerhart and underwent CABG x 2 and LAA ligation on 06/27/2020. Post operatively developed atrial fibrillation with RVR, S/P TEE guided direct-current cardioversion on 07/01/2020.  Presents for 1 month follow-up.  At previous visit decrease amiodarone to 100 mg daily, discontinued digoxin, increase metoprolol from 75 mg to 100 mg twice daily, and discontinued furosemide. Also sent referral to cardiac rehab.    Patient has been to participating in cardiac rehab. He is tolerating his medications well.  He does continue to complain of left arm swelling as well as shoulder pain when he sleeps on his side, this is occurred since his CABG on 06/27/2020.  He continues to follow with Dr. Tyrone SageGerhardt regularly, has a follow-up appointment later this month.  He denies known recurrence of atrial fibrillation.  No chest pain, fatigue, shortness of breath, palpitations, dizziness, orthopnea, PND.  He is no longer taking furosemide.  He does not monitor his blood pressure at home on a regular basis.  He continues to be focused on losing weight, in fact patient has scheduled an appointment with a nutritionist later this week.  Doing  rehab. No home BP. No lasix. Appointment with nutrition this week.   Past Medical History:  Diagnosis Date  . Coronary artery disease 2021  . Dysrhythmia 2021   Afib  . GERD (gastroesophageal reflux disease)   . Hx of deep vein thrombophlebitis of lower extremity   . Hx of gastric bypass   . Hx of pulmonary embolus   . Hypertension   . Obesity   . PONV (postoperative nausea and vomiting)    Throwing up after wt loss surgery  . S/P CABG x 2 06/28/2020: LIMA to LAD, Free Radial graft to D1. LAA ligation with 40 mm Atricure. 06/27/2020  . Sleep apnea   . Tobacco abuse    Past Surgical History:  Procedure Laterality Date  . CARDIAC CATHETERIZATION Left 2021  . CARDIOVERSION    . CARDIOVERSION N/A 04/19/2020   Procedure: CARDIOVERSION;  Surgeon: Yates DecampGanji, Jay, MD;  [facility];  Service: Cardiovascular;  Laterality: N/A;  . CARDIOVERSION N/A 04/26/2020   Procedure: CARDIOVERSION;  Surgeon: Yates DecampGanji, Jay, MD;  [facility];  Service: Cardiovascular;  Laterality: N/A;  . CARDIOVERSION N/A 07/01/2020   Procedure: CARDIOVERSION;  Surgeon: Yates DecampGanji, Jay, MD;  [facility];  Service: Cardiovascular;  Laterality: N/A;  . CLIPPING OF ATRIAL APPENDAGE N/A 06/27/2020   Procedure: CLIPPING OF ATRIAL APPENDAGE USING ATRICURE CLIP SIZE 40;  Surgeon: Delight OvensGerhardt, Edward B, MD;  [facility];  Service: Open Heart Surgery;  Laterality: N/A;  . CORONARY ARTERY BYPASS GRAFT N/A 06/27/2020   Procedure: CORONARY ARTERY BYPASS GRAFTING (CABG) X TWO, USING LEFT INTERNAL MAMMARY ARTERY  AND LEFT ARM RADIAL ARTERY;  Surgeon: Delight Ovens, MD;  Location[facility] OR;  Service: Open Heart Surgery;  Laterality: N/A;  . FRACTURE SURGERY  1995   pelvis  . HIP SURGERY    . LAPAROSCOPIC GASTRIC SLEEVE RESECTION N/A 02/14/2015   Procedure: LAPAROSCOPIC GASTRIC SLEEVE RESECTION WITH UPPER ENDOSCOPY;  Surgeon: Luretha Murphy, MD;  Location: [facility] ORS;  Service: General;  Laterality: N/A;  . LEFT HEART CATH  AND CORONARY ANGIOGRAPHY N/A 04/19/2020   Procedure: LEFT HEART CATH AND CORONARY ANGIOGRAPHY;  Surgeon: Yates Decamp, MD;  Location: [facility] INVASIVE CV LAB;  Service: Cardiovascular;  Laterality: N/A;  . PELVIS CLOSED REDUCTION    . RADIAL ARTERY HARVEST Left 06/27/2020   Procedure: LEFT RADIAL ARTERY HARVEST;  Surgeon: Delight Ovens, MD;  Location[facility] OR;  Service: Open Heart Surgery;  Laterality: Left;  . TEE WITHOUT CARDIOVERSION N/A 06/27/2020   Procedure: TRANSESOPHAGEAL ECHOCARDIOGRAM (TEE);  Surgeon: Delight Ovens, MD;  Location: [facility] OR;  Service: Open Heart Surgery;  Laterality: N/A;  . TEE WITHOUT CARDIOVERSION N/A 07/01/2020   Procedure: TRANSESOPHAGEAL ECHOCARDIOGRAM (TEE);  Surgeon: Yates Decamp, MD;  Location: [facility] ENDOSCOPY;  Service: Cardiovascular;  Laterality: N/A;   Family History  Problem Relation Age of Onset  . Atrial fibrillation Mother   . CAD Mother   . Heart disease Mother   . CAD Father   . Atrial fibrillation Father     Social History   Tobacco Use  . Smoking status: Former Smoker    Packs/day: 0.50    Years: 28.00    Pack years: 14.00    Types: Cigarettes    Quit date: 04/20/2020    Years since quitting: 0.3  . Smokeless tobacco: Never Used  . Tobacco comment: half a pack daily  Substance Use Topics  . Alcohol use: Not Currently   Marital Status: Single  ROS  Review of Systems  Constitutional: Negative for malaise/fatigue and weight gain.  Cardiovascular: Negative for chest pain, claudication, dyspnea on exertion, leg swelling, near-syncope, orthopnea, palpitations, paroxysmal nocturnal dyspnea and syncope.  Respiratory: Negative for shortness of breath.   Hematologic/Lymphatic: Does not bruise/bleed easily.  Musculoskeletal: Positive for joint pain.       Left forearm swelling  Gastrointestinal: Negative for melena.  Neurological: Negative for dizziness and weakness.   Objective  Blood pressure 140/82, pulse (!) 51, resp. rate 16, height 6' (1.829  m), weight (!) 406 lb 3.2 oz (184.3 kg), SpO2 95 %.  Vitals with BMI 08/22/2020 08/17/2020 08/02/2020  Height  - 6' 0.75"  Weight 406 lbs 3 oz 399 lbs 399 lbs 11 oz  BMI 55.08 53 53.09  Systolic 140 - 110  Diastolic 82 - 72  Pulse 51 - -     Physical Exam Constitutional:      Comments: He is well-built and morbidly obese in no acute distress.  Cardiovascular:     Rate and Rhythm: Regular rhythm. Bradycardia present.     Pulses: Normal pulses and intact distal pulses.     Heart sounds: No murmur heard.  No gallop. No S3 or S4 sounds.      Comments: No JVD. Chronic venous stasis pigmentation of bilateral lower extremity.  Bilateral 1+ pitting  leg edema present.  Pulmonary:     Effort: Pulmonary effort is normal.     Breath sounds: Normal breath sounds.  Abdominal:     General: Bowel sounds are normal.     Palpations: Abdomen is soft.  Musculoskeletal:        General: Swelling (Left arm) present.  Skin:    Comments: Left forearm arterial harvest scar noted. No discharge, warmth, or erythema.    Laboratory examination:   Recent Labs    06/30/20 0500 07/01/20 0300 07/02/20 0630  NA 139 140 140  K 3.4* 3.2* 3.7  CL 105 106 105  CO2 27 26 26   GLUCOSE 135* 112* 108*  BUN 5* 6 9  CREATININE 0.78 0.75 0.81  CALCIUM 9.5 9.2 9.8  GFRNONAA >60 >60 >60  GFRAA >60 >60 >60   CrCl cannot be calculated (Patient's most recent lab result is older than the maximum 21 days allowed.).  CMP Latest Ref Rng & Units 07/02/2020 07/01/2020 06/30/2020  Glucose 70 - 99 mg/dL 07/02/2020) 163(W) 466(Z)  BUN 6 - 20 mg/dL 9 6 5(L)  Creatinine 993(T - 1.24 mg/dL 7.01 7.79 3.90  Sodium 135 - 145 mmol/L 140 140 139  Potassium 3.5 - 5.1 mmol/L 3.7 3.2(L) 3.4(L)  Chloride 98 - 111 mmol/L 105 106 105  CO2 22 - 32 mmol/L 26 26 27   Calcium 8.9 - 10.3 mg/dL 9.8 9.2 9.5  Total Protein 6.5 - 8.1 g/dL - - -  Total Bilirubin 0.3 - 1.2 mg/dL - - -  Alkaline Phos 38 - 126 U/L - - -  AST 15 - 41 U/L - - -   ALT 0 - 44 U/L - - -   CBC Latest Ref Rng & Units 06/30/2020 06/29/2020 06/28/2020  WBC 4.0 - 10.5 K/uL 13.7(H) 17.2(H) 14.6(H)  Hemoglobin 13.0 - 17.0 g/dL 12.9(L) 12.9(L) 11.6(L)  Hematocrit 39 - 52 % 40.0 39.9 36.2(L)  Platelets 150 - 400 K/uL 239 222 183   Lipid Panel No results for input(s): CHOL, TRIG, LDLCALC, VLDL, HDL, CHOLHDL, LDLDIRECT in the last 8760 hours.  HEMOGLOBIN A1C Lab Results  Component Value Date   HGBA1C 5.5 06/23/2020   MPG 111.15 06/23/2020   TSH Recent Labs    01/17/20 0813  TSH 1.564   External labs:   Cholesterol, total 230.000 11/03/2019 HDL 42.000 11/03/2019 LDL-C 158.000 11/03/2019 Triglycerides 164.000 11/03/2019  A1C 5.500 11/03/2019 TSH 1.564 01/17/2020  Medications and allergies   Allergies  Allergen Reactions  . Amoxicillin Nausea And Vomiting    Outpatient Medications Prior to Visit  Medication Sig Dispense Refill  . amiodarone (PACERONE) 200 MG tablet Take 0.5 tablets (100 mg total) by mouth daily. Take 200 mg bid for one week;then take 200 mg daily thereafter 60 tablet 0  . aspirin EC 81 MG tablet Take 1 tablet (81 mg total) by mouth daily. 90 tablet 3  . atorvastatin (LIPITOR) 80 MG tablet TAKE 1 TABLET BY MOUTH DAILY 30 tablet 1  . metoprolol tartrate (LOPRESSOR) 100 MG tablet Take 1 tablet (100 mg total) by mouth 2 (two) times daily. 180 tablet 3  . RABEprazole (ACIPHEX) 20 MG tablet Take 20 mg by mouth daily.     11/05/2019 20 MG TABS tablet Take 20 mg by mouth daily.    . isosorbide mononitrate (IMDUR) 30 MG 24 hr tablet Take 0.5 tablets (15 mg total) by mouth daily. Please take until finished 25 tablet 0  . baclofen (LIORESAL) 20 MG tablet Take 1 tablet (20 mg total) by mouth 2 (two) times daily as needed for muscle spasms. 30 each 0  . oxyCODONE (OXY IR/ROXICODONE) 5 MG immediate release tablet Take 1 tablet (5 mg total) by mouth every 4 (four) hours as needed for  severe pain. (Patient not taking: Reported on 07/28/2020) 30 tablet  0   No facility-administered medications prior to visit.   Meds ordered this encounter  Medications  . isosorbide mononitrate (IMDUR) 30 MG 24 hr tablet    Sig: Take 0.5 tablets (15 mg total) by mouth daily. Please take until finished    Dispense:  30 tablet    Refill:  4  . valsartan (DIOVAN) 80 MG tablet    Sig: Take 1 tablet (80 mg total) by mouth daily.    Dispense:  30 tablet    Refill:  3   Medications Discontinued During This Encounter  Medication Reason  . isosorbide mononitrate (IMDUR) 30 MG 24 hr tablet Reorder      Radiology:   PORTABLE CHEST 1 VIEW 01/17/2020 COMPARISON:  04/08/2015 Cardiac silhouette is normal in size. No mediastinal or hilar masses. No evidence of adenopathy. Lungs are clear.  No pleural effusion or pneumothorax. Skeletal structures are grossly intact.  Cardiac Studies:   Echocardiogram: 02/29/2020: LVEF 55-60%, mildly dilated left ventricular cavity, mild LVH, normal diastolic filling pattern, normal left atrial pressure, mildly dilated left atrium, mild MR, trace TR.  Stress Testing: Lexiscan Sestamibi stress test 02/29/2020: Stress EKG at 78% MPHR shows sinus tachycardia, nonspecific T wave inversion in inferior leads, frequent PVC's.  SPECT images show large sized, medium intensity, predominantly reversible perfusion defect in basal to apical, inferoseptal, inferior, and inferolateral myocadium. While study quality may be limited due to attenuation (BMI 52), ischemia cannot be excluded. Stress LVEF 65%. Recommend clinical correlation. High risk study.   Vascular imaging: Ultrasound venous duplex: 03/07/2019: Right: Findings consistent with acute deep vein thrombosis involving the right popliteal vein. There is evidence of superficial vein thrombosis involving a varicose vein of the proximal, medial, right calf.  Left: No evidence of common femoral vein obstruction.  Left Heart Catheterization 04/19/20:  LV: Moderately elevated LVEDP.   LM: Mid 40%. Hazy. Severe diffuse multivessel disease. Ectasia of the LAD, RCA. Prox LAD aneurysmal. Occluded after origin of a very large D1. D1 is very large with mid 90% stenosis. Ipsilateral and contralateral (RCA) to mid LAD noted.  Cx: Prox 70-80% stenosis involves the bifurcation of large OM-1. At the bifurcation there is at least a 80% stenosis. RCA: Diffusely ectatic. PL branch has a focal 50% stenosis.  Slow flow noted in the RCA.   Rec: Complex multivessel CAD and will benefit from CABG to LAD and D1 and OM1 with arterial conduits. Maze and LAA ligation. Patient went into atrial fibrillation in the cath lab. 70 mL contrast used. IV dilt and metoprolol given but did not convert to sinus. He will resume Xarelto tonight. Patient wants to come in and discuss personally at the office. Not happy about CABG potential.  EKG:   EKG 08/22/2020: Sinus bradycardia at a rate of 50 bpm.  Normal axis.  Poor R wave progression, cannot exclude anterior septal infarct old.  Nonspecific T wave abnormality.  Low voltage complexes.  Compared to EKG 05/16/2020, bradycardia no.  Assessment     ICD-10-CM   1. Coronary artery disease involving native coronary artery of native heart with other form of angina pectoris (HCC)  I25.118 EKG 12-Lead    Basic metabolic panel    Basic metabolic panel  2. Paroxysmal atrial fibrillation (HCC). CHA2DS2-VASc Score is 2.  Yearly risk of stroke: 2.3% (CAD, HTN).   I48.0   3. Primary hypertension  I10 Basic metabolic panel    Basic metabolic panel  4. S/P CABG (coronary artery bypass graft)  Z95.1   5. Class 3 severe obesity due to excess calories with serious comorbidity and body mass index (BMI) of 50.0 to 59.9 in adult (HCC)  E66.01    Z68.43     CHA2DS2-VASc Score is 2.  Yearly risk of stroke: 2.3% (CAD, HTN).  Score of 1=1.3; 2=2.2; 3=3.2; 4=4; 5=6.7; 6=9.8; 7=>9.8) -(CHF; HTN; vasc disease DM,  Male = 1; Age <65 =0; 65-74 = 1,  >75 =2; stroke = 2).   Meds  ordered this encounter  Medications  . isosorbide mononitrate (IMDUR) 30 MG 24 hr tablet    Sig: Take 0.5 tablets (15 mg total) by mouth daily. Please take until finished    Dispense:  30 tablet    Refill:  4  . valsartan (DIOVAN) 80 MG tablet    Sig: Take 1 tablet (80 mg total) by mouth daily.    Dispense:  30 tablet    Refill:  3    Medications Discontinued During This Encounter  Medication Reason  . isosorbide mononitrate (IMDUR) 30 MG 24 hr tablet Reorder    Recommendations:   [place] E Kester  is a 44 y.o. Caucasian male patient with past medical history of paroxysmal atrial fibrillation and cardioversion on 01/17/2020, hypertension, morbid obesity, obstructive sleep apnea on CPAP, tobacco use disorder, history of acute deep vein thrombosis and pulmonary embolism on 04/08/2015 following gastric sleeve resection and again presented with spontaneous right leg DVT on 03/07/2019 presently on long termXarelto.  Due to multivessel coronary disease on 04/19/2020 angiography, he was evaluated by Dr. Elise Benne and underwent CABG x 2 and LAA ligation on 06/27/2020. Post operatively developed atrial fibrillation with RVR. I performed TEE guided direct-current cardioversion on 07/01/2020 and he was discharged home the following morning with sinus rhythm, he was also started on digoxin for rate control.  Patient is presently doing well without clinical signs or symptoms of heart failure and he is maintaining sinus rhythm.  Patient continues to be motivated in weight loss, again discussed the importance of this.  Also discussed the importance of continuing to refrain from excessive alcohol as well as tobacco use.  As patient is currently maintaining sinus rhythm but is at high risk for recurrence of atrial fibrillation/flutter, will continue amiodarone 100 mg daily for at least the next 3-6 months.  Will also continue Imdur as patient has a radial artery graft, which is more likely to experience spasm and  therefore chest pain.  We will continue anticoagulation with Xarelto, as he is tolerating this well.  We will also continue aspirin, atorvastatin, and metoprolol titrate.  Patient's blood pressure is elevated above goal.  He is not currently on an ACE or ARB as his blood pressure was soft upon discharge from the hospital.  Will add valsartan 80 mg daily and obtain BMP in 1 week.  Follow-up in 3 months for atrial fibrillation/flutter and coronary artery disease.  Patient was seen in collaboration with Dr. Jacinto Halim. He also reviewed patient's chart and Dr. Jacinto Halim is in agreement of the plan.    Rayford Halsted, PA-C 08/22/2020, 2:44 PM Office: [phone]

## 2020-08-23 ENCOUNTER — Telehealth (HOSPITAL_COMMUNITY): Payer: Self-pay

## 2020-08-23 NOTE — Telephone Encounter (Signed)
Pt insurance is active and benefits verified through [facility]. Co-pay $30.00, DED $4,000.00/$0.00 met, out of pocket $8,550.00/$0.00 met, co-insurance 0%. No pre-authorization required. Passport, 08/23/20 @ 10:44AM, TOI#[id number]  Went over [employer], patient verbalized understanding.

## 2020-08-23 NOTE — Telephone Encounter (Signed)
Pt called and stated he has new insurance as of Nov. 1, updated insurance on file and went over benefits. Pt stated he could not afford the co-pay and would not be able to continue with CR at this time.  Closed referral

## 2020-08-24 ENCOUNTER — Other Ambulatory Visit: Payer: Self-pay | Admitting: Cardiothoracic Surgery

## 2020-08-24 ENCOUNTER — Telehealth (HOSPITAL_COMMUNITY): Payer: Self-pay | Admitting: *Deleted

## 2020-08-24 ENCOUNTER — Encounter (HOSPITAL_COMMUNITY): Payer: 59

## 2020-08-24 DIAGNOSIS — Z951 Presence of aortocoronary bypass graft: Secondary | ICD-10-CM

## 2020-08-24 NOTE — Telephone Encounter (Signed)
Joseph Lang returned call. I asked Joseph Lang if he was interested in participating in virtual cardiac rehab as Joseph Lang says he is unable to continue with phase 2 cardiac rehab due to the co pay. Joseph Lang says he plans to join a gym and work with a Health and safety inspector. Joseph Lang attended 2 sessions at cardiac rehab on 08/15/20 and 08/17/20. Will notify Dr Verl Dicker office. Gladstone Lighter, RN,BSN 08/24/2020 10:08 AM

## 2020-08-24 NOTE — Telephone Encounter (Signed)
Left message with Mom for patient to call cardiac rehab.Gladstone Lighter, RN,BSN 08/24/2020 8:45 AM

## 2020-08-25 ENCOUNTER — Other Ambulatory Visit: Payer: Self-pay

## 2020-08-25 ENCOUNTER — Ambulatory Visit (INDEPENDENT_AMBULATORY_CARE_PROVIDER_SITE_OTHER): Payer: Self-pay | Admitting: Cardiothoracic Surgery

## 2020-08-25 ENCOUNTER — Ambulatory Visit
Admission: RE | Admit: 2020-08-25 | Discharge: 2020-08-25 | Disposition: A | Discharge status: Home / Self Care | Payer: 59 | Source: Ambulatory Visit | Attending: Cardiothoracic Surgery | Admitting: Cardiothoracic Surgery

## 2020-08-25 VITALS — BP 150/79 | HR 60 | Temp 97.6°F | Resp 20 | Ht 72.0 in | Wt >= 6400 oz

## 2020-08-25 DIAGNOSIS — I2510 Atherosclerotic heart disease of native coronary artery without angina pectoris: Secondary | ICD-10-CM

## 2020-08-25 DIAGNOSIS — Z951 Presence of aortocoronary bypass graft: Secondary | ICD-10-CM

## 2020-08-25 NOTE — Progress Notes (Signed)
[address]       [place] [address]             [phone]      [place] E Brubacher [facility] Medical Record #[id number] Date of Birth: 08-05-1976  Referring: Yates Decamp, MD Primary Care: Georgann Housekeeper, MD Primary Cardiologist: No primary care provider on file.   Chief Complaint:   POST OP FOLLOW UP OPERATIVE REPORT DATE OF PROCEDURE:  06/27/2020 PREOPERATIVE DIAGNOSES:  Severe coronary artery disease with total occlusion of the left anterior descending and high-grade stenosis of diagonal.  Paroxysmal atrial fibrillation. POSTOPERATIVE DIAGNOSIS:  Severe coronary artery disease with total occlusion of the left anterior descending and high-grade stenosis of diagonal.  Paroxysmal atrial fibrillation. SURGICAL PROCEDURE: 1.  Coronary artery bypass grafting x2 with the left internal mammary artery to the left anterior descending coronary artery, left radial artery to the diagonal coronary artery with left radial artery harvest 2.Placement of AtriCure clip on  Left atrium, 40 mm.  History of Present Illness:     Patient returns office today in follow-up after coronary artery bypass grafting in September 13.  Patient continues to improve, especially with mobility.  He would like to return to work in the near future.  His major complaint is persistent swelling in the left forearm at the radial harvest site  Past Medical History:  Diagnosis Date  . Coronary artery disease 2021  . Dysrhythmia 2021   Afib  . GERD (gastroesophageal reflux disease)   . Hx of deep vein thrombophlebitis of lower extremity   . Hx of gastric bypass   . Hx of pulmonary embolus   . Hypertension   . Obesity   . PONV (postoperative nausea and vomiting)    Throwing up after wt loss surgery  . S/P CABG x 2 06/28/2020: LIMA to LAD, Free Radial graft to D1. LAA ligation with 40 mm Atricure. 06/27/2020  . Sleep apnea   . Tobacco abuse      Social History   Tobacco Use  Smoking Status  Former Smoker  . Packs/day: 0.50  . Years: 28.00  . Pack years: 14.00  . Types: Cigarettes  . Quit date: 04/20/2020  . Years since quitting: 0.3  Smokeless Tobacco Never Used  Tobacco Comment   half a pack daily    Social History   Substance and Sexual Activity  Alcohol Use Not Currently     Allergies  Allergen Reactions  . Amoxicillin Nausea And Vomiting    Current Outpatient Medications  Medication Sig Dispense Refill  . amiodarone (PACERONE) 200 MG tablet Take 0.5 tablets (100 mg total) by mouth daily. Take 200 mg bid for one week;then take 200 mg daily thereafter 60 tablet 0  . aspirin EC 81 MG tablet Take 1 tablet (81 mg total) by mouth daily. 90 tablet 3  . atorvastatin (LIPITOR) 80 MG tablet TAKE 1 TABLET BY MOUTH DAILY 30 tablet 1  . isosorbide mononitrate (IMDUR) 30 MG 24 hr tablet Take 0.5 tablets (15 mg total) by mouth daily. Please take until finished 30 tablet 4  . metoprolol tartrate (LOPRESSOR) 100 MG tablet Take 1 tablet (100 mg total) by mouth 2 (two) times daily. 180 tablet 3  . RABEprazole (ACIPHEX) 20 MG tablet Take 20 mg by mouth daily.     . valsartan (DIOVAN) 80 MG tablet Take 1 tablet (80 mg total) by mouth daily. 30 tablet 3  . XARELTO 20 MG TABS tablet Take 20  mg by mouth daily.     No current facility-administered medications for this visit.       Physical Exam: BP (!) 150/79   Pulse 60   Temp 97.6 F (36.4 C) (Skin)   Resp 20   Ht 6' (1.829 m)   Wt (!) 406 lb (184.2 kg)   SpO2 93% Comment: RA  BMI 55.06 kg/m  General appearance: alert, cooperative and no distress Head: Normocephalic, without obvious abnormality, atraumatic Lymph nodes: Cervical, supraclavicular, and axillary nodes normal. Resp: clear to auscultation bilaterally Cardio: regular rate and rhythm, S1, S2 normal, no murmur, click, rub or gallop GI: soft, non-tender; bowel sounds normal; no masses,  no organomegaly Extremities: His left forearm is more swollen than the  right, there is no evidence of infection, the hand is neurovascularly intact Neurologic: Grossly normal  Diagnostic Studies & Laboratory data:     Recent Radiology Findings:    DG Chest 2 View  Result Date: 08/25/2020 CLINICAL DATA:  Postop CABG 06/27/2020. Shortness of breath with exertion. EXAM: CHEST - 2 VIEW COMPARISON:  07/18/2020 and earlier. FINDINGS: Sternotomy for CABG. LEFT atrial appendage clip. Cardiac silhouette normal in size, unchanged. Hilar and mediastinal contours unremarkable. Small to moderate-sized LEFT pleural effusion with associated mild passive atelectasis in the LEFT LOWER LOBE. Lungs otherwise clear. Pulmonary vascularity normal. Degenerative changes and DISH involving the thoracic spine. IMPRESSION: Small to moderate-sized LEFT pleural effusion and associated mild passive atelectasis in the LEFT LOWER LOBE. Electronically Signed   By: Hulan Saas M.D.   On: 08/25/2020 13:41  I have independently reviewed the above radiology studies  and reviewed the findings with the patient.     Recent Lab Findings: Lab Results  Component Value Date   WBC 13.7 (H) 06/30/2020   HGB 12.9 (L) 06/30/2020   HCT 40.0 06/30/2020   PLT 239 06/30/2020   GLUCOSE 108 (H) 07/02/2020   ALT 33 06/24/2020   AST 23 06/24/2020   NA 140 07/02/2020   K 3.7 07/02/2020   CL 105 07/02/2020   CREATININE 0.81 07/02/2020   BUN 9 07/02/2020   CO2 26 07/02/2020   TSH 1.564 01/17/2020   INR 2.0 (H) 07/01/2020   HGBA1C 5.5 06/23/2020      Assessment / Plan:   #1 status post recent coronary artery bypass grafting with left radial harvest,-making good progress without recurrent angina #2 still some lymphedema left forearm associated with left radial artery harvest-left hand is neurovascularly intact-patient had investigated a lymphedema sleeve for the left forearm due to his size there are no standard sleeves that he can obtain-we will have to be custom made, we discussed using Ace wrap  from the fingers up and see if this helps his lymphedema if it does we can have a constant sleep made   Patient with return to work December 13 minimal lifting no more than 20 pounds for 3 months   Return to see [place] in 3 months       Medication Changes: No orders of the defined types were placed in this encounter.  Delight Ovens MD      [address] [place] [place] [address] Office [phone]     08/28/2020 9:09 PM

## 2020-08-26 ENCOUNTER — Encounter (HOSPITAL_COMMUNITY): Payer: 59

## 2020-08-29 ENCOUNTER — Encounter (HOSPITAL_COMMUNITY): Payer: 59

## 2020-08-31 ENCOUNTER — Encounter (HOSPITAL_COMMUNITY): Payer: 59

## 2020-09-01 ENCOUNTER — Telehealth: Payer: Self-pay

## 2020-09-01 NOTE — Telephone Encounter (Signed)
[id number] : Patient called and left a message on VM stating that he last saw Celeste in office, and his BP has not changed and wants Dr. Jacinto Halim to give him a call.   [phone]

## 2020-09-02 ENCOUNTER — Telehealth: Payer: Self-pay

## 2020-09-02 ENCOUNTER — Encounter (HOSPITAL_COMMUNITY): Payer: 59

## 2020-09-02 DIAGNOSIS — I10 Essential (primary) hypertension: Secondary | ICD-10-CM

## 2020-09-02 NOTE — Telephone Encounter (Signed)
[id number] : Patient called and left a message on VM stating that he last saw Celeste in office, and his BP has not changed and wants Dr. Jacinto Halim to give him a call. Jacinto Halim is not here, do you mind calling him?   [phone]

## 2020-09-02 NOTE — Telephone Encounter (Signed)
Called at 105pm, no answer, left a voicemail to call us back.

## 2020-09-02 NOTE — Telephone Encounter (Signed)
If I am out how can I Let patientsknow I am out of town and my partners can handle some of the calls

## 2020-09-05 ENCOUNTER — Encounter (HOSPITAL_COMMUNITY): Payer: 59

## 2020-09-05 MED ORDER — VALSARTAN 80 MG PO TABS
80.0000 mg | ORAL_TABLET | Freq: Every day | ORAL | 3 refills | Status: DC
Start: 2020-09-05 — End: 2020-10-10

## 2020-09-05 NOTE — Telephone Encounter (Signed)
Spoke with patient regarding hypertension. He has not started the valsartan that was prescribed at his last visit with me. Patient reports he was confused as to why he needed to better control his blood pressure. Discussed with him regarding risks of hypertension, patient verbalized understanding. He is willing to try taking the valsartan. I have resent prescription to pharmacy. Patient will get BMP in 1 week.

## 2020-09-05 NOTE — Telephone Encounter (Signed)
I have spoken to him and addressed hypertension. He has not started previously prescribed valsartan, he will do so with BMP in 1 week.

## 2020-09-07 ENCOUNTER — Encounter (HOSPITAL_COMMUNITY): Payer: 59

## 2020-09-09 ENCOUNTER — Encounter (HOSPITAL_COMMUNITY): Payer: 59

## 2020-09-12 ENCOUNTER — Encounter (HOSPITAL_COMMUNITY): Payer: 59

## 2020-09-14 ENCOUNTER — Encounter (HOSPITAL_COMMUNITY): Payer: 59

## 2020-09-16 ENCOUNTER — Encounter (HOSPITAL_COMMUNITY): Payer: 59

## 2020-09-19 ENCOUNTER — Encounter (HOSPITAL_COMMUNITY): Payer: 59

## 2020-09-21 ENCOUNTER — Encounter (HOSPITAL_COMMUNITY): Payer: 59

## 2020-09-23 ENCOUNTER — Encounter (HOSPITAL_COMMUNITY): Payer: 59

## 2020-09-26 ENCOUNTER — Other Ambulatory Visit: Payer: Self-pay | Admitting: Cardiology

## 2020-09-26 ENCOUNTER — Encounter (HOSPITAL_COMMUNITY): Payer: 59

## 2020-09-26 DIAGNOSIS — I25118 Atherosclerotic heart disease of native coronary artery with other forms of angina pectoris: Secondary | ICD-10-CM

## 2020-09-26 DIAGNOSIS — E7800 Pure hypercholesterolemia, unspecified: Secondary | ICD-10-CM

## 2020-09-28 ENCOUNTER — Encounter (HOSPITAL_COMMUNITY): Payer: 59

## 2020-09-30 ENCOUNTER — Encounter (HOSPITAL_COMMUNITY): Payer: 59

## 2020-10-10 ENCOUNTER — Ambulatory Visit: Payer: 59 | Admitting: Student

## 2020-10-10 ENCOUNTER — Other Ambulatory Visit: Payer: Self-pay

## 2020-10-10 ENCOUNTER — Encounter: Payer: Self-pay | Admitting: Student

## 2020-10-10 ENCOUNTER — Telehealth: Payer: Self-pay

## 2020-10-10 VITALS — BP 146/78 | HR 50 | Ht 72.0 in | Wt >= 6400 oz

## 2020-10-10 DIAGNOSIS — I480 Paroxysmal atrial fibrillation: Secondary | ICD-10-CM

## 2020-10-10 DIAGNOSIS — Z6841 Body Mass Index (BMI) 40.0 and over, adult: Secondary | ICD-10-CM

## 2020-10-10 DIAGNOSIS — F419 Anxiety disorder, unspecified: Secondary | ICD-10-CM

## 2020-10-10 DIAGNOSIS — I25118 Atherosclerotic heart disease of native coronary artery with other forms of angina pectoris: Secondary | ICD-10-CM

## 2020-10-10 DIAGNOSIS — I10 Essential (primary) hypertension: Secondary | ICD-10-CM

## 2020-10-10 MED ORDER — BENAZEPRIL HCL 10 MG PO TABS: 10.0000 mg | ORAL_TABLET | Freq: Every day | ORAL | 3 refills | Status: DC

## 2020-10-10 NOTE — Telephone Encounter (Signed)
Patient called and stated that you gave her a referral to a nutritionalist. He called them and they do not do plans. They did however, refer him to another doctor that does help with meal planning.   Dr. Quillian Quince [facility]  He would like referral to go there please.

## 2020-10-10 NOTE — Progress Notes (Signed)
Primary Physician/Referring:  Georgann Housekeeper, MD  Patient ID: Joseph Lang, male    DOB: 11-04-1975, 44 y.o.   MRN: [id number]        DOS: 10/10/20  Chief Complaint  Patient presents with  . Palpitations   HPI:    Joseph Lang  is a 44 y.o. Caucasian male patient with past medical history of paroxysmal atrial fibrillation and cardioversion on 01/17/2020 and 07/01/2020, hypertension, morbid obesity, obstructive sleep apnea on CPAP, tobacco use disorder, history of acute deep vein thrombosis and pulmonary embolism on 04/08/2015 following gastric sleeve resection and again presented with spontaneous right leg DVT on 03/07/2019 presently on long termXarelto.  Due to multivessel coronary disease on 04/19/2020 angiography, he was evaluated by Dr. Elise Benne and underwent CABG x 2 and LAA ligation on 06/27/2020.  Patient presents for urgent visit with complaints of an "anxious feeling" this morning from 6-10am. He reports he feeling very anxious when he woke up this morning, but he could not tell about what.  Patient went to work and feeling of anxiety spontaneously resolved after he called our office and made an appointment.  He reports associated feeling of heart racing symptoms.  Denies shortness of breath, chest pain, dizziness during this episode.  Patient also reports that over the last 2 weeks he has experienced occasional lightheadedness when bending over or changing positions, which only lasts for a few seconds.  He has also had occasional symptoms of palpitations about once per week for the last 2 weeks.  He denies other associated symptoms.  Of note patient was previously prescribed valsartan for hypertension, he took this for 1 week but experienced dysuria and therefore stopped it.  He complains of continued swelling in his left forearm, for which Dr. Tyrone Sage has recommended Ace wrapping or custom compression sleeve as it is likely lymphedema.  Patient also expresses frustration today regarding  difficulty losing weight, and in fact he has continued to gain weight.  States he would like to see a nutritionist who can put together a day by day meal plan for him.  Of note patient had to stop attending cardiac rehab last month due to cost concerns.  Past Medical History:  Diagnosis Date  . Coronary artery disease 2021  . Dysrhythmia 2021   Afib  . GERD (gastroesophageal reflux disease)   . Hx of deep vein thrombophlebitis of lower extremity   . Hx of gastric bypass   . Hx of pulmonary embolus   . Hypertension   . Obesity   . PONV (postoperative nausea and vomiting)    Throwing up after wt loss surgery  . S/P CABG x 2 06/28/2020: LIMA to LAD, Free Radial graft to D1. LAA ligation with 40 mm Atricure. 06/27/2020  . Sleep apnea   . Tobacco abuse    Past Surgical History:  Procedure Laterality Date  . CARDIAC CATHETERIZATION Left 2021  . CARDIOVERSION    . CARDIOVERSION N/A 04/19/2020   Procedure: CARDIOVERSION;  Surgeon: Yates Decamp, MD;  Location: [facility] ENDOSCOPY;  Service: Cardiovascular;  Laterality: N/A;  . CARDIOVERSION N/A 04/26/2020   Procedure: CARDIOVERSION;  Surgeon: Yates Decamp, MD;  Location: [facility] ENDOSCOPY;  Service: Cardiovascular;  Laterality: N/A;  . CARDIOVERSION N/A 07/01/2020   Procedure: CARDIOVERSION;  Surgeon: Yates Decamp, MD;  Location: [facility] ENDOSCOPY;  Service: Cardiovascular;  Laterality: N/A;  . CLIPPING OF ATRIAL APPENDAGE N/A 06/27/2020   Procedure: CLIPPING OF ATRIAL APPENDAGE USING ATRICURE CLIP SIZE 40;  Surgeon: Delight Ovens,  MD;  Location: [facility] OR;  Service: Open Heart Surgery;  Laterality: N/A;  . CORONARY ARTERY BYPASS GRAFT N/A 06/27/2020   Procedure: CORONARY ARTERY BYPASS GRAFTING (CABG) X TWO, USING LEFT INTERNAL MAMMARY ARTERY AND LEFT ARM RADIAL ARTERY;  Surgeon: Delight OvensGerhardt, Edward B, MD;  Location: [facility] OR;  Service: Open Heart Surgery;  Laterality: N/A;  . FRACTURE SURGERY  1995   pelvis  . HIP SURGERY    . LAPAROSCOPIC GASTRIC SLEEVE RESECTION N/A  02/14/2015   Procedure: LAPAROSCOPIC GASTRIC SLEEVE RESECTION WITH UPPER ENDOSCOPY;  Surgeon: Luretha MurphyMatthew Martin, MD;  Location: [facility] ORS;  Service: General;  Laterality: N/A;  . LEFT HEART CATH AND CORONARY ANGIOGRAPHY N/A 04/19/2020   Procedure: LEFT HEART CATH AND CORONARY ANGIOGRAPHY;  Surgeon: Yates DecampGanji, Jay, MD;  Location: [facility] INVASIVE CV LAB;  Service: Cardiovascular;  Laterality: N/A;  . PELVIS CLOSED REDUCTION    . RADIAL ARTERY HARVEST Left 06/27/2020   Procedure: LEFT RADIAL ARTERY HARVEST;  Surgeon: Delight OvensGerhardt, Edward B, MD;  [facility];  Service: Open Heart Surgery;  Laterality: Left;  . TEE WITHOUT CARDIOVERSION N/A 06/27/2020   Procedure: TRANSESOPHAGEAL ECHOCARDIOGRAM (TEE);  Surgeon: Delight OvensGerhardt, Edward B, MD;  [facility];  Service: Open Heart Surgery;  Laterality: N/A;  . TEE WITHOUT CARDIOVERSION N/A 07/01/2020   Procedure: TRANSESOPHAGEAL ECHOCARDIOGRAM (TEE);  Surgeon: Yates DecampGanji, Jay, MD;  [facility];  Service: Cardiovascular;  Laterality: N/A;   Family History  Problem Relation Age of Onset  . Atrial fibrillation Mother   . CAD Mother   . Heart disease Mother   . CAD Father   . Atrial fibrillation Father     Social History   Tobacco Use  . Smoking status: Former Smoker    Packs/day: 0.50    Years: 28.00    Pack years: 14.00    Types: Cigarettes    Quit date: 04/20/2020    Years since quitting: 0.4  . Smokeless tobacco: Never Used  . Tobacco comment: half a pack daily  Substance Use Topics  . Alcohol use: Not Currently   Marital Status: Single  ROS  Review of Systems  Constitutional: Negative for malaise/fatigue and weight gain.  Cardiovascular: Positive for palpitations. Negative for chest pain, claudication, dyspnea on exertion, leg swelling, near-syncope, orthopnea, paroxysmal nocturnal dyspnea and syncope.  Respiratory: Negative for shortness of breath.   Hematologic/Lymphatic: Does not bruise/bleed easily.  Musculoskeletal: Positive for joint pain.        Left forearm swelling  Gastrointestinal: Negative for melena.  Neurological: Negative for dizziness and weakness.  Psychiatric/Behavioral: The patient is nervous/anxious.    Objective  Blood pressure (!) 146/78, pulse (!) 50, height 6' (1.829 m), weight (!) 425 lb (192.8 kg), SpO2 96 %.  Vitals with BMI 10/10/2020 08/25/2020 08/22/2020  Height 6\' 0"  6\' 0"  6\' 0"   Weight 425 lbs 406 lbs 406 lbs 3 oz  BMI 57.63 55.05 55.08  Systolic 146 150 [id number]  Diastolic 78 79 82  Pulse 50 60 51     Physical Exam Vitals reviewed.  Constitutional:      Comments: He is well-built and morbidly obese in no acute distress.  HENT:     Head: Normocephalic and atraumatic.  Cardiovascular:     Rate and Rhythm: Regular rhythm. Bradycardia present.     Pulses: Normal pulses and intact distal pulses.     Heart sounds: S1 normal and S2 normal. No murmur heard. No gallop. No S3 or S4 sounds.      Comments: No  JVD. Chronic venous stasis pigmentation of bilateral lower extremity.  Bilateral 1+ pitting  leg edema present.  Pulmonary:     Effort: Pulmonary effort is normal. No respiratory distress.     Breath sounds: Normal breath sounds. No wheezing, rhonchi or rales.  Musculoskeletal:        General: Swelling (Left arm) present.  Skin:    Comments: Left forearm arterial harvest scar noted. No discharge, warmth, or erythema.  Neurological:     Mental Status: He is alert.    Laboratory examination:   Recent Labs    06/30/20 0500 07/01/20 0300 07/02/20 0630  NA 139 140 140  K 3.4* 3.2* 3.7  CL 105 106 105  CO2 27 26 26   GLUCOSE 135* 112* 108*  BUN 5* 6 9  CREATININE 0.78 0.75 0.81  CALCIUM 9.5 9.2 9.8  GFRNONAA >60 >60 >60  GFRAA >60 >60 >60   CrCl cannot be calculated (Patient's most recent lab result is older than the maximum 21 days allowed.).  CMP Latest Ref Rng & Units 07/02/2020 07/01/2020 06/30/2020  Glucose 70 - 99 mg/dL 07/02/2020) 782(N) 562(Z)  BUN 6 - 20 mg/dL 9 6 5(L)  Creatinine 308(M -  1.24 mg/dL 5.78 4.69 6.29  Sodium 135 - 145 mmol/L 140 140 139  Potassium 3.5 - 5.1 mmol/L 3.7 3.2(L) 3.4(L)  Chloride 98 - 111 mmol/L 105 106 105  CO2 22 - 32 mmol/L 26 26 27   Calcium 8.9 - 10.3 mg/dL 9.8 9.2 9.5  Total Protein 6.5 - 8.1 g/dL - - -  Total Bilirubin 0.3 - 1.2 mg/dL - - -  Alkaline Phos 38 - 126 U/L - - -  AST 15 - 41 U/L - - -  ALT 0 - 44 U/L - - -   CBC Latest Ref Rng & Units 06/30/2020 06/29/2020 06/28/2020  WBC 4.0 - 10.5 K/uL 13.7(H) 17.2(H) 14.6(H)  Hemoglobin 13.0 - 17.0 g/dL 12.9(L) 12.9(L) 11.6(L)  Hematocrit 39.0 - 52.0 % 40.0 39.9 36.2(L)  Platelets 150 - 400 K/uL 239 222 183   Lipid Panel No results for input(s): CHOL, TRIG, LDLCALC, VLDL, HDL, CHOLHDL, LDLDIRECT in the last 8760 hours.  HEMOGLOBIN A1C Lab Results  Component Value Date   HGBA1C 5.5 06/23/2020   MPG 111.15 06/23/2020   TSH Recent Labs    01/17/20 0813  TSH 1.564   External labs:   Cholesterol, total 230.000 11/03/2019 HDL 42.000 11/03/2019 LDL-C 158.000 11/03/2019 Triglycerides 164.000 11/03/2019  A1C 5.500 11/03/2019 TSH 1.564 01/17/2020  Medications and allergies   Allergies  Allergen Reactions  . Amoxicillin Nausea And Vomiting    Outpatient Medications Prior to Visit  Medication Sig Dispense Refill  . amiodarone (PACERONE) 200 MG tablet Take 0.5 tablets (100 mg total) by mouth daily. Take 200 mg bid for one week;then take 200 mg daily thereafter 60 tablet 0  . aspirin EC 81 MG tablet Take 1 tablet (81 mg total) by mouth daily. 90 tablet 3  . atorvastatin (LIPITOR) 80 MG tablet TAKE 1 TABLET BY MOUTH DAILY 30 tablet 1  . clotrimazole-betamethasone (LOTRISONE) cream Apply 1 application topically as needed.    . isosorbide mononitrate (IMDUR) 30 MG 24 hr tablet Take 0.5 tablets (15 mg total) by mouth daily. Please take until finished 30 tablet 4  . RABEprazole (ACIPHEX) 20 MG tablet Take 20 mg by mouth daily.     11/05/2019 20 MG TABS tablet Take 20 mg by mouth daily.    .  metoprolol tartrate (  LOPRESSOR) 100 MG tablet Take 1 tablet (100 mg total) by mouth 2 (two) times daily. (Patient not taking: Reported on 10/10/2020) 180 tablet 3   No facility-administered medications prior to visit.   Meds ordered this encounter  Medications  . benazepril (LOTENSIN) 10 MG tablet    Sig: Take 1 tablet (10 mg total) by mouth daily.    Dispense:  30 tablet    Refill:  3   There are no discontinued medications.    Radiology:   PORTABLE CHEST 1 VIEW 01/17/2020 COMPARISON:  04/08/2015 Cardiac silhouette is normal in size. No mediastinal or hilar masses. No evidence of adenopathy. Lungs are clear.  No pleural effusion or pneumothorax. Skeletal structures are grossly intact.  Cardiac Studies:   Echocardiogram: 02/29/2020: LVEF 55-60%, mildly dilated left ventricular cavity, mild LVH, normal diastolic filling pattern, normal left atrial pressure, mildly dilated left atrium, mild MR, trace TR.  Stress Testing: Lexiscan Sestamibi stress test 02/29/2020: Stress EKG at 78% MPHR shows sinus tachycardia, nonspecific T wave inversion in inferior leads, frequent PVC's.  SPECT images show large sized, medium intensity, predominantly reversible perfusion defect in basal to apical, inferoseptal, inferior, and inferolateral myocadium. While study quality may be limited due to attenuation (BMI 52), ischemia cannot be excluded. Stress LVEF 65%. Recommend clinical correlation. High risk study.   Vascular imaging: Ultrasound venous duplex: 03/07/2019: Right: Findings consistent with acute deep vein thrombosis involving the right popliteal vein. There is evidence of superficial vein thrombosis involving a varicose vein of the proximal, medial, right calf.  Left: No evidence of common femoral vein obstruction.  Left Heart Catheterization 04/19/20:  LV: Moderately elevated LVEDP.  LM: Mid 40%. Hazy. Severe diffuse multivessel disease. Ectasia of the LAD, RCA. Prox LAD aneurysmal.  Occluded after origin of a very large D1. D1 is very large with mid 90% stenosis. Ipsilateral and contralateral (RCA) to mid LAD noted.  Cx: Prox 70-80% stenosis involves the bifurcation of large OM-1. At the bifurcation there is at least a 80% stenosis. RCA: Diffusely ectatic. PL branch has a focal 50% stenosis.  Slow flow noted in the RCA.   Rec: Complex multivessel CAD and will benefit from CABG to LAD and D1 and OM1 with arterial conduits. Maze and LAA ligation. Patient went into atrial fibrillation in the cath lab. 70 mL contrast used. IV dilt and metoprolol given but did not convert to sinus. He will resume Xarelto tonight. Patient wants to come in and discuss personally at the office. Not happy about CABG potential.  EKG:   EKG 10/10/2020: Marked sinus bradycardia rate of 49 bpm.  Normal axis.  Poor progression, cannot exclude anteroseptal infarct old.  Nonspecific T wave abnormality.  Low voltage complexes.  Compared to EKG 08/22/2020, no significant change.  Assessment     ICD-10-CM   1. Anxiety  F41.9   2. Coronary artery disease involving native coronary artery of native heart with other form of angina pectoris (HCC)  I25.118 Lipid Panel With LDL/HDL Ratio  3. Paroxysmal atrial fibrillation (HCC). CHA2DS2-VASc Score is 2.  Yearly risk of stroke: 2.3% (CAD, HTN).   I48.0 EKG 12-Lead  4. Primary hypertension  I10 Basic metabolic panel  5. Class 3 severe obesity due to excess calories with serious comorbidity and body mass index (BMI) of 50.0 to 59.9 in adult [facility])  E66.01 Ambulatory referral to Nutrition and Diabetic Education   Z68.43     CHA2DS2-VASc Score is 2.  Yearly risk of stroke: 2.3% (CAD, HTN).  Meds  ordered this encounter  Medications  . benazepril (LOTENSIN) 10 MG tablet    Sig: Take 1 tablet (10 mg total) by mouth daily.    Dispense:  30 tablet    Refill:  3    There are no discontinued medications.  Recommendations:   Joseph Lang  is a 44 y.o. Caucasian male  patient with past medical history of paroxysmal atrial fibrillation and cardioversion on 01/17/2020 and 07/01/2020, hypertension, morbid obesity, obstructive sleep apnea on CPAP, tobacco use disorder, history of acute deep vein thrombosis and pulmonary embolism on 04/08/2015 following gastric sleeve resection and again presented with spontaneous right leg DVT on 03/07/2019 presently on long termXarelto.  Due to multivessel coronary disease on 04/19/2020 angiography, he was evaluated by Dr. Elise Benne and underwent CABG x 2 and LAA ligation on 06/27/2020.  Patient presents for urgent visit today with primary complaint of an episode of anxiety this morning associated with heart racing symptoms.  There are no clinical signs of heart failure and patient is maintaining sinus rhythm.  Patient symptoms are suggestive of anxiety which may be related to underlying coronary artery disease.  Patient also states he has recently been easily angry as well.  As patient had no other associated symptoms and episode spontaneously resolved after scheduling her appointment suspect heart racing symptoms were also related to anxiety.  Recommend patient notify our office if he continues to have these episodes.  Also suggest that he follows up with his PCP regarding anger and anxiety.  In regard to patient's occasional lightheadedness reassured him that this is common with some of the medications he is on.  Counseled him to notify office if symptoms worsen or if he has syncope, near syncope, or fall.  Regard to patient's occasional brief palpitations, also reassured him as his EKG today reveals sinus bradycardia.  He is palpitations may be related to underlying episodes of atrial fibrillation.  Patient is tolerating Xarelto without bleeding diathesis.  We will continue amiodarone for at least the next 2-5 months and metoprolol, as well as Xarelto.  In regard to left arm swelling, patient's hand is neurovascularly intact.  Will defer  further management to Dr. Tyrone Sage as patient has follow-up appointment in February 2022.  Patient's blood pressure remains elevated above goal.  He has previously been on benazepril, will restart at this time.  Patient will obtain repeat BMP within the next 1 month either at [facility] or with his PCP for an annual physical.  We will also obtain repeat lipid profile testing.  Discussed with patient at length regarding weight loss and diet.  Will refer patient to nutrition services for assistance developing day-to-day meal plan.  We will also continue other cardiovascular medications including aspirin, atorvastatin, Imdur.  Follow up in 3 months for atrial fibrillation and coronary artery disease.    This was a 45-minute encounter with face-to-face counseling (diet, nutrition, weight loss), medical records review, coordination of care, explanation of complex medical issues (particularly regarding cardiovascular risk factor management and medication explanation), complex medical decision making.     Rayford Halsted, PA-C 10/10/2020, 12:24 PM Office: [phone]

## 2020-10-11 NOTE — Telephone Encounter (Signed)
Referral has been sent to the office of Dr. Quillian Quince for weight loss and diet planning.

## 2020-11-03 ENCOUNTER — Ambulatory Visit (INDEPENDENT_AMBULATORY_CARE_PROVIDER_SITE_OTHER): Payer: Self-pay | Admitting: Family Medicine

## 2020-11-17 ENCOUNTER — Ambulatory Visit (INDEPENDENT_AMBULATORY_CARE_PROVIDER_SITE_OTHER): Payer: Self-pay | Admitting: Family Medicine

## 2020-11-24 ENCOUNTER — Ambulatory Visit: Payer: 59 | Admitting: Cardiothoracic Surgery

## 2020-11-25 ENCOUNTER — Other Ambulatory Visit: Payer: Self-pay | Admitting: Cardiology

## 2020-11-25 DIAGNOSIS — I25118 Atherosclerotic heart disease of native coronary artery with other forms of angina pectoris: Secondary | ICD-10-CM

## 2020-11-25 DIAGNOSIS — E7800 Pure hypercholesterolemia, unspecified: Secondary | ICD-10-CM

## 2020-12-27 ENCOUNTER — Other Ambulatory Visit: Payer: Self-pay | Admitting: Physician Assistant

## 2020-12-27 DIAGNOSIS — I480 Paroxysmal atrial fibrillation: Secondary | ICD-10-CM

## 2020-12-28 ENCOUNTER — Encounter (INDEPENDENT_AMBULATORY_CARE_PROVIDER_SITE_OTHER): Payer: Self-pay | Admitting: Family Medicine

## 2020-12-28 ENCOUNTER — Other Ambulatory Visit: Payer: Self-pay

## 2020-12-28 ENCOUNTER — Ambulatory Visit (INDEPENDENT_AMBULATORY_CARE_PROVIDER_SITE_OTHER): Payer: 59 | Admitting: Family Medicine

## 2020-12-28 ENCOUNTER — Other Ambulatory Visit: Payer: Self-pay | Admitting: Cardiology

## 2020-12-28 VITALS — BP 122/73 | HR 49 | Temp 97.9°F | Ht 73.0 in | Wt >= 6400 oz

## 2020-12-28 DIAGNOSIS — Z6841 Body Mass Index (BMI) 40.0 and over, adult: Secondary | ICD-10-CM

## 2020-12-28 DIAGNOSIS — E7800 Pure hypercholesterolemia, unspecified: Secondary | ICD-10-CM

## 2020-12-28 DIAGNOSIS — Z9189 Other specified personal risk factors, not elsewhere classified: Secondary | ICD-10-CM | POA: Diagnosis not present

## 2020-12-28 DIAGNOSIS — Z1331 Encounter for screening for depression: Secondary | ICD-10-CM

## 2020-12-28 DIAGNOSIS — Z9884 Bariatric surgery status: Secondary | ICD-10-CM

## 2020-12-28 DIAGNOSIS — E7849 Other hyperlipidemia: Secondary | ICD-10-CM

## 2020-12-28 DIAGNOSIS — R0602 Shortness of breath: Secondary | ICD-10-CM | POA: Diagnosis not present

## 2020-12-28 DIAGNOSIS — I25118 Atherosclerotic heart disease of native coronary artery with other forms of angina pectoris: Secondary | ICD-10-CM | POA: Diagnosis not present

## 2020-12-28 DIAGNOSIS — R5383 Other fatigue: Secondary | ICD-10-CM

## 2020-12-28 DIAGNOSIS — G4733 Obstructive sleep apnea (adult) (pediatric): Secondary | ICD-10-CM

## 2020-12-28 DIAGNOSIS — I10 Essential (primary) hypertension: Secondary | ICD-10-CM

## 2020-12-28 DIAGNOSIS — Z0289 Encounter for other administrative examinations: Secondary | ICD-10-CM

## 2020-12-28 DIAGNOSIS — Z951 Presence of aortocoronary bypass graft: Secondary | ICD-10-CM

## 2020-12-28 DIAGNOSIS — Z9989 Dependence on other enabling machines and devices: Secondary | ICD-10-CM

## 2020-12-29 DIAGNOSIS — Z9189 Other specified personal risk factors, not elsewhere classified: Secondary | ICD-10-CM | POA: Insufficient documentation

## 2020-12-29 DIAGNOSIS — R0602 Shortness of breath: Secondary | ICD-10-CM | POA: Insufficient documentation

## 2020-12-29 DIAGNOSIS — R5383 Other fatigue: Secondary | ICD-10-CM | POA: Insufficient documentation

## 2020-12-29 LAB — CBC WITH DIFFERENTIAL/PLATELET
Basophils Absolute: 0 10*3/uL (ref 0.0–0.2)
Basos: 0 %
EOS (ABSOLUTE): 0.3 10*3/uL (ref 0.0–0.4)
Eos: 3 %
Hematocrit: 44 % (ref 37.5–51.0)
Hemoglobin: 14.1 g/dL (ref 13.0–17.7)
Immature Grans (Abs): 0 10*3/uL (ref 0.0–0.1)
Immature Granulocytes: 0 %
Lymphocytes Absolute: 2.5 10*3/uL (ref 0.7–3.1)
Lymphs: 29 %
MCH: 27.6 pg (ref 26.6–33.0)
MCHC: 32 g/dL (ref 31.5–35.7)
MCV: 86 fL (ref 79–97)
Monocytes Absolute: 0.6 10*3/uL (ref 0.1–0.9)
Monocytes: 7 %
Neutrophils Absolute: 5.3 10*3/uL (ref 1.4–7.0)
Neutrophils: 61 %
Platelets: 266 10*3/uL (ref 150–450)
RBC: 5.1 x10E6/uL (ref 4.14–5.80)
RDW: 14.9 % (ref 11.6–15.4)
WBC: 8.7 10*3/uL (ref 3.4–10.8)

## 2020-12-29 LAB — T4, FREE: Free T4: 1.43 ng/dL (ref 0.82–1.77)

## 2020-12-29 LAB — VITAMIN B12: Vitamin B-12: 342 pg/mL (ref 232–1245)

## 2020-12-29 LAB — INSULIN, RANDOM: INSULIN: 13 u[IU]/mL (ref 2.6–24.9)

## 2020-12-29 LAB — COMPREHENSIVE METABOLIC PANEL
ALT: 27 IU/L (ref 0–44)
AST: 23 IU/L (ref 0–40)
Albumin/Globulin Ratio: 1.9 (ref 1.2–2.2)
Albumin: 4.1 g/dL (ref 4.0–5.0)
Alkaline Phosphatase: 98 IU/L (ref 44–121)
BUN/Creatinine Ratio: 16 (ref 9–20)
BUN: 13 mg/dL (ref 6–24)
Bilirubin Total: 0.9 mg/dL (ref 0.0–1.2)
CO2: 24 mmol/L (ref 20–29)
Calcium: 9.8 mg/dL (ref 8.7–10.2)
Chloride: 105 mmol/L (ref 96–106)
Creatinine, Ser: 0.83 mg/dL (ref 0.76–1.27)
Globulin, Total: 2.2 g/dL (ref 1.5–4.5)
Glucose: 90 mg/dL (ref 65–99)
Potassium: 4.6 mmol/L (ref 3.5–5.2)
Sodium: 143 mmol/L (ref 134–144)
Total Protein: 6.3 g/dL (ref 6.0–8.5)
eGFR: 109 mL/min/{1.73_m2} (ref >59)

## 2020-12-29 LAB — FOLATE: Folate: 4.9 ng/mL (ref >3.0)

## 2020-12-29 LAB — LIPID PANEL
Chol/HDL Ratio: 2.7 ratio (ref 0.0–5.0)
Cholesterol, Total: 127 mg/dL (ref 100–199)
HDL: 47 mg/dL (ref >39)
LDL Chol Calc (NIH): 65 mg/dL (ref 0–99)
Triglycerides: 75 mg/dL (ref 0–149)
VLDL Cholesterol Cal: 15 mg/dL (ref 5–40)

## 2020-12-29 LAB — HEMOGLOBIN A1C
Est. average glucose Bld gHb Est-mCnc: 123 mg/dL
Hgb A1c MFr Bld: 5.9 % — ABNORMAL HIGH (ref 4.8–5.6)

## 2020-12-29 LAB — TSH: TSH: 3 u[IU]/mL (ref 0.450–4.500)

## 2020-12-29 LAB — VITAMIN D 25 HYDROXY (VIT D DEFICIENCY, FRACTURES): Vit D, 25-Hydroxy: 23 ng/mL — ABNORMAL LOW (ref 30.0–100.0)

## 2020-12-29 LAB — T3: T3, Total: 107 ng/dL (ref 71–180)

## 2021-01-02 ENCOUNTER — Telehealth: Payer: Self-pay

## 2021-01-02 NOTE — Telephone Encounter (Signed)
1/2 daily is fine, Do not refill until he sees [place] as we may consider stopping it if appropriate. If he does not have any, call for 7 tablets 200 gm 1/2 daily

## 2021-01-02 NOTE — Telephone Encounter (Signed)
Patient called for amiodarone refill, he is out and has only been taking 1/2 tab daily. He sees Celeste next week.

## 2021-01-03 ENCOUNTER — Other Ambulatory Visit: Payer: Self-pay

## 2021-01-03 DIAGNOSIS — I480 Paroxysmal atrial fibrillation: Secondary | ICD-10-CM

## 2021-01-03 MED ORDER — AMIODARONE HCL 200 MG PO TABS: 100.0000 mg | ORAL_TABLET | Freq: Every day | ORAL | 0 refills | Status: DC

## 2021-01-04 NOTE — Progress Notes (Signed)
Dear Elvin Soeleste Cantwell, PA-C,   Thank you for referring Joseph Lang to our clinic. The following note includes my evaluation and treatment recommendations.  Chief Complaint:   OBESITY Joseph Lang (MR# [id number]) is a 45 y.o. male who presents for evaluation and treatment of obesity and related comorbidities. Current BMI is Body mass index is 55.41 kg/m. Joseph has been struggling with his weight for many years and has been unsuccessful in either losing weight, maintaining weight loss, or reaching his healthy weight goal.  Joseph is currently in the action stage of change and ready to dedicate time achieving and maintaining a healthier weight. Joseph is interested in becoming our patient and working on intensive lifestyle modifications including (but not limited to) diet and exercise for weight loss.  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   Single, his mother lives with him (age 380).  He says he eats out 6-10 times per week.  No snacks.  No cravings.  Occasionally skips meals - breakfast or lunch.  Worst food habit is that he overeats, he reports.  Markice's habits were reviewed today and are as follows: His family eats meals together, he thinks his family will eat healthier with him, his desired weight loss is 170 pounds, he has been heavy most of his life, he started gaining weight in elementary school, his heaviest weight ever was 450 pounds, he skips meals frequently, he is frequently drinking liquids with calories, he frequently makes poor food choices and he frequently eats larger portions than normal.  Depression Screen [facility]eshule[facility]'s Food and Mood (modified PHQ-9) score was 0.  Dep[facility] screen [facility] 12/28/2020  Decreased Interest 0  Down, Depressed, Hopeless 0  PHQ - 2 Score 0  Altered sleeping 0  Tired, decreased energy 0  Change in appetite 0  Feeling bad or failure about yourself  0  Trouble concentrating 0  Moving slowly or fidgety/restless 0  Suicidal thoughts 0   PHQ-9 Score 0  Difficult doing work/chores Not difficult at all   Assessment/Plan:   1. Other fatigue Joseph denies daytime somnolence and denies waking up still tired. [place] has a history of symptoms of snoring. Joseph generally gets 7-9 hours of sleep per night, and states that he has generally restful sleep. Snoring is present. Apneic episodes are present. Epworth Sleepiness Score is 5.  Joseph has OSA and uses a CPAP machine.  Joseph does feel that his weight is causing his energy to be lower than it should be. Fatigue may be related to obesity, depression or many other causes. Labs will be ordered, and in the meanwhile, Joseph will focus on self care including making healthy food choices, increasing physical activity and focusing on stress reduction.  - Vitamin B12 - CBC with Differential/Platelet - Comprehensive metabolic panel - Folate - Hemoglobin A1c - Insulin, random - T3 - T4, free - TSH - VITAMIN D 25 Hydroxy (Vit-D Deficiency, Fractures)  2. SOB (shortness of breath) on exertion Joseph notes increasing shortness of breath with exercising and seems to be worsening over time with weight gain. He notes getting out of breath sooner with activity than he used to. This has gotten worse recently. Joseph denies shortness of breath at rest or orthopnea.  Joseph does feel that he gets out of breath more easily that he used to when he exercises. Jamarl's shortness of breath appears to be obesity related and exercise induced. He has agreed to work on weight loss and gradually  increase exercise to treat his exercise induced shortness of breath. Will continue to monitor closely.  - Vitamin B12 - CBC with Differential/Platelet - Comprehensive metabolic panel - Folate - Hemoglobin A1c - Insulin, random - Lipid panel - T3 - T4, free - TSH - VITAMIN D 25 Hydroxy (Vit-D Deficiency, Fractures)  3. Coronary artery disease involving native coronary artery of native heart with other form of angina  pectoris (HCC) CABG in 06/2020.  Strong family history of CAD.  Taking Xarelto 20 mg daily, isosorbide 15 mg daily, and aspirin 81 mg daily.  Plan:  Check labs today.  Medication management per Cardiology.  - Vitamin B12 - CBC with Differential/Platelet - Comprehensive metabolic panel - Folate - Hemoglobin A1c - Insulin, random - Lipid panel - T3 - T4, free - TSH - VITAMIN D 25 Hydroxy (Vit-D Deficiency, Fractures)  4. Essential hypertension At goal. Medications: Lopressor 100 mg daily benazepril 10 mg daily.  No symptoms or concerns.  Plan: Avoid buying foods that are: processed, frozen, or prepackaged to avoid excess salt. We will continue to monitor closely alongside his PCP and/or Specialist.  Regular follow up with PCP and specialists was also encouraged.  Check labs today.  BP Readings from Last 3 Encounters:  12/28/20 122/73  10/10/20 (!) 146/78  08/25/20 (!) 150/79   Lab Results  Component Value Date   CREATININE 0.83 12/28/2020   - Vitamin B12 - CBC with Differential/Platelet - Comprehensive metabolic panel - Folate - Hemoglobin A1c - Insulin, random - Lipid panel - T3 - T4, free - TSH - VITAMIN D 25 Hydroxy (Vit-D Deficiency, Fractures)  5. Other hyperlipidemia Course: Controlled. Lipid-lowering medications: Lipitor 80 mg daily.  Tolerating well with no issues.  Plan:  Check labs today.  Dietary changes: Increase soluble fiber, decrease simple carbohydrates, decrease saturated fat. Exercise changes: Moderate to vigorous-intensity aerobic activity 150 minutes per week or as tolerated. We will continue to monitor along with PCP/specialists as it pertains to his weight loss journey.  Lab Results  Component Value Date   CHOL 127 12/28/2020   HDL 47 12/28/2020   LDLCALC 65 12/28/2020   TRIG 75 12/28/2020   CHOLHDL 2.7 12/28/2020   Lab Results  Component Value Date   ALT 27 12/28/2020   AST 23 12/28/2020   ALKPHOS 98 12/28/2020   BILITOT 0.9 12/28/2020    - Vitamin B12 - CBC with Differential/Platelet - Comprehensive metabolic panel - Folate - Hemoglobin A1c - Insulin, random - Lipid panel - T3 - T4, free - TSH - VITAMIN D 25 Hydroxy (Vit-D Deficiency, Fractures)  6. OSA on CPAP He was diagnosed years ago.  Has used his CPAP religiously.  No recheck for many years.  OSA is a cause of systemic hypertension and is associated with an increased incidence of stroke, heart failure, atrial fibrillation, and coronary heart disease. Severe OSA increases all-cause mortality and  cardiovascular mortality.   Plan:  Recommend he follow-up with Sleep Medicine for re-evaluation every 5 years or so.  Goal: Treatment of OSA via CPAP compliance and weight loss. . Plasma ghrelin levels (appetite or "hunger hormone") are significantly higher in OSA patients than in BMI-matched controls, but decrease to levels similar to those of obese patients without OSA after CPAP treatment.  . Weight loss improves OSA by several mechanisms, including reduction in fatty tissue in the throat (i.e. parapharyngeal fat) and the tongue. Loss of abdominal fat increases mediastinal traction on the upper airway making it less likely to collapse  during sleep. . Studies have also shown that compliance with CPAP treatment improves leptin (hunger inhibitory hormone) imbalance.  7. S/P laparoscopic sleeve gastrectomy May 2016 In 2015.  Eats multiple small meals per day.  He is at risk for malnutrition due to his previous bariatric surgery.   Plan:  Will check labs today.  Counseling  You may need to eat 3 meals and 2 snacks, or 5 small meals each day in order to reach your protein and calorie goals.   Allow at least 15 minutes for each meal so that you can eat mindfully. Listen to your body so that you do not overeat. For most people, your sleeve or pouch will comfortably hold 4-6 ounces.  Eat foods from all food groups. This includes fruits and vegetables, grains, dairy, and  meat and other proteins.  Include a protein-rich food at every meal and snack, and eat the protein food first.   You should be taking a Bariatric Multivitamin as well as calcium.   - Vitamin B12 - CBC with Differential/Platelet - Comprehensive metabolic panel - Folate - Hemoglobin A1c - Insulin, random - Lipid panel - T3 - T4, free - TSH - VITAMIN D 25 Hydroxy (Vit-D Deficiency, Fractures)  8. Depression screening [place] was screened for depression as part of his new patient workup.  PHQ-9 is 0.  9. At risk for diabetes mellitus - [place] was given diabetes prevention education and counseling today of more than 23 minutes.  - Counseled patient on pathophysiology of disease and meaning/ implication of lab results.  - Reviewed how certain foods can either stimulate or inhibit insulin release, and subsequently affect hunger pathways  - Importance of following a healthy meal plan with limiting amounts of simple carbohydrates discussed with patient - Effects of regular aerobic exercise on blood sugar regulation reviewed and encouraged an eventual goal of 30 min 5d/week or more as a minimum.  - Briefly discussed treatment options, which always include dietary and lifestyle modification as first line.   - Handouts provided at patient's desire and/or told to go online to the American Diabetes Association website for further information.  10. Class 3 severe obesity with serious comorbidity and body mass index (BMI) of 50.0 to 59.9 in adult, unspecified obesity type (HCC)  [place] is currently in the action stage of change and his goal is to continue with weight loss efforts. I recommend [place] begin the structured treatment plan as follows:  He has agreed to the Category 3 Plan.  Exercise goals: As is.   Behavioral modification strategies: increasing lean protein intake, decreasing simple carbohydrates, no skipping meals, meal planning and cooking strategies and planning for success.  He was  informed of the importance of frequent follow-up visits to maximize his success with intensive lifestyle modifications for his multiple health conditions. He was informed we would discuss his lab results at his next visit unless there is a critical issue that needs to be addressed sooner. [place] agreed to keep his next visit at the agreed upon time to discuss these results.  Objective:   Blood pressure 122/73, pulse (!) 49, temperature 97.9 F (36.6 C), height 6\' 1"  (1.854 m), weight (!) 420 lb (190.5 kg), SpO2 96 %. Body mass index is 55.41 kg/m.  Indirect Calorimeter completed today shows a VO2 of 355 and a REE of 2468.  His calculated basal metabolic rate is thus his basal metabolic rate is worse than expected.  General: Cooperative, alert, well developed, in no acute distress.  HEENT: Conjunctivae and lids unremarkable. Cardiovascular: Regular rhythm.  Lungs: Normal work of breathing. Neurologic: No focal deficits.   Lab Results  Component Value Date   CREATININE 0.83 12/28/2020   BUN 13 12/28/2020   NA 143 12/28/2020   K 4.6 12/28/2020   CL 105 12/28/2020   CO2 24 12/28/2020   Lab Results  Component Value Date   ALT 27 12/28/2020   AST 23 12/28/2020   ALKPHOS 98 12/28/2020   BILITOT 0.9 12/28/2020   Lab Results  Component Value Date   HGBA1C 5.9 (H) 12/28/2020   HGBA1C 5.5 06/23/2020   Lab Results  Component Value Date   INSULIN 13.0 12/28/2020   Lab Results  Component Value Date   TSH 3.000 12/28/2020   Lab Results  Component Value Date   CHOL 127 12/28/2020   HDL 47 12/28/2020   LDLCALC 65 12/28/2020   TRIG 75 12/28/2020   CHOLHDL 2.7 12/28/2020   Lab Results  Component Value Date   WBC 8.7 12/28/2020   HGB 14.1 12/28/2020   HCT 44.0 12/28/2020   MCV 86 12/28/2020   PLT 266 12/28/2020   Attestation Statements:   This is the patient's first visit at [facility]. The patient's NEW PATIENT PACKET was reviewed at length. Included in  the packet: current and past health history, medications, allergies, ROS, gynecologic history (women only), surgical history, family history, social history, weight history, weight loss surgery history (for those that have had weight loss surgery), nutritional evaluation, mood and food questionnaire, PHQ9, Epworth questionnaire, sleep habits questionnaire, patient life and health improvement goals questionnaire. These will all be scanned into the patient's chart under media.   During the visit, I independently reviewed the patient's EKG, bioimpedance scale results, and indirect calorimeter results. I used this information to tailor a meal plan for the patient that will help him to lose weight and will improve his obesity-related conditions going forward. I performed a medically necessary appropriate examination and/or evaluation. I discussed the assessment and treatment plan with the patient. The patient was provided an opportunity to ask questions and all were answered. The patient agreed with the plan and demonstrated an understanding of the instructions. Labs were ordered at this visit and will be reviewed at the next visit unless more critical results need to be addressed immediately. Clinical information was updated and documented in the EMR.   I, Insurance claims handler, CMA, am acting as Energy manager for [employer], DO.  I have reviewed the above documentation for accuracy and completeness, and I agree with the above. Carlye Grippe, D.O.  The 21st Century Cures Act was signed into law in 2016 which includes the topic of electronic health records.  This provides immediate access to information in MyChart.  This includes consultation notes, operative notes, office notes, lab results and pathology reports.  If you have any questions about what you read please let us know at your next visit so we can discuss your concerns and take corrective action if need be.  We are right here with you.  Orders  Placed This Encounter  Procedures  . Vitamin B12  . CBC with Differential/Platelet  . Comprehensive metabolic panel  . Folate  . Hemoglobin A1c  . Insulin, random  . Lipid panel  . T3  . T4, free  . TSH  . VITAMIN D 25 Hydroxy (Vit-D Deficiency, Fractures)    Medications Discontinued During This Encounter  Medication Reason  . clotrimazole-betamethasone (LOTRISONE) cream

## 2021-01-09 ENCOUNTER — Ambulatory Visit: Payer: 59 | Admitting: Student

## 2021-01-09 NOTE — Progress Notes (Signed)
Primary Physician/Referring:  Georgann Housekeeper, MD  Patient ID: Joseph E Danielski, male    DOB: 02/25/1976, 45 y.o.   MRN: [id number]        DOS: 01/10/21  Chief Complaint  Patient presents with  . Coronary Artery Disease  . Follow-up    3 month   HPI:    Joseph Lang  is a 45 y.o. Caucasian male patient with past medical history of paroxysmal atrial fibrillation and cardioversion on 01/17/2020 and 07/01/2020, hypertension, morbid obesity, obstructive sleep apnea on CPAP, tobacco use disorder, history of acute deep vein thrombosis and pulmonary embolism on 04/08/2015 following gastric sleeve resection and again presented with spontaneous right leg DVT on 03/07/2019 presently on long termXarelto.  Due to multivessel coronary disease on 04/19/2020 angiography, he was evaluated by Dr. Elise Benne and underwent CABG x 2 and LAA ligation on 06/27/2020.  Patient presents for 3 month follow up of atrial fibrillation and coronary artery disease. At last visit restarted patient on benazapril and CMP remained stable. No home BP readings.  Patient's primary concern continues to be left forearm swelling, however patient is no longer using Ace wrapping or custom compression sleeve recommended by Dr. Tyrone Sage for likely lymphedema.  Patient denies chest pain, dyspnea, palpitations, syncope, near syncope, dizziness.  Denies leg swelling, orthopnea, PND.  He has now established with weight management team, and has lost 11 pounds since his last visit.  Past Medical History:  Diagnosis Date  . Coronary artery disease 2021  . Dysrhythmia 2021   Afib  . GERD (gastroesophageal reflux disease)   . History of blood clots   . Hx of deep vein thrombophlebitis of lower extremity   . Hx of gastric bypass    sleeve  . Hx of pulmonary embolus   . Hypertension   . Obesity   . Other hyperlipidemia   . PONV (postoperative nausea and vomiting)    Throwing up after wt loss surgery  . S/P CABG x 2 06/28/2020: LIMA to LAD,  Free Radial graft to D1. LAA ligation with 40 mm Atricure. 06/27/2020  . Sleep apnea   . Swelling of both lower extremities   . Tobacco abuse    Past Surgical History:  Procedure Laterality Date  . CARDIAC CATHETERIZATION Left 2021  . CARDIOVERSION    . CARDIOVERSION N/A 04/19/2020   Procedure: CARDIOVERSION;  Surgeon: Yates Decamp, MD;  Location: [facility] ENDOSCOPY;  Service: Cardiovascular;  Laterality: N/A;  . CARDIOVERSION N/A 04/26/2020   Procedure: CARDIOVERSION;  Surgeon: Yates Decamp, MD;  Location: [facility] ENDOSCOPY;  Service: Cardiovascular;  Laterality: N/A;  . CARDIOVERSION N/A 07/01/2020   Procedure: CARDIOVERSION;  Surgeon: Yates Decamp, MD;  Location: [facility] ENDOSCOPY;  Service: Cardiovascular;  Laterality: N/A;  . CLIPPING OF ATRIAL APPENDAGE N/A 06/27/2020   Procedure: CLIPPING OF ATRIAL APPENDAGE USING ATRICURE CLIP SIZE 40;  Surgeon: Delight Ovens, MD;  Location: [facility] OR;  Service: Open Heart Surgery;  Laterality: N/A;  . CORONARY ARTERY BYPASS GRAFT N/A 06/27/2020   Procedure: CORONARY ARTERY BYPASS GRAFTING (CABG) X TWO, USING LEFT INTERNAL MAMMARY ARTERY AND LEFT ARM RADIAL ARTERY;  Surgeon: Delight Ovens, MD;  Location: [facility] OR;  Service: Open Heart Surgery;  Laterality: N/A;  . FRACTURE SURGERY  1995   pelvis  . HIP SURGERY    . LAPAROSCOPIC GASTRIC SLEEVE RESECTION N/A 02/14/2015   Procedure: LAPAROSCOPIC GASTRIC SLEEVE RESECTION WITH UPPER ENDOSCOPY;  Surgeon: Luretha Murphy, MD;  Location: [facility] ORS;  Service: General;  Laterality:  N/A;  . LEFT HEART CATH AND CORONARY ANGIOGRAPHY N/A 04/19/2020   Procedure: LEFT HEART CATH AND CORONARY ANGIOGRAPHY;  Surgeon: Yates DecampGanji, Jay, MD;  Location: [facility] INVASIVE CV LAB;  Service: Cardiovascular;  Laterality: N/A;  . PELVIS CLOSED REDUCTION    . RADIAL ARTERY HARVEST Left 06/27/2020   Procedure: LEFT RADIAL ARTERY HARVEST;  Surgeon: Delight OvensGerhardt, Edward B, MD;  [facility];  Service: Open Heart Surgery;  Laterality: Left;  . TEE WITHOUT CARDIOVERSION N/A  06/27/2020   Procedure: TRANSESOPHAGEAL ECHOCARDIOGRAM (TEE);  Surgeon: Delight OvensGerhardt, Edward B, MD;  [facility];  Service: Open Heart Surgery;  Laterality: N/A;  . TEE WITHOUT CARDIOVERSION N/A 07/01/2020   Procedure: TRANSESOPHAGEAL ECHOCARDIOGRAM (TEE);  Surgeon: Yates DecampGanji, Jay, MD;  [facility];  Service: Cardiovascular;  Laterality: N/A;   Family History  Problem Relation Age of Onset  . Atrial fibrillation Mother   . CAD Mother   . Heart disease Mother   . Hypertension Mother   . Hyperlipidemia Mother   . Sleep apnea Mother   . CAD Father   . Atrial fibrillation Father   . Diabetes Father   . Hypertension Father   . Hyperlipidemia Father   . Sleep apnea Father     Social History   Tobacco Use  . Smoking status: Current Every Day Smoker    Packs/day: 0.50    Years: 30.00    Pack years: 15.00    Types: Cigarettes    Last attempt to quit: 04/20/2020    Years since quitting: 0.7  . Smokeless tobacco: Never Used  . Tobacco comment: half a pack daily  Substance Use Topics  . Alcohol use: Not Currently   Marital Status: Single  ROS  Review of Systems  Constitutional: Negative for malaise/fatigue and weight gain.  Cardiovascular: Negative for chest pain, claudication, dyspnea on exertion, leg swelling, near-syncope, orthopnea, palpitations, paroxysmal nocturnal dyspnea and syncope.  Respiratory: Negative for shortness of breath.   Hematologic/Lymphatic: Does not bruise/bleed easily.  Musculoskeletal: Positive for joint pain.       Left forearm swelling  Gastrointestinal: Negative for melena.  Neurological: Negative for dizziness and weakness.  Psychiatric/Behavioral: The patient is nervous/anxious.    Objective  Blood pressure 132/84, pulse (!) 53, temperature 98 F (36.7 C), temperature source Temporal, resp. rate 16, height 6\' 1"  (1.854 m), weight (!) 409 lb 6.4 oz (185.7 kg), SpO2 98 %.  Vitals with BMI 01/10/2021 12/28/2020 10/10/2020  Height 6\' 1"  6\' 1"  6\' 0"    Weight 409 lbs 6 oz 420 lbs 425 lbs  BMI 54.03 55.42 57.63  Systolic 132 122 [id number]  Diastolic 84 73 78  Pulse 53 49 50     Physical Exam Vitals reviewed.  Constitutional:      Comments: He is well-built and morbidly obese in no acute distress.  HENT:     Head: Normocephalic and atraumatic.  Cardiovascular:     Rate and Rhythm: Regular rhythm. Bradycardia present.     Pulses: Normal pulses and intact distal pulses.     Heart sounds: S1 normal and S2 normal. No murmur heard. No gallop. No S3 or S4 sounds.      Comments: No JVD. Chronic venous stasis pigmentation of bilateral lower extremity. Pulmonary:     Effort: Pulmonary effort is normal. No respiratory distress.     Breath sounds: Normal breath sounds. No wheezing, rhonchi or rales.  Chest:     Comments: Well-healed sternotomy scar Musculoskeletal:  General: Swelling (Left arm) present.     Right lower leg: Edema (minimal) present.     Left lower leg: Edema (minimal) present.  Skin:    General: Skin is warm and dry.     Comments: Left forearm arterial harvest scar noted. No discharge, warmth, or erythema.  Neurological:     Mental Status: He is alert.    Laboratory examination:   Recent Labs    06/30/20 0500 07/01/20 0300 07/02/20 0630 12/28/20 0909  NA 139 140 140 143  K 3.4* 3.2* 3.7 4.6  CL 105 106 105 105  CO2 27 26 26 24   GLUCOSE 135* 112* 108* 90  BUN 5* 6 9 13   CREATININE 0.78 0.75 0.81 0.83  CALCIUM 9.5 9.2 9.8 9.8  GFRNONAA >60 >60 >60  --   GFRAA >60 >60 >60  --    estimated creatinine clearance is 192.2 mL/min (by C-G formula based on SCr of 0.83 mg/dL).  CMP Latest Ref Rng & Units 12/28/2020 07/02/2020 07/01/2020  Glucose 65 - 99 mg/dL 90 07/04/2020) 07/03/2020)  BUN 6 - 24 mg/dL 13 9 6   Creatinine 0.76 - 1.27 mg/dL 875(I 433(I  Sodium 134 - 144 mmol/L 143 140 140  Potassium 3.5 - 5.2 mmol/L 4.6 3.7 3.2(L)  Chloride 96 - 106 mmol/L 105 105 106  CO2 20 - 29 mmol/L 24 26 26   Calcium 8.7 - 10.2  mg/dL 9.8 9.8 9.2  Total Protein 6.0 - 8.5 g/dL 6.3 - -  Total Bilirubin 0.0 - 1.2 mg/dL 0.9 - -  Alkaline Phos 44 - 121 IU/L 98 - -  AST 0 - 40 IU/L 23 - -  ALT 0 - 44 IU/L 27 - -   CBC Latest Ref Rng & Units 12/28/2020 06/30/2020 06/29/2020  WBC 3.4 - 10.8 x10E3/uL 8.7 13.7(H) 17.2(H)  Hemoglobin 13.0 - 17.7 g/dL 12.9(L) 12.9(L)  Hematocrit 37.5 - 51.0 % 44.0 40.0 39.9  Platelets 150 - 450 x10E3/uL 266 239 222   Lipid Panel Recent Labs    12/28/20 0909  CHOL 127  TRIG 75  LDLCALC 65  HDL 47  CHOLHDL 2.7    HEMOGLOBIN A1C Lab Results  Component Value Date   HGBA1C 5.9 (H) 12/28/2020   MPG 111.15 06/23/2020   TSH Recent Labs    01/17/20 0813 12/28/20 0909  TSH 1.564 3.000   External labs:   Cholesterol, total 230.000 11/03/2019 HDL 42.000 11/03/2019 LDL-C 158.000 11/03/2019 Triglycerides 164.000 11/03/2019  A1C 5.500 11/03/2019 TSH 1.564 01/17/2020  Medications and allergies   Allergies  Allergen Reactions  . Amoxicillin Nausea And Vomiting    Outpatient Medications Prior to Visit  Medication Sig Dispense Refill  . amiodarone (PACERONE) 200 MG tablet Take 0.5 tablets (100 mg total) by mouth daily. 7 tablet 0  . aspirin EC 81 MG tablet Take 1 tablet (81 mg total) by mouth daily. 90 tablet 3  . atorvastatin (LIPITOR) 80 MG tablet TAKE 1 TABLET BY MOUTH DAILY 90 tablet 1  . benazepril (LOTENSIN) 10 MG tablet Take 1 tablet (10 mg total) by mouth daily. 30 tablet 3  . isosorbide mononitrate (IMDUR) 30 MG 24 hr tablet Take 0.5 tablets (15 mg total) by mouth daily. Please take until finished 30 tablet 4  . metoprolol tartrate (LOPRESSOR) 100 MG tablet Take 1 tablet (100 mg total) by mouth 2 (two) times daily. 180 tablet 3  . RABEprazole (ACIPHEX) 20 MG tablet Take 20 mg by mouth daily.     11/05/2019  XARELTO 20 MG TABS tablet Take 20 mg by mouth daily.     No facility-administered medications prior to visit.   No orders of the defined types were placed in this  encounter.  There are no discontinued medications.    Radiology:   PORTABLE CHEST 1 VIEW 01/17/2020 COMPARISON:  04/08/2015 Cardiac silhouette is normal in size. No mediastinal or hilar masses. No evidence of adenopathy. Lungs are clear.  No pleural effusion or pneumothorax. Skeletal structures are grossly intact.  Cardiac Studies:   Echocardiogram: 02/29/2020: LVEF 55-60%, mildly dilated left ventricular cavity, mild LVH, normal diastolic filling pattern, normal left atrial pressure, mildly dilated left atrium, mild MR, trace TR.  Stress Testing: Lexiscan Sestamibi stress test 02/29/2020: Stress EKG at 78% MPHR shows sinus tachycardia, nonspecific T wave inversion in inferior leads, frequent PVC's.  SPECT images show large sized, medium intensity, predominantly reversible perfusion defect in basal to apical, inferoseptal, inferior, and inferolateral myocadium. While study quality may be limited due to attenuation (BMI 52), ischemia cannot be excluded. Stress LVEF 65%. Recommend clinical correlation. High risk study.   Vascular imaging: Ultrasound venous duplex: 03/07/2019: Right: Findings consistent with acute deep vein thrombosis involving the right popliteal vein. There is evidence of superficial vein thrombosis involving a varicose vein of the proximal, medial, right calf.  Left: No evidence of common femoral vein obstruction.  Left Heart Catheterization 04/19/20:  LV: Moderately elevated LVEDP.  LM: Mid 40%. Hazy. Severe diffuse multivessel disease. Ectasia of the LAD, RCA. Prox LAD aneurysmal. Occluded after origin of a very large D1. D1 is very large with mid 90% stenosis. Ipsilateral and contralateral (RCA) to mid LAD noted.  Cx: Prox 70-80% stenosis involves the bifurcation of large OM-1. At the bifurcation there is at least a 80% stenosis. RCA: Diffusely ectatic. PL branch has a focal 50% stenosis.  Slow flow noted in the RCA.   Rec: Complex multivessel CAD and will  benefit from CABG to LAD and D1 and OM1 with arterial conduits. Maze and LAA ligation. Patient went into atrial fibrillation in the cath lab. 70 mL contrast used. IV dilt and metoprolol given but did not convert to sinus. He will resume Xarelto tonight. Patient wants to come in and discuss personally at the office. Not happy about CABG potential.  EKG:   EKG 10/10/2020: Marked sinus bradycardia rate of 49 bpm.  Normal axis.  Poor progression, cannot exclude anteroseptal infarct old.  Nonspecific T wave abnormality.  Low voltage complexes.  Compared to EKG 08/22/2020, no significant change.  Assessment     ICD-10-CM   1. Coronary artery disease involving native coronary artery of native heart with other form of angina pectoris (HCC)  I25.118   2. Paroxysmal atrial fibrillation (HCC). CHA2DS2-VASc Score is 2.  Yearly risk of stroke: 2.3% (CAD, HTN).   I48.0   3. Primary hypertension  I10   4. S/P CABG (coronary artery bypass graft)  Z95.1     CHA2DS2-VASc Score is 2.  Yearly risk of stroke: 2.3% (CAD, HTN).  No orders of the defined types were placed in this encounter.   There are no discontinued medications.  Recommendations:   Joseph E Kriz  is a 45 y.o. Caucasian male patient with past medical history of paroxysmal atrial fibrillation and cardioversion on 01/17/2020 and 07/01/2020, hypertension, morbid obesity, obstructive sleep apnea on CPAP, tobacco use disorder, history of acute deep vein thrombosis and pulmonary embolism on 04/08/2015 following gastric sleeve resection and again presented with spontaneous right leg DVT  on 03/07/2019 presently on long termXarelto.  Due to multivessel coronary disease on 04/19/2020 angiography, he was evaluated by Dr. Elise Benne and underwent CABG x 2 and LAA ligation on 06/27/2020.  Patient presents for 3 month follow up of atrial fibrillation and coronary artery disease. At last visit restarted patient on benazapril and CMP remained stable.  Blood pressure was  initially elevated in the office today, it is well controlled upon recheck.  Patient's primary concern continues to be left arm swelling, for which I again recommended that he continue with custom compression sleeve recommended by Dr. Tyrone Sage.  In regard to atrial fibrillation, he has had no known recurrence since cardioversion on 07/01/2020.  Notably he underwent LAA ligation on 06/27/2020 and has been on oral anticoagulation since, which she is tolerating without bleeding diathesis.  Patient remains on amiodarone, however as he has had no recurrence of atrial fibrillation and has undergone left atrial appendage closure, recommend discontinuation of amiodarone at this time, particularly in view of his young age. Will hold antiarrythmic therapy for 5 weeks, will then repeat EKG, will plan to transition patient to multaq. Will also plan to discontinue anticoagulation as it has been approximately 6 months since LAA closure, He has Chadvasc score of 2, and he has had no recurrence of atrial fibrillation.   Overall patient is doing relatively well from a cardiovascular standpoint.  Blood pressure is well controlled and lipids are well controlled.  Follow-up in 6 months, sooner if needed, for paroxysmal atrial fibrillation coronary artery disease.  Patient was seen in collaboration with Dr. Jacinto Halim and he is in agreement with the plan.    Rayford Halsted, PA-C 01/10/2021, 2:52 PM Office: [phone]

## 2021-01-10 ENCOUNTER — Encounter: Payer: Self-pay | Admitting: Student

## 2021-01-10 ENCOUNTER — Other Ambulatory Visit: Payer: Self-pay

## 2021-01-10 ENCOUNTER — Ambulatory Visit: Payer: 59 | Admitting: Student

## 2021-01-10 VITALS — BP 132/84 | HR 53 | Temp 98.0°F | Resp 16 | Ht 73.0 in | Wt >= 6400 oz

## 2021-01-10 DIAGNOSIS — Z951 Presence of aortocoronary bypass graft: Secondary | ICD-10-CM

## 2021-01-10 DIAGNOSIS — I10 Essential (primary) hypertension: Secondary | ICD-10-CM

## 2021-01-10 DIAGNOSIS — I25118 Atherosclerotic heart disease of native coronary artery with other forms of angina pectoris: Secondary | ICD-10-CM

## 2021-01-10 DIAGNOSIS — I480 Paroxysmal atrial fibrillation: Secondary | ICD-10-CM

## 2021-01-11 ENCOUNTER — Ambulatory Visit (INDEPENDENT_AMBULATORY_CARE_PROVIDER_SITE_OTHER): Payer: 59 | Admitting: Family Medicine

## 2021-01-11 ENCOUNTER — Encounter (INDEPENDENT_AMBULATORY_CARE_PROVIDER_SITE_OTHER): Payer: Self-pay | Admitting: Family Medicine

## 2021-01-11 VITALS — BP 127/73 | HR 58 | Temp 97.8°F | Ht 73.0 in | Wt >= 6400 oz

## 2021-01-11 DIAGNOSIS — I10 Essential (primary) hypertension: Secondary | ICD-10-CM | POA: Diagnosis not present

## 2021-01-11 DIAGNOSIS — Z9189 Other specified personal risk factors, not elsewhere classified: Secondary | ICD-10-CM

## 2021-01-11 DIAGNOSIS — R7303 Prediabetes: Secondary | ICD-10-CM

## 2021-01-11 DIAGNOSIS — E559 Vitamin D deficiency, unspecified: Secondary | ICD-10-CM

## 2021-01-11 DIAGNOSIS — E7849 Other hyperlipidemia: Secondary | ICD-10-CM

## 2021-01-11 DIAGNOSIS — Z6841 Body Mass Index (BMI) 40.0 and over, adult: Secondary | ICD-10-CM

## 2021-01-11 MED ORDER — VITAMIN D (ERGOCALCIFEROL) 1.25 MG (50000 UNIT) PO CAPS: 50000.0000 [IU] | ORAL_CAPSULE | ORAL | 0 refills | Status: DC

## 2021-01-12 ENCOUNTER — Other Ambulatory Visit: Payer: Self-pay | Admitting: Student

## 2021-01-12 DIAGNOSIS — E559 Vitamin D deficiency, unspecified: Secondary | ICD-10-CM | POA: Insufficient documentation

## 2021-01-12 DIAGNOSIS — R7303 Prediabetes: Secondary | ICD-10-CM | POA: Insufficient documentation

## 2021-01-12 DIAGNOSIS — E7849 Other hyperlipidemia: Secondary | ICD-10-CM | POA: Insufficient documentation

## 2021-01-12 DIAGNOSIS — I10 Essential (primary) hypertension: Secondary | ICD-10-CM | POA: Insufficient documentation

## 2021-01-21 NOTE — Progress Notes (Signed)
Chief Complaint:   OBESITY Joseph Lang is here to discuss his progress with his obesity treatment plan along with follow-up of his obesity related diagnoses.   Today's visit was #: 2 Starting weight: 420 lbs Starting date: 12/28/2020 Today's weight: 404 lbs Today's date: 01/11/2021 Total lbs lost to date: 16 lbs Body mass index is 53.3 kg/m.  Total weight loss percentage to date: -3.81%  Interim History:  Joseph Lang is very upset that he has to wear a mask today and cannot wear his own gaiter.     Joseph Lang E Deveney is here today for his first follow-up office visit since starting the program with [place].  We reviewed his NEW Meal Plan and discussed all recent labs done here and/ or done at outside facilities.  Extended time was spent counseling Joseph Lang E Schoneman on all new disease processes that were discovered or that are worsening.    He is following the meal plan with only minor concerns/ questions today.  Patient's meal and food recall appears to be accurate and consistent with what is on the plan when he is following it.  When on plan, his hunger and cravings are well controlled.    He endorses increased hunger.  No cravings.  Eating foods on plan and not skipping breakfast or lunch now.  Current Meal Plan: the Category 3 Plan for 97% of the time.  Current Exercise Plan: None.  Assessment/Plan:   1. Prediabetes Not at goal. Goal is HgbA1c < 5.7.  Medication: None.    Plan:  Discussed labs with patient today.  He will continue to focus on protein-rich, low simple carbohydrate foods. We reviewed the importance of hydration, regular exercise for stress reduction, and restorative sleep.  Extensive counseling done.  Handouts given.  Follow prudent nutritional plan and weight loss.  Lab Results  Component Value Date   HGBA1C 5.9 (H) 12/28/2020   Lab Results  Component Value Date   INSULIN 13.0 12/28/2020   2. Vitamin D deficiency Not at goal. Current vitamin D is 23.0, tested on 12/28/2020.  Optimal goal > 50 ng/dL.  He is not taking a vitamin D supplement.  Plan: New.  Discussed labs with patient today.  Start to take prescription Vitamin D @50 ,000 IU every week as prescribed.  Follow-up for routine testing of Vitamin D, at least 2-3 times per year to avoid over-replacement.  - Start Vitamin D, Ergocalciferol, (DRISDOL) 1.25 MG (50000 UNIT) CAPS capsule; Take 1 capsule (50,000 Units total) by mouth every 7 (seven) days.  Dispense: 4 capsule; Refill: 0  3. Essential hypertension At goal. Medications: amiodarone 100 mg daily, benazepril 10 mg daily, Imdur 15 mg daily, Lopressor 100 mg twice daily.   Plan:  At goal.  Continue medications per Cardiology.  Monitor closely as patient loses weight.  Discussed labs with patient today.  Avoid buying foods that are: processed, frozen, or prepackaged to avoid excess salt. We will continue to monitor closely alongside his PCP and/or Specialist.  Regular follow up with PCP and specialists was also encouraged.   BP Readings from Last 3 Encounters:  01/11/21 127/73  01/10/21 132/84  12/28/20 122/73   Lab Results  Component Value Date   CREATININE 0.83 12/28/2020   4. Other hyperlipidemia Course: Controlled. Lipid-lowering medications: Lipitor 80 mg daily.  Treatment per Cardiology.  Plan: Discussed labs with patient today. Dietary changes: Increase soluble fiber, decrease simple carbohydrates, decrease saturated fat. Exercise changes: Moderate to vigorous-intensity aerobic activity 150 minutes per week  or as tolerated. We will continue to monitor along with PCP/specialists as it pertains to his weight loss journey.  Continue medication per Cardiology.  Follow prudent nutritional plan to decrease saturated/trans fats, eventually increase exercise.  Lab Results  Component Value Date   CHOL 127 12/28/2020   HDL 47 12/28/2020   LDLCALC 65 12/28/2020   TRIG 75 12/28/2020   CHOLHDL 2.7 12/28/2020   Lab Results  Component Value Date   ALT  27 12/28/2020   AST 23 12/28/2020   ALKPHOS 98 12/28/2020   BILITOT 0.9 12/28/2020   5. At risk for diabetes mellitus - Joseph Lang was given diabetes prevention education and counseling today of more than 22 minutes.  - Counseled patient on pathophysiology of disease and meaning/ implication of lab results.  - Reviewed how certain foods can either stimulate or inhibit insulin release, and subsequently affect hunger pathways  - Importance of following a healthy meal plan with limiting amounts of simple carbohydrates discussed with patient - Effects of regular aerobic exercise on blood sugar regulation reviewed and encouraged an eventual goal of 30 min 5d/week or more as a minimum.  - Briefly discussed treatment options, which always include dietary and lifestyle modification as first line.   - Handouts provided at patient's desire and/or told to go online to the American Diabetes Association website for further information.  6. Obesity, current BMI 53.4  Course: Joseph Lang is currently in the action stage of change. As such, his goal is to continue with weight loss efforts.   Nutrition goals: He has agreed to the Category 4 Plan.   Exercise goals: As is.  Behavioral modification strategies: increasing lean protein intake, decreasing simple carbohydrates, increasing water intake, meal planning and cooking strategies, better snacking choices and planning for success.  Joseph Lang has agreed to follow-up with our clinic in 2 weeks. He was informed of the importance of frequent follow-up visits to maximize his success with intensive lifestyle modifications for his multiple health conditions.   Objective:   Blood pressure 127/73, pulse (!) 58, temperature 97.8 F (36.6 C), height 6\' 1"  (1.854 m), weight (!) 404 lb (183.3 kg), SpO2 97 %. Body mass index is 53.3 kg/m.  General: Cooperative, alert, well developed, in no acute distress. HEENT: Conjunctivae and lids unremarkable. Cardiovascular: Regular rhythm.   Lungs: Normal work of breathing. Neurologic: No focal deficits.   Lab Results  Component Value Date   CREATININE 0.83 12/28/2020   BUN 13 12/28/2020   NA 143 12/28/2020   K 4.6 12/28/2020   CL 105 12/28/2020   CO2 24 12/28/2020   Lab Results  Component Value Date   ALT 27 12/28/2020   AST 23 12/28/2020   ALKPHOS 98 12/28/2020   BILITOT 0.9 12/28/2020   Lab Results  Component Value Date   HGBA1C 5.9 (H) 12/28/2020   HGBA1C 5.5 06/23/2020   Lab Results  Component Value Date   INSULIN 13.0 12/28/2020   Lab Results  Component Value Date   TSH 3.000 12/28/2020   Lab Results  Component Value Date   CHOL 127 12/28/2020   HDL 47 12/28/2020   LDLCALC 65 12/28/2020   TRIG 75 12/28/2020   CHOLHDL 2.7 12/28/2020   Lab Results  Component Value Date   WBC 8.7 12/28/2020   HGB 14.1 12/28/2020   HCT 44.0 12/28/2020   MCV 86 12/28/2020   PLT 266 12/28/2020   Attestation Statements:   Reviewed by clinician on day of visit: allergies, medications, problem list, medical  history, surgical history, family history, social history, and previous encounter notes.  I, Insurance claims handler, CMA, am acting as Energy manager for [employer], DO.  I have reviewed the above documentation for accuracy and completeness, and I agree with the above. Carlye Grippe, D.O.  The 21st Century Cures Act was signed into law in 2016 which includes the topic of electronic health records.  This provides immediate access to information in MyChart.  This includes consultation notes, operative notes, office notes, lab results and pathology reports.  If you have any questions about what you read please let us know at your next visit so we can discuss your concerns and take corrective action if need be.  We are right here with you.

## 2021-02-01 ENCOUNTER — Ambulatory Visit (INDEPENDENT_AMBULATORY_CARE_PROVIDER_SITE_OTHER): Payer: 59 | Admitting: Physician Assistant

## 2021-02-10 ENCOUNTER — Other Ambulatory Visit: Payer: Self-pay | Admitting: Student

## 2021-02-16 ENCOUNTER — Other Ambulatory Visit: Payer: Self-pay

## 2021-02-16 ENCOUNTER — Ambulatory Visit: Payer: 59 | Admitting: Cardiology

## 2021-02-16 DIAGNOSIS — I480 Paroxysmal atrial fibrillation: Secondary | ICD-10-CM

## 2021-02-16 DIAGNOSIS — I25118 Atherosclerotic heart disease of native coronary artery with other forms of angina pectoris: Secondary | ICD-10-CM

## 2021-02-16 NOTE — Progress Notes (Signed)
ICD-10-CM   1. Paroxysmal atrial fibrillation (HCC)  I48.0 EKG 12-Lead  2. Coronary artery disease involving native coronary artery of native heart with other form of angina pectoris (HCC)  I25.118    Orders Placed This Encounter  Procedures  . EKG 12-Lead    EKG 02/16/2021: Marked sinus bradycardia at rate of 45 bpm, normal axis.  Poor R wave progression, cannot exclude anteroseptal infarct old.  IVCD, borderline criteria for LVH.  Patient is maintaining sinus rhythm, he is now off of amiodarone.  Office visit 3 months set up and could consider discontinuation of anticoagulation as he has had left atrial appendage ligation.  We could also consider addition of Multitaq if he has recurrence of A. Fib.   Yates Decamp, MD, [facility] 02/16/2021, 8:41 AM Office: [phone] Pager: [phone]

## 2021-02-20 ENCOUNTER — Ambulatory Visit (INDEPENDENT_AMBULATORY_CARE_PROVIDER_SITE_OTHER): Payer: 59 | Admitting: Family Medicine

## 2021-05-23 ENCOUNTER — Ambulatory Visit: Payer: 59 | Admitting: Student

## 2021-06-12 ENCOUNTER — Other Ambulatory Visit: Payer: Self-pay | Admitting: Student

## 2021-06-13 ENCOUNTER — Other Ambulatory Visit: Payer: Self-pay

## 2021-06-13 MED ORDER — BENAZEPRIL HCL 10 MG PO TABS: 10.0000 mg | ORAL_TABLET | Freq: Every day | ORAL | 3 refills | Status: DC

## 2021-06-13 MED ORDER — ISOSORBIDE MONONITRATE ER 30 MG PO TB24: 15.0000 mg | ORAL_TABLET | Freq: Every day | ORAL | 4 refills | Status: DC

## 2021-07-18 ENCOUNTER — Other Ambulatory Visit: Payer: Self-pay | Admitting: Cardiology

## 2021-07-18 DIAGNOSIS — I480 Paroxysmal atrial fibrillation: Secondary | ICD-10-CM

## 2021-07-18 DIAGNOSIS — I25118 Atherosclerotic heart disease of native coronary artery with other forms of angina pectoris: Secondary | ICD-10-CM

## 2021-07-25 ENCOUNTER — Other Ambulatory Visit: Payer: Self-pay | Admitting: Student

## 2021-07-25 DIAGNOSIS — I25118 Atherosclerotic heart disease of native coronary artery with other forms of angina pectoris: Secondary | ICD-10-CM

## 2021-07-25 DIAGNOSIS — E7800 Pure hypercholesterolemia, unspecified: Secondary | ICD-10-CM

## 2021-09-20 ENCOUNTER — Other Ambulatory Visit: Payer: Self-pay | Admitting: Cardiology

## 2021-09-20 DIAGNOSIS — E7800 Pure hypercholesterolemia, unspecified: Secondary | ICD-10-CM

## 2021-09-20 DIAGNOSIS — I25118 Atherosclerotic heart disease of native coronary artery with other forms of angina pectoris: Secondary | ICD-10-CM

## 2021-10-11 ENCOUNTER — Other Ambulatory Visit: Payer: Self-pay | Admitting: Student

## 2021-11-14 ENCOUNTER — Other Ambulatory Visit: Payer: Self-pay | Admitting: Student

## 2021-11-14 ENCOUNTER — Other Ambulatory Visit: Payer: Self-pay | Admitting: Cardiology

## 2021-11-14 DIAGNOSIS — I480 Paroxysmal atrial fibrillation: Secondary | ICD-10-CM

## 2021-11-14 DIAGNOSIS — I25118 Atherosclerotic heart disease of native coronary artery with other forms of angina pectoris: Secondary | ICD-10-CM

## 2021-11-14 MED ORDER — BENAZEPRIL HCL 10 MG PO TABS: 10.0000 mg | ORAL_TABLET | Freq: Every day | ORAL | 3 refills | Status: DC

## 2021-11-14 MED ORDER — METOPROLOL TARTRATE 100 MG PO TABS: 100.0000 mg | ORAL_TABLET | Freq: Two times a day (BID) | ORAL | 3 refills | Status: DC

## 2021-11-20 ENCOUNTER — Other Ambulatory Visit: Payer: Self-pay | Admitting: Cardiology

## 2021-11-20 DIAGNOSIS — I25118 Atherosclerotic heart disease of native coronary artery with other forms of angina pectoris: Secondary | ICD-10-CM

## 2021-11-20 DIAGNOSIS — E7800 Pure hypercholesterolemia, unspecified: Secondary | ICD-10-CM

## 2021-12-08 ENCOUNTER — Emergency Department (HOSPITAL_COMMUNITY)
Admission: EM | Admit: 2021-12-08 | Discharge: 2021-12-08 | Disposition: A | Discharge status: Home / Self Care | Payer: Self-pay | Attending: Emergency Medicine | Admitting: Emergency Medicine

## 2021-12-08 DIAGNOSIS — S161XXA Strain of muscle, fascia and tendon at neck level, initial encounter: Secondary | ICD-10-CM

## 2021-12-08 DIAGNOSIS — Z7982 Long term (current) use of aspirin: Secondary | ICD-10-CM | POA: Insufficient documentation

## 2021-12-08 DIAGNOSIS — M542 Cervicalgia: Secondary | ICD-10-CM | POA: Insufficient documentation

## 2021-12-08 DIAGNOSIS — Z7901 Long term (current) use of anticoagulants: Secondary | ICD-10-CM | POA: Insufficient documentation

## 2021-12-08 MED ORDER — PREDNISONE 50 MG PO TABS: ORAL_TABLET | ORAL | 0 refills | Status: DC

## 2021-12-08 MED ORDER — KETOROLAC TROMETHAMINE 60 MG/2ML IM SOLN
60.0000 mg | Freq: Once | INTRAMUSCULAR | Status: AC
  Administered 2021-12-08: 60 mg via INTRAMUSCULAR
  Filled 2021-12-08: qty 2

## 2021-12-08 MED ORDER — LIDOCAINE 5 % EX PTCH: 1.0000 | MEDICATED_PATCH | CUTANEOUS | 0 refills | Status: DC

## 2021-12-08 MED ORDER — OXYCODONE-ACETAMINOPHEN 5-325 MG PO TABS
1.0000 | ORAL_TABLET | Freq: Once | ORAL | Status: AC
  Administered 2021-12-08: 1 via ORAL
  Filled 2021-12-08: qty 1

## 2021-12-08 MED ORDER — METHOCARBAMOL 750 MG PO TABS: 750.0000 mg | ORAL_TABLET | Freq: Four times a day (QID) | ORAL | 0 refills | Status: DC

## 2021-12-08 MED ORDER — DIAZEPAM 5 MG PO TABS
5.0000 mg | ORAL_TABLET | Freq: Once | ORAL | Status: AC
  Administered 2021-12-08: 5 mg via ORAL
  Filled 2021-12-08: qty 1

## 2021-12-08 NOTE — ED Notes (Signed)
Pt states he's not driving and his mom is coming to get him

## 2021-12-08 NOTE — ED Provider Triage Note (Signed)
Emergency Medicine Provider Triage Evaluation Note  Joseph Lang , a 46 y.o. male  was evaluated in triage.  Pt complains of neck pain.  Has been present over the last 5 days.  Pain is located to the left side of his neck and radiates to his occipital region.  Patient has been taking muscle relaxer and oxycodone with no improvement in his pain.  Patient reports that pain is worse with movement and swallowing.  Review of Systems  Positive: Neck pain Negative: Fever, chills, numbness, weakness, visual disturbance, trouble swallowing, trismus, drooling, lightheadedness, dizziness, syncope, chest pain  Physical Exam  BP 135/72 (BP Location: Right Arm)    Pulse (!) 55    Temp 98.1 F (36.7 C) (Oral)    Resp 18    SpO2 95%  Gen:   Awake, no distress   Resp:  Normal effort  MSK:   Moves extremities without difficulty  Other:  Neck is supple.  Patient has decreased range of motion to cervical neck secondary to complaints of pain.  Medical Decision Making  Medically screening exam initiated at 3:57 PM.  Appropriate orders placed.  Joseph Lang was informed that the remainder of the evaluation will be completed by another provider, this initial triage assessment does not replace that evaluation, and the importance of remaining in the ED until their evaluation is complete.     Loni Beckwith, [place] 12/08/21 1558

## 2021-12-08 NOTE — ED Provider Notes (Signed)
[name] EMERGENCY DEPARTMENT Provider Note   CSN: [id number] Arrival date & time: 12/08/21  1547     History  Chief Complaint  Patient presents with   Neck Injury    Joseph Lang is a 46 y.o. male.  46 year old male presents with neck pain times several days.  Pain is atraumatic and starts in his occiput and radiates up into his head as well as goes down to 5 out of shoulders.  States it is sharp and worse with movement.  Denies any weakness in his arms or legs.  Has been using over-the-counter medications without relief      Home Medications Prior to Admission medications   Medication Sig Start Date End Date Taking? Authorizing Provider  amiodarone (PACERONE) 200 MG tablet Take 0.5 tablets (100 mg total) by mouth daily. 01/03/21   Yates Decamp, MD  aspirin EC 81 MG tablet Take 1 tablet (81 mg total) by mouth daily. 04/23/20   Yates Decamp, MD  atorvastatin (LIPITOR) 80 MG tablet TAKE 1 TABLET BY MOUTH DAILY 11/20/21   Yates Decamp, MD  benazepril (LOTENSIN) 10 MG tablet Take 1 tablet (10 mg total) by mouth daily. 11/14/21   Cantwell, Celeste C, PA-C  isosorbide mononitrate (IMDUR) 30 MG 24 hr tablet Take 0.5 tablets (15 mg total) by mouth daily. Please take until finished 06/13/21   Cantwell, Celeste C, PA-C  metoprolol tartrate (LOPRESSOR) 100 MG tablet Take 1 tablet (100 mg total) by mouth 2 (two) times daily. 11/14/21   Cantwell, Celeste C, PA-C  RABEprazole (ACIPHEX) 20 MG tablet Take 20 mg by mouth daily.  01/04/20   [provider]  Vitamin D, Ergocalciferol, (DRISDOL) 1.25 MG (50000 UNIT) CAPS capsule Take 1 capsule (50,000 Units total) by mouth every 7 (seven) days. 01/11/21   Opalski, Deborah, DO  XARELTO 20 MG TABS tablet Take 20 mg by mouth daily. 01/06/20   [provider]      Allergies    Amoxicillin    Review of Systems   Review of Systems  All other systems reviewed and are negative.  Physical Exam Updated Vital Signs BP 135/72  (BP Location: Right Arm)    Pulse (!) 55    Temp 98.1 F (36.7 C) (Oral)    Resp 18    SpO2 95%  Physical Exam Vitals and nursing note reviewed.  Constitutional:      General: He is not in acute distress.    Appearance: Normal appearance. He is well-developed. He is not toxic-appearing.  HENT:     Head: Normocephalic and atraumatic.  Eyes:     General: Lids are normal.     Conjunctiva/sclera: Conjunctivae normal.     Pupils: Pupils are equal, round, and reactive to light.  Neck:     Thyroid: No thyroid mass.     Trachea: No tracheal deviation.   Cardiovascular:     Rate and Rhythm: Normal rate and regular rhythm.     Heart sounds: Normal heart sounds. No murmur heard.   No gallop.  Pulmonary:     Effort: Pulmonary effort is normal. No respiratory distress.     Breath sounds: Normal breath sounds. No stridor. No decreased breath sounds, wheezing, rhonchi or rales.  Abdominal:     General: There is no distension.     Palpations: Abdomen is soft.     Tenderness: There is no abdominal tenderness. There is no rebound.  Musculoskeletal:        General: No  tenderness. Normal range of motion.     Cervical back: Normal range of motion and neck supple.  Skin:    General: Skin is warm and dry.     Findings: No abrasion or rash.  Neurological:     Mental Status: He is alert and oriented to person, place, and time. Mental status is at baseline.     GCS: GCS eye subscore is 4. GCS verbal subscore is 5. GCS motor subscore is 6.     Cranial Nerves: Cranial nerves 2-12 are intact. No cranial nerve deficit.     Sensory: No sensory deficit.     Motor: Motor function is intact.  Psychiatric:        Attention and Perception: Attention normal.        Speech: Speech normal.        Behavior: Behavior normal.    ED Results / Procedures / Treatments   Labs (all labs ordered are listed, but only abnormal results are displayed) Labs Reviewed - No data to display  EKG None  Radiology No  results found.  Procedures Procedures    Medications Ordered in ED Medications  ketorolac (TORADOL) injection 60 mg (has no administration in time range)  diazepam (VALIUM) tablet 5 mg (has no administration in time range)  oxyCODONE-acetaminophen (PERCOCET/ROXICET) 5-325 MG per tablet 1 tablet (has no administration in time range)    ED Course/ Medical Decision Making/ A&P                           Medical Decision Making Risk Prescription drug management.   Patient presented with atraumatic neck pain times several days.  He has no focal neurological deficit on exam.  Consider possible nerve impingement from cervical spine.  Wanted to order a CT of cervical spine but shared decision making done with patient and he would rather not have it at this time.  He would rather be treated with medications and return for his imaging if needed.  This was based on his financial considerations.  Patient medicated with IM Toradol as well as oral Percocet and Valium.  reTurn precautions given       Final Clinical Impression(s) / ED Diagnoses Final diagnoses:  None    Rx / DC Orders ED Discharge Orders     None         Lorre Nick, MD 12/08/21 [phone]

## 2021-12-08 NOTE — ED Notes (Signed)
Pt denies numbness and tingling. Pt able to move all extremities and walk

## 2021-12-08 NOTE — ED Triage Notes (Signed)
Pt here via EMS from home d/t neck pain radiating into skull base X5 days. Pt taking medications per PCP without relief. 10/10 pain. Cardiac history.

## 2022-01-26 ENCOUNTER — Other Ambulatory Visit: Payer: Self-pay | Admitting: Cardiology

## 2022-01-26 DIAGNOSIS — E7800 Pure hypercholesterolemia, unspecified: Secondary | ICD-10-CM

## 2022-01-26 DIAGNOSIS — I25118 Atherosclerotic heart disease of native coronary artery with other forms of angina pectoris: Secondary | ICD-10-CM

## 2022-01-28 ENCOUNTER — Other Ambulatory Visit: Payer: Self-pay

## 2022-01-28 ENCOUNTER — Emergency Department (HOSPITAL_BASED_OUTPATIENT_CLINIC_OR_DEPARTMENT_OTHER)
Admission: EM | Admit: 2022-01-28 | Discharge: 2022-01-28 | Disposition: A | Discharge status: Home / Self Care | Payer: Self-pay | Attending: Emergency Medicine | Admitting: Emergency Medicine

## 2022-01-28 ENCOUNTER — Encounter (HOSPITAL_BASED_OUTPATIENT_CLINIC_OR_DEPARTMENT_OTHER): Payer: Self-pay | Admitting: Emergency Medicine

## 2022-01-28 ENCOUNTER — Emergency Department (HOSPITAL_BASED_OUTPATIENT_CLINIC_OR_DEPARTMENT_OTHER): Payer: Self-pay

## 2022-01-28 DIAGNOSIS — Z7901 Long term (current) use of anticoagulants: Secondary | ICD-10-CM | POA: Insufficient documentation

## 2022-01-28 DIAGNOSIS — Z951 Presence of aortocoronary bypass graft: Secondary | ICD-10-CM | POA: Insufficient documentation

## 2022-01-28 DIAGNOSIS — I10 Essential (primary) hypertension: Secondary | ICD-10-CM | POA: Insufficient documentation

## 2022-01-28 DIAGNOSIS — Z7982 Long term (current) use of aspirin: Secondary | ICD-10-CM | POA: Insufficient documentation

## 2022-01-28 DIAGNOSIS — M7989 Other specified soft tissue disorders: Secondary | ICD-10-CM | POA: Insufficient documentation

## 2022-01-28 DIAGNOSIS — R001 Bradycardia, unspecified: Secondary | ICD-10-CM | POA: Insufficient documentation

## 2022-01-28 DIAGNOSIS — Z79899 Other long term (current) drug therapy: Secondary | ICD-10-CM | POA: Insufficient documentation

## 2022-01-28 DIAGNOSIS — I2510 Atherosclerotic heart disease of native coronary artery without angina pectoris: Secondary | ICD-10-CM | POA: Insufficient documentation

## 2022-01-28 NOTE — Discharge Instructions (Addendum)
You came to the emergency department today to be evaluated for your leg swelling.  The ultrasound of your lower leg did not show any signs of a blood clot.  Your physical exam was not consistent with a cellulitis skin infection.  You were offered further imaging and testing to evaluate for other causes of your swelling however declined this at this time.  Please follow-up closely with your primary care doctor for further evaluation. ? ?While in the emergency department your heart rate was found to be slow.  Please hold your metoprolol medication tonight and tomorrow morning.  Call your cardiologist first thing tomorrow morning to discuss your slow heart rate. ? ?Get help right away if: ?You develop a skin rash. ?You have chills or sweating. ?You have trouble breathing. ?You faint. ?You have chest pains or an irregular heartbeat (palpitations). ? ?

## 2022-01-28 NOTE — ED Triage Notes (Signed)
Pt c/o right leg pain and swelling onset yesterday. Pt denies injury. Pt is currently taking Xerelto. Pt denies shortness of breath or chest pain. ?

## 2022-01-28 NOTE — ED Provider Notes (Signed)
?[facility] EMERGENCY DEPT ?Provider Note ? ? ?CSN: [id number] ?Arrival date & time: 01/28/22  1236 ? ?  ? ?History ? ?Chief Complaint  ?Patient presents with  ? Leg Swelling  ? ? ?Joseph Lang is a 46 y.o. male with pertinent history of morbid obesity, hypertension, PE, DVT of right lower extremity (on 20 mg Xarelto), CAD status post CABG September 2021, paroxysmal atrial fibrillation.   Presents emergency department for complaint of swelling to right lower extremity.  Patient states that swelling started yesterday.  Swelling has remained constant since then.  Patient denies any falls or traumatic injuries.  Denies any associated pain, color change, or wounds to affected limb.  Patient has been taking his Xarelto medication as prescribed without any missed doses. ? ?Patient denies any chest pain, shortness of breath, hemoptysis, numbness, weakness, fevers, chills. ? ?HPI ? ?  ? ?Home Medications ?Prior to Admission medications   ?Medication Sig Start Date End Date Taking? Authorizing Provider  ?amiodarone (PACERONE) 200 MG tablet Take 0.5 tablets (100 mg total) by mouth daily. 01/03/21   Yates Decamp, MD  ?aspirin EC 81 MG tablet Take 1 tablet (81 mg total) by mouth daily. 04/23/20   Yates Decamp, MD  ?atorvastatin (LIPITOR) 80 MG tablet TAKE 1 TABLET BY MOUTH DAILY 11/20/21   Yates Decamp, MD  ?benazepril (LOTENSIN) 10 MG tablet Take 1 tablet (10 mg total) by mouth daily. 11/14/21   Cantwell, Celeste C, PA-C  ?isosorbide mononitrate (IMDUR) 30 MG 24 hr tablet Take 0.5 tablets (15 mg total) by mouth daily. Please take until finished 06/13/21   Cantwell, Celeste C, PA-C  ?lidocaine (LIDODERM) 5 % Place 1 patch onto the skin daily. Remove & Discard patch within 12 hours or as directed by MD 12/08/21   Lorre Nick, MD  ?methocarbamol (ROBAXIN-750) 750 MG tablet Take 1 tablet (750 mg total) by mouth 4 (four) times daily. 12/08/21   Lorre Nick, MD  ?metoprolol tartrate (LOPRESSOR) 100 MG tablet Take 1 tablet (100  mg total) by mouth 2 (two) times daily. 11/14/21   Cantwell, Celeste C, PA-C  ?predniSONE (DELTASONE) 50 MG tablet 1 p.o. daily x5 12/08/21   Lorre Nick, MD  ?RABEprazole (ACIPHEX) 20 MG tablet Take 20 mg by mouth daily.  01/04/20   [provider]  ?Vitamin D, Ergocalciferol, (DRISDOL) 1.25 MG (50000 UNIT) CAPS capsule Take 1 capsule (50,000 Units total) by mouth every 7 (seven) days. 01/11/21   Thomasene Lot, DO  ?XARELTO 20 MG TABS tablet Take 20 mg by mouth daily. 01/06/20   [provider]  ?   ? ?Allergies    ?Amoxicillin   ? ?Review of Systems   ?Review of Systems  ?Constitutional:  Negative for chills and fever.  ?Respiratory:  Negative for shortness of breath.   ?Cardiovascular:  Positive for leg swelling. Negative for chest pain and palpitations.  ?Gastrointestinal:  Negative for abdominal pain, nausea and vomiting.  ?Musculoskeletal:  Negative for back pain and neck pain.  ?Skin:  Negative for color change, pallor, rash and wound.  ?Neurological:  Negative for dizziness, syncope, weakness, light-headedness, numbness and headaches.  ?Psychiatric/Behavioral:  Negative for confusion.   ? ?Physical Exam ?Updated Vital Signs ?BP 129/63 (BP Location: Right Wrist)   Pulse (!) 50   Temp 98 ?F (36.7 ?C) (Oral)   Resp 14   Ht 6\' 2"  (1.88 m)   Wt (!) 195 kg   SpO2 97%   BMI 55.21 kg/m?  ?Physical Exam ?Vitals and  nursing note reviewed.  ?Constitutional:   ?   General: He is not in acute distress. ?   Appearance: He is morbidly obese. He is not ill-appearing, toxic-appearing or diaphoretic.  ?HENT:  ?   Head: Normocephalic.  ?Eyes:  ?   General: No scleral icterus.    ?   Right eye: No discharge.     ?   Left eye: No discharge.  ?Cardiovascular:  ?   Rate and Rhythm: Normal rate.  ?   Pulses:     ?     Radial pulses are 2+ on the right side and 2+ on the left side.  ?     Dorsalis pedis pulses are 1+ on the right side and 2+ on the left side.  ?Pulmonary:  ?   Effort: Pulmonary effort is  normal. No tachypnea, bradypnea or respiratory distress.  ?   Breath sounds: Normal breath sounds. No stridor.  ?Musculoskeletal:  ?   Right upper leg: Normal.  ?   Left upper leg: Normal.  ?   Right knee: No swelling, deformity, effusion, erythema, ecchymosis, lacerations, bony tenderness or crepitus. Normal range of motion. No tenderness. Normal alignment.  ?   Left knee: No swelling, deformity, effusion, erythema, ecchymosis, lacerations, bony tenderness or crepitus. Normal range of motion. No tenderness. Normal alignment.  ?   Right lower leg: Swelling present. No deformity, lacerations, tenderness or bony tenderness. No edema.  ?   Left lower leg: No swelling, deformity, lacerations, tenderness or bony tenderness. No edema.  ?   Right ankle: No swelling, deformity, ecchymosis or lacerations. No tenderness. Normal range of motion.  ?   Left ankle: No swelling, deformity, ecchymosis or lacerations. No tenderness. Normal range of motion.  ?   Right foot: Normal range of motion and normal capillary refill. No swelling, deformity, laceration, tenderness, bony tenderness or crepitus. Abnormal pulse.  ?   Left foot: Normal range of motion and normal capillary refill. No swelling, deformity, laceration, tenderness, bony tenderness or crepitus. Normal pulse.  ?   Comments: +1 right DP pulse, no pallor, cyanosis, or change in temperature noted to right foot.  Swelling noted to right lower extremity without any tenderness, erythema, warmth, rash, or wound  ?Skin: ?   General: Skin is warm and dry.  ?Neurological:  ?   General: No focal deficit present.  ?   Mental Status: He is alert.  ?Psychiatric:     ?   Behavior: Behavior is cooperative.  ? ? ?ED Results / Procedures / Treatments   ?Labs ?(all labs ordered are listed, but only abnormal results are displayed) ?Labs Reviewed - No data to display ? ?EKG ?EKG Interpretation ? ?Date/Time:  Sunday January 28 2022 15:00:31 EDT ?Ventricular Rate:  44 ?PR Interval:  173 ?QRS  Duration: 87 ?QT Interval:  424 ?QTC Calculation: 363 ?R Axis:   92 ?Text Interpretation: Sinus bradycardia Borderline right axis deviation Low voltage, precordial leads when compared to prior, slowe rare. No STEMI Confirmed by Theda Belfast ([id number]) on 01/28/2022 3:17:41 PM ? ?Radiology ?US Venous Img Lower Unilateral Right ? ?Result Date: 01/28/2022 ?CLINICAL DATA:  swelling calf, concern dvt EXAM: RIGHT LOWER EXTREMITY VENOUS DOPPLER ULTRASOUND TECHNIQUE: Gray-scale sonography with graded compression, as well as color Doppler and duplex ultrasound were performed to evaluate the lower extremity deep venous systems from the level of the common femoral vein and including the common femoral, femoral, profunda femoral, popliteal and calf veins including the posterior tibial, peroneal and  gastrocnemius veins when visible. The superficial great saphenous vein was also interrogated. Spectral Doppler was utilized to evaluate flow at rest and with distal augmentation maneuvers in the common femoral, femoral and popliteal veins. COMPARISON:  None. FINDINGS: Contralateral Common Femoral Vein: Respiratory phasicity is normal and symmetric with the symptomatic side. No evidence of thrombus. Normal compressibility. Common Femoral Vein: No evidence of thrombus. Normal compressibility, respiratory phasicity and response to augmentation. Saphenofemoral Junction: No evidence of thrombus. Normal compressibility and flow on color Doppler imaging. Profunda Femoral Vein: No evidence of thrombus. Normal compressibility and flow on color Doppler imaging. Femoral Vein: No evidence of thrombus proximally. The mid to distal femoral vein is poorly visualized due to body habitus, but there is no evidence of thrombus. Popliteal Vein: No evidence of thrombus. Normal compressibility, respiratory phasicity and response to augmentation. Calf Veins: Poorly visualized posterior tibial, but there is no evidence of thrombus. The peroneal vein is not  visualized. Superficial Great Saphenous Vein: No evidence of thrombus. Normal compressibility. Venous Reflux:  None. Other Findings: Limited exam due to body habitus. There is superficial soft tissue swelling of th

## 2022-01-29 ENCOUNTER — Other Ambulatory Visit: Payer: Self-pay

## 2022-01-29 DIAGNOSIS — E7800 Pure hypercholesterolemia, unspecified: Secondary | ICD-10-CM

## 2022-01-29 DIAGNOSIS — I25118 Atherosclerotic heart disease of native coronary artery with other forms of angina pectoris: Secondary | ICD-10-CM

## 2022-01-29 MED ORDER — ATORVASTATIN CALCIUM 80 MG PO TABS: 80.0000 mg | ORAL_TABLET | Freq: Every day | ORAL | 3 refills | Status: DC

## 2022-01-29 NOTE — Telephone Encounter (Signed)
Pt called to inform us that he went to the ER yesterday due to his RT leg being swollen. Pt mention they checked to see if he hd a blood clot. Pt mention The ER told him to call to see how he should take his Metoprolol due that his heart rate was low at the ER. Pt mention that his heart rate was low 40 high 50. Informed pt he would need an appt. Pt stated that he does not have insurance. Please advise.  ?

## 2022-01-29 NOTE — Telephone Encounter (Signed)
Patient can reduce Lopressor to 50 mg BID. Please update his med list accordingly. Please advise patient I probably recommend scheduling a follow-up appointment with Dr. Jacinto Halim as recommended at his last visit.  Patient was supposed to be seen in August 2022

## 2022-01-29 NOTE — Telephone Encounter (Signed)
pt was trqansferred to the front to make an appt

## 2022-01-30 ENCOUNTER — Ambulatory Visit: Payer: Self-pay | Admitting: Cardiology

## 2022-02-08 ENCOUNTER — Ambulatory Visit: Payer: Self-pay | Admitting: Cardiology

## 2022-02-19 ENCOUNTER — Ambulatory Visit: Payer: Self-pay | Admitting: Cardiology

## 2022-02-22 ENCOUNTER — Ambulatory Visit: Payer: Self-pay | Admitting: Cardiology

## 2022-02-22 ENCOUNTER — Encounter: Payer: Self-pay | Admitting: Cardiology

## 2022-02-22 VITALS — BP 148/92 | HR 80 | Temp 98.0°F | Resp 17 | Ht 74.0 in | Wt >= 6400 oz

## 2022-02-22 DIAGNOSIS — F419 Anxiety disorder, unspecified: Secondary | ICD-10-CM

## 2022-02-22 DIAGNOSIS — D6859 Other primary thrombophilia: Secondary | ICD-10-CM

## 2022-02-22 DIAGNOSIS — E559 Vitamin D deficiency, unspecified: Secondary | ICD-10-CM

## 2022-02-22 DIAGNOSIS — I25118 Atherosclerotic heart disease of native coronary artery with other forms of angina pectoris: Secondary | ICD-10-CM

## 2022-02-22 DIAGNOSIS — I10 Essential (primary) hypertension: Secondary | ICD-10-CM

## 2022-02-22 MED ORDER — XARELTO 20 MG PO TABS: 20.0000 mg | ORAL_TABLET | Freq: Every day | ORAL | Status: DC

## 2022-02-22 MED ORDER — DULOXETINE HCL 30 MG PO CPEP: 30.0000 mg | ORAL_CAPSULE | Freq: Every day | ORAL | 2 refills | Status: DC

## 2022-02-22 NOTE — Progress Notes (Signed)
? ?Primary Physician/Referring:  Georgann HousekeeperHusain, Karrar, MD ? ?Patient ID: Joseph Lang, male    DOB: June 04, 1976, 46 y.o.   MRN: [id number]        DOS: 02/22/22 ? ?Chief Complaint  ?Patient presents with  ? Follow-up  ? Leg Swelling  ? ?HPI:   ? ?Joseph Lang  is a 46 y.o. Caucasian  male patient with past medical history of paroxysmal atrial fibrillation and cardioversion on 01/17/2020 and 07/01/2020, hypertension, morbid obesity, obstructive sleep apnea on CPAP, tobacco use disorder, history of acute deep vein thrombosis and pulmonary embolism on 04/08/2015 following gastric sleeve resection and again presented with spontaneous right leg DVT on 03/07/2019 presently on long term Xarelto.  Due to multivessel coronary disease on 04/19/2020 angiography, he was evaluated by Dr. Elise BenneEd Gerhart and underwent CABG x 2 and LAA ligation on 06/27/2020. ? ?Presently on amiodarone 100 mg daily, unable to wean him off as he feels palpitations.  He was seen in the emergency room a week ago for right leg edema, negative for DVT and discharged home.  He now presents to discuss this with me in the office.  States that he has quit his job and feels extremely anxious and has been having "panic attacks" since CABG. ? ?Past Medical History:  ?Diagnosis Date  ? Coronary artery disease 2021  ? Dysrhythmia 2021  ? Afib  ? GERD (gastroesophageal reflux disease)   ? History of blood clots   ? Hx of deep vein thrombophlebitis of lower extremity   ? Hx of gastric bypass   ? sleeve  ? Hx of pulmonary embolus   ? Hypertension   ? Obesity   ? Other hyperlipidemia   ? PONV (postoperative nausea and vomiting)   ? Throwing up after wt loss surgery  ? S/P CABG x 2 06/28/2020: LIMA to LAD, Free Radial graft to D1. LAA ligation with 40 mm Atricure. 06/27/2020  ? Sleep apnea   ? Swelling of both lower extremities   ? Tobacco abuse   ? ?Social History  ? ?Tobacco Use  ? Smoking status: Every Day  ?  Packs/day: 0.50  ?  Years: 30.00  ?  Pack years: 15.00  ?  Types:  Cigarettes  ?  Last attempt to quit: 04/20/2020  ?  Years since quitting: 1.8  ? Smokeless tobacco: Never  ? Tobacco comments:  ?  half a pack daily  ?Substance Use Topics  ? Alcohol use: Not Currently  ? ?Marital Status: Single  ?ROS  ?Review of Systems  ?Cardiovascular:  Negative for chest pain, dyspnea on exertion and leg swelling.  ?Objective  ?Blood pressure (!) 148/92, pulse 80, temperature 98 ?F (36.7 ?C), temperature source Temporal, resp. rate 17, height 6\' 2"  (1.88 m), weight (!) 437 lb 6.4 oz (198.4 kg), SpO2 96 %.  ? ?  02/22/2022  ?  3:13 PM 01/28/2022  ?  3:30 PM 01/28/2022  ?  3:00 PM  ?Vitals with BMI  ?Height 6\' 2"     ?Weight 437 lbs 6 oz    ?BMI 56.13    ?Systolic 148 118 [id number]  ?Diastolic 92 69 70  ?Pulse 80 43 42  ?  ? Physical Exam ?Vitals reviewed.  ?Constitutional:   ?   Comments: He is well-built and morbidly obese in no acute distress.  ?Cardiovascular:  ?   Rate and Rhythm: Regular rhythm. Bradycardia present.  ?   Pulses: Normal pulses and intact distal pulses.  ?   Heart sounds:  S1 normal and S2 normal. No murmur heard. ?  No gallop. No S3 or S4 sounds.  ?   Comments: No JVD. ?Chronic venous stasis pigmentation of bilateral lower extremity. ?Pulmonary:  ?   Effort: Pulmonary effort is normal. No respiratory distress.  ?   Breath sounds: Normal breath sounds. No wheezing, rhonchi or rales.  ?Chest:  ?   Comments: Well-healed sternotomy scar ?Abdominal:  ?   General: Bowel sounds are normal.  ?Musculoskeletal:     ?   General: Swelling (Left arm) present.  ?   Right lower leg: Edema (minimal) present.  ?   Left lower leg: Edema (minimal) present.  ?Skin: ?   General: Skin is warm and dry.  ?   Comments: Left forearm arterial harvest scar noted. No discharge, warmth, or erythema.  ? ?Laboratory examination:  ? ?No results for input(s): NA, K, CL, CO2, GLUCOSE, BUN, CREATININE, CALCIUM, GFRNONAA, GFRAA in the last 8760 hours. ? ?CrCl cannot be calculated (Patient's most recent lab result is older  than the maximum 21 days allowed.).  ? ?  Latest Ref Rng & Units 12/28/2020  ?  9:09 AM 07/02/2020  ?  6:30 AM 07/01/2020  ?  3:00 AM  ?CMP  ?Glucose 65 - 99 mg/dL 90   952   841    ?BUN 6 - 24 mg/dL 13   9   6     ?Creatinine 0.76 - 1.27 mg/dL   3.24   4.01    ?Sodium 134 - 144 mmol/L 143   140   140    ?Potassium 3.5 - 5.2 mmol/L 4.6   3.7   3.2    ?Chloride 96 - 106 mmol/L 105   105   106    ?CO2 20 - 29 mmol/L 24   26   26     ?Calcium 8.7 - 10.2 mg/dL 9.8   9.8   9.2    ?Total Protein 6.0 - 8.5 g/dL 6.3      ?Total Bilirubin 0.0 - 1.2 mg/dL 0.9      ?Alkaline Phos 44 - 121 IU/L 98      ?AST 0 - 40 IU/L 23      ?ALT 0 - 44 IU/L 27      ? ? ?  Latest Ref Rng & Units 12/28/2020  ?  9:09 AM 06/30/2020  ?  5:00 AM 06/29/2020  ?  4:00 AM  ?CBC  ?WBC 3.4 - 10.8 x10E3/uL 8.7   13.7   17.2    ?Hemoglobin 13.0 - 17.7 g/dL 07/02/2020   07/01/2020   25.3    ?Hematocrit 37.5 - 51.0 % 44.0   40.0   39.9    ?Platelets 150 - 450 x10E3/uL 266   239   222    ? ?Lipid Panel ?No results for input(s): CHOL, TRIG, LDLCALC, VLDL, HDL, CHOLHDL, LDLDIRECT in the last 8760 hours. ? ? ?HEMOGLOBIN A1C ?Lab Results  ?Component Value Date  ? HGBA1C 5.9 (H) 12/28/2020  ? MPG 111.15 06/23/2020  ? ?TSH ?No results for input(s): TSH in the last 8760 hours. ? ?External labs:  ? ?Cholesterol, total 230.000 11/03/2019 ?HDL 42.000 11/03/2019 ?LDL-C 158.000 11/03/2019 ?Triglycerides 164.000 11/03/2019 ? ?A1C 5.500 11/03/2019 ?TSH 1.564 01/17/2020 ? ?Medications and allergies  ? ?Allergies  ?Allergen Reactions  ? Amoxicillin Nausea And Vomiting  ?  ? ?Current Outpatient Medications:  ?  amiodarone (PACERONE) 200 MG tablet, Take 0.5 tablets (100 mg total) by mouth daily.,  Disp: 7 tablet, Rfl: 0 ?  aspirin EC 81 MG tablet, Take 1 tablet (81 mg total) by mouth daily., Disp: 90 tablet, Rfl: 3 ?  atorvastatin (LIPITOR) 80 MG tablet, Take 1 tablet (80 mg total) by mouth daily., Disp: 30 tablet, Rfl: 3 ?  benazepril (LOTENSIN) 10 MG tablet, Take 1 tablet (10 mg total) by mouth  daily., Disp: 90 tablet, Rfl: 3 ?  DULoxetine (CYMBALTA) 30 MG capsule, Take 1 capsule (30 mg total) by mouth daily., Disp: 30 capsule, Rfl: 2 ?  isosorbide mononitrate (IMDUR) 30 MG 24 hr tablet, Take 0.5 tablets (15 mg total) by mouth daily. Please take until finished, Disp: 30 tablet, Rfl: 4 ?  methocarbamol (ROBAXIN-750) 750 MG tablet, Take 1 tablet (750 mg total) by mouth 4 (four) times daily., Disp: 30 tablet, Rfl: 0 ?  metoprolol tartrate (LOPRESSOR) 100 MG tablet, Take 1 tablet (100 mg total) by mouth 2 (two) times daily., Disp: 180 tablet, Rfl: 3 ?  RABEprazole (ACIPHEX) 20 MG tablet, Take 20 mg by mouth daily. , Disp: , Rfl:  ?  XARELTO 20 MG TABS tablet, Take 1 tablet (20 mg total) by mouth daily., Disp: 30 tablet, Rfl:   ? ?Radiology:  ? ?PORTABLE CHEST 1 VIEW 01/17/2020  ?COMPARISON:  04/08/2015 ?Cardiac silhouette is normal in size. No mediastinal or hilar masses. No evidence of adenopathy. ?Lungs are clear.  No pleural effusion or pneumothorax. ?Skeletal structures are grossly intact. ? ?Cardiac Studies:  ? ?Echocardiogram: ?02/29/2020: LVEF 55-60%, mildly dilated left ventricular cavity, mild LVH, normal diastolic filling pattern, normal left atrial pressure, mildly dilated left atrium, mild MR, trace TR. ? ?Stress Testing: ?Lexiscan Sestamibi stress test 02/29/2020: ?Stress EKG at 78% MPHR shows sinus tachycardia, nonspecific T wave inversion in inferior leads, frequent PVC's.  ?SPECT images show large sized, medium intensity, predominantly reversible perfusion defect in basal to apical, inferoseptal, inferior, and inferolateral myocadium. While study quality may be limited due to attenuation (BMI 52), ischemia cannot be excluded. Stress LVEF 65%. Recommend clinical correlation. High risk study.  ? ?Vascular imaging: ?Ultrasound venous duplex: ?03/07/2019: ?Right: Findings consistent with acute deep vein thrombosis involving the right popliteal vein. There is evidence of superficial vein thrombosis  involving a varicose vein of the proximal, medial, right calf.  ?Left: No evidence of common femoral vein obstruction. ? ?Left Heart Catheterization 04/19/20:  ?LV: Moderately elevated LVEDP.  ?LM: Mid 40%. Hazy.

## 2022-02-28 ENCOUNTER — Other Ambulatory Visit: Payer: Self-pay | Admitting: Cardiology

## 2022-02-28 DIAGNOSIS — E7800 Pure hypercholesterolemia, unspecified: Secondary | ICD-10-CM

## 2022-02-28 DIAGNOSIS — I25118 Atherosclerotic heart disease of native coronary artery with other forms of angina pectoris: Secondary | ICD-10-CM

## 2022-03-08 ENCOUNTER — Telehealth: Payer: Self-pay

## 2022-03-09 ENCOUNTER — Telehealth: Payer: Self-pay

## 2022-03-09 NOTE — Telephone Encounter (Signed)
Called and spoke to patient's mother patient was sleeping when I called mother will pass along the message and if patient has any questions or concerns he will call back

## 2022-03-09 NOTE — Telephone Encounter (Signed)
It is a small dose and will help with both anxiety and weight loss.I want him to try for 2 weeks

## 2022-03-09 NOTE — Telephone Encounter (Signed)
Pt called and stated that he needs to have Xanax prescribed because the Cymbalta is not the working. He stated that the Cymbalta is making his anxiety much worse and not helping his panic attacks. Please advise.

## 2022-04-16 ENCOUNTER — Other Ambulatory Visit: Payer: Self-pay | Admitting: Student

## 2022-05-01 ENCOUNTER — Other Ambulatory Visit: Payer: Self-pay | Admitting: Student

## 2022-05-01 DIAGNOSIS — E7800 Pure hypercholesterolemia, unspecified: Secondary | ICD-10-CM

## 2022-05-01 DIAGNOSIS — I25118 Atherosclerotic heart disease of native coronary artery with other forms of angina pectoris: Secondary | ICD-10-CM

## 2022-05-03 ENCOUNTER — Other Ambulatory Visit: Payer: Self-pay

## 2022-05-23 ENCOUNTER — Encounter (INDEPENDENT_AMBULATORY_CARE_PROVIDER_SITE_OTHER): Payer: Self-pay

## 2022-07-03 ENCOUNTER — Other Ambulatory Visit: Payer: Self-pay | Admitting: Cardiology

## 2022-07-03 DIAGNOSIS — E7800 Pure hypercholesterolemia, unspecified: Secondary | ICD-10-CM

## 2022-07-03 DIAGNOSIS — I25118 Atherosclerotic heart disease of native coronary artery with other forms of angina pectoris: Secondary | ICD-10-CM

## 2022-08-27 ENCOUNTER — Ambulatory Visit: Payer: Medicaid Other | Admitting: Cardiology

## 2022-09-04 ENCOUNTER — Other Ambulatory Visit: Payer: Self-pay | Admitting: Cardiology

## 2022-09-04 DIAGNOSIS — I25118 Atherosclerotic heart disease of native coronary artery with other forms of angina pectoris: Secondary | ICD-10-CM

## 2022-09-04 DIAGNOSIS — E7800 Pure hypercholesterolemia, unspecified: Secondary | ICD-10-CM

## 2022-09-18 ENCOUNTER — Encounter: Payer: Self-pay | Admitting: Cardiology

## 2022-09-18 ENCOUNTER — Ambulatory Visit: Payer: Medicaid Other | Admitting: Cardiology

## 2022-09-18 VITALS — BP 149/87 | HR 69 | Resp 16 | Ht 74.0 in | Wt >= 6400 oz

## 2022-09-18 DIAGNOSIS — Z6841 Body Mass Index (BMI) 40.0 and over, adult: Secondary | ICD-10-CM | POA: Diagnosis not present

## 2022-09-18 DIAGNOSIS — I25118 Atherosclerotic heart disease of native coronary artery with other forms of angina pectoris: Secondary | ICD-10-CM | POA: Diagnosis not present

## 2022-09-18 DIAGNOSIS — I480 Paroxysmal atrial fibrillation: Secondary | ICD-10-CM | POA: Diagnosis not present

## 2022-09-18 DIAGNOSIS — E7800 Pure hypercholesterolemia, unspecified: Secondary | ICD-10-CM

## 2022-09-18 DIAGNOSIS — I10 Essential (primary) hypertension: Secondary | ICD-10-CM

## 2022-09-18 MED ORDER — AMLODIPINE BESY-BENAZEPRIL HCL 5-10 MG PO CAPS: 1.0000 | ORAL_CAPSULE | Freq: Every day | ORAL | 1 refills | Status: DC

## 2022-09-18 NOTE — Progress Notes (Signed)
Primary Physician/Referring:  Wenda Low, MD  Patient ID: Joseph Lang, male    DOB: September 17, 1976, 46 y.o.   MRN: [id number]        DOS: 09/18/22  Chief Complaint  Patient presents with   Coronary Artery Disease   Atrial Fibrillation   Hypertension   Follow-up    6 months   HPI:    Joseph Lang  is a 46 y.o. Caucasian  male patient with past medical history of paroxysmal atrial fibrillation and cardioversion on 01/17/2020 and 07/01/2020, hypertension, morbid obesity, obstructive sleep apnea on CPAP, tobacco use disorder still vaping, history of acute deep vein thrombosis and pulmonary embolism on 04/08/2015 following gastric sleeve resection and again presented with spontaneous right leg DVT on 03/07/2019 presently on long term Xarelto.  Due to multivessel coronary disease underwent CABG x 2 and LAA ligation on 06/27/2020.  Presently on amiodarone 100 mg daily, unable to wean him off as he feels palpitations, he also has severely symptomatic atrial fibrillation if he has recurrence.  He has stopped working since CABG, trying for disability.  He has no specific complaints today except for complaining of marked generalized weakness and fatigue all the time.  Past Medical History:  Diagnosis Date   Coronary artery disease 2021   Dysrhythmia 2021   Afib   GERD (gastroesophageal reflux disease)    History of blood clots    Hx of deep vein thrombophlebitis of lower extremity    Hx of gastric bypass    sleeve   Hx of pulmonary embolus    Hypertension    Obesity    Other hyperlipidemia    PONV (postoperative nausea and vomiting)    Throwing up after wt loss surgery   S/P CABG x 2 06/28/2020: LIMA to LAD, Free Radial graft to D1. LAA ligation with 40 mm Atricure. 06/27/2020   Sleep apnea    Swelling of both lower extremities    Tobacco abuse    Social History   Tobacco Use   Smoking status: Former    Packs/day: 0.50    Years: 30.00    Total pack years: 15.00    Types: Cigarettes     Quit date: 04/20/2020    Years since quitting: 2.4   Smokeless tobacco: Never   Tobacco comments:    half a pack daily  Substance Use Topics   Alcohol use: Not Currently   Marital Status: Single  ROS  Review of Systems  Constitutional: Positive for malaise/fatigue.  Cardiovascular:  Negative for chest pain, dyspnea on exertion and leg swelling.  Respiratory:  Positive for snoring (on CPAP and compliant).    Objective  Blood pressure (!) 149/87, pulse 69, resp. rate 16, height 6\' 2"  (1.88 m), weight (!) 434 lb (196.9 kg), SpO2 96 %.     09/18/2022    2:27 PM 09/18/2022    2:01 PM 02/22/2022    3:13 PM  Vitals with BMI  Height  6\' 2"  6\' 2"   Weight  434 lbs 437 lbs 6 oz  BMI  [id number]  Systolic 123456 [id number]  Diastolic 87 92 92  Pulse 69 71 80     Physical Exam Constitutional:      Appearance: He is morbidly obese.  Neck:     Vascular: No carotid bruit or JVD.  Cardiovascular:     Rate and Rhythm: Normal rate and regular rhythm.     Pulses: Intact distal pulses.     Heart sounds: Normal  heart sounds. No murmur heard.    No gallop.  Pulmonary:     Effort: Pulmonary effort is normal.     Breath sounds: Normal breath sounds.  Abdominal:     General: Bowel sounds are normal.     Palpations: Abdomen is soft.  Musculoskeletal:     Right lower leg: No edema.     Left lower leg: No edema.    Laboratory examination:   External labs:   Cholesterol, total 127.000 m 12/28/2020 HDL 47.000 mg 12/28/2020 LDL 65.000 mg 12/28/2020 Triglycerides 75.000 mg 12/28/2020  A1C 5.900 % 12/28/2020 TSH 2.050 05/31/2022  Hemoglobin 13.700 g/d 05/31/2022 Platelets 266.000 x1 12/28/2020  Creatinine, Serum 0.750 mg/ 05/31/2022 Potassium 4.500 mm 05/31/2022 ALT (SGPT) 40.000 U/L 05/31/2022  Medications and allergies   Allergies  Allergen Reactions   Amoxicillin Nausea And Vomiting     Current Outpatient Medications:    amiodarone (PACERONE) 200 MG tablet, Take 0.5 tablets (100 mg  total) by mouth daily., Disp: 7 tablet, Rfl: 0   amLODipine-benazepril (LOTREL) 5-10 MG capsule, Take 1 capsule by mouth daily., Disp: 90 capsule, Rfl: 1   aspirin EC 81 MG tablet, Take 1 tablet (81 mg total) by mouth daily., Disp: 90 tablet, Rfl: 3   atorvastatin (LIPITOR) 80 MG tablet, TAKE 1 TABLET BY MOUTH DAILY, Disp: 30 tablet, Rfl: 1   metoprolol tartrate (LOPRESSOR) 100 MG tablet, Take 1 tablet (100 mg total) by mouth 2 (two) times daily., Disp: 180 tablet, Rfl: 3   RABEprazole (ACIPHEX) 20 MG tablet, Take 20 mg by mouth daily. , Disp: , Rfl:    XARELTO 20 MG TABS tablet, Take 1 tablet (20 mg total) by mouth daily., Disp: 30 tablet, Rfl:    Radiology:   PORTABLE CHEST 1 VIEW 01/17/2020  COMPARISON:  04/08/2015 Cardiac silhouette is normal in size. No mediastinal or hilar masses. No evidence of adenopathy. Lungs are clear.  No pleural effusion or pneumothorax. Skeletal structures are grossly intact.  Cardiac Studies:   Echocardiogram: 02/29/2020: LVEF 55-60%, mildly dilated left ventricular cavity, mild LVH, normal diastolic filling pattern, normal left atrial pressure, mildly dilated left atrium, mild MR, trace TR.  Stress Testing: Lexiscan Sestamibi stress test 02/29/2020: Stress EKG at 78% MPHR shows sinus tachycardia, nonspecific T wave inversion in inferior leads, frequent PVC's.  SPECT images show large sized, medium intensity, predominantly reversible perfusion defect in basal to apical, inferoseptal, inferior, and inferolateral myocadium. While study quality may be limited due to attenuation (BMI 52), ischemia cannot be excluded. Stress LVEF 65%. Recommend clinical correlation. High risk study.   Vascular imaging: Ultrasound venous duplex: 03/07/2019: Right: Findings consistent with acute deep vein thrombosis involving the right popliteal vein. There is evidence of superficial vein thrombosis involving a varicose vein of the proximal, medial, right calf.  Left: No evidence  of common femoral vein obstruction.  Left Heart Catheterization 04/19/20:  LV: Moderately elevated LVEDP.  LM: Mid 40%. Hazy. Severe diffuse multivessel disease. Ectasia of the LAD, RCA. Prox LAD aneurysmal. Occluded after origin of a very large D1. D1 is very large with mid 90% stenosis. Ipsilateral and contralateral (RCA) to mid LAD noted.  Cx: Prox 70-80% stenosis involves the bifurcation of large OM-1. At the bifurcation there is at least a 80% stenosis. RCA: Diffusely ectatic. PL branch has a focal 50% stenosis.  Slow flow noted in the RCA.    Rec: Complex multivessel CAD and will benefit from CABG to LAD and D1 and OM1 with arterial conduits. Maze  and LAA ligation. Patient went into atrial fibrillation in the cath lab. 70 mL contrast used. IV dilt and metoprolol given but did not convert to sinus. He will resume Xarelto tonight. Patient wants to come in and discuss personally at the office. Not happy about CABG potential.  EKG:   EKG 09/18/2022: Normal sinus rhythm at rate of 70 bpm, normal axis, poor R wave progression, cannot exclude anteroseptal infarct old.  Low-voltage complexes, consider pulmonary disease pattern.  Compared to 02/16/2021, no significant change.  Assessment     ICD-10-CM   1. Coronary artery disease involving native coronary artery of native heart with other form of angina pectoris (HCC)  I25.118 EKG 12-Lead    2. Primary hypertension  I10 amLODipine-benazepril (LOTREL) 5-10 MG capsule    TSH    3. Paroxysmal atrial fibrillation (HCC)  I48.0     4. Class 3 severe obesity due to excess calories with serious comorbidity and body mass index (BMI) of 50.0 to 59.9 in adult (HCC)  E66.01    Z68.43     5. Hypercholesteremia  E78.00 Lipid Panel With LDL/HDL Ratio      CHA2DS2-VASc Score is 2.  Yearly risk of stroke: 2.3% (CAD, HTN).  Meds ordered this encounter  Medications   amLODipine-benazepril (LOTREL) 5-10 MG capsule    Sig: Take 1 capsule by mouth daily.     Dispense:  90 capsule    Refill:  1    Medications Discontinued During This Encounter  Medication Reason   methocarbamol (ROBAXIN-750) 750 MG tablet    DULoxetine (CYMBALTA) 30 MG capsule    isosorbide mononitrate (IMDUR) 30 MG 24 hr tablet    benazepril (LOTENSIN) 10 MG tablet Change in therapy    Recommendations:   Joseph Lang  is a 46 y.o. Caucasian  male patient with past medical history of paroxysmal atrial fibrillation and cardioversion on 01/17/2020 and 07/01/2020, hypertension, morbid obesity, obstructive sleep apnea on CPAP, tobacco use disorder still vaping, history of acute deep vein thrombosis and pulmonary embolism on 04/08/2015 following gastric sleeve resection and again presented with spontaneous right leg DVT on 03/07/2019 presently on long term Xarelto.  Due to multivessel coronary disease underwent CABG x 2 and LAA ligation on 06/27/2020.  1. Coronary artery disease involving native coronary artery of native heart with other form of angina pectoris [facility]) He presents here for 7-month office visit, fortunately has not had any recurrence of angina pectoris or heart failure.  Continue present medications. 2. Primary hypertension   Blood pressure is elevated today, even in the PCP office the diastolic blood pressure was >80.  He is presently on benazepril, will change it to benazepril 10 mg along with addition of amlodipine 5 mg daily, would like to see him back in 2 months for follow-up.  3. Paroxysmal atrial fibrillation (HCC) He is extremely symptomatic with atrial fibrillation recurrence.  He is presently on amiodarone 100 mg daily and is maintaining sinus rhythm.  Although he is only 46 years of age, given his severely symptomatic A-fib presentation, ongoing OSA on CPAP, morbid obesity, hypertension, will continue with amiodarone for now.  Will check TSH along with lipid profile testing for drug monitoring.  4. Class 3 severe obesity due to excess calories with serious  comorbidity and body mass index (BMI) of 50.0 to 59.9 in adult [facility]) Complains of fatigue, advised him that excess calories is also contributing to his overall poor health and feeling bad.  Encouraged him to exercise regularly and  also restrict his calories.  5. Hypercholesteremia Previously lipids have been well-controlled.  Lipid profile ordered today.  Otherwise from cardiac standpoint he remains stable, I will see him back in 2 months specifically for hypertension and hyperlipidemia.    Adrian Prows, PA-C 09/18/2022, 3:12 PM Office: [phone]

## 2022-10-02 DIAGNOSIS — R69 Illness, unspecified: Secondary | ICD-10-CM | POA: Diagnosis not present

## 2022-10-05 ENCOUNTER — Other Ambulatory Visit: Payer: Self-pay

## 2022-10-05 ENCOUNTER — Encounter (HOSPITAL_COMMUNITY): Payer: Self-pay

## 2022-10-05 ENCOUNTER — Emergency Department (HOSPITAL_COMMUNITY)
Admission: EM | Admit: 2022-10-05 | Discharge: 2022-10-05 | Disposition: A | Discharge status: Home / Self Care | Payer: Medicaid Other | Attending: Emergency Medicine | Admitting: Emergency Medicine

## 2022-10-05 DIAGNOSIS — Z7982 Long term (current) use of aspirin: Secondary | ICD-10-CM | POA: Diagnosis not present

## 2022-10-05 DIAGNOSIS — R4189 Other symptoms and signs involving cognitive functions and awareness: Secondary | ICD-10-CM | POA: Insufficient documentation

## 2022-10-05 DIAGNOSIS — Z7901 Long term (current) use of anticoagulants: Secondary | ICD-10-CM | POA: Insufficient documentation

## 2022-10-05 DIAGNOSIS — R410 Disorientation, unspecified: Secondary | ICD-10-CM | POA: Diagnosis not present

## 2022-10-05 DIAGNOSIS — R42 Dizziness and giddiness: Secondary | ICD-10-CM | POA: Diagnosis present

## 2022-10-05 LAB — URINALYSIS, ROUTINE W REFLEX MICROSCOPIC
Bacteria, UA: NONE SEEN
Bilirubin Urine: NEGATIVE
Glucose, UA: NEGATIVE mg/dL
Hgb urine dipstick: NEGATIVE
Ketones, ur: NEGATIVE mg/dL
Nitrite: NEGATIVE
Protein, ur: NEGATIVE mg/dL
Specific Gravity, Urine: 1.01 (ref 1.005–1.030)
pH: 5 (ref 5.0–8.0)

## 2022-10-05 LAB — CBG MONITORING, ED: Glucose-Capillary: 103 mg/dL — ABNORMAL HIGH (ref 70–99)

## 2022-10-05 LAB — CBC
HCT: 44 % (ref 39.0–52.0)
Hemoglobin: 14.3 g/dL (ref 13.0–17.0)
MCH: 28.5 pg (ref 26.0–34.0)
MCHC: 32.5 g/dL (ref 30.0–36.0)
MCV: 87.8 fL (ref 80.0–100.0)
Platelets: 296 10*3/uL (ref 150–400)
RBC: 5.01 MIL/uL (ref 4.22–5.81)
RDW: 13.3 % (ref 11.5–15.5)
WBC: 9.1 10*3/uL (ref 4.0–10.5)
nRBC: 0 % (ref 0.0–0.2)

## 2022-10-05 LAB — BASIC METABOLIC PANEL
Anion gap: 10 (ref 5–15)
BUN: 13 mg/dL (ref 6–20)
CO2: 24 mmol/L (ref 22–32)
Calcium: 9.4 mg/dL (ref 8.9–10.3)
Chloride: 107 mmol/L (ref 98–111)
Creatinine, Ser: 0.77 mg/dL (ref 0.61–1.24)
GFR, Estimated: >60 mL/min (ref >60)
Glucose, Bld: 109 mg/dL — ABNORMAL HIGH (ref 70–99)
Potassium: 4 mmol/L (ref 3.5–5.1)
Sodium: 141 mmol/L (ref 135–145)

## 2022-10-05 NOTE — ED Triage Notes (Signed)
Patient said for 3 days he feels like his brain is floating. He said he is having panic attacks that makes his heart flutter. He has intermittent blurry out of both eyes. Has a headache, which he says he does not get often.

## 2022-10-05 NOTE — ED Provider Notes (Signed)
[place] DEPT Provider Note   CSN: [id number] Arrival date & time: 10/05/22  1323     History  Chief Complaint  Patient presents with   Dizziness    Joseph Lang is a 46 y.o. male.  HPI Patient reports that he feels like he has a fogginess in his head.  He has no associated headache.  No vertiginous type dizziness.  No visual changes.  Reports this has been going on for several days.  Denies there have been any recent changes.  He is on the same medications.  He is on a number of cardiac medications with significant medical history including history of atrial fibrillation and chronic anticoagulation on Xarelto.  This has not had any associated symptoms of gait incoordination, focal weakness numbness.  Patient reports he has had problems with anxiety since having a lot of medical problems.  He very occasionally takes a low-dose of Xanax.  He reports he had previously been tried on BuSpar and Prozac but had significant adverse effects with them so only rarely takes a Xanax for anxiety.  He does advise that he has a lot of restful life situations.  Patient denies any alcohol use.  He reports he occasionally vapes.  No other drug use.    Home Medications Prior to Admission medications   Medication Sig Start Date End Date Taking? Authorizing Provider  amiodarone (PACERONE) 200 MG tablet Take 0.5 tablets (100 mg total) by mouth daily. 01/03/21   Adrian Prows, MD  amLODipine-benazepril (LOTREL) 5-10 MG capsule Take 1 capsule by mouth daily. 09/18/22   Adrian Prows, MD  aspirin EC 81 MG tablet Take 1 tablet (81 mg total) by mouth daily. 04/23/20   Adrian Prows, MD  atorvastatin (LIPITOR) 80 MG tablet TAKE 1 TABLET BY MOUTH DAILY 09/04/22   Adrian Prows, MD  metoprolol tartrate (LOPRESSOR) 100 MG tablet Take 1 tablet (100 mg total) by mouth 2 (two) times daily. 11/14/21   Cantwell, Celeste C, PA-C  RABEprazole (ACIPHEX) 20 MG tablet Take 20 mg by mouth daily.  01/04/20    [provider]  XARELTO 20 MG TABS tablet Take 1 tablet (20 mg total) by mouth daily. 02/22/22   Adrian Prows, MD      Allergies    Amoxicillin    Review of Systems   Review of Systems  Physical Exam Updated Vital Signs BP (!) 148/72 (BP Location: Right Arm)   Pulse (!) 57   Temp 98.3 F (36.8 C) (Oral)   Resp 16   Ht 6\' 2"  (1.88 m)   Wt (!) 195 kg   SpO2 97%   BMI 55.21 kg/m  Physical Exam Constitutional:      Comments: Alert nontoxic well in appearance.  HENT:     Right Ear: Tympanic membrane normal.     Left Ear: Tympanic membrane normal.     Mouth/Throat:     Mouth: Mucous membranes are moist.     Pharynx: Oropharynx is clear.  Eyes:     Extraocular Movements: Extraocular movements intact.     Conjunctiva/sclera: Conjunctivae normal.     Pupils: Pupils are equal, round, and reactive to light.  Cardiovascular:     Rate and Rhythm: Normal rate and regular rhythm.  Pulmonary:     Effort: Pulmonary effort is normal.     Breath sounds: Normal breath sounds.  Abdominal:     General: There is no distension.     Palpations: Abdomen is soft.  Tenderness: There is no abdominal tenderness.  Musculoskeletal:        General: No swelling or tenderness. Normal range of motion.     Right lower leg: No edema.     Left lower leg: No edema.  Skin:    General: Skin is warm and dry.  Neurological:     General: No focal deficit present.     Mental Status: He is oriented to person, place, and time.     Motor: No weakness.     Coordination: Coordination normal.  Psychiatric:        Mood and Affect: Mood normal.     ED Results / Procedures / Treatments   Labs (all labs ordered are listed, but only abnormal results are displayed) Labs Reviewed  BASIC METABOLIC PANEL - Abnormal; Notable for the following components:      Result Value   Glucose, Bld 109 (*)    All other components within normal limits  URINALYSIS, ROUTINE W REFLEX MICROSCOPIC - Abnormal; Notable  for the following components:   Leukocytes,Ua SMALL (*)    All other components within normal limits  CBG MONITORING, ED - Abnormal; Notable for the following components:   Glucose-Capillary 103 (*)    All other components within normal limits  CBC    EKG EKG Interpretation  Date/Time:  Friday October 05 2022 13:34:11 EST Ventricular Rate:  61 PR Interval:  181 QRS Duration: 97 QT Interval:  389 QTC Calculation: 392 R Axis:   64 Text Interpretation: Sinus rhythm Low voltage, precordial leads Probable anteroseptal infarct, old Baseline wander in lead(s) V4 no sig change from previous Confirmed by Arby Barrette [phone]) on 10/05/2022 4:29:58 PM  Radiology No results found.  Procedures Procedures    Medications Ordered in ED Medications - No data to display  ED Course/ Medical Decision Making/ A&P                           Medical Decision Making Amount and/or Complexity of Data Reviewed Labs: ordered.   Patient presents as outlined.  He does have multiple comorbid medical conditions.  At this time he is well in appearance.  Vital signs are normal.  EKG shows sinus rhythm at 60 with no acute changes.  Differential diagnosis includes dysrhythmia\infectious etiology such as sinusitis\depression anxiety\medication induced symptoms\dehydration or electrolyte abnormality.  Electrolytes, CBC, EKG urinalysis within normal limits.  At this time I have low suspicion for stroke.  Patient is alert with normal mental status and function.  No evidence of any dysrhythmia at this time based on EKG and exam.  Heart is regular.  Patient is chronically anticoagulated on Xarelto.  He does not have headache I do not suspect intracranial bleed.  He does not have lower extremity pain or chest pain with no occasion at this time of PE.  With patient well in appearance, normal exam and normal workup, I feel he is stable for discharge at this time.  We have reviewed signs and symptoms that  necessitate immediate return.  Patient voices understanding.        Final Clinical Impression(s) / ED Diagnoses Final diagnoses:  Brain fog    Rx / DC Orders ED Discharge Orders     None         Arby Barrette, MD 10/05/22 1701

## 2022-10-05 NOTE — Discharge Instructions (Signed)
1.  At this time the exact cause of your symptoms is unclear.  A feeling of mental fog can be associated with many different conditions.  Your diagnostic evaluation does not show abnormalities currently.  Sometimes medication side effects, illness, depression and anxiety can cause this condition.  Seem well at this time but return immediately if you have new worsening or change in condition.

## 2022-10-05 NOTE — ED Provider Triage Note (Signed)
Emergency Medicine Provider Triage Evaluation Note  Joseph Lang , a 46 y.o. male  was evaluated in triage.  Pt states in the last 3 days he felt like his "head is floating".  States he does not feel like it is headache, lightheadedness or vertigo. He has not tried any medication at home.  Patient reports blurry vision of both eyes intermittently.  States he has had a lot of water thinking it might be dehydration however symptoms still persist.  No fever, chest pain, shortness of breath, nausea, running, bowel changes, urinary symptoms.  Review of Systems  Positive: As above Negative: As above  Physical Exam  BP (!) 142/87   Pulse 60   Temp 98.3 F (36.8 C) (Oral)   Resp 17   Ht 6\' 2"  (1.88 m)   Wt (!) 195 kg   SpO2 96%   BMI 55.21 kg/m  Gen:   Awake, no distress   Resp:  Normal effort  MSK:   Moves extremities without difficulty  Other:  Neuroexam unremarkable.  Medical Decision Making  Medically screening exam initiated at 1:55 PM.  Appropriate orders placed.  E Coppock was informed that the remainder of the evaluation will be completed by another provider, this initial triage assessment does not replace that evaluation, and the importance of remaining in the ED until their evaluation is complete.     [place], PA 10/05/22 1400

## 2022-10-10 ENCOUNTER — Telehealth: Payer: Self-pay

## 2022-10-10 NOTE — Telephone Encounter (Signed)
LVM. Sending mychart msg. AS, CMA 

## 2022-11-06 ENCOUNTER — Other Ambulatory Visit: Payer: Self-pay | Admitting: Cardiology

## 2022-11-06 DIAGNOSIS — I25118 Atherosclerotic heart disease of native coronary artery with other forms of angina pectoris: Secondary | ICD-10-CM

## 2022-11-06 DIAGNOSIS — E7800 Pure hypercholesterolemia, unspecified: Secondary | ICD-10-CM

## 2022-11-22 ENCOUNTER — Ambulatory Visit: Payer: Medicaid Other | Admitting: Cardiology

## 2022-12-10 ENCOUNTER — Ambulatory Visit: Payer: Medicaid Other | Admitting: Cardiology

## 2022-12-10 ENCOUNTER — Encounter: Payer: Self-pay | Admitting: Cardiology

## 2022-12-10 VITALS — BP 132/77 | HR 59 | Resp 16 | Ht 74.0 in | Wt >= 6400 oz

## 2022-12-10 DIAGNOSIS — I480 Paroxysmal atrial fibrillation: Secondary | ICD-10-CM

## 2022-12-10 DIAGNOSIS — I10 Essential (primary) hypertension: Secondary | ICD-10-CM

## 2022-12-10 DIAGNOSIS — E559 Vitamin D deficiency, unspecified: Secondary | ICD-10-CM

## 2022-12-10 DIAGNOSIS — I25118 Atherosclerotic heart disease of native coronary artery with other forms of angina pectoris: Secondary | ICD-10-CM | POA: Diagnosis not present

## 2022-12-10 NOTE — Progress Notes (Signed)
Primary Physician/Referring:  Wenda Low, MD  Patient ID: Joseph Lang, male    DOB: 12/07/1975, 47 y.o.   MRN: [id number]        DOS: 12/10/22  Chief Complaint  Patient presents with   Coronary Artery Disease   Atrial Fibrillation   Follow-up    2 months   HPI:    Joseph Lang  is a 47 y.o. Caucasian  male patient with past medical history of paroxysmal atrial fibrillation and cardioversion on 01/17/2020 and 07/01/2020, hypertension, morbid obesity, obstructive sleep apnea on CPAP, tobacco use disorder still vaping, history of acute deep vein thrombosis and pulmonary embolism on 04/08/2015 following gastric sleeve resection and again presented with spontaneous right leg DVT on 03/07/2019 presently on long term Xarelto.  Due to multivessel coronary disease underwent CABG x 2 and LAA ligation on 06/27/2020.  On his last office visit 2 months ago, blood pressure was elevated and had added 5 mg amlodipine to benazepril low-dose.  He is also on metoprolol 100 mg twice daily.  Patient could not tolerate amlodipine due to marked fatigue and dizziness.  Otherwise he is presently doing well and has no specific complaints except for being tired all the time.  He has been smoking any cigarettes anymore however still using vape.  Past Medical History:  Diagnosis Date   Coronary artery disease 2021   Dysrhythmia 2021   Afib   GERD (gastroesophageal reflux disease)    History of blood clots    Hx of deep vein thrombophlebitis of lower extremity    Hx of gastric bypass    sleeve   Hx of pulmonary embolus    Hypertension    Obesity    Other hyperlipidemia    PONV (postoperative nausea and vomiting)    Throwing up after wt loss surgery   S/P CABG x 2 06/28/2020: LIMA to LAD, Free Radial graft to D1. LAA ligation with 40 mm Atricure. 06/27/2020   Sleep apnea    Swelling of both lower extremities    Tobacco abuse    Social History   Tobacco Use   Smoking status: Former    Packs/day: 0.50     Years: 30.00    Total pack years: 15.00    Types: Cigarettes    Quit date: 04/20/2020    Years since quitting: 2.6   Smokeless tobacco: Never   Tobacco comments:    half a pack daily  Substance Use Topics   Alcohol use: Not Currently   Marital Status: Single  ROS  Review of Systems  Constitutional: Positive for malaise/fatigue.  Cardiovascular:  Negative for chest pain, dyspnea on exertion and leg swelling.  Respiratory:  Positive for snoring (on CPAP and compliant).    Objective  Blood pressure 132/77, pulse (!) 59, resp. rate 16, height '6\' 2"'$  (1.88 m), weight (!) 436 lb 3.2 oz (197.9 kg), SpO2 96 %.     12/10/2022    3:09 PM 10/05/2022    4:27 PM 10/05/2022    1:34 PM  Vitals with BMI  Height '6\' 2"'$     Weight 436 lbs 3 oz    BMI [id number]    Systolic [id number]  Diastolic 77 72 87  Pulse 59 57 60     Physical Exam Constitutional:      Appearance: He is morbidly obese.  Neck:     Vascular: No carotid bruit or JVD.  Cardiovascular:     Rate and Rhythm: Normal rate and regular  rhythm.     Pulses: Intact distal pulses.     Heart sounds: Normal heart sounds. No murmur heard.    No gallop.  Pulmonary:     Effort: Pulmonary effort is normal.     Breath sounds: Normal breath sounds.  Abdominal:     General: Bowel sounds are normal.     Palpations: Abdomen is soft.  Musculoskeletal:     Right lower leg: No edema.     Left lower leg: No edema.    Laboratory examination:   Lab Results  Component Value Date   NA 141 10/05/2022   K 4.0 10/05/2022   CO2 24 10/05/2022   GLUCOSE 109 (H) 10/05/2022   BUN 13 10/05/2022   CREATININE 0.77 10/05/2022   CALCIUM 9.4 10/05/2022   EGFR 109 12/28/2020   GFRNONAA >60 10/05/2022    Lab Results  Component Value Date   WBC 9.1 10/05/2022   HGB 14.3 10/05/2022   HCT 44.0 10/05/2022   MCV 87.8 10/05/2022   PLT 296 10/05/2022    Lab Results  Component Value Date   CHOL 127 12/28/2020   HDL 47 12/28/2020   LDLCALC 65  12/28/2020   TRIG 75 12/28/2020   CHOLHDL 2.7 12/28/2020    External labs:   Cholesterol, total 127.000 m 12/28/2020 HDL 47.000 mg 12/28/2020 LDL 65.000 mg 12/28/2020 Triglycerides 75.000 mg 12/28/2020  A1C 5.900 % 12/28/2020 TSH 2.050 05/31/2022  Hemoglobin 13.700 g/d 05/31/2022 Platelets 266.000 x1 12/28/2020  Creatinine, Serum 0.750 mg/ 05/31/2022 Potassium 4.500 mm 05/31/2022 ALT (SGPT) 40.000 U/L 05/31/2022  Medications and allergies   Allergies  Allergen Reactions   Amoxicillin Nausea And Vomiting     Current Outpatient Medications:    amiodarone (PACERONE) 200 MG tablet, Take 0.5 tablets (100 mg total) by mouth daily. (Patient taking differently: Take 100 mg by mouth 2 (two) times daily.), Disp: 7 tablet, Rfl: 0   aspirin EC 81 MG tablet, Take 1 tablet (81 mg total) by mouth daily., Disp: 90 tablet, Rfl: 3   atorvastatin (LIPITOR) 80 MG tablet, TAKE 1 TABLET BY MOUTH DAILY, Disp: 30 tablet, Rfl: 1   benazepril (LOTENSIN) 10 MG tablet, Take 10 mg by mouth daily., Disp: , Rfl:    Cholecalciferol (VITAMIN D3) 75 MCG (3000 UT) TABS, Take 1 tablet by mouth daily., Disp: , Rfl:    isosorbide mononitrate (IMDUR) 30 MG 24 hr tablet, Take 0.5 tablets by mouth daily., Disp: , Rfl:    metoprolol tartrate (LOPRESSOR) 100 MG tablet, Take 1 tablet (100 mg total) by mouth 2 (two) times daily. (Patient taking differently: Take 0.5 tablets by mouth 2 (two) times daily.), Disp: 180 tablet, Rfl: 3   RABEprazole (ACIPHEX) 20 MG tablet, Take 20 mg by mouth daily. , Disp: , Rfl:    XARELTO 20 MG TABS tablet, Take 1 tablet (20 mg total) by mouth daily., Disp: 30 tablet, Rfl:    Radiology:   PORTABLE CHEST 1 VIEW 01/17/2020  COMPARISON:  04/08/2015 Cardiac silhouette is normal in size. No mediastinal or hilar masses. No evidence of adenopathy. Lungs are clear.  No pleural effusion or pneumothorax. Skeletal structures are grossly intact.  Cardiac Studies:   Echocardiogram: 02/29/2020: LVEF  55-60%, mildly dilated left ventricular cavity, mild LVH, normal diastolic filling pattern, normal left atrial pressure, mildly dilated left atrium, mild MR, trace TR.  Stress Testing: Lexiscan Sestamibi stress test 02/29/2020: Stress EKG at 78% MPHR shows sinus tachycardia, nonspecific T wave inversion in inferior leads, frequent PVC's.  SPECT images show large sized, medium intensity, predominantly reversible perfusion defect in basal to apical, inferoseptal, inferior, and inferolateral myocadium. While study quality may be limited due to attenuation (BMI 52), ischemia cannot be excluded. Stress LVEF 65%. Recommend clinical correlation. High risk study.   Vascular imaging: Ultrasound venous duplex: 03/07/2019: Right: Findings consistent with acute deep vein thrombosis involving the right popliteal vein. There is evidence of superficial vein thrombosis involving a varicose vein of the proximal, medial, right calf.  Left: No evidence of common femoral vein obstruction.  Left Heart Catheterization 04/19/20:  LV: Moderately elevated LVEDP.  LM: Mid 40%. Hazy. Severe diffuse multivessel disease. Ectasia of the LAD, RCA. Prox LAD aneurysmal. Occluded after origin of a very large D1. D1 is very large with mid 90% stenosis. Ipsilateral and contralateral (RCA) to mid LAD noted.  Cx: Prox 70-80% stenosis involves the bifurcation of large OM-1. At the bifurcation there is at least a 80% stenosis. RCA: Diffusely ectatic. PL branch has a focal 50% stenosis.  Slow flow noted in the RCA.    Rec: Complex multivessel CAD and will benefit from CABG to LAD and D1 and OM1 with arterial conduits. Maze and LAA ligation. Patient went into atrial fibrillation in the cath lab. 70 mL contrast used. IV dilt and metoprolol given but did not convert to sinus. He will resume Xarelto tonight. Patient wants to come in and discuss personally at the office. Not happy about CABG potential.  EKG:   EKG 12/10/2022: Normal sinus  rhythm at rate of 58 bpm, normal axis.  Poor R progression, cannot exclude anteroseptal infarct old.  No evidence of ischemia, normal QT interval.   No significant change from EKG 09/18/2022   Assessment     ICD-10-CM   1. Coronary artery disease involving native coronary artery of native heart with other form of angina pectoris ([place])  I25.118 Lipid Panel With LDL/HDL Ratio    TSH    2. Primary hypertension  I10 TSH    3. Paroxysmal atrial fibrillation (HCC)  I48.0 EKG 12-Lead    4. Hypovitaminosis D  E55.9 VITAMIN D 25 Hydroxy (Vit-D Deficiency, Fractures)      CHA2DS2-VASc Score is 2.  Yearly risk of stroke: 2.3% (CAD, HTN).  No orders of the defined types were placed in this encounter.   Medications Discontinued During This Encounter  Medication Reason   amLODipine-benazepril (LOTREL) 5-10 MG capsule Change in therapy    Recommendations:   Joseph E Tartaglione  is a 47 y.o. Caucasian  male patient with past medical history of paroxysmal atrial fibrillation and cardioversion on 01/17/2020 and 07/01/2020, hypertension, morbid obesity, obstructive sleep apnea on CPAP, tobacco use disorder still vaping, history of acute deep vein thrombosis and pulmonary embolism on 04/08/2015 following gastric sleeve resection and again presented with spontaneous right leg DVT on 03/07/2019 presently on long term Xarelto.  Due to multivessel coronary disease underwent CABG x 2 and LAA ligation on 06/27/2020.  1. Coronary artery disease involving native coronary artery of native heart with other form of angina pectoris [facility]) He presents here for 42-monthoffice visit, fortunately has not had any recurrence of angina pectoris or heart failure.  Continue present medications.  I reviewed his labs, CBC is stable, no GI bleed, hence we will continue with Xarelto for hypercoagulable state and also continue aspirin 81 mg daily.  2. Primary hypertension On his last office visit 2 months ago, blood pressure was elevated and  had added 5 mg amlodipine to benazepril  low-dose.  He is also on metoprolol 100 mg twice daily.  Patient could not tolerate amlodipine due to marked fatigue and dizziness.  His blood pressure today is normal on physical exam.  Hence we will continue present benazepril low-dose.  3. Paroxysmal atrial fibrillation ([place]) He is extremely symptomatic with atrial fibrillation recurrence.  He is presently on amiodarone 100 mg daily and is maintaining sinus rhythm.  Although he is only 47 years of age, given his severely symptomatic A-fib presentation, ongoing OSA on CPAP, morbid obesity, hypertension, will continue with amiodarone for now.  Will check TSH along with lipid profile testing for drug monitoring (this was ordered on his last office visit but patient did not get this done, he is willing to get this done in the next couple days).  4.  Hypovitaminosis D He is presently on 2000 units of vitamin D which had recommended that he be on in view of low vitamin D levels, will check levels.  Patient was a cigarette smoker previous to CABG, he is still using vape.  We discussed regarding complete abstinence from nicotine products.  Patient has not seen his PCP, I encouraged him to make an appointment.  Patient states that his insurance is not accepted at his PCPs office. OV in 6 months.   Adrian Prows, PA-C 12/10/2022, 9:40 PM Office: [phone]

## 2023-01-02 ENCOUNTER — Other Ambulatory Visit: Payer: Self-pay | Admitting: Cardiology

## 2023-01-02 DIAGNOSIS — E7800 Pure hypercholesterolemia, unspecified: Secondary | ICD-10-CM

## 2023-01-02 DIAGNOSIS — I25118 Atherosclerotic heart disease of native coronary artery with other forms of angina pectoris: Secondary | ICD-10-CM

## 2023-02-16 DIAGNOSIS — I10 Essential (primary) hypertension: Secondary | ICD-10-CM | POA: Diagnosis not present

## 2023-02-16 DIAGNOSIS — R42 Dizziness and giddiness: Secondary | ICD-10-CM | POA: Diagnosis not present

## 2023-02-18 ENCOUNTER — Emergency Department (HOSPITAL_COMMUNITY): Payer: Medicaid Other

## 2023-02-18 ENCOUNTER — Emergency Department (HOSPITAL_COMMUNITY)
Admission: EM | Admit: 2023-02-18 | Discharge: 2023-02-18 | Disposition: A | Discharge status: Home / Self Care | Payer: Medicaid Other | Attending: Emergency Medicine | Admitting: Emergency Medicine

## 2023-02-18 ENCOUNTER — Other Ambulatory Visit: Payer: Self-pay

## 2023-02-18 DIAGNOSIS — R42 Dizziness and giddiness: Secondary | ICD-10-CM | POA: Insufficient documentation

## 2023-02-18 DIAGNOSIS — H9313 Tinnitus, bilateral: Secondary | ICD-10-CM | POA: Diagnosis not present

## 2023-02-18 DIAGNOSIS — G4489 Other headache syndrome: Secondary | ICD-10-CM | POA: Diagnosis not present

## 2023-02-18 DIAGNOSIS — Z7982 Long term (current) use of aspirin: Secondary | ICD-10-CM | POA: Diagnosis not present

## 2023-02-18 DIAGNOSIS — I959 Hypotension, unspecified: Secondary | ICD-10-CM | POA: Diagnosis not present

## 2023-02-18 DIAGNOSIS — Z79899 Other long term (current) drug therapy: Secondary | ICD-10-CM | POA: Insufficient documentation

## 2023-02-18 DIAGNOSIS — E860 Dehydration: Secondary | ICD-10-CM | POA: Diagnosis not present

## 2023-02-18 DIAGNOSIS — I10 Essential (primary) hypertension: Secondary | ICD-10-CM | POA: Diagnosis not present

## 2023-02-18 DIAGNOSIS — R29818 Other symptoms and signs involving the nervous system: Secondary | ICD-10-CM | POA: Diagnosis not present

## 2023-02-18 DIAGNOSIS — Z7901 Long term (current) use of anticoagulants: Secondary | ICD-10-CM | POA: Diagnosis not present

## 2023-02-18 DIAGNOSIS — I2510 Atherosclerotic heart disease of native coronary artery without angina pectoris: Secondary | ICD-10-CM | POA: Insufficient documentation

## 2023-02-18 DIAGNOSIS — Z951 Presence of aortocoronary bypass graft: Secondary | ICD-10-CM | POA: Diagnosis not present

## 2023-02-18 LAB — CBC
HCT: 43.7 % (ref 39.0–52.0)
Hemoglobin: 13.9 g/dL (ref 13.0–17.0)
MCH: 27.8 pg (ref 26.0–34.0)
MCHC: 31.8 g/dL (ref 30.0–36.0)
MCV: 87.4 fL (ref 80.0–100.0)
Platelets: 291 10*3/uL (ref 150–400)
RBC: 5 MIL/uL (ref 4.22–5.81)
RDW: 13.4 % (ref 11.5–15.5)
WBC: 8.2 10*3/uL (ref 4.0–10.5)
nRBC: 0 % (ref 0.0–0.2)

## 2023-02-18 LAB — URINALYSIS, ROUTINE W REFLEX MICROSCOPIC
Bacteria, UA: NONE SEEN
Bilirubin Urine: NEGATIVE
Glucose, UA: NEGATIVE mg/dL
Hgb urine dipstick: NEGATIVE
Ketones, ur: NEGATIVE mg/dL
Nitrite: NEGATIVE
Protein, ur: NEGATIVE mg/dL
Specific Gravity, Urine: 1.008 (ref 1.005–1.030)
pH: 6 (ref 5.0–8.0)

## 2023-02-18 LAB — BASIC METABOLIC PANEL
Anion gap: 9 (ref 5–15)
BUN: 9 mg/dL (ref 6–20)
CO2: 23 mmol/L (ref 22–32)
Calcium: 9.4 mg/dL (ref 8.9–10.3)
Chloride: 106 mmol/L (ref 98–111)
Creatinine, Ser: 0.86 mg/dL (ref 0.61–1.24)
GFR, Estimated: >60 mL/min (ref >60)
Glucose, Bld: 114 mg/dL — ABNORMAL HIGH (ref 70–99)
Potassium: 4.7 mmol/L (ref 3.5–5.1)
Sodium: 138 mmol/L (ref 135–145)

## 2023-02-18 LAB — CBG MONITORING, ED: Glucose-Capillary: 127 mg/dL — ABNORMAL HIGH (ref 70–99)

## 2023-02-18 NOTE — ED Provider Notes (Signed)
[place] EMERGENCY DEPARTMENT AT [facility] Provider Note   CSN: [id number] Arrival date & time: 02/18/23  1549     History  Chief Complaint  Patient presents with   Dizziness   Headache    Joseph Lang is a 47 y.o. male. 47 year old male with history of CAD status post CABG, GERD, prior DVT on anticoagulation, prior PE, hypertension presenting for dizziness.  Patient states since Saturday he has had intermittent episodes where he feels like he is going to pass out.  Usually it is associate with movement such as rolling over or standing up too quickly.  The episodes are brief.  He has not had any episodes of syncope.  He had some headache couple days ago but none currently.  No fevers or chills.  No vomiting or diarrhea.  He has been drinking plenty of fluids with seems to have helped some.  He has associated tinnitus in his right ear.  He has not had any ear pain.  He had vertigo before and feels like this is different.  He has no chest pain or difficulty breathing or palpitations.  No lower extremity edema.  He is adherent to his anticoagulation and other medications.  Has not had any recent change in medications.  Currently he is asymptomatic.  Dizziness Associated symptoms: headaches   Headache Associated symptoms: dizziness        Home Medications Prior to Admission medications   Medication Sig Start Date End Date Taking? Authorizing Provider  amiodarone (PACERONE) 200 MG tablet Take 0.5 tablets (100 mg total) by mouth daily. Patient taking differently: Take 100 mg by mouth 2 (two) times daily. 01/03/21   Yates Decamp, MD  aspirin EC 81 MG tablet Take 1 tablet (81 mg total) by mouth daily. 04/23/20   Yates Decamp, MD  atorvastatin (LIPITOR) 80 MG tablet TAKE 1 TABLET BY MOUTH DAILY 01/02/23   Yates Decamp, MD  benazepril (LOTENSIN) 10 MG tablet Take 10 mg by mouth daily. 11/07/22   [provider]  Cholecalciferol (VITAMIN D3) 75 MCG (3000 UT) TABS Take 1 tablet by  mouth daily.    [provider]  isosorbide mononitrate (IMDUR) 30 MG 24 hr tablet Take 0.5 tablets by mouth daily. 06/13/21   [provider]  metoprolol tartrate (LOPRESSOR) 100 MG tablet Take 1 tablet (100 mg total) by mouth 2 (two) times daily. Patient taking differently: Take 0.5 tablets by mouth 2 (two) times daily. 11/14/21   Cantwell, Celeste C, PA-C  RABEprazole (ACIPHEX) 20 MG tablet Take 20 mg by mouth daily.  01/04/20   [provider]  XARELTO 20 MG TABS tablet Take 1 tablet (20 mg total) by mouth daily. 02/22/22   Yates Decamp, MD      Allergies    Amoxicillin    Review of Systems   Review of Systems  Neurological:  Positive for dizziness, light-headedness and headaches.    Physical Exam Updated Vital Signs BP 125/76   Pulse 60   Temp 98.6 F (37 C) (Oral)   Resp 18   Ht 6\' 2"  (1.88 m)   Wt (!) 195 kg   SpO2 99%   BMI 55.21 kg/m  Physical Exam Vitals and nursing note reviewed.  Constitutional:      General: He is not in acute distress.    Appearance: He is well-developed.  HENT:     Head: Normocephalic and atraumatic.     Mouth/Throat:     Mouth: Mucous membranes are moist.  Pharynx: Oropharynx is clear.  Eyes:     General: No visual field deficit.    Extraocular Movements: Extraocular movements intact.     Conjunctiva/sclera: Conjunctivae normal.     Pupils: Pupils are equal, round, and reactive to light.  Cardiovascular:     Rate and Rhythm: Normal rate and regular rhythm.     Heart sounds: No murmur heard. Pulmonary:     Effort: Pulmonary effort is normal. No respiratory distress.     Breath sounds: Normal breath sounds.  Abdominal:     Palpations: Abdomen is soft.     Tenderness: There is no abdominal tenderness.  Musculoskeletal:        General: No swelling.     Cervical back: Normal range of motion and neck supple. No rigidity.  Skin:    General: Skin is warm and dry.     Capillary Refill: Capillary refill takes less  than 2 seconds.  Neurological:     Mental Status: He is alert and oriented to person, place, and time.     GCS: GCS eye subscore is 4. GCS verbal subscore is 5. GCS motor subscore is 6.     Cranial Nerves: No cranial nerve deficit, dysarthria or facial asymmetry.     Sensory: No sensory deficit.     Motor: No weakness.     Coordination: Romberg sign negative. Coordination normal.     Gait: Gait normal.     Deep Tendon Reflexes: Reflexes normal.  Psychiatric:        Mood and Affect: Mood normal.        Speech: Speech normal.        Behavior: Behavior normal.     ED Results / Procedures / Treatments   Labs (all labs ordered are listed, but only abnormal results are displayed) Labs Reviewed  BASIC METABOLIC PANEL - Abnormal; Notable for the following components:      Result Value   Glucose, Bld 114 (*)    All other components within normal limits  URINALYSIS, ROUTINE W REFLEX MICROSCOPIC - Abnormal; Notable for the following components:   Leukocytes,Ua SMALL (*)    All other components within normal limits  CBG MONITORING, ED - Abnormal; Notable for the following components:   Glucose-Capillary 127 (*)    All other components within normal limits  CBC    EKG None  Radiology CT Head Wo Contrast  Result Date: 02/18/2023 CLINICAL DATA:  Neuro deficit, stroke suspected EXAM: CT HEAD WITHOUT CONTRAST TECHNIQUE: Contiguous axial images were obtained from the base of the skull through the vertex without intravenous contrast. RADIATION DOSE REDUCTION: This exam was performed according to the departmental dose-optimization program which includes automated exposure control, adjustment of the mA and/or kV according to patient size and/or use of iterative reconstruction technique. COMPARISON:  11/02/2015 FINDINGS: Brain: No evidence of acute infarction, hemorrhage, hydrocephalus, extra-axial collection or mass lesion/mass effect. Vascular: No hyperdense vessel or unexpected calcification.  Skull: Normal. Negative for fracture or focal lesion. Sinuses/Orbits: No acute finding. Other: None. IMPRESSION: No acute intracranial pathology. Electronically Signed   By: Jearld Lesch M.D.   On: 02/18/2023 17:55    Procedures Procedures    Medications Ordered in ED Medications - No data to display  ED Course/ Medical Decision Making/ A&P Clinical Course as of 02/19/23 1214  Mon Feb 18, 2023  1858 EKG normal sinus rhythm, normal intervals, no significant ST or T wave abnormalities. [JD]    Clinical Course User Index [JD] Fulton Reek,  MD                             Medical Decision Making Amount and/or Complexity of Data Reviewed Labs: ordered.   47 year old male significant past medical history presenting for several days of intermittent presyncope.  Vital signs reviewed.  Patient is well-appearing, currently is asymptomatic.  He describes episodes of presyncope associated with changes in position concerning for possible orthostasis.  He is also had some tinnitus which is new as well.  Differential including orthostatic hypotension, relative bradycardia given his beta-blocker use, dehydration, electrolyte abnormality, vertigo.  He has normal neurologic exam, I do not see any signs of CVA or TIA.  His EKG does not show any signs of ischemia or arrhythmia, I do not think this is an anginal equivalent.  Clinically appears relatively well-hydrated.  I did offer him IV fluids to see if this helps which she declined.  I considered PE given his history, however he has no tachycardia, hypoxia, shortness of breath, chest pain and no DVT symptoms.  His lab work here is reassuring.  No significant electrolyte abnormalities.  CBC is unremarkable.  Urinalysis unremarkable as well.  CT head Reviewed, no signs of acute intracranial abnormality.  Patient states he would like to go home so that he can get a ride home.  I think this is reasonable given resolution of symptoms and reassuring workup and  exam.  He was given follow-up for neurology and ENT and I recommend he follow-up closely with his primary care provider as well.  Given strict return precautions for any new or worsening symptoms including syncope, chest pain, difficulty breathing.        Final Clinical Impression(s) / ED Diagnoses Final diagnoses:  Tinnitus of both ears  Dizziness    Rx / DC Orders ED Discharge Orders          Ordered    Ambulatory referral to Neurology       Comments: An appointment is requested in approximately: 2 weeks   02/18/23 Tora Kindred, MD 02/19/23 1214    Derwood Kaplan, MD 02/22/23 1227

## 2023-02-18 NOTE — ED Provider Triage Note (Signed)
Emergency Medicine Provider Triage Evaluation Note  Joseph Lang , a 47 y.o. male  was evaluated in triage.  Patient complains of multiple episodes of near syncope that started today.  He reports that sometimes he has tenderness bilaterally but intermittently he feels like he is going to lose consciousness but is having difficulty describing the feeling.  Denies dizziness and says this does not feel like previous history of vertigo.  Reports familial history of carotid artery plaques and he is wondering if it could be coming from there Review of Systems  Positive:  Negative:   Physical Exam  BP (!) 143/75 (BP Location: Right Arm)   Pulse 69   Temp 98.2 F (36.8 C) (Oral)   Resp 16   Ht 6\' 2"  (1.88 m)   Wt (!) 195 kg   SpO2 95%   BMI 55.21 kg/m  Gen:   Awake, no distress   Resp:  Normal effort  MSK:   Moves extremities without difficulty  Other:  Alert and oriented and well-appearing.    Medical Decision Making  Medically screening exam initiated at 4:13 PM.  Appropriate orders placed.  Joseph Lang was informed that the remainder of the evaluation will be completed by another provider, this initial triage assessment does not replace that evaluation, and the importance of remaining in the ED until their evaluation is complete.     Saddie Benders, PA-C 02/18/23 1614

## 2023-02-18 NOTE — ED Triage Notes (Signed)
Since Saturday, pt has intermittent headache and feeling "off kilter." Patient denies dizziness, lightheadedness, and states nothing is spinning but he intermittently feels like he's going to black out.

## 2023-02-18 NOTE — Discharge Instructions (Addendum)
The workup in the emergency room is reassuring.  However, we recommend that you follow-up with neurologist and the ear nose and throat doctors for additional workup.  We recommend that you follow-up with the neurologist first.  If they clear you, then you see the ENT doctor (Dr. Jearld Fenton).  Return to the emergency room if you start having worsening dizziness, unsteady gait, near fainting or fainting spell, double vision, one-sided weakness or numbness or slurred speech.

## 2023-02-28 ENCOUNTER — Emergency Department (HOSPITAL_COMMUNITY)
Admission: EM | Admit: 2023-02-28 | Discharge: 2023-03-01 | Disposition: A | Discharge status: Home / Self Care | Payer: Medicaid Other | Attending: Emergency Medicine | Admitting: Emergency Medicine

## 2023-02-28 ENCOUNTER — Other Ambulatory Visit: Payer: Self-pay

## 2023-02-28 ENCOUNTER — Encounter (HOSPITAL_COMMUNITY): Payer: Self-pay

## 2023-02-28 DIAGNOSIS — I4891 Unspecified atrial fibrillation: Secondary | ICD-10-CM | POA: Insufficient documentation

## 2023-02-28 DIAGNOSIS — R42 Dizziness and giddiness: Secondary | ICD-10-CM | POA: Diagnosis not present

## 2023-02-28 DIAGNOSIS — Z7901 Long term (current) use of anticoagulants: Secondary | ICD-10-CM | POA: Diagnosis not present

## 2023-02-28 DIAGNOSIS — Z7982 Long term (current) use of aspirin: Secondary | ICD-10-CM | POA: Diagnosis not present

## 2023-02-28 DIAGNOSIS — Z79899 Other long term (current) drug therapy: Secondary | ICD-10-CM | POA: Diagnosis not present

## 2023-02-28 DIAGNOSIS — Z951 Presence of aortocoronary bypass graft: Secondary | ICD-10-CM | POA: Diagnosis not present

## 2023-02-28 DIAGNOSIS — I2510 Atherosclerotic heart disease of native coronary artery without angina pectoris: Secondary | ICD-10-CM | POA: Diagnosis not present

## 2023-02-28 DIAGNOSIS — R519 Headache, unspecified: Secondary | ICD-10-CM | POA: Diagnosis not present

## 2023-02-28 DIAGNOSIS — R509 Fever, unspecified: Secondary | ICD-10-CM | POA: Diagnosis not present

## 2023-02-28 DIAGNOSIS — I6523 Occlusion and stenosis of bilateral carotid arteries: Secondary | ICD-10-CM | POA: Diagnosis not present

## 2023-02-28 LAB — CBC
HCT: 43.7 % (ref 39.0–52.0)
Hemoglobin: 13.9 g/dL (ref 13.0–17.0)
MCH: 27.7 pg (ref 26.0–34.0)
MCHC: 31.8 g/dL (ref 30.0–36.0)
MCV: 87.2 fL (ref 80.0–100.0)
Platelets: 292 10*3/uL (ref 150–400)
RBC: 5.01 MIL/uL (ref 4.22–5.81)
RDW: 13.7 % (ref 11.5–15.5)
WBC: 9.4 10*3/uL (ref 4.0–10.5)
nRBC: 0 % (ref 0.0–0.2)

## 2023-02-28 LAB — TROPONIN I (HIGH SENSITIVITY): Troponin I (High Sensitivity): 6 ng/L (ref <18)

## 2023-02-28 LAB — BASIC METABOLIC PANEL
Anion gap: 8 (ref 5–15)
BUN: 11 mg/dL (ref 6–20)
CO2: 23 mmol/L (ref 22–32)
Calcium: 9.4 mg/dL (ref 8.9–10.3)
Chloride: 109 mmol/L (ref 98–111)
Creatinine, Ser: 0.81 mg/dL (ref 0.61–1.24)
GFR, Estimated: >60 mL/min (ref >60)
Glucose, Bld: 113 mg/dL — ABNORMAL HIGH (ref 70–99)
Potassium: 4 mmol/L (ref 3.5–5.1)
Sodium: 140 mmol/L (ref 135–145)

## 2023-02-28 LAB — HIV ANTIBODY (ROUTINE TESTING W REFLEX): HIV Screen 4th Generation wRfx: NONREACTIVE

## 2023-02-28 NOTE — ED Provider Triage Note (Signed)
Emergency Medicine Provider Triage Evaluation Note  Joseph Lang , a 47 y.o. male  was evaluated in triage.  Pt complains of knot on right posterior head.  Patient states that he was seen about a week and a half ago for weakness and feelings of faintness with accompanying headache.  Describes headache as originating in neck with radiation to posterior head.  Patient states that he noticed an area on right posterior head today after getting a haircut.  Noticed subsequent pain with radiation to right jaw.  Denies visual service, gait abnormality, slurred speech, facial droop, weakness or sensory deficits in upper extremities.  Denies chest pain, shortness of breath, abdominal pain pain, nausea, vomiting.  Review of Systems  Positive: See above Negative:   Physical Exam  BP 122/64 (BP Location: Right Arm)   Pulse 62   Temp 99.3 F (37.4 C) (Oral)   Resp 16   SpO2 98%  Gen:   Awake, no distress   Resp:  Normal effort  MSK:   Moves extremities without difficulty  Other:  Cranial nerves III through XII grossly intact Medical Decision Making  Medically screening exam initiated at 6:40 PM.  Appropriate orders placed.  Joseph Lang was informed that the remainder of the evaluation will be completed by another provider, this initial triage assessment does not replace that evaluation, and the importance of remaining in the ED until their evaluation is complete.     Peter Garter, [place] 02/28/23 1843

## 2023-02-28 NOTE — ED Triage Notes (Signed)
Pt came in via POV d/t a knot that he noticed had developed to the back of his neck to the Rt side. He was seen "week in a half ago" for feeling faint, was seen here in this ED & clear with all TX given He has since had Temps as high as 102 at night.. He cannot wait till July to see his Neurologist & was supposed to see the ENT today but then he noticed the knot on his neck. He is now having intermittent back pain, Rt jaw pain & a 8/10 HA. Does take thinners & continues to have "faint feelings."

## 2023-03-01 ENCOUNTER — Emergency Department (HOSPITAL_COMMUNITY): Payer: Medicaid Other

## 2023-03-01 DIAGNOSIS — R509 Fever, unspecified: Secondary | ICD-10-CM | POA: Diagnosis not present

## 2023-03-01 DIAGNOSIS — I6523 Occlusion and stenosis of bilateral carotid arteries: Secondary | ICD-10-CM | POA: Diagnosis not present

## 2023-03-01 LAB — HEPATIC FUNCTION PANEL
ALT: 35 U/L (ref 0–44)
AST: 26 U/L (ref 15–41)
Albumin: 3.3 g/dL — ABNORMAL LOW (ref 3.5–5.0)
Alkaline Phosphatase: 68 U/L (ref 38–126)
Bilirubin, Direct: 0.2 mg/dL (ref 0.0–0.2)
Indirect Bilirubin: 0.6 mg/dL (ref 0.3–0.9)
Total Bilirubin: 0.8 mg/dL (ref 0.3–1.2)
Total Protein: 6.5 g/dL (ref 6.5–8.1)

## 2023-03-01 MED ORDER — IOHEXOL 350 MG/ML SOLN
75.0000 mL | Freq: Once | INTRAVENOUS | Status: AC | PRN
  Administered 2023-03-01: 75 mL via INTRAVENOUS

## 2023-03-01 NOTE — ED Provider Notes (Signed)
[place] EMERGENCY DEPARTMENT AT [facility] Provider Note   CSN: [id number] Arrival date & time: 02/28/23  1634     History  Chief Complaint  Patient presents with   Knot to posterior/Rt neck   Faint feeling    Joseph Lang is a 47 y.o. male.  The history is provided by the patient and medical records.  Joseph Lang is a 47 y.o. male who presents to the Emergency Department complaining of headache.  He presents to the emergency department for evaluation of sudden onset right-sided headache that started today.  He states that about 2 and half weeks ago he developed symptoms of intermittent dizziness that was for the most part positional in nature.  He was seen in the ER a week and a half ago and had a CT scan and labs performed at that time, which were unremarkable.  He states that since his ER visit he has developed fever nightly.  His fevers have been 100-101 at home.  He has had 1 episode of night sweats.  Last night he did not have a fever but today he developed a severe right-sided headache that is in his right posterior neck to the top of his head.  He also feels swelling to the right posterior neck, which started today.  No reported trauma.  No chest pain, difficulty breathing, nausea, vomiting.  He has experienced 1 week of diarrhea.  He has a history of coronary artery disease status post CABG, PE on Xarelto, A-fib, gastric bypass.     Home Medications Prior to Admission medications   Medication Sig Start Date End Date Taking? Authorizing Provider  amiodarone (PACERONE) 200 MG tablet Take 0.5 tablets (100 mg total) by mouth daily. Patient taking differently: Take 100 mg by mouth 2 (two) times daily. 01/03/21   Yates Decamp, MD  aspirin EC 81 MG tablet Take 1 tablet (81 mg total) by mouth daily. 04/23/20   Yates Decamp, MD  atorvastatin (LIPITOR) 80 MG tablet TAKE 1 TABLET BY MOUTH DAILY 01/02/23   Yates Decamp, MD  benazepril (LOTENSIN) 10 MG tablet Take 10 mg by mouth  daily. 11/07/22   [provider]  Cholecalciferol (VITAMIN D3) 75 MCG (3000 UT) TABS Take 1 tablet by mouth daily.    [provider]  isosorbide mononitrate (IMDUR) 30 MG 24 hr tablet Take 0.5 tablets by mouth daily. 06/13/21   [provider]  metoprolol tartrate (LOPRESSOR) 100 MG tablet Take 1 tablet (100 mg total) by mouth 2 (two) times daily. Patient taking differently: Take 0.5 tablets by mouth 2 (two) times daily. 11/14/21   Cantwell, Celeste C, PA-C  RABEprazole (ACIPHEX) 20 MG tablet Take 20 mg by mouth daily.  01/04/20   [provider]  XARELTO 20 MG TABS tablet Take 1 tablet (20 mg total) by mouth daily. 02/22/22   Yates Decamp, MD      Allergies    Amoxicillin    Review of Systems   Review of Systems  All other systems reviewed and are negative.   Physical Exam Updated Vital Signs BP (!) 140/72 (BP Location: Right Arm)   Pulse 75   Temp 98 F (36.7 C) (Oral)   Resp 20   SpO2 99%  Physical Exam Vitals and nursing note reviewed.  Constitutional:      Appearance: He is well-developed.  HENT:     Head: Normocephalic and atraumatic.     Comments: There is firm swelling to the right occipital  scalp/mastoid region that is nontender to palpation.  No overlying erythema.  No appreciable anterior cervical lymphadenopathy.    Right Ear: Tympanic membrane normal.     Left Ear: Tympanic membrane normal.  Cardiovascular:     Rate and Rhythm: Normal rate and regular rhythm.     Heart sounds: No murmur heard. Pulmonary:     Effort: Pulmonary effort is normal. No respiratory distress.     Breath sounds: Normal breath sounds.  Abdominal:     Palpations: Abdomen is soft.     Tenderness: There is no abdominal tenderness. There is no guarding or rebound.  Musculoskeletal:        General: No tenderness.     Cervical back: Neck supple.  Skin:    General: Skin is warm and dry.     Capillary Refill: Capillary refill takes less than 2 seconds.   Neurological:     Mental Status: He is alert and oriented to person, place, and time.     Comments: No asymmetry of facial movements.  5 out of 5 strength in all 4 extremities.  Psychiatric:        Behavior: Behavior normal.     ED Results / Procedures / Treatments   Labs (all labs ordered are listed, but only abnormal results are displayed) Labs Reviewed  BASIC METABOLIC PANEL - Abnormal; Notable for the following components:      Result Value   Glucose, Bld 113 (*)    All other components within normal limits  HEPATIC FUNCTION PANEL - Abnormal; Notable for the following components:   Albumin 3.3 (*)    All other components within normal limits  CBC  HIV ANTIBODY (ROUTINE TESTING W REFLEX)  URINALYSIS, ROUTINE W REFLEX MICROSCOPIC  TROPONIN I (HIGH SENSITIVITY)  TROPONIN I (HIGH SENSITIVITY)    EKG EKG Interpretation  Date/Time:  Thursday Feb 28 2023 19:00:43 EDT Ventricular Rate:  60 PR Interval:  172 QRS Duration: 88 QT Interval:  392 QTC Calculation: 392 R Axis:   97 Text Interpretation: Normal sinus rhythm Rightward axis Cannot rule out Anterior infarct , age undetermined Abnormal ECG Confirmed by Tilden Fossa [phone]) on 03/01/2023 1:04:39 AM  Radiology CT ANGIO HEAD NECK W WO CM  Result Date: 03/01/2023 CLINICAL DATA:  Initial evaluation for acute headache. EXAM: CT ANGIOGRAPHY HEAD AND NECK WITH AND WITHOUT CONTRAST TECHNIQUE: Multidetector CT imaging of the head and neck was performed using the standard protocol during bolus administration of intravenous contrast. Multiplanar CT image reconstructions and MIPs were obtained to evaluate the vascular anatomy. Carotid stenosis measurements (when applicable) are obtained utilizing NASCET criteria, using the distal internal carotid diameter as the denominator. RADIATION DOSE REDUCTION: This exam was performed according to the departmental dose-optimization program which includes automated exposure control, adjustment of  the mA and/or kV according to patient size and/or use of iterative reconstruction technique. CONTRAST:  75mL OMNIPAQUE IOHEXOL 350 MG/ML SOLN COMPARISON:  Prior study from 02/18/2023. FINDINGS: CT HEAD FINDINGS Brain: Cerebral volume within normal limits for patient age. No evidence for acute intracranial hemorrhage. No findings to suggest acute large vessel territory infarct. No mass lesion, midline shift, or mass effect. Ventricles are normal in size without evidence for hydrocephalus. No extra-axial fluid collection identified. Vascular: No hyperdense vessel identified. Skull: Scalp soft tissues demonstrate no acute abnormality. Calvarium intact. Sinuses/Orbits: Globes and orbital soft tissues within normal limits. Visualized paranasal sinuses are clear. No mastoid effusion. CTA NECK FINDINGS Aortic arch: Normal caliber with standard branch pattern. Sequelae  of prior CABG. Mild atheromatous change about the arch itself. No stenosis about the origin of the great vessels. Right carotid system: Right common and internal carotid arteries are patent without dissection. Atheromatous change about the right carotid bulb without hemodynamically significant greater than 50% stenosis. Left carotid system: Left common and internal carotid arteries are patent without dissection. Mild-to-moderate atheromatous change about the left carotid bulb without hemodynamically significant greater than 50% stenosis. Vertebral arteries: Both vertebral arteries arise from subclavian arteries. No proximal subclavian artery stenosis. Left vertebral artery dominant. Atheromatous plaque at the origins of both vertebral arteries with estimated moderate ostial stenoses. Vertebral arteries otherwise patent without stenosis or dissection. Skeleton: No discrete or worrisome osseous lesions. Mild spondylosis present at C6-7. Prior sternotomy noted. Other neck: No other acute finding. Upper chest: No other acute finding. Review of the MIP images  confirms the above findings CTA HEAD FINDINGS Anterior circulation: Atheromatous change about the carotid siphons with up to moderate stenosis at the para clinoid segments bilaterally. A1 segments, anterior communicating complex common anterior cerebral arteries widely patent. No M1 stenosis or occlusion. No proximal MCA branch occlusion or high-grade stenosis. Distal MCA branches perfused and symmetric. Posterior circulation: Both V4 segments patent without significant stenosis. Neither PICA origin well visualized. Basilar patent without stenosis. Superior cerebellar and posterior cerebral arteries patent bilaterally. Venous sinuses: Grossly patent allowing for timing the contrast bolus. Anatomic variants: Partially azygous ACA. Dominant left vertebral artery. No intracranial aneurysm. Review of the MIP images confirms the above findings IMPRESSION: CT HEAD IMPRESSION: No acute intracranial abnormality. CTA HEAD AND NECK IMPRESSION: 1. Negative CTA for large vessel occlusion or other emergent finding. No aneurysm. 2. Atheromatous change about the carotid siphons with associated moderate stenoses about the paraclinoid segments bilaterally. 3. Atheromatous plaque at the origins of both vertebral arteries with estimated moderate ostial stenoses. Left vertebral artery dominant. Electronically Signed   By: Rise Mu M.D.   On: 03/01/2023 02:32   DG Chest 2 View  Result Date: 03/01/2023 CLINICAL DATA:  Fever EXAM: CHEST - 2 VIEW COMPARISON:  None Available. FINDINGS: Status post coronary artery bypass grafting and left atrial clipping. The heart size and mediastinal contours are within normal limits. Both lungs are clear. The visualized skeletal structures are age-appropriate. IMPRESSION: No active cardiopulmonary disease. Electronically Signed   By: Helyn Numbers M.D.   On: 03/01/2023 00:50    Procedures Procedures    Medications Ordered in ED Medications  iohexol (OMNIPAQUE) 350 MG/ML injection  75 mL (75 mLs Intravenous Contrast Given 03/01/23 0106)    ED Course/ Medical Decision Making/ A&P                             Medical Decision Making Amount and/or Complexity of Data Reviewed Labs: ordered. Radiology: ordered.  Risk Prescription drug management.   Patient here for evaluation of intermittent dizziness for 2-1/2 weeks as well as headache that started today with right-sided posterior neck/scalp swelling that was noticed today.  He has experienced fevers for the last week, no fever for the last 24 hours.  He is nontoxic-appearing on evaluation with no focal neurologic deficits.  Labs are reassuring with normal CBC, no significant electrolyte abnormalities.  Patient does not have any active chest pain, current clinical picture is not consistent with arrhythmia or ACS.  Current clinical picture is not consistent with meningitis.  Chest x-ray is negative for acute abnormality, doubt pneumonia.  CTA head and  neck were obtained, CTA does demonstrate atherosclerosis, no evidence of aneurysm.  Palpated area of fullness on the posterior scalp and neck is not appreciated on CT scan-suspect this is secondary to his body habitus.  There is no evidence of soft tissue infection or otitis media on examination.  Discussed with patient unclear source of symptoms.  Feel he is stable for discharge home with outpatient follow-up and return precautions.        Final Clinical Impression(s) / ED Diagnoses Final diagnoses:  Dizziness  Bad headache    Rx / DC Orders ED Discharge Orders     None         Tilden Fossa, MD 03/01/23 [phone]

## 2023-03-13 ENCOUNTER — Other Ambulatory Visit: Payer: Self-pay

## 2023-03-13 MED ORDER — BENAZEPRIL HCL 10 MG PO TABS: 10.0000 mg | ORAL_TABLET | Freq: Every day | ORAL | 3 refills | Status: DC

## 2023-03-14 DIAGNOSIS — N3000 Acute cystitis without hematuria: Secondary | ICD-10-CM | POA: Diagnosis not present

## 2023-03-14 DIAGNOSIS — N476 Balanoposthitis: Secondary | ICD-10-CM | POA: Diagnosis not present

## 2023-04-25 ENCOUNTER — Ambulatory Visit: Payer: Medicaid Other | Admitting: Neurology

## 2023-05-07 ENCOUNTER — Other Ambulatory Visit: Payer: Self-pay

## 2023-05-07 ENCOUNTER — Other Ambulatory Visit: Payer: Self-pay | Admitting: Cardiology

## 2023-05-07 DIAGNOSIS — I25118 Atherosclerotic heart disease of native coronary artery with other forms of angina pectoris: Secondary | ICD-10-CM

## 2023-05-07 DIAGNOSIS — I480 Paroxysmal atrial fibrillation: Secondary | ICD-10-CM

## 2023-06-10 ENCOUNTER — Ambulatory Visit: Payer: Medicaid Other | Admitting: Cardiology

## 2023-06-10 ENCOUNTER — Encounter: Payer: Self-pay | Admitting: Cardiology

## 2023-06-10 ENCOUNTER — Other Ambulatory Visit: Payer: Self-pay

## 2023-06-10 VITALS — BP 130/78 | HR 88 | Ht 74.0 in | Wt >= 6400 oz

## 2023-06-10 DIAGNOSIS — I480 Paroxysmal atrial fibrillation: Secondary | ICD-10-CM

## 2023-06-10 DIAGNOSIS — G4733 Obstructive sleep apnea (adult) (pediatric): Secondary | ICD-10-CM

## 2023-06-10 DIAGNOSIS — E7800 Pure hypercholesterolemia, unspecified: Secondary | ICD-10-CM

## 2023-06-10 DIAGNOSIS — E66813 Obesity, class 3: Secondary | ICD-10-CM

## 2023-06-10 DIAGNOSIS — F5101 Primary insomnia: Secondary | ICD-10-CM

## 2023-06-10 DIAGNOSIS — I10 Essential (primary) hypertension: Secondary | ICD-10-CM

## 2023-06-10 DIAGNOSIS — I25118 Atherosclerotic heart disease of native coronary artery with other forms of angina pectoris: Secondary | ICD-10-CM

## 2023-06-10 DIAGNOSIS — E559 Vitamin D deficiency, unspecified: Secondary | ICD-10-CM

## 2023-06-10 MED ORDER — METOPROLOL TARTRATE 50 MG PO TABS: 50.0000 mg | ORAL_TABLET | Freq: Two times a day (BID) | ORAL | 3 refills | Status: DC

## 2023-06-10 MED ORDER — AMIODARONE HCL 200 MG PO TABS: 100.0000 mg | ORAL_TABLET | Freq: Every day | ORAL | 3 refills | Status: DC

## 2023-06-10 MED ORDER — BENAZEPRIL HCL 10 MG PO TABS: 10.0000 mg | ORAL_TABLET | Freq: Every day | ORAL | 3 refills | Status: DC

## 2023-06-10 MED ORDER — XARELTO 20 MG PO TABS
20.0000 mg | ORAL_TABLET | Freq: Every day | ORAL | 3 refills | Status: AC
Start: 2023-06-10 — End: ?

## 2023-06-10 MED ORDER — ATORVASTATIN CALCIUM 80 MG PO TABS: 80.0000 mg | ORAL_TABLET | Freq: Every day | ORAL | 3 refills | Status: DC

## 2023-06-10 MED ORDER — TRAZODONE HCL 100 MG PO TABS
100.0000 mg | ORAL_TABLET | Freq: Every day | ORAL | 1 refills | Status: DC
Start: 2023-06-10 — End: 2023-12-08

## 2023-06-10 MED ORDER — SEMAGLUTIDE(0.25 OR 0.5MG/DOS) 2 MG/3ML ~~LOC~~ SOPN
0.2500 mg | PEN_INJECTOR | SUBCUTANEOUS | 1 refills | Status: DC
Start: 2023-06-10 — End: 2023-12-08

## 2023-06-10 NOTE — Progress Notes (Signed)
Primary Physician/Referring:  Georgann Housekeeper, MD  Patient ID: Joseph Lang, male    DOB: [birth year], 47 y.o.   MRN: [id number]        DOS: 06/10/23  Chief Complaint  Patient presents with   Coronary Artery Disease   Hypertension   Hyperlipidemia   Atrial Fibrillation   Follow-up   HPI:    Joseph Lang  is a 47 y.o. Caucasian  male patient with past medical history of paroxysmal atrial fibrillation and cardioversion on 01/17/2020 and 07/01/2020, hypertension, morbid obesity, obstructive sleep apnea on CPAP, tobacco use disorder still vaping, history of acute deep vein thrombosis and pulmonary embolism on 04/08/2015 following gastric sleeve resection and again presented with spontaneous right leg DVT on 03/07/2019 presently on long term Xarelto.  Due to multivessel coronary disease underwent CABG x 2 and LAA ligation on 06/27/2020.  He is presently doing well and has no specific complaints except for being tired all the time.  He has been smoking any cigarettes anymore however still vaping. Has quit alcohol.  Past Medical History:  Diagnosis Date   Coronary artery disease 2021   Dysrhythmia 2021   Afib   GERD (gastroesophageal reflux disease)    History of blood clots    Hx of deep vein thrombophlebitis of lower extremity    Hx of gastric bypass    sleeve   Hx of pulmonary embolus    Hypertension    Obesity    Other hyperlipidemia    PONV (postoperative nausea and vomiting)    Throwing up after wt loss surgery   S/P CABG x 2 06/28/2020: LIMA to LAD, Free Radial graft to D1. LAA ligation with 40 mm Atricure. 06/27/2020   Sleep apnea    Swelling of both lower extremities    Tobacco abuse    Social History   Tobacco Use   Smoking status: Former    Current packs/day: 0.00    Average packs/day: 0.5 packs/day for 30.0 years (15.0 ttl pk-yrs)    Types: Cigarettes    Start date: 04/20/1990    Quit date: 04/20/2020    Years since quitting: 3.1   Smokeless tobacco: Never   Tobacco  comments:    half a pack daily  Substance Use Topics   Alcohol use: Not Currently   Marital Status: Single  ROS  Review of Systems  Constitutional: Positive for malaise/fatigue.  Cardiovascular:  Negative for chest pain, dyspnea on exertion and leg swelling.  Respiratory:  Positive for snoring (on CPAP and compliant).    Objective  Blood pressure 130/78, pulse 88, height 6\' 2"  (1.88 m), weight (!) 421 lb (191 kg).     06/10/2023    2:37 PM 03/01/2023    2:50 AM 02/28/2023    9:58 PM  Vitals with BMI  Height 6\' 2"     Weight 421 lbs    BMI 54.03    Systolic 130 140 244  Diastolic 78 72 62  Pulse 88 75 61     Physical Exam Constitutional:      Appearance: He is morbidly obese.  Neck:     Vascular: No carotid bruit or JVD.  Cardiovascular:     Rate and Rhythm: Normal rate and regular rhythm.     Pulses: Intact distal pulses.     Heart sounds: Normal heart sounds. No murmur heard.    No gallop.  Pulmonary:     Effort: Pulmonary effort is normal.     Breath sounds: Normal breath  sounds.  Abdominal:     General: Bowel sounds are normal.     Palpations: Abdomen is soft.  Musculoskeletal:     Right lower leg: No edema.     Left lower leg: No edema.    Laboratory examination:   Lab Results  Component Value Date   NA 140 02/28/2023   K 4.0 02/28/2023   CO2 23 02/28/2023   GLUCOSE 113 (H) 02/28/2023   BUN 11 02/28/2023   CREATININE 0.81 02/28/2023   CALCIUM 9.4 02/28/2023   EGFR 109 12/28/2020   GFRNONAA >60 02/28/2023    Lab Results  Component Value Date   WBC 9.4 02/28/2023   HGB 13.9 02/28/2023   HCT 43.7 02/28/2023   MCV 87.2 02/28/2023   PLT 292 02/28/2023    Lab Results  Component Value Date   CHOL 127 12/28/2020   HDL 47 12/28/2020   LDLCALC 65 12/28/2020   TRIG 75 12/28/2020   CHOLHDL 2.7 12/28/2020    Last vitamin D Lab Results  Component Value Date   VD25OH 23.0 (L) 12/28/2020    External labs:   Cholesterol, total 127.000 m  12/28/2020 HDL 47.000 mg 12/28/2020 LDL 65.000 mg 12/28/2020 Triglycerides 75.000 mg 12/28/2020  A1C 5.900 % 12/28/2020 TSH 2.050 05/31/2022  Hemoglobin 13.700 g/d 05/31/2022 Platelets 266.000 x1 12/28/2020  Creatinine, Serum 0.750 mg/ 05/31/2022 Potassium 4.500 mm 05/31/2022 ALT (SGPT) 40.000 U/L 05/31/2022  Medications and allergies   Allergies  Allergen Reactions   Amoxicillin Nausea And Vomiting     Current Outpatient Medications:    aspirin EC 81 MG tablet, Take 1 tablet (81 mg total) by mouth daily., Disp: 90 tablet, Rfl: 3   Cholecalciferol (VITAMIN D3) 75 MCG (3000 UT) TABS, Take 1 tablet by mouth daily., Disp: , Rfl:    isosorbide mononitrate (IMDUR) 30 MG 24 hr tablet, Take 0.5 tablets by mouth daily., Disp: , Rfl:    RABEprazole (ACIPHEX) 20 MG tablet, Take 20 mg by mouth daily. , Disp: , Rfl:    Semaglutide,0.25 or 0.5MG /DOS, 2 MG/3ML SOPN, Inject 0.25 mg into the skin once a week., Disp: 3 mL, Rfl: 1   traZODone (DESYREL) 100 MG tablet, Take 1 tablet (100 mg total) by mouth at bedtime., Disp: 30 tablet, Rfl: 1   amiodarone (PACERONE) 200 MG tablet, Take 0.5 tablets (100 mg total) by mouth daily., Disp: 45 tablet, Rfl: 3   atorvastatin (LIPITOR) 80 MG tablet, Take 1 tablet (80 mg total) by mouth daily., Disp: 90 tablet, Rfl: 3   benazepril (LOTENSIN) 10 MG tablet, Take 1 tablet (10 mg total) by mouth daily., Disp: 90 tablet, Rfl: 3   metoprolol tartrate (LOPRESSOR) 50 MG tablet, Take 1 tablet (50 mg total) by mouth 2 (two) times daily., Disp: 180 tablet, Rfl: 3   XARELTO 20 MG TABS tablet, Take 1 tablet (20 mg total) by mouth daily with supper., Disp: 90 tablet, Rfl: 3   Radiology:   PORTABLE CHEST 1 VIEW 01/17/2020  COMPARISON:  04/08/2015 Cardiac silhouette is normal in size. No mediastinal or hilar masses. No evidence of adenopathy. Lungs are clear.  No pleural effusion or pneumothorax. Skeletal structures are grossly intact.  Cardiac Studies:    Echocardiogram: 02/29/2020: LVEF 55-60%, mildly dilated left ventricular cavity, mild LVH, normal diastolic filling pattern, normal left atrial pressure, mildly dilated left atrium, mild MR, trace TR.  Stress Testing: Lexiscan Sestamibi stress test 02/29/2020: Stress EKG at 78% MPHR shows sinus tachycardia, nonspecific T wave inversion in inferior leads, frequent  PVC's.  SPECT images show large sized, medium intensity, predominantly reversible perfusion defect in basal to apical, inferoseptal, inferior, and inferolateral myocadium. While study quality may be limited due to attenuation (BMI 52), ischemia cannot be excluded. Stress LVEF 65%. Recommend clinical correlation. High risk study.   Vascular imaging: Ultrasound venous duplex: 03/07/2019: Right: Findings consistent with acute deep vein thrombosis involving the right popliteal vein. There is evidence of superficial vein thrombosis involving a varicose vein of the proximal, medial, right calf.  Left: No evidence of common femoral vein obstruction.  Left Heart Catheterization 04/19/20:  LV: Moderately elevated LVEDP.  LM: Mid 40%. Hazy. Severe diffuse multivessel disease. Ectasia of the LAD, RCA. Prox LAD aneurysmal. Occluded after origin of a very large D1. D1 is very large with mid 90% stenosis. Ipsilateral and contralateral (RCA) to mid LAD noted.  Cx: Prox 70-80% stenosis involves the bifurcation of large OM-1. At the bifurcation there is at least a 80% stenosis. RCA: Diffusely ectatic. PL branch has a focal 50% stenosis.  Slow flow noted in the RCA.    Rec: Complex multivessel CAD and will benefit from CABG to LAD and D1 and OM1 with arterial conduits. Maze and LAA ligation. Patient went into atrial fibrillation in the cath lab. 70 mL contrast used. IV dilt and metoprolol given but did not convert to sinus. He will resume Xarelto tonight. Patient wants to come in and discuss personally at the office. Not happy about CABG  potential.  EKG:   EKG 06/10/2023: Normal sinus rhythm at rate of 53 bpm, normal axis, poor R progression, cannot exclude anteroseptal infarct old.  Low-voltage complexes.  Compared to 12/10/2022, no significant change.  Assessment     ICD-10-CM   1. Coronary artery disease involving native coronary artery of native heart with other form of angina pectoris (HCC)  I25.118 EKG 12-Lead    metoprolol tartrate (LOPRESSOR) 50 MG tablet    Semaglutide,0.25 or 0.5MG /DOS, 2 MG/3ML SOPN    2. Primary hypertension  I10 benazepril (LOTENSIN) 10 MG tablet    CMP14+EGFR    CBC    3. Hypercholesteremia  E78.00 atorvastatin (LIPITOR) 80 MG tablet    Lipid Panel With LDL/HDL Ratio    4. Paroxysmal atrial fibrillation (HCC). CHA2DS2-VASc Score is 2.  Yearly risk of stroke: 2.3% (CAD, HTN).   I48.0 amiodarone (PACERONE) 200 MG tablet    XARELTO 20 MG TABS tablet    metoprolol tartrate (LOPRESSOR) 50 MG tablet    5. Hypovitaminosis D  E55.9 VITAMIN D 25 Hydroxy (Vit-D Deficiency, Fractures)    6. Primary insomnia  F51.01 traZODone (DESYREL) 100 MG tablet    7. OSA on CPAP  G47.33 Ambulatory referral to Sleep Studies    8. Class 3 severe obesity due to excess calories with serious comorbidity and body mass index (BMI) of 50.0 to 59.9 in adult (HCC)  E66.01 Semaglutide,0.25 or 0.5MG /DOS, 2 MG/3ML SOPN   Z68.43        CHA2DS2-VASc Score is 2.  Yearly risk of stroke: 2.3% (CAD, HTN).  Meds ordered this encounter  Medications   amiodarone (PACERONE) 200 MG tablet    Sig: Take 0.5 tablets (100 mg total) by mouth daily.    Dispense:  45 tablet    Refill:  3   XARELTO 20 MG TABS tablet    Sig: Take 1 tablet (20 mg total) by mouth daily with supper.    Dispense:  90 tablet    Refill:  3   atorvastatin (LIPITOR)  80 MG tablet    Sig: Take 1 tablet (80 mg total) by mouth daily.    Dispense:  90 tablet    Refill:  3   benazepril (LOTENSIN) 10 MG tablet    Sig: Take 1 tablet (10 mg total) by mouth  daily.    Dispense:  90 tablet    Refill:  3   metoprolol tartrate (LOPRESSOR) 50 MG tablet    Sig: Take 1 tablet (50 mg total) by mouth 2 (two) times daily.    Dispense:  180 tablet    Refill:  3   traZODone (DESYREL) 100 MG tablet    Sig: Take 1 tablet (100 mg total) by mouth at bedtime.    Dispense:  30 tablet    Refill:  1   Semaglutide,0.25 or 0.5MG /DOS, 2 MG/3ML SOPN    Sig: Inject 0.25 mg into the skin once a week.    Dispense:  3 mL    Refill:  1    Medications Discontinued During This Encounter  Medication Reason   amiodarone (PACERONE) 200 MG tablet    XARELTO 20 MG TABS tablet    atorvastatin (LIPITOR) 80 MG tablet    benazepril (LOTENSIN) 10 MG tablet    metoprolol tartrate (LOPRESSOR) 100 MG tablet      Recommendations:   Joseph E Iley  is a 47 y.o. Caucasian  male patient with past medical history of paroxysmal atrial fibrillation and cardioversion on 01/17/2020 and 07/01/2020, hypertension, morbid obesity, obstructive sleep apnea on CPAP, tobacco use disorder still vaping, history of acute deep vein thrombosis and pulmonary embolism on 04/08/2015 following gastric sleeve resection and again presented with spontaneous right leg DVT on 03/07/2019 presently on long term Xarelto.  Due to multivessel coronary disease underwent CABG x 2 and LAA ligation on 06/27/2020.  1. Coronary artery disease involving native coronary artery of native heart with other form of angina pectoris [facility]) Patient has not had any recurrence of angina pectoris he states that overall he feels miserable in general and complaints of fatigue and inability to any work.  - EKG 12-Lead - metoprolol tartrate (LOPRESSOR) 50 MG tablet; Take 1 tablet (50 mg total) by mouth 2 (two) times daily.  Dispense: 180 tablet; Refill: 3 - Semaglutide,0.25 or 0.5MG /DOS, 2 MG/3ML SOPN; Inject 0.25 mg into the skin once a week.  Dispense: 3 mL; Refill: 1  2. Primary hypertension Blood pressure is well-controlled on  bisoprolol.  Prescription refilled.  He needs routine labs.  Ordered.  - benazepril (LOTENSIN) 10 MG tablet; Take 1 tablet (10 mg total) by mouth daily.  Dispense: 90 tablet; Refill: 3 - CMP14+EGFR - CBC  3. Hypercholesteremia Lipids previously well-controlled, he needs lipid profile testing.  Continue Lipitor 80 mg daily.  - atorvastatin (LIPITOR) 80 MG tablet; Take 1 tablet (80 mg total) by mouth daily.  Dispense: 90 tablet; Refill: 3 - Lipid Panel With LDL/HDL Ratio  4. Paroxysmal atrial fibrillation (HCC). CHA2DS2-VASc Score is 2.  Yearly risk of stroke: 2.3% (CAD, HTN).  Patient has not had any recurrence of atrial fibrillation since CABG and presently on amiodarone 100 mg daily, in view of morbid obesity, coronary artery disease, OSA it is felt that he probably needs to be on long-term amiodarone in view of severely symptomatic episodes of chest pain, dyspnea, heart failure and also fatigue.  He is presently on anticoagulation due to prior history of spontaneous DVT and also atrial fibrillation.  He has had left atrial appendage ligation hence from  cardiac standpoint he could potentially come off of anticoagulation as his CHA2DS2-VASc rescore is low.  Also has been maintaining sinus rhythm but he needs anticoagulation in view of primary hypercoagulable state.  - amiodarone (PACERONE) 200 MG tablet; Take 0.5 tablets (100 mg total) by mouth daily.  Dispense: 45 tablet; Refill: 3 - XARELTO 20 MG TABS tablet; Take 1 tablet (20 mg total) by mouth daily with supper.  Dispense: 90 tablet; Refill: 3 - metoprolol tartrate (LOPRESSOR) 50 MG tablet; Take 1 tablet (50 mg total) by mouth 2 (two) times daily.  Dispense: 180 tablet; Refill: 3  5. Hypovitaminosis D Will check vitamin D levels. - VITAMIN D 25 Hydroxy (Vit-D Deficiency, Fractures)  6. Primary insomnia Patient has had difficulty sleeping and has become nocturnal essentially, sleeping very late at night and waking up in the middle of the  day.  Will try trazodone short-term. - traZODone (DESYREL) 100 MG tablet; Take 1 tablet (100 mg total) by mouth at bedtime.  Dispense: 30 tablet; Refill: 1  7. OSA on CPAP Patient has severe sleep apnea, his last sleep study was many years ago.  I will refer him for sleep evaluation again, he also has a leaky mask, all this could be contributing to his generalized malaise and fatigue as well.  Otherwise stable from cardiac standpoint, I will see him back in 6 months for follow-up. - Ambulatory referral to Sleep Studies  8.  Class 3 severe obesity due to excess calories with serious comorbidity and body mass index (BMI) of 50.0 to 59.9 in adult [facility]) Both in view of underlying vascular disease, morbid obesity, I would like to try Ozempic.  I have sent in a prescription, advised him not to pick up the Rx if approved as I would like to give him samples at starting dose and I would like to place him on maximum tolerated dose of Ozempic to achieve weight loss.  If indeed his prescription is approved, I will see him back in 6 weeks for first dose administration and discussion education regarding semaglutide.  -Semaglutide,0.25 or 0.5MG /DOS, 2 MG/3ML SOPN  This was a 40-minute office visit encounter and discussions of multiple and various medical issues.    Yates Decamp, PA-C 06/10/2023, 9:09 PM Office: [phone]

## 2023-06-26 ENCOUNTER — Encounter: Payer: Self-pay | Admitting: Neurology

## 2023-06-26 ENCOUNTER — Ambulatory Visit: Payer: Medicaid Other | Admitting: Neurology

## 2023-06-26 VITALS — BP 112/58 | HR 48 | Ht 74.0 in | Wt >= 6400 oz

## 2023-06-26 DIAGNOSIS — G4719 Other hypersomnia: Secondary | ICD-10-CM

## 2023-06-26 DIAGNOSIS — Z6841 Body Mass Index (BMI) 40.0 and over, adult: Secondary | ICD-10-CM

## 2023-06-26 DIAGNOSIS — G4733 Obstructive sleep apnea (adult) (pediatric): Secondary | ICD-10-CM

## 2023-06-26 DIAGNOSIS — Z951 Presence of aortocoronary bypass graft: Secondary | ICD-10-CM

## 2023-06-26 NOTE — Patient Instructions (Addendum)
It was nice to meet you today.  As discussed, I will order a home sleep test for re-evaluation of your sleep apnea, as testing was over 15 years ago. The home sleep test is done (HST) to re-establish/confirm the sleep apnea diagnosis and to get your a new machine through the insurance. Our sleep lab staff will reach out to you to arrange for pickup and for tutorial of the test equipment. I will write for a new machine after the HST confirms the OSA (obstructive sleep apnea) diagnosis. Please remember, you will not use your AutoPap machine during the night of testing so we get diagnostic data, not treatment data.  Based on the test results I will likely prescribe a new CPAP machine for you.  If you have trouble sleeping without your CPAP at night, you can always consider taking a nap with your CPAP machine after your home sleep test is completed.    We will schedule a follow-up appointment after the new machine set-up date, typically within 31 to 89 days post treatment start.  Per insurance requirement, you will need to show compliance with usage and fulfill a minimum usage percentage.

## 2023-06-26 NOTE — Progress Notes (Signed)
Subjective:    Patient ID: Joseph Lang is a 47 y.o. male.  HPI    Huston Foley, MD, PhD [facility] [address] [address] [place], [place] [address]  Dear [name],   I saw your patient, Joseph Lang, upon your kind request and my sleep clinic today for initial consultation of his sleep disorder, in particular, evaluation of his prior diagnosis of obstructive sleep apnea.  The patient is unaccompanied today.  As you know, [name] is a 47 year old male with an underlying complex medical history of coronary artery disease with status post two-vessel CABG in 2022, lower extremity edema, paroxysmal A-fib, reflux disease, history of DVT, history of PE, history of gastric sleeve, tobacco use disorder, and severe obesity with a BMI of over 50, who was previously diagnosed with obstructive sleep apnea and placed on PAP therapy.  Prior sleep study results are not available for my review today.  His Epworth sleepiness score is 3 out of 24, fatigue severity score is 46 out of 63.  He has been using a AutoPap machine, I was able to review his PAP compliance data.  He has a Surveyor, quantity, which is an older model, we downloaded from the card reader, however, the name of the download is not his.  He reports that he uses this machine consistently.  His compliance data shows 100% compliance in the past 90 days with an average of 9 hours and 54 minutes, residual AHI at goal at 0.8/h, CPAP of 11 cm without EPR, leak acceptable with the 95th percentile at 7.2 L/min.  He reports that he uses a fullface mask.  He does not currently have a DME supplier.  He lives with his mom.  He does not currently work.  He drinks quite a bit of caffeine in the form of soda, about 5 cans/day.  Of note, he has trouble going to sleep.  Bedtime is around 4:30 AM and rise time between 9 AM and 1 PM.  He does not have nightly nocturia or recurrent nocturnal or morning headaches.  He does not sleep well  without his CPAP machine.  Prior sleep testing was over 15 years ago, this machine is over 37 years old.  He quit smoking cigarettes in 2022, he vapes nicotine daily.  He quit drinking alcohol last year.  He reports that he lost a significant amount of weight after his weight loss surgery but gained most of it back.  His Past Medical History Is Significant For: Past Medical History:  Diagnosis Date   Coronary artery disease 2021   Dysrhythmia 2021   Afib   GERD (gastroesophageal reflux disease)    History of blood clots    Hx of deep vein thrombophlebitis of lower extremity    Hx of gastric bypass    sleeve   Hx of pulmonary embolus    Hypertension    Obesity    Other hyperlipidemia    PONV (postoperative nausea and vomiting)    Throwing up after wt loss surgery   S/P CABG x 2 06/28/2020: LIMA to LAD, Free Radial graft to D1. LAA ligation with 40 mm Atricure. 06/27/2020   Sleep apnea    Swelling of both lower extremities    Tobacco abuse     His Past Surgical History Is Significant For: Past Surgical History:  Procedure Laterality Date   CARDIAC CATHETERIZATION Left 2021   CARDIOVERSION     CARDIOVERSION N/A 04/19/2020   Procedure: CARDIOVERSION;  Surgeon: Yates Decamp, MD;  Location[facility] ENDOSCOPY;  Service: Cardiovascular;  Laterality: N/A;   CARDIOVERSION N/A 04/26/2020   Procedure: CARDIOVERSION;  Surgeon: Yates Decamp, MD;  Location: [facility] ENDOSCOPY;  Service: Cardiovascular;  Laterality: N/A;   CARDIOVERSION N/A 07/01/2020   Procedure: CARDIOVERSION;  Surgeon: Yates Decamp, MD;  Location: [facility] ENDOSCOPY;  Service: Cardiovascular;  Laterality: N/A;   CLIPPING OF ATRIAL APPENDAGE N/A 06/27/2020   Procedure: CLIPPING OF ATRIAL APPENDAGE USING ATRICURE CLIP SIZE 40;  Surgeon: Delight Ovens, MD;  Location: [facility] OR;  Service: Open Heart Surgery;  Laterality: N/A;   CORONARY ARTERY BYPASS GRAFT N/A 06/27/2020   Procedure: CORONARY ARTERY BYPASS GRAFTING (CABG) X TWO, USING LEFT INTERNAL MAMMARY  ARTERY AND LEFT ARM RADIAL ARTERY;  Surgeon: Delight Ovens, MD;  Location: [facility] OR;  Service: Open Heart Surgery;  Laterality: N/A;   FRACTURE SURGERY  1995   pelvis   HIP SURGERY     LAPAROSCOPIC GASTRIC SLEEVE RESECTION N/A 02/14/2015   Procedure: LAPAROSCOPIC GASTRIC SLEEVE RESECTION WITH UPPER ENDOSCOPY;  Surgeon: Luretha Murphy, MD;  Location: [facility] ORS;  Service: General;  Laterality: N/A;   LEFT HEART CATH AND CORONARY ANGIOGRAPHY N/A 04/19/2020   Procedure: LEFT HEART CATH AND CORONARY ANGIOGRAPHY;  Surgeon: Yates Decamp, MD;  Location: [facility] INVASIVE CV LAB;  Service: Cardiovascular;  Laterality: N/A;   PELVIS CLOSED REDUCTION     RADIAL ARTERY HARVEST Left 06/27/2020   Procedure: LEFT RADIAL ARTERY HARVEST;  Surgeon: Delight Ovens, MD;  Location: [facility] OR;  Service: Open Heart Surgery;  Laterality: Left;   TEE WITHOUT CARDIOVERSION N/A 06/27/2020   Procedure: TRANSESOPHAGEAL ECHOCARDIOGRAM (TEE);  Surgeon: Delight Ovens, MD;  Location[facility] OR;  Service: Open Heart Surgery;  Laterality: N/A;   TEE WITHOUT CARDIOVERSION N/A 07/01/2020   Procedure: TRANSESOPHAGEAL ECHOCARDIOGRAM (TEE);  Surgeon: Yates Decamp, MD;  Location: [facility] ENDOSCOPY;  Service: Cardiovascular;  Laterality: N/A;    His Family History Is Significant For: Family History  Problem Relation Age of Onset   Atrial fibrillation Mother    CAD Mother    Heart disease Mother    Hypertension Mother    Hyperlipidemia Mother    Sleep apnea Mother    CAD Father    Atrial fibrillation Father    Diabetes Father    Hypertension Father    Hyperlipidemia Father    Sleep apnea Father     His Social History Is Significant For: Social History   Socioeconomic History   Marital status: Single    Spouse name: Not on file   Number of children: 0   Years of education: 12   Highest education level: High school graduate  Occupational History   Occupation: Medical laboratory scientific officer  Tobacco Use   Smoking status: Former     Current packs/day: 0.00    Average packs/day: 0.5 packs/day for 30.0 years (15.0 ttl pk-yrs)    Types: Cigarettes    Start date: 04/20/1990    Quit date: 04/20/2020    Years since quitting: 3.1   Smokeless tobacco: Never   Tobacco comments:    half a pack daily  Vaping Use   Vaping status: Every Day   Substances: Nicotine  Substance and Sexual Activity   Alcohol use: Not Currently   Drug use: No   Sexual activity: Not Currently  Other Topics Concern   Not on file  Social History Narrative   ** Merged History Encounter **       Social  Determinants of Health   Financial Resource Strain: Not on file  Food Insecurity: Not on file  Transportation Needs: Not on file  Physical Activity: Not on file  Stress: Not on file  Social Connections: Unknown (02/23/2022)   [facility]   Social Network    Social Network: Not on file    His Allergies Are:  Allergies  Allergen Reactions   Amoxicillin Nausea And Vomiting  :   His Current Medications Are:  Outpatient Encounter Medications as of 06/26/2023  Medication Sig   amiodarone (PACERONE) 200 MG tablet Take 0.5 tablets (100 mg total) by mouth daily.   aspirin EC 81 MG tablet Take 1 tablet (81 mg total) by mouth daily.   atorvastatin (LIPITOR) 80 MG tablet Take 1 tablet (80 mg total) by mouth daily.   benazepril (LOTENSIN) 10 MG tablet Take 1 tablet (10 mg total) by mouth daily.   Cholecalciferol (VITAMIN D3) 75 MCG (3000 UT) TABS Take 1 tablet by mouth daily.   isosorbide mononitrate (IMDUR) 30 MG 24 hr tablet Take 0.5 tablets by mouth daily.   metoprolol tartrate (LOPRESSOR) 50 MG tablet Take 1 tablet (50 mg total) by mouth 2 (two) times daily.   RABEprazole (ACIPHEX) 20 MG tablet Take 20 mg by mouth daily.    Semaglutide,0.25 or 0.5MG /DOS, 2 MG/3ML SOPN Inject 0.25 mg into the skin once a week.   traZODone (DESYREL) 100 MG tablet Take 1 tablet (100 mg total) by mouth at bedtime.   XARELTO 20 MG TABS  tablet Take 1 tablet (20 mg total) by mouth daily with supper.   No facility-administered encounter medications on file as of 06/26/2023.  :   Review of Systems:  Out of a complete 14 point review of systems, all are reviewed and negative with the exception of these symptoms as listed below:   Review of Systems  Neurological:        Pt here for sleep consult Pt states uses pap machine 4+ hours nighty  Pt states hard for him to fall asleep  Pt states had sleep study 15+ years ago and CPAP at least 10 years ago Pt states hypertension . Pt states open heart surgery 2022     Objective:  Neurological Exam  Physical Exam Physical Examination:   Vitals:   06/26/23 1534  BP: (!) 112/58  Pulse: (!) 48    General Examination: The patient is a very pleasant 47 y.o. male in no acute distress. He appears well-developed and well-nourished and well groomed.   HEENT: Normocephalic, atraumatic, pupils are equal, round and reactive to light, extraocular tracking is good without limitation to gaze excursion or nystagmus noted. Hearing is grossly intact. Face is symmetric with normal facial animation. Speech is clear with no dysarthria noted. There is no hypophonia. There is no lip, neck/head, jaw or voice tremor. Neck is supple with full range of passive and active motion. There are no carotid bruits on auscultation. Oropharynx exam reveals: mild mouth dryness, adequate dental hygiene and moderate airway crowding, due to larger uvula, tonsillar size of about 2+ bilaterally, Mallampati class II.  Neck circumference 20 three-quarter inches.  Tongue protrudes centrally and palate elevates symmetrically.  Chest: Clear to auscultation without wheezing, rhonchi or crackles noted.  Heart: S1+S2+0, regular and normal without murmurs, rubs or gallops noted.   Abdomen: Soft, non-tender and non-distended.  Extremities: There is 1+ pitting edema in the distal lower extremities bilaterally.   Skin: Warm and  dry without  trophic changes noted.   Musculoskeletal: exam reveals no obvious joint deformities.   Neurologically:  Mental status: The patient is awake, alert and oriented in all 4 spheres. His immediate and remote memory, attention, language skills and fund of knowledge are appropriate. There is no evidence of aphasia, agnosia, apraxia or anomia. Speech is clear with normal prosody and enunciation. Thought process is linear. Mood is normal and affect is normal.  Cranial nerves II - XII are as described above under HEENT exam.  Motor exam: Normal bulk, strength and tone is noted. There is no obvious action or resting tremor.  Fine motor skills and coordination: grossly intact.  Cerebellar testing: No dysmetria or intention tremor. There is no truncal or gait ataxia.  Sensory exam: intact to light touch in the upper and lower extremities.  Gait, station and balance: He stands without difficulty but slightly slowly, he walks slowly.   Assessment and Plan:  In summary, Joseph E Woolverton is a very pleasant 47 y.o.-year old male with an underlying complex medical history of coronary artery disease with status post two-vessel CABG in 2022, lower extremity edema, paroxysmal A-fib, reflux disease, history of DVT, history of PE, history of gastric sleeve, tobacco use disorder, and severe obesity with a BMI of over 50, who presents for evaluation of his obstructive sleep apnea.  He reports that his sleep apnea was severe, testing was several years ago and he has an older CPAP machine with a set pressure of 11 cm.  He reports full compliance, compliance data from the machine indeed shows 100% compliance and good apnea control.  I suggested we proceed with a home sleep test for reevaluation and qualifying him for a new machine.  He is reluctant to come in for a laboratory sleep study as he does not want to leave his mom alone overnight.  He is willing to proceed with a home sleep test but is worried that he may not be  able to sleep very much without it.  He is encouraged to try the best she can for the home sleep test so we can reestablish his sleep apnea diagnosis and issue a prescription for new machine.  We will also help him establish with a local supplier for his new machine and supplies.  We talked about the importance of maintaining a healthy lifestyle, he is encouraged to quit vaping nicotine.  We talked about the importance of weight loss.  He is advised to keep close follow-up with his primary care.  He indicates mood irritability and reports that he has tried several different medications in the past, he is encouraged to talk to his PCP about the possibility of seeing a counselor.  We will plan a follow-up after sleep testing and after he has started using his new CPAP machine so we can monitor apnea control and compliance.  He is asking if he would go into A-fib if he does not use his CPAP 1 night.  I explained to him that it is impossible to predict why and when he could go back into A-fib.  Triggers can be sleep deprivation, stress, dehydration, and caffeine.  He is encouraged to scale back on his caffeine intake.  He is reassured that he is on a blood thinner and also an antiarrhythmic medication.  I answered all his questions today and he was in agreement with our plan.   Thank you very much for allowing me to participate in the care of this nice patient. If I can  be of any further assistance to you please do not hesitate to call me at [phone].  Sincerely,   Huston Foley, MD, PhD

## 2023-06-27 ENCOUNTER — Telehealth: Payer: Self-pay | Admitting: Neurology

## 2023-06-27 NOTE — Telephone Encounter (Signed)
[facility] blue pending uploaded notes  

## 2023-07-01 NOTE — Telephone Encounter (Signed)
[facility]ST[facility] no auth req via fax form

## 2023-07-05 ENCOUNTER — Other Ambulatory Visit: Payer: Self-pay

## 2023-07-05 MED ORDER — ISOSORBIDE MONONITRATE ER 30 MG PO TB24: 15.0000 mg | ORAL_TABLET | Freq: Every day | ORAL | 3 refills | Status: DC

## 2023-07-16 ENCOUNTER — Ambulatory Visit: Payer: Medicaid Other | Admitting: Neurology

## 2023-07-16 DIAGNOSIS — G4733 Obstructive sleep apnea (adult) (pediatric): Secondary | ICD-10-CM

## 2023-07-16 DIAGNOSIS — G4719 Other hypersomnia: Secondary | ICD-10-CM

## 2023-07-16 DIAGNOSIS — Z951 Presence of aortocoronary bypass graft: Secondary | ICD-10-CM

## 2023-07-18 ENCOUNTER — Telehealth: Payer: Self-pay | Admitting: *Deleted

## 2023-07-18 NOTE — Telephone Encounter (Signed)
I called pt at Dr. Teofilo Pod request.  He stated he did HST without cpap for about 4-5 hours but could not finish it due to choking and resumed the rest of it with cpap on.  (Maybe 2-3 hrs).   I relayed needed to know this information so as to put in sleep study report.  He relayed this to technician when he brought the machine back in.

## 2023-07-18 NOTE — Procedures (Signed)
[facility]  HOME SLEEP TEST (Watch PAT) REPORT  STUDY DATE: 07/17/2023  DOB: 07-07-1976  MRN: [id number]  ORDERING CLINICIAN: Huston Foley, MD, PhD   REFERRING CLINICIAN: Georgann Housekeeper, MD   CLINICAL INFORMATION/HISTORY: 47 year old male with an underlying complex medical history of coronary artery disease with status post two-vessel CABG in 2022, lower extremity edema, paroxysmal A-fib, reflux disease, history of DVT, history of PE, history of gastric sleeve, tobacco use disorder, and severe obesity with a BMI of over 50, who presents for evaluation of his obstructive sleep apnea.  He was diagnosed with obstructive sleep apnea many years ago and has an order CPAP machine, current pressure of 11 cm without EPR with adequate apnea control and good tolerance of treatment.  He should be eligible for new equipment.  Please note, the patient reported after the test that he slept about 4 to 5 hours without the CPAP and then he put the CPAP machine on and slept the rest of the study with his CPAP on, it is visible in his study that his snoring and respiratory events improved towards the end of the night, the patient had been instructed to do his home sleep test without PAP therapy.    Epworth sleepiness score: 3/24.  BMI: 54.6 kg/m  FINDINGS:   Sleep Summary:   Total Recording Time (hours, min): 9 hours, 8 min  Total Sleep Time (hours, min):  8 hours, 0 min  Percent REM (%):    15.6%   Respiratory Indices:   Calculated pAHI (per hour):  19.6/hour         REM pAHI:    32.9/hour       NREM pAHI: 17.1/hour  Central pAHI: 1.9/hour  Oxygen Saturation Statistics:    Oxygen Saturation (%) Mean: 95%   Minimum oxygen saturation (%):                 85%   O2 Saturation Range (%): 85-99%    O2 Saturation (minutes) <=88%: 0 min  Pulse Rate Statistics:   Pulse Mean (bpm):    48/min    Pulse Range (36-85/min)   IMPRESSION: OSA (obstructive sleep apnea)    RECOMMENDATION:  This home sleep test demonstrates moderate obstructive sleep apnea with a total AHI of 19.6/hour and O2 nadir of 85%.  Snoring was detected ranging from mild to louder.  The last portion of the study, approximately 2 hours show improvement of his snoring, oxygen saturations and respiratory events.  The patient indicated that he slept some portion of the study at the end with his PAP machine.  This likely has led to an underestimation of his AHI and O2 nadir.   The patient has been compliant with CPAP therapy at home and should be eligible for a new machine which I will prescribe. I recommend keeping his pressure setting of 11 cm on his new machine.  A full night titration study may be considered to optimize treatment settings, monitor proper oxygen saturations and aid with improvement of tolerance and adherence, if needed down the road.  Alternative treatment options may include a dental device through dentistry or orthodontics in selected patients or Inspire (hypoglossal nerve stimulator) in carefully selected patients (meeting inclusion criteria).  Concomitant weight loss is recommended (where clinically appropriate). Please note that untreated obstructive sleep apnea may carry additional perioperative morbidity. Patients with significant obstructive sleep apnea should receive perioperative PAP therapy and the surgeons and particularly the anesthesiologist should be informed of the diagnosis and  the severity of the sleep disordered breathing. The patient should be cautioned not to drive, work at heights, or operate dangerous or heavy equipment when tired or sleepy. Review and reiteration of good sleep hygiene measures should be pursued with any patient. Other causes of the patient's symptoms, including circadian rhythm disturbances, an underlying mood disorder, medication effect and/or an underlying medical problem cannot be ruled out based on this test. Clinical correlation is  recommended.  The patient and his referring provider will be notified of the test results. The patient will be seen in follow up in sleep clinic at [facility].  I certify that I have reviewed the raw data recording prior to the issuance of this report in accordance with the standards of the American Academy of Sleep Medicine (AASM).  INTERPRETING PHYSICIAN:   Huston Foley, MD, PhD Medical Director, [facility]) Diplomat, ABPN (Neurology and Sleep)   [facility] [address] [place], [place] [address] [phone]

## 2023-07-18 NOTE — Addendum Note (Signed)
Addended by: Huston Foley on: 07/18/2023 06:01 PM   Modules accepted: Orders

## 2023-07-24 NOTE — Telephone Encounter (Addendum)
Pt called office, he agreed with getting set up with [facility]. FU appt made with Amy L 12/19 at 330.     Bobbye Morton, CMA  Zott, Kennyth Arnold; Gamble, Tammy New orders have been placed for the above pt, DOB: 09-May-1976 Thanks Zott, Stacy  Yasheka Fossett, Abbe Amsterdam, CMA; Melvern Sample Got it Thank You

## 2023-09-22 ENCOUNTER — Other Ambulatory Visit: Payer: Self-pay

## 2023-09-22 ENCOUNTER — Emergency Department (HOSPITAL_COMMUNITY): Payer: Medicaid Other

## 2023-09-22 ENCOUNTER — Encounter (HOSPITAL_COMMUNITY): Payer: Self-pay

## 2023-09-22 ENCOUNTER — Emergency Department (HOSPITAL_COMMUNITY)
Admission: EM | Admit: 2023-09-22 | Discharge: 2023-09-22 | Disposition: A | Discharge status: Home / Self Care | Payer: Medicaid Other | Attending: Emergency Medicine | Admitting: Emergency Medicine

## 2023-09-22 DIAGNOSIS — Z7901 Long term (current) use of anticoagulants: Secondary | ICD-10-CM | POA: Diagnosis not present

## 2023-09-22 DIAGNOSIS — I10 Essential (primary) hypertension: Secondary | ICD-10-CM | POA: Diagnosis not present

## 2023-09-22 DIAGNOSIS — N200 Calculus of kidney: Secondary | ICD-10-CM | POA: Insufficient documentation

## 2023-09-22 DIAGNOSIS — Z79899 Other long term (current) drug therapy: Secondary | ICD-10-CM | POA: Insufficient documentation

## 2023-09-22 DIAGNOSIS — R310 Gross hematuria: Secondary | ICD-10-CM

## 2023-09-22 DIAGNOSIS — Z7982 Long term (current) use of aspirin: Secondary | ICD-10-CM | POA: Diagnosis not present

## 2023-09-22 LAB — COMPREHENSIVE METABOLIC PANEL
ALT: 34 U/L (ref 0–44)
AST: 24 U/L (ref 15–41)
Albumin: 3.7 g/dL (ref 3.5–5.0)
Alkaline Phosphatase: 76 U/L (ref 38–126)
Anion gap: 11 (ref 5–15)
BUN: 10 mg/dL (ref 6–20)
CO2: 21 mmol/L — ABNORMAL LOW (ref 22–32)
Calcium: 9.2 mg/dL (ref 8.9–10.3)
Chloride: 107 mmol/L (ref 98–111)
Creatinine, Ser: 1 mg/dL (ref 0.61–1.24)
GFR, Estimated: >60 mL/min (ref >60)
Glucose, Bld: 114 mg/dL — ABNORMAL HIGH (ref 70–99)
Potassium: 4 mmol/L (ref 3.5–5.1)
Sodium: 139 mmol/L (ref 135–145)
Total Bilirubin: 1 mg/dL (ref <1.2)
Total Protein: 6.7 g/dL (ref 6.5–8.1)

## 2023-09-22 LAB — CBC WITH DIFFERENTIAL/PLATELET
Abs Immature Granulocytes: 0.04 10*3/uL (ref 0.00–0.07)
Basophils Absolute: 0 10*3/uL (ref 0.0–0.1)
Basophils Relative: 0 %
Eosinophils Absolute: 0.3 10*3/uL (ref 0.0–0.5)
Eosinophils Relative: 3 %
HCT: 44.7 % (ref 39.0–52.0)
Hemoglobin: 14.6 g/dL (ref 13.0–17.0)
Immature Granulocytes: 0 %
Lymphocytes Relative: 25 %
Lymphs Abs: 3.2 10*3/uL (ref 0.7–4.0)
MCH: 28 pg (ref 26.0–34.0)
MCHC: 32.7 g/dL (ref 30.0–36.0)
MCV: 85.8 fL (ref 80.0–100.0)
Monocytes Absolute: 0.7 10*3/uL (ref 0.1–1.0)
Monocytes Relative: 5 %
Neutro Abs: 8.4 10*3/uL — ABNORMAL HIGH (ref 1.7–7.7)
Neutrophils Relative %: 67 %
Platelets: 309 10*3/uL (ref 150–400)
RBC: 5.21 MIL/uL (ref 4.22–5.81)
RDW: 14 % (ref 11.5–15.5)
WBC: 12.6 10*3/uL — ABNORMAL HIGH (ref 4.0–10.5)
nRBC: 0 % (ref 0.0–0.2)

## 2023-09-22 LAB — URINALYSIS, ROUTINE W REFLEX MICROSCOPIC

## 2023-09-22 LAB — URINALYSIS, MICROSCOPIC (REFLEX): RBC / HPF: >50 RBC/hpf (ref 0–5)

## 2023-09-22 MED ORDER — SULFAMETHOXAZOLE-TRIMETHOPRIM 800-160 MG PO TABS: 1.0000 | ORAL_TABLET | Freq: Two times a day (BID) | ORAL | 0 refills | Status: AC

## 2023-09-22 MED ORDER — FENTANYL CITRATE PF 50 MCG/ML IJ SOSY
100.0000 ug | PREFILLED_SYRINGE | Freq: Once | INTRAMUSCULAR | Status: AC
  Administered 2023-09-22: 100 ug via INTRAVENOUS
  Filled 2023-09-22: qty 2

## 2023-09-22 MED ORDER — ONDANSETRON HCL 4 MG/2ML IJ SOLN
4.0000 mg | Freq: Once | INTRAMUSCULAR | Status: AC
  Administered 2023-09-22: 4 mg via INTRAVENOUS
  Filled 2023-09-22: qty 2

## 2023-09-22 MED ORDER — ONDANSETRON 4 MG PO TBDP: 4.0000 mg | ORAL_TABLET | Freq: Three times a day (TID) | ORAL | 0 refills | Status: DC | PRN

## 2023-09-22 MED ORDER — HYDROMORPHONE HCL 1 MG/ML IJ SOLN
1.0000 mg | Freq: Once | INTRAMUSCULAR | Status: AC
  Administered 2023-09-22: 1 mg via INTRAVENOUS
  Filled 2023-09-22: qty 1

## 2023-09-22 MED ORDER — TAMSULOSIN HCL 0.4 MG PO CAPS: 0.4000 mg | ORAL_CAPSULE | Freq: Every day | ORAL | 0 refills | Status: DC

## 2023-09-22 MED ORDER — SULFAMETHOXAZOLE-TRIMETHOPRIM 800-160 MG PO TABS
1.0000 | ORAL_TABLET | Freq: Once | ORAL | Status: AC
  Administered 2023-09-22: 1 via ORAL
  Filled 2023-09-22: qty 1

## 2023-09-22 MED ORDER — OXYCODONE-ACETAMINOPHEN 5-325 MG PO TABS: 1.0000 | ORAL_TABLET | ORAL | 0 refills | Status: AC | PRN

## 2023-09-22 NOTE — ED Provider Notes (Signed)
Joseph Lang   Joseph Lang: [id number] Arrival date & time: 09/22/23  4098     History  Chief Complaint  Patient presents with   Hematuria    Joseph Lang is a 47 y.o. male with history of hypertension, nephrolithiasis, DVT on xarelto, presents with concern for left sided testicular pain and abdominal pain that started this morning.  The pain comes in waves and is associated with nausea.  He also reports his urine was a  chocolate color this morning with a foul smell. Denies any fever or chills.   Hematuria       Home Medications Prior to Admission medications   Medication Sig Start Date End Date Taking? Authorizing Provider  ondansetron (ZOFRAN-ODT) 4 MG disintegrating tablet Take 1 tablet (4 mg total) by mouth every 8 (eight) hours as needed for nausea or vomiting. 09/22/23  Yes Arabella Merles, PA-C  oxyCODONE-acetaminophen (PERCOCET/ROXICET) 5-325 MG tablet Take 1 tablet by mouth every 4 (four) hours as needed for up to 5 days for severe pain (pain score 7-10) (Pain not controlled with ibuprofen). 09/22/23 09/27/23 Yes Arabella Merles, PA-C  sulfamethoxazole-trimethoprim (BACTRIM DS) 800-160 MG tablet Take 1 tablet by mouth 2 (two) times daily for 10 days. 09/22/23 10/02/23 Yes Arabella Merles, PA-C  tamsulosin (FLOMAX) 0.4 MG CAPS capsule Take 1 capsule (0.4 mg total) by mouth daily. 09/22/23  Yes Arabella Merles, PA-C  amiodarone (PACERONE) 200 MG tablet Take 0.5 tablets (100 mg total) by mouth daily. 06/10/23   Yates Decamp, MD  aspirin EC 81 MG tablet Take 1 tablet (81 mg total) by mouth daily. 04/23/20   Yates Decamp, MD  atorvastatin (LIPITOR) 80 MG tablet Take 1 tablet (80 mg total) by mouth daily. 06/10/23   Yates Decamp, MD  benazepril (LOTENSIN) 10 MG tablet Take 1 tablet (10 mg total) by mouth daily. 06/10/23   Yates Decamp, MD  Cholecalciferol (VITAMIN D3) 75 MCG (3000 UT) TABS Take 1 tablet by mouth daily.    [provider]  isosorbide mononitrate (IMDUR) 30 MG 24 hr tablet Take 0.5 tablets (15 mg total) by mouth daily. 07/05/23   Yates Decamp, MD  metoprolol tartrate (LOPRESSOR) 50 MG tablet Take 1 tablet (50 mg total) by mouth 2 (two) times daily. 06/10/23   Yates Decamp, MD  RABEprazole (ACIPHEX) 20 MG tablet Take 20 mg by mouth daily.  01/04/20   [provider]  Semaglutide,0.25 or 0.5MG /DOS, 2 MG/3ML SOPN Inject 0.25 mg into the skin once a week. 06/10/23   Yates Decamp, MD  traZODone (DESYREL) 100 MG tablet Take 1 tablet (100 mg total) by mouth at bedtime. 06/10/23   Yates Decamp, MD  XARELTO 20 MG TABS tablet Take 1 tablet (20 mg total) by mouth daily with supper. 06/10/23   Yates Decamp, MD      Allergies    Amoxicillin    Review of Systems   Review of Systems  Genitourinary:  Positive for hematuria.    Physical Exam Updated Vital Signs BP (!) 150/58   Pulse 71   Temp 98.1 F (36.7 C) (Oral)   Resp 20   Ht 6\' 2"  (1.88 m)   Wt (!) 193.1 kg   SpO2 97%   BMI 54.66 kg/m  Physical Exam Vitals and nursing Lang reviewed. Exam conducted with a chaperone present.  Constitutional:      General: He is not in acute distress.    Appearance: He is well-developed.  HENT:  Head: Normocephalic and atraumatic.  Eyes:     Conjunctiva/sclera: Conjunctivae normal.  Cardiovascular:     Rate and Rhythm: Normal rate and regular rhythm.     Heart sounds: No murmur heard. Pulmonary:     Effort: Pulmonary effort is normal. No respiratory distress.     Breath sounds: Normal breath sounds.  Abdominal:     Palpations: Abdomen is soft.     Tenderness: There is no abdominal tenderness. There is left CVA tenderness. There is no right CVA tenderness.  Genitourinary:    Comments: RN lillian chaperoned exam.  Scrotum with no erythema, no edema, no hydroceles appreciated   Left testicle higher than right.  No tenderness palpation of the testicle Musculoskeletal:        General: No swelling.      Cervical back: Neck supple.  Skin:    General: Skin is warm and dry.     Capillary Refill: Capillary refill takes less than 2 seconds.  Neurological:     Mental Status: He is alert.  Psychiatric:        Mood and Affect: Mood normal.     ED Results / Procedures / Treatments   Labs (all labs ordered are listed, but only abnormal results are displayed) Labs Reviewed  URINALYSIS, ROUTINE W REFLEX MICROSCOPIC - Abnormal; Notable for the following components:      Result Value   Color, Urine RED (*)    APPearance TURBID (*)    Glucose, UA   (*)    Value: TEST NOT REPORTED DUE TO COLOR INTERFERENCE OF URINE PIGMENT   Hgb urine dipstick   (*)    Value: TEST NOT REPORTED DUE TO COLOR INTERFERENCE OF URINE PIGMENT   Bilirubin Urine   (*)    Value: TEST NOT REPORTED DUE TO COLOR INTERFERENCE OF URINE PIGMENT   Ketones, ur   (*)    Value: TEST NOT REPORTED DUE TO COLOR INTERFERENCE OF URINE PIGMENT   Protein, ur   (*)    Value: TEST NOT REPORTED DUE TO COLOR INTERFERENCE OF URINE PIGMENT   Nitrite   (*)    Value: TEST NOT REPORTED DUE TO COLOR INTERFERENCE OF URINE PIGMENT   Leukocytes,Ua   (*)    Value: TEST NOT REPORTED DUE TO COLOR INTERFERENCE OF URINE PIGMENT   All other components within normal limits  CBC WITH DIFFERENTIAL/PLATELET - Abnormal; Notable for the following components:   WBC 12.6 (*)    Neutro Abs 8.4 (*)    All other components within normal limits  COMPREHENSIVE METABOLIC PANEL - Abnormal; Notable for the following components:   CO2 21 (*)    Glucose, Bld 114 (*)    All other components within normal limits  URINALYSIS, MICROSCOPIC (REFLEX) - Abnormal; Notable for the following components:   Bacteria, UA RARE (*)    All other components within normal limits    EKG None  Radiology US SCROTUM W/DOPPLER  Result Date: 09/22/2023 CLINICAL DATA:  47 year old male with testicular pain. EXAM: SCROTAL ULTRASOUND DOPPLER ULTRASOUND OF THE TESTICLES TECHNIQUE:  Complete ultrasound examination of the testicles, epididymis, and other scrotal structures was performed. Color and spectral Doppler ultrasound were also utilized to evaluate blood flow to the testicles. COMPARISON:  CT Abdomen and Pelvis 0733 hours today. Also prior scrotal ultrasound 12/15/2016. FINDINGS: Right testicle Measurements: 2.3 x 1.6 x 1.6 cm. No mass or microlithiasis visualized. Left testicle Measurements: 2.6 x 1.3 x 2.0 cm. No suspicious left testicular mass. There are  2 small 1-2 mm echogenic foci within the parenchyma (such as image 27) which appear benign. There is also a small 2-3 mm testicular or scrotal tunica cyst (image 44). These findings are unchanged since 2018 and appear inconsequential. Right epididymis:  Within normal limits. Left epididymis:  Within normal limits. Hydrocele:  None visualized. Varicocele:  None visualized. Pulsed Doppler interrogation of both testes demonstrates normal low resistance arterial and venous waveforms bilaterally. IMPRESSION: Negative for testicular mass or torsion. Small and incidental left testicular findings which are unchanged from 2018. Electronically Signed   By: Odessa Fleming M.D.   On: 09/22/2023 08:42   CT Renal Stone Study  Result Date: 09/22/2023 CLINICAL DATA:  Flank pain, hematuria. EXAM: CT ABDOMEN AND PELVIS WITHOUT CONTRAST TECHNIQUE: Multidetector CT imaging of the abdomen and pelvis was performed following the standard protocol without IV contrast. RADIATION DOSE REDUCTION: This exam was performed according to the departmental dose-optimization program which includes automated exposure control, adjustment of the mA and/or kV according to patient size and/or use of iterative reconstruction technique. COMPARISON:  None Available. FINDINGS: Lower chest: No acute abnormality. No pleural or pericardial effusion. Hepatobiliary: No focal liver abnormality is seen. No gallstones, gallbladder wall thickening, or biliary dilatation. Pancreas:  Unremarkable. No pancreatic ductal dilatation or surrounding inflammatory changes. Spleen: Normal in size without focal abnormality. Adrenals/Urinary Tract: No adrenal lesions. Left-sided hydronephrosis with a proximal ureteral stone measuring 6 mm. A few punctate mm sized intrarenal stones identified on the left. Urinary bladder is empty. Stomach/Bowel: Postop changes in the stomach. Appendix appears normal. No evidence of bowel wall thickening, distention, or inflammatory changes. Vascular/Lymphatic: Aortic atherosclerosis. No enlarged abdominal or pelvic lymph nodes. Reproductive: Prostate is unremarkable. Other: No abdominal wall hernia or abnormality. No abdominopelvic ascites. Musculoskeletal: No acute or significant osseous findings. Postop changes right acetabulum. IMPRESSION: 1. Left-sided nephrolithiasis with a proximal ureteral stone causing obstruction. 2. Aortic Atherosclerosis (ICD10-I70.0). Electronically Signed   By: Layla Maw M.D.   On: 09/22/2023 08:15    Procedures Procedures    Medications Ordered in ED Medications  fentaNYL (SUBLIMAZE) injection 100 mcg (100 mcg Intravenous Given 09/22/23 0659)  ondansetron (ZOFRAN) injection 4 mg (4 mg Intravenous Given 09/22/23 0659)  HYDROmorphone (DILAUDID) injection 1 mg (1 mg Intravenous Given 09/22/23 0826)  sulfamethoxazole-trimethoprim (BACTRIM DS) 800-160 MG per tablet 1 tablet (1 tablet Oral Given 09/22/23 0909)    ED Course/ Medical Decision Making/ A&P                                 Medical Decision Making Amount and/or Complexity of Data Reviewed Labs: ordered. Radiology: ordered.  Risk Prescription drug management.     Differential diagnosis includes but is not limited to Cholelithiasis, cholangitis, choledocholithiasis, peptic ulcer, gastritis, gastroenteritis, appendicitis, IBS, IBD, DKA, nephrolithiasis, UTI, pyelonephritis, pancreatitis, diverticulitis, mesenteric ischemia, abdominal aortic aneurysm, small  bowel obstruction, volvulus, testicular torsion, ovarian torsion, and females of childbearing age pregnancy   ED Course:  Patient well-appearing, no acute distress.  Vital signs stable.  He reports intermittent left-sided flank and abdominal pain as well as pain that goes into the testicle that started this morning approximately 5 AM and hematuria. Upon testicular exam, no erythema, edema, or high riding testicle.  Testicular ultrasound with no signs of testicular torsion, no acute abnormalities.  CT renal study was obtained which showed 6 mm stone in the left ureter.   Patient was given dilaudid, fentanyl for  pain and Zofran for nausea.  Upon re-evaluation reports this has completely resolved his pain.  Able to tolerate p.o.  He does have a slight leukocytosis of 12.6, and given he has reported some foul-smelling urine and white blood cells in his urine found on urinalysis, will treat for possible UTI/pyelonephritis with course of Bactrim at home.  He was given his first dose here today.  CMP unremarkable. Patient appropriate for discharge home with pain control with ibuprofen, Percocet, Zofran.  I discussed that he needs to follow-up with urology within the next week for follow-up.  He was instructed to strain his urine and bring stone into his urology visit.  He verbalizes understanding.  Patient discharged home with strict return precautions given   Impression: Left-sided nephrolithiasis Gross hematuria              Final Clinical Impression(s) / ED Diagnoses Final diagnoses:  Left nephrolithiasis  Gross hematuria    Rx / DC Orders ED Discharge Orders          Ordered    sulfamethoxazole-trimethoprim (BACTRIM DS) 800-160 MG tablet  2 times daily        09/22/23 0918    oxyCODONE-acetaminophen (PERCOCET/ROXICET) 5-325 MG tablet  Every 4 hours PRN        09/22/23 0918    tamsulosin (FLOMAX) 0.4 MG CAPS capsule  Daily        09/22/23 0918    ondansetron (ZOFRAN-ODT) 4 MG  disintegrating tablet  Every 8 hours PRN        09/22/23 0918              Arabella Merles, PA-C 09/22/23 0935    Alvira Monday, MD 09/23/23 1130

## 2023-09-22 NOTE — Discharge Instructions (Addendum)
You were seen in the emergency department and found to have a 6mm kidney stone in your left ureter.  Please strain your urine and keep the stone when it passes.  Please follow-up with urology within the next week. Their contact information is listed below. Bring the stone with you to your appointment.    We are sending you home with multiple medications to assist with passing the stone:   -Flomax-this is a medication to help pass the stone, it allows urine to exit the body more freely.  Please take this once daily with a meal until you pass your stone.  -Ibuprofen 800 mg-this is a medication that will help with pain as well as passing the stone.  Please take this every 8 hours.  Take this with food as it can cause stomach upset and at worst stomach bleeding.  Do not take other NSAIDs such as Motrin, Aleve, Advil, Mobic, or Naproxen with this medicine as they are similar and would propagate any potential side effects.   -Percocet-this is a narcotic/controlled substance medication that has potential addicting qualities.  We recommend that you take 1-2 tablets every 6 hours as needed for severe pain.  Do not drive or operate heavy machinery when taking this medicine as it can be sedating. Do not drink alcohol or take other sedating medications when taking this medicine for safety reasons.  Keep this out of reach of small children.  Please be aware this medicine has Tylenol in it (325 mg/tab) do not exceed the maximum dose of Tylenol in a day per over the counter recommendations should you decide to supplement with Tylenol over the counter.   -Zofran-this is an antinausea medication, you may take this every 8 hours as needed for nausea and vomiting, please allow the tablet to dissolve underneath of your tongue.   You have been prescribed an antibiotic called Bactrim. Take this antibiotic 2 times a day for the next 10 days. Take the full course of your antibiotic even if you start feeling better. Antibiotics  may cause you to have diarrhea.   We have prescribed you new medication(s) today. Discuss the medications prescribed today with your pharmacist as they can have adverse effects and interactions with your other medicines including over the counter and prescribed medications. Seek medical evaluation if you start to experience new or abnormal symptoms after taking one of these medicines, seek care immediately if you start to experience difficulty breathing, feeling of your throat closing, facial swelling, or rash as these could be indications of a more serious allergic reaction  Please follow-up with the urology group provided in your discharge instructions within the next week.  Return to the ER for new or worsening symptoms including but not limited to worsening pain not controlled by these medicines, inability to keep fluids down, fever, new rashes, or any other concerns that you may have.

## 2023-09-22 NOTE — ED Triage Notes (Signed)
Pt BIB [facility] d/t hematuria onset 0500 - with pain in left testicle.  Pt has hx of Kidney stones but this has more blood and no back pain.

## 2023-09-26 ENCOUNTER — Ambulatory Visit: Payer: Medicaid Other | Admitting: Adult Health

## 2023-09-26 ENCOUNTER — Encounter: Payer: Self-pay | Admitting: Adult Health

## 2023-09-26 ENCOUNTER — Telehealth: Payer: Self-pay | Admitting: Neurology

## 2023-09-26 VITALS — BP 115/68 | HR 62 | Ht 74.0 in | Wt >= 6400 oz

## 2023-09-26 DIAGNOSIS — G4733 Obstructive sleep apnea (adult) (pediatric): Secondary | ICD-10-CM | POA: Diagnosis not present

## 2023-09-26 NOTE — Telephone Encounter (Signed)
Pt called to confirm appointment details

## 2023-10-03 ENCOUNTER — Encounter: Payer: Medicaid Other | Admitting: Family Medicine

## 2023-10-14 ENCOUNTER — Emergency Department (HOSPITAL_BASED_OUTPATIENT_CLINIC_OR_DEPARTMENT_OTHER)
Admission: EM | Admit: 2023-10-14 | Discharge: 2023-10-15 | Disposition: A | Discharge status: Home / Self Care | Payer: Medicaid Other | Attending: Emergency Medicine | Admitting: Emergency Medicine

## 2023-10-14 ENCOUNTER — Other Ambulatory Visit: Payer: Self-pay

## 2023-10-14 ENCOUNTER — Encounter (HOSPITAL_BASED_OUTPATIENT_CLINIC_OR_DEPARTMENT_OTHER): Payer: Self-pay | Admitting: Emergency Medicine

## 2023-10-14 DIAGNOSIS — T7840XA Allergy, unspecified, initial encounter: Secondary | ICD-10-CM | POA: Diagnosis not present

## 2023-10-14 DIAGNOSIS — Z7982 Long term (current) use of aspirin: Secondary | ICD-10-CM | POA: Diagnosis not present

## 2023-10-14 DIAGNOSIS — R21 Rash and other nonspecific skin eruption: Secondary | ICD-10-CM | POA: Diagnosis present

## 2023-10-14 MED ORDER — DIPHENHYDRAMINE HCL 25 MG PO CAPS
50.0000 mg | ORAL_CAPSULE | Freq: Once | ORAL | Status: AC
  Administered 2023-10-14: 50 mg via ORAL
  Filled 2023-10-14: qty 2

## 2023-10-14 MED ORDER — PREDNISONE 50 MG PO TABS
60.0000 mg | ORAL_TABLET | Freq: Once | ORAL | Status: AC
  Administered 2023-10-14: 60 mg via ORAL
  Filled 2023-10-14: qty 1

## 2023-10-14 MED ORDER — FAMOTIDINE 20 MG PO TABS
20.0000 mg | ORAL_TABLET | Freq: Once | ORAL | Status: AC
  Administered 2023-10-14: 20 mg via ORAL
  Filled 2023-10-14: qty 1

## 2023-10-14 NOTE — ED Triage Notes (Signed)
Pt took antibiotic yesterday.  Pt then broke out in rash and states he felt itchy.  Pt took benadryl yesterday.  Today he noticed a white patch on his tongue.  Pt also states he feels like his tonsils are swollen.  Pt took benadryl at 1530.  Airway patent, no signs of swelling.  Pt is concerned about sitting in the lobby.  Encouraged to wear a mask.

## 2023-10-15 MED ORDER — PREDNISONE 10 MG (21) PO TBPK
ORAL_TABLET | ORAL | 0 refills | Status: DC
Start: 2023-10-15 — End: 2023-12-08

## 2023-10-15 NOTE — ED Provider Notes (Signed)
 [place] [facility]  Provider Note  CSN: [id number] Arrival date & time: 10/14/23 1831  History Chief Complaint  Patient presents with   Medication Reaction    Joseph Lang is a 47 y.o. male reports he took some bactrim  for the last 2 days after having a urine that smelled strong. He had left over Rx from prior kidney stone/UTI. He developed itching rash on hands and trunk. He stopped taking the bactrim  but rash did not resolve with benadryl  at home. He noticed a white spot on his tongue, but otherwise has not had any trouble breathing or swallowing. No further urinary symptoms or fever.    Home Medications Prior to Admission medications   Medication Sig Start Date End Date Taking? Authorizing Provider  predniSONE  (STERAPRED UNI-PAK 21 TAB) 10 MG (21) TBPK tablet 10mg  Tabs, 6 day taper. Use as directed 10/15/23  Yes Roselyn Carlin NOVAK, MD  amiodarone  (PACERONE ) 200 MG tablet Take 0.5 tablets (100 mg total) by mouth daily. 06/10/23   Ladona Heinz, MD  aspirin  EC 81 MG tablet Take 1 tablet (81 mg total) by mouth daily. 04/23/20   Ladona Heinz, MD  atorvastatin  (LIPITOR ) 80 MG tablet Take 1 tablet (80 mg total) by mouth daily. 06/10/23   Ladona Heinz, MD  benazepril  (LOTENSIN ) 10 MG tablet Take 1 tablet (10 mg total) by mouth daily. 06/10/23   Ladona Heinz, MD  Cholecalciferol (VITAMIN D3) 75 MCG (3000 UT) TABS Take 1 tablet by mouth daily.    [provider]  isosorbide  mononitrate (IMDUR ) 30 MG 24 hr tablet Take 0.5 tablets (15 mg total) by mouth daily. 07/05/23   Ladona Heinz, MD  metoprolol  tartrate (LOPRESSOR ) 50 MG tablet Take 1 tablet (50 mg total) by mouth 2 (two) times daily. 06/10/23   Ladona Heinz, MD  ondansetron  (ZOFRAN -ODT) 4 MG disintegrating tablet Take 1 tablet (4 mg total) by mouth every 8 (eight) hours as needed for nausea or vomiting. 09/22/23   Veta Palma, PA-C  RABEprazole (ACIPHEX) 20 MG tablet Take 20 mg by mouth daily.  01/04/20    [provider]  Semaglutide ,0.25 or 0.5MG /DOS, 2 MG/3ML SOPN Inject 0.25 mg into the skin once a week. 06/10/23   Ladona Heinz, MD  tamsulosin  (FLOMAX ) 0.4 MG CAPS capsule Take 1 capsule (0.4 mg total) by mouth daily. 09/22/23   Franaszek, Amanda, PA-C  traZODone  (DESYREL ) 100 MG tablet Take 1 tablet (100 mg total) by mouth at bedtime. 06/10/23   Ladona Heinz, MD  XARELTO  20 MG TABS tablet Take 1 tablet (20 mg total) by mouth daily with supper. 06/10/23   Ladona Heinz, MD     Allergies    Amoxicillin, Doxycycline hyclate, and Sulfamethoxazole -trimethoprim    Review of Systems   Review of Systems Please see HPI for pertinent positives and negatives  Physical Exam BP (!) 154/76 (BP Location: Right Arm)   Pulse 64   Temp 97.8 F (36.6 C) (Oral)   Resp 18   Ht 6' 2 (1.88 m)   Wt (!) 190.5 kg   SpO2 98%   BMI 53.92 kg/m   Physical Exam Vitals and nursing note reviewed.  Constitutional:      Appearance: Normal appearance.  HENT:     Head: Normocephalic and atraumatic.     Nose: Nose normal.     Mouth/Throat:     Mouth: Mucous membranes are moist.     Comments: Small area of geographic tongue, otherwise normal airway Eyes:  Extraocular Movements: Extraocular movements intact.     Conjunctiva/sclera: Conjunctivae normal.  Cardiovascular:     Rate and Rhythm: Normal rate.  Pulmonary:     Effort: Pulmonary effort is normal.     Breath sounds: Normal breath sounds.  Abdominal:     General: Abdomen is flat.     Palpations: Abdomen is soft.     Tenderness: There is no abdominal tenderness.  Musculoskeletal:        General: No swelling. Normal range of motion.     Cervical back: Neck supple.  Skin:    General: Skin is warm and dry.     Findings: Rash (occasional hives on trunk) present.  Neurological:     General: No focal deficit present.     Mental Status: He is alert.  Psychiatric:        Mood and Affect: Mood normal.     ED Results / Procedures / Treatments    EKG None  Procedures Procedures  Medications Ordered in the ED Medications  diphenhydrAMINE  (BENADRYL ) capsule 50 mg (50 mg Oral Given 10/14/23 1903)  famotidine  (PEPCID ) tablet 20 mg (20 mg Oral Given 10/14/23 1903)  predniSONE  (DELTASONE ) tablet 60 mg (60 mg Oral Given 10/14/23 1903)    Initial Impression and Plan  Patient here with allergic reaction, likely to recent bactrim  use. Advised to stop taking it. He was given antihistamines and steroids in triage. Has not had any progression and no signs of airway involvement. Plan discharge with Rx for prednisone , continue OTC antihistamines. RTED for any worsening.   ED Course       MDM Rules/Calculators/A&P Medical Decision Making Problems Addressed: Allergic reaction, initial encounter: acute illness or injury  Risk Prescription drug management.     Final Clinical Impression(s) / ED Diagnoses Final diagnoses:  Allergic reaction, initial encounter    Rx / DC Orders ED Discharge Orders          Ordered    predniSONE  (STERAPRED UNI-PAK 21 TAB) 10 MG (21) TBPK tablet        10/15/23 0028             Roselyn Carlin NOVAK, MD 10/15/23 [phone]

## 2023-11-02 ENCOUNTER — Emergency Department (HOSPITAL_COMMUNITY)
Admission: EM | Admit: 2023-11-02 | Discharge: 2023-11-02 | Disposition: A | Discharge status: Home / Self Care | Payer: Medicaid Other | Attending: Emergency Medicine | Admitting: Emergency Medicine

## 2023-11-02 ENCOUNTER — Other Ambulatory Visit: Payer: Self-pay

## 2023-11-02 ENCOUNTER — Emergency Department (HOSPITAL_COMMUNITY): Payer: Medicaid Other

## 2023-11-02 ENCOUNTER — Encounter (HOSPITAL_COMMUNITY): Payer: Self-pay

## 2023-11-02 DIAGNOSIS — I2510 Atherosclerotic heart disease of native coronary artery without angina pectoris: Secondary | ICD-10-CM | POA: Diagnosis not present

## 2023-11-02 DIAGNOSIS — Z951 Presence of aortocoronary bypass graft: Secondary | ICD-10-CM | POA: Diagnosis not present

## 2023-11-02 DIAGNOSIS — E889 Metabolic disorder, unspecified: Secondary | ICD-10-CM | POA: Insufficient documentation

## 2023-11-02 DIAGNOSIS — R0789 Other chest pain: Secondary | ICD-10-CM | POA: Diagnosis present

## 2023-11-02 DIAGNOSIS — K859 Acute pancreatitis without necrosis or infection, unspecified: Secondary | ICD-10-CM | POA: Insufficient documentation

## 2023-11-02 DIAGNOSIS — D72829 Elevated white blood cell count, unspecified: Secondary | ICD-10-CM | POA: Diagnosis not present

## 2023-11-02 DIAGNOSIS — Z87891 Personal history of nicotine dependence: Secondary | ICD-10-CM | POA: Insufficient documentation

## 2023-11-02 LAB — PROTIME-INR
INR: 1.9 — ABNORMAL HIGH (ref 0.8–1.2)
Prothrombin Time: 21.8 s — ABNORMAL HIGH (ref 11.4–15.2)

## 2023-11-02 LAB — HEPATIC FUNCTION PANEL
ALT: 24 U/L (ref 0–44)
AST: 20 U/L (ref 15–41)
Albumin: 2.6 g/dL — ABNORMAL LOW (ref 3.5–5.0)
Alkaline Phosphatase: 52 U/L (ref 38–126)
Bilirubin, Direct: 0.3 mg/dL — ABNORMAL HIGH (ref 0.0–0.2)
Indirect Bilirubin: 0.7 mg/dL (ref 0.3–0.9)
Total Bilirubin: 1 mg/dL (ref 0.0–1.2)
Total Protein: 4.9 g/dL — ABNORMAL LOW (ref 6.5–8.1)

## 2023-11-02 LAB — CBC
HCT: 44.7 % (ref 39.0–52.0)
Hemoglobin: 14.8 g/dL (ref 13.0–17.0)
MCH: 28.2 pg (ref 26.0–34.0)
MCHC: 33.1 g/dL (ref 30.0–36.0)
MCV: 85.1 fL (ref 80.0–100.0)
Platelets: 308 10*3/uL (ref 150–400)
RBC: 5.25 MIL/uL (ref 4.22–5.81)
RDW: 13.6 % (ref 11.5–15.5)
WBC: 13.3 10*3/uL — ABNORMAL HIGH (ref 4.0–10.5)
nRBC: 0 % (ref 0.0–0.2)

## 2023-11-02 LAB — BASIC METABOLIC PANEL
Anion gap: 13 (ref 5–15)
BUN: 10 mg/dL (ref 6–20)
CO2: 18 mmol/L — ABNORMAL LOW (ref 22–32)
Calcium: 9.7 mg/dL (ref 8.9–10.3)
Chloride: 105 mmol/L (ref 98–111)
Creatinine, Ser: 0.75 mg/dL (ref 0.61–1.24)
GFR, Estimated: >60 mL/min (ref >60)
Glucose, Bld: 120 mg/dL — ABNORMAL HIGH (ref 70–99)
Potassium: 4 mmol/L (ref 3.5–5.1)
Sodium: 136 mmol/L (ref 135–145)

## 2023-11-02 LAB — TROPONIN I (HIGH SENSITIVITY)
Troponin I (High Sensitivity): 7 ng/L (ref <18)
Troponin I (High Sensitivity): 4 ng/L (ref <18)

## 2023-11-02 LAB — LIPASE, BLOOD: Lipase: 3265 U/L — ABNORMAL HIGH (ref 11–51)

## 2023-11-02 MED ORDER — ALUM & MAG HYDROXIDE-SIMETH 200-200-20 MG/5ML PO SUSP
30.0000 mL | Freq: Once | ORAL | Status: AC
  Administered 2023-11-02: 30 mL via ORAL
  Filled 2023-11-02: qty 30

## 2023-11-02 MED ORDER — ONDANSETRON HCL 4 MG/2ML IJ SOLN
4.0000 mg | Freq: Once | INTRAMUSCULAR | Status: AC
  Administered 2023-11-02: 4 mg via INTRAVENOUS
  Filled 2023-11-02: qty 2

## 2023-11-02 MED ORDER — ASPIRIN 325 MG PO TABS
325.0000 mg | ORAL_TABLET | Freq: Every day | ORAL | Status: DC
  Filled 2023-11-02: qty 1

## 2023-11-02 MED ORDER — NITROGLYCERIN 0.4 MG SL SUBL: 0.4000 mg | SUBLINGUAL_TABLET | SUBLINGUAL | Status: DC | PRN

## 2023-11-02 MED ORDER — FENTANYL CITRATE PF 50 MCG/ML IJ SOSY
100.0000 ug | PREFILLED_SYRINGE | Freq: Once | INTRAMUSCULAR | Status: AC
Start: 2023-11-02 — End: 2023-11-02
  Administered 2023-11-02: 100 ug via INTRAVENOUS
  Filled 2023-11-02: qty 2

## 2023-11-02 MED ORDER — IOHEXOL 350 MG/ML SOLN
100.0000 mL | Freq: Once | INTRAVENOUS | Status: AC | PRN
  Administered 2023-11-02: 100 mL via INTRAVENOUS

## 2023-11-02 MED ORDER — MORPHINE SULFATE (PF) 4 MG/ML IV SOLN: 4.0000 mg | Freq: Once | INTRAVENOUS | Status: DC

## 2023-11-02 MED ORDER — OXYCODONE-ACETAMINOPHEN 5-325 MG PO TABS: 1.0000 | ORAL_TABLET | Freq: Four times a day (QID) | ORAL | 0 refills | Status: DC | PRN

## 2023-11-02 MED ORDER — SODIUM CHLORIDE 0.9 % IV BOLUS
1000.0000 mL | Freq: Once | INTRAVENOUS | Status: AC
Start: 2023-11-02 — End: 2023-11-02
  Administered 2023-11-02: 1000 mL via INTRAVENOUS

## 2023-11-02 MED ORDER — ONDANSETRON HCL 4 MG PO TABS: 4.0000 mg | ORAL_TABLET | Freq: Four times a day (QID) | ORAL | 0 refills | Status: DC | PRN

## 2023-11-02 NOTE — ED Triage Notes (Signed)
Pt bib [facility] c.o chest pain that started this morning. Pain radiates to his abd. Pt given 4mg  zofran PTA for nausea.

## 2023-11-02 NOTE — ED Notes (Signed)
Pt states he took 4 baby ASA prior to EMS arrival

## 2023-11-02 NOTE — ED Notes (Signed)
Pt refuses any further attempts for 2nd trop difficult stick rn gabe aware at

## 2023-11-02 NOTE — ED Provider Notes (Signed)
[place] EMERGENCY DEPARTMENT AT [facility] Provider Note   CSN: [id number] Arrival date & time: 11/02/23  0856     History  Chief Complaint  Patient presents with   Chest Pain    Joseph Lang is a 48 y.o. male with medical history of status post CABG x 2 in 2021, CAD, GERD, history of PE, history of gastric bypass, tobacco abuse.  Patient presents to ED for evaluation of chest pain.  Reports that he went to bed at 5 AM this morning and woke up at 8 AM with chest pain located in his mid sternum that radiates slightly to left side of his chest and down to the abdomen.  Reports that he has had episodes of indigestion in the past but not as severe as this.  He reports significant chest pain located in the middle of his chest.  Denies shortness of breath, lightheadedness, dizziness, weakness.  He is endorsing nausea without vomiting.  Denies any diarrhea, fevers at home.  Denies dysuria.  Denies any leg swelling.  Reports compliance on all medications to include Xarelto.  Patient states that he feels as if he needs to use the bathroom, have a bowel movement, but is able to do so.  He is continuously belching during my examination.   Chest Pain Associated symptoms: abdominal pain, nausea and vomiting   Associated symptoms: no dizziness, no fever, no shortness of breath and no weakness        Home Medications Prior to Admission medications   Medication Sig Start Date End Date Taking? Authorizing Provider  ondansetron (ZOFRAN) 4 MG tablet Take 1 tablet (4 mg total) by mouth every 6 (six) hours as needed for nausea or vomiting. 11/02/23  Yes Al Decant, PA-C  oxyCODONE-acetaminophen (PERCOCET/ROXICET) 5-325 MG tablet Take 1 tablet by mouth every 6 (six) hours as needed for severe pain (pain score 7-10). 11/02/23  Yes Al Decant, PA-C  amiodarone (PACERONE) 200 MG tablet Take 0.5 tablets (100 mg total) by mouth daily. 06/10/23   Yates Decamp, MD  aspirin EC 81 MG  tablet Take 1 tablet (81 mg total) by mouth daily. 04/23/20   Yates Decamp, MD  atorvastatin (LIPITOR) 80 MG tablet Take 1 tablet (80 mg total) by mouth daily. 06/10/23   Yates Decamp, MD  benazepril (LOTENSIN) 10 MG tablet Take 1 tablet (10 mg total) by mouth daily. 06/10/23   Yates Decamp, MD  Cholecalciferol (VITAMIN D3) 75 MCG (3000 UT) TABS Take 1 tablet by mouth daily.    [provider]  isosorbide mononitrate (IMDUR) 30 MG 24 hr tablet Take 0.5 tablets (15 mg total) by mouth daily. 07/05/23   Yates Decamp, MD  metoprolol tartrate (LOPRESSOR) 50 MG tablet Take 1 tablet (50 mg total) by mouth 2 (two) times daily. 06/10/23   Yates Decamp, MD  ondansetron (ZOFRAN-ODT) 4 MG disintegrating tablet Take 1 tablet (4 mg total) by mouth every 8 (eight) hours as needed for nausea or vomiting. 09/22/23   Arabella Merles, PA-C  predniSONE (STERAPRED UNI-PAK 21 TAB) 10 MG (21) TBPK tablet 10mg  Tabs, 6 day taper. Use as directed 10/15/23   Pollyann Savoy, MD  RABEprazole (ACIPHEX) 20 MG tablet Take 20 mg by mouth daily.  01/04/20   [provider]  Semaglutide,0.25 or 0.5MG /DOS, 2 MG/3ML SOPN Inject 0.25 mg into the skin once a week. 06/10/23   Yates Decamp, MD  tamsulosin (FLOMAX) 0.4 MG CAPS capsule Take 1 capsule (0.4 mg total) by  mouth daily. 09/22/23   Arabella Merles, PA-C  traZODone (DESYREL) 100 MG tablet Take 1 tablet (100 mg total) by mouth at bedtime. 06/10/23   Yates Decamp, MD  XARELTO 20 MG TABS tablet Take 1 tablet (20 mg total) by mouth daily with supper. 06/10/23   Yates Decamp, MD      Allergies    Amoxicillin, Doxycycline hyclate, and Sulfamethoxazole-trimethoprim    Review of Systems   Review of Systems  Constitutional:  Negative for fever.  Respiratory:  Negative for shortness of breath.   Cardiovascular:  Positive for chest pain.  Gastrointestinal:  Positive for abdominal pain, nausea and vomiting. Negative for diarrhea.  Neurological:  Negative for dizziness, weakness and  light-headedness.  All other systems reviewed and are negative.   Physical Exam Updated Vital Signs BP (!) 144/69   Pulse (!) 58   Temp 97.9 F (36.6 C) (Oral)   Resp 16   SpO2 95%  Physical Exam Vitals and nursing note reviewed.  Constitutional:      General: He is not in acute distress.    Appearance: Normal appearance. He is not ill-appearing, toxic-appearing or diaphoretic.  HENT:     Head: Normocephalic and atraumatic.     Nose: Nose normal.     Mouth/Throat:     Mouth: Mucous membranes are moist.     Pharynx: Oropharynx is clear.  Eyes:     Extraocular Movements: Extraocular movements intact.     Conjunctiva/sclera: Conjunctivae normal.     Pupils: Pupils are equal, round, and reactive to light.  Cardiovascular:     Rate and Rhythm: Normal rate and regular rhythm.  Pulmonary:     Breath sounds: Normal breath sounds. No wheezing.  Abdominal:     General: Abdomen is flat.     Tenderness: There is abdominal tenderness.  Musculoskeletal:     Cervical back: Normal range of motion and neck supple. No tenderness.  Skin:    General: Skin is warm and dry.     Capillary Refill: Capillary refill takes less than 2 seconds.  Neurological:     Mental Status: He is alert and oriented to person, place, and time.     ED Results / Procedures / Treatments   Labs (all labs ordered are listed, but only abnormal results are displayed) Labs Reviewed  BASIC METABOLIC PANEL - Abnormal; Notable for the following components:      Result Value   CO2 18 (*)    Glucose, Bld 120 (*)    All other components within normal limits  CBC - Abnormal; Notable for the following components:   WBC 13.3 (*)    All other components within normal limits  PROTIME-INR - Abnormal; Notable for the following components:   Prothrombin Time 21.8 (*)    INR 1.9 (*)    All other components within normal limits  LIPASE, BLOOD - Abnormal; Notable for the following components:   Lipase 3,265 (*)    All  other components within normal limits  HEPATIC FUNCTION PANEL - Abnormal; Notable for the following components:   Total Protein 4.9 (*)    Albumin 2.6 (*)    Bilirubin, Direct 0.3 (*)    All other components within normal limits  TROPONIN I (HIGH SENSITIVITY)  TROPONIN I (HIGH SENSITIVITY)    EKG EKG Interpretation Date/Time:  Saturday November 02 2023 09:02:54 EST Ventricular Rate:  51 PR Interval:  170 QRS Duration:  88 QT Interval:  410 QTC Calculation: 377 R Axis:  68  Text Interpretation: Sinus bradycardia with ventricular escape complexes Low voltage QRS Cannot rule out Anterior infarct , age undetermined Abnormal ECG When compared with ECG of 28-Feb-2023 19:00, PREVIOUS ECG IS PRESENT unchanged from prior Confirmed by Eber Hong ([id number]) on 11/02/2023 9:46:17 AM  Radiology CT Angio Chest/Abd/Pel for Dissection W and/or Wo Contrast Result Date: 11/02/2023 CLINICAL DATA:  Chest pain radiating to abdomen beginning this morning with nausea. Possible acute aortic syndrome. EXAM: CT ANGIOGRAPHY CHEST, ABDOMEN AND PELVIS TECHNIQUE: Non-contrast CT of the chest was initially obtained. Multidetector CT imaging through the chest, abdomen and pelvis was performed using the standard protocol during bolus administration of intravenous contrast. Multiplanar reconstructed images and MIPs were obtained and reviewed to evaluate the vascular anatomy. RADIATION DOSE REDUCTION: This exam was performed according to the departmental dose-optimization program which includes automated exposure control, adjustment of the mA and/or kV according to patient size and/or use of iterative reconstruction technique. CONTRAST:  OMNIPAQUE IOHEXOL 350 MG/ML SOLN COMPARISON:  CT urogram 09/22/2023, CT chest 04/08/2015 FINDINGS: CTA CHEST FINDINGS Cardiovascular: Median sternotomy wires are present. Heart is normal size. There is calcified plaque over the left main and 3 vessel coronary arteries. Thoracic aorta is  normal in caliber without evidence of aneurysm or dissection. Pulmonary system. Pulmonary arterial system is normal. Remaining vascular structures are unremarkable. Mediastinum/Nodes: No evidence of mediastinal or hilar adenopathy. Remaining mediastinal structures are normal. Lungs/Pleura: Lungs are adequately inflated. No evidence of acute airspace opacification or effusion. No concerning pulmonary nodules/masses. Airways are normal. Musculoskeletal: No focal bony abnormality. Fat necrosis over the subcutaneous tissues of the anterior chest wall just right of midline. Review of the MIP images confirms the above findings. CTA ABDOMEN AND PELVIS FINDINGS VASCULAR Aorta: Mild calcified plaque over the abdominal aorta which is normal in caliber. No evidence of aneurysm or dissection. Celiac: Mild calcified plaque at the origin of the celiac artery. No significant stenosis or occlusion. SMA: Patent without evidence of aneurysm, dissection, vasculitis or significant stenosis. Renals: Both renal arteries are patent without evidence of aneurysm, dissection, vasculitis, fibromuscular dysplasia or significant stenosis. IMA: Patent without evidence of aneurysm, dissection, vasculitis or significant stenosis. Inflow: Minimal calcified plaque over the common iliac arteries and internal iliac arteries. External iliac arteries are patent without significant stenosis or occlusion. Veins: No obvious venous abnormality within the limitations of this arterial phase study. Review of the MIP images confirms the above findings. NON-VASCULAR Hepatobiliary: Liver, gallbladder and biliary tree are unremarkable. Pancreas: There is stranding/fluid of the peripancreatic tissues and fat planes likely due to acute pancreatitis. No evidence of pancreatic necrosis. Spleen: Normal. Adrenals/Urinary Tract: Adrenal glands are normal and symmetric. Kidneys are normal size without hydronephrosis or nephrolithiasis. Ureters and bladder are normal.  Stomach/Bowel: Suture line over the stomach. Small bowel is normal. Appendix is normal. Minimal diverticulosis of the colon. Lymphatic: No evidence of adenopathy. Reproductive: Normal. Other: None. Musculoskeletal: No focal bony abnormality. Hardware over the right acetabulum appears intact. Degenerative change of the right hip and spine. Review of the MIP images confirms the above findings. IMPRESSION: 1. No evidence of thoracoabdominal aortic aneurysm or dissection. 2. Stranding/fluid of the peripancreatic tissues and fat planes likely due to acute pancreatitis. No evidence of pancreatic necrosis. 3. Aortic atherosclerosis. Evidence of prior median sternotomy. Atherosclerotic coronary artery disease. 4. Minimal diverticulosis of the colon. Aortic Atherosclerosis (ICD10-I70.0). Electronically Signed   By: Elberta Fortis M.D.   On: 11/02/2023 12:27   DG Chest Portable 1 View Result Date:  11/02/2023 CLINICAL DATA:  Chest pain since this morning with nausea and vomiting. EXAM: PORTABLE CHEST 1 VIEW COMPARISON:  03/01/2023 FINDINGS: Sternotomy wires unchanged. Lungs are adequately inflated without focal airspace consolidation or effusion. Cardiomediastinal silhouette and remainder of the exam is unchanged. IMPRESSION: No active disease. Electronically Signed   By: Elberta Fortis M.D.   On: 11/02/2023 09:50    Procedures Procedures   Medications Ordered in ED Medications  aspirin tablet 325 mg (325 mg Oral Not Given 11/02/23 1004)  alum & mag hydroxide-simeth (MAALOX/MYLANTA) 200-200-20 MG/5ML suspension 30 mL (30 mLs Oral Given 11/02/23 1003)  ondansetron (ZOFRAN) injection 4 mg (4 mg Intravenous Given 11/02/23 1058)  fentaNYL (SUBLIMAZE) injection 100 mcg (100 mcg Intravenous Given 11/02/23 1058)  iohexol (OMNIPAQUE) 350 MG/ML injection 100 mL (100 mLs Intravenous Contrast Given 11/02/23 1147)  sodium chloride 0.9 % bolus 1,000 mL (1,000 mLs Intravenous New Bag/Given 11/02/23 1333)    ED Course/ Medical  Decision Making/ A&P Clinical Course as of 11/02/23 1520  Sat Nov 02, 2023  1245 Reassessed, pain more well controlled. [CG]    Clinical Course User Index [CG] Al Decant, PA-C   Medical Decision Making Amount and/or Complexity of Data Reviewed Labs: ordered. Radiology: ordered.  Risk OTC drugs. Prescription drug management.   48 year old who presents for evaluation.  Please see HPI for further details.  On examination the patient is afebrile and nontachycardic.  Lung sounds are clear bilaterally, he is not hypoxic.  His abdomen has tenderness throughout, no overlying skin change.  No rebound or guarding.  Neurological examination is at baseline.  Patient is in distress.  Reports that his chest is in a significant amount of pain, asking for pain medication.  Due to patient risk factors, presentation I am concerned for possible aortic dissection.  Patient appears extremely uncomfortable.  Will collect troponins, CBC, BMP, BMP with hepatic function panel, lipase, CTA chest abdomen pelvis and chest x-ray.  Patient provided with pain control, GI cocktail, Zofran and 1 L of fluid.  Patient CBC with leukocytosis of 13.3, no anemia.  Metabolic panel shows bicarb 18, glucose 120.  No other electrolyte derangement.  Hepatic function panel shows decreased albumin but no other abnormal LFTs.  Lipase elevated to 3265.  PT/INR appropriately elevated as patient is on anticoagulation.  Chest x-ray clear.  CTA shows no evidence of dissection, PE however there is noted to be pancreatitis.  This correlates with patient elevated lipase.  At this time, after patient was provided pain medication and Zofran his pain has been well-controlled as well as his nausea.  He has had no vomiting while here in the department.  He reports that he prefers discharge as he states that his mother is at home and he is her primary caregiver.  At this time patient we discharged home with pain medication,  antinausea medication.  Advised to follow-up with his PCP this week.  Encouraged to return to the ED with any new or worsening signs or symptoms or pain that is not well-controlled on oral medications.  He voiced understanding.  Stable to discharge home.   Final Clinical Impression(s) / ED Diagnoses Final diagnoses:  Atypical chest pain  Acute pancreatitis without infection or necrosis, unspecified pancreatitis type    Rx / DC Orders ED Discharge Orders          Ordered    oxyCODONE-acetaminophen (PERCOCET/ROXICET) 5-325 MG tablet  Every 6 hours PRN        11/02/23 1519  ondansetron (ZOFRAN) 4 MG tablet  Every 6 hours PRN        11/02/23 1519              Clent Ridges 11/02/23 1520    Eber Hong, MD 11/03/23 [phone]

## 2023-11-02 NOTE — ED Provider Notes (Signed)
This patient is a 48 year old male known history of paroxysmal A-fib as well as coronary artery disease status post bypass grafting on Xarelto presenting with pain that started about 2 hours ago, he had gone to bed at 5:00 this morning symptom-free and woke up with an intense epigastric and lower chest discomfort no history of pancreatitis and on my exam he is not tender in the abdomen, he is morbidly obese his vital signs are rather unremarkable but he appears to be in significant discomfort.  Pain meds will be given and a workup will be started to look for causes of the pain including aortic dissection pulmonary embolism and acute coronary syndrome.  This could be gastrointestinal if no other causes are found but the patient appears extremely uncomfortable and is high risk based on his known coronary anatomy and risk factors  Interestingly the workup confirms that the patient actually has pancreatitis which makes much more sense regarding the location of the patient's pain.  He was not vomiting, he was not tachycardic, rather his vital signs look great and on the CT scan there was no concerning evidence of a necrotizing process.  The patient is stable for discharge   Eber Hong, MD 11/03/23 [phone]

## 2023-11-02 NOTE — ED Notes (Signed)
Multiple attempts at IV access without success

## 2023-11-02 NOTE — Discharge Instructions (Addendum)
It was a pleasure taking part in your care.  As we discussed, you have pancreatitis.  This should resolve over time with bowel rest and fluids.  Please read the attached guides concerning acute pancreatitis as well as a pancreatitis eating plan.  Please take oxycodone 5 mg every 6 hours as needed for pain.  Please take 4 mg of Zofran every 6 hours as needed for nausea and vomiting.  Please follow-up with your PCP for reevaluation.  Please return to the ED with any new or worsening symptoms.

## 2023-11-19 DIAGNOSIS — Z0271 Encounter for disability determination: Secondary | ICD-10-CM

## 2023-12-08 ENCOUNTER — Inpatient Hospital Stay (HOSPITAL_COMMUNITY): Payer: Medicaid Other | Admitting: Certified Registered Nurse Anesthetist

## 2023-12-08 ENCOUNTER — Emergency Department (HOSPITAL_COMMUNITY): Payer: Medicaid Other

## 2023-12-08 ENCOUNTER — Inpatient Hospital Stay (HOSPITAL_COMMUNITY)
Admission: EM | Admit: 2023-12-08 | Discharge: 2023-12-09 | DRG: 309 | Disposition: A | Discharge status: Home / Self Care | Payer: Medicaid Other | Attending: Internal Medicine | Admitting: Internal Medicine

## 2023-12-08 ENCOUNTER — Other Ambulatory Visit: Payer: Self-pay

## 2023-12-08 ENCOUNTER — Encounter (HOSPITAL_COMMUNITY): Payer: Self-pay | Admitting: Emergency Medicine

## 2023-12-08 DIAGNOSIS — Z880 Allergy status to penicillin: Secondary | ICD-10-CM

## 2023-12-08 DIAGNOSIS — Z8672 Personal history of thrombophlebitis: Secondary | ICD-10-CM

## 2023-12-08 DIAGNOSIS — Z8249 Family history of ischemic heart disease and other diseases of the circulatory system: Secondary | ICD-10-CM

## 2023-12-08 DIAGNOSIS — G4733 Obstructive sleep apnea (adult) (pediatric): Secondary | ICD-10-CM | POA: Diagnosis present

## 2023-12-08 DIAGNOSIS — I472 Ventricular tachycardia, unspecified: Secondary | ICD-10-CM | POA: Diagnosis present

## 2023-12-08 DIAGNOSIS — F17290 Nicotine dependence, other tobacco product, uncomplicated: Secondary | ICD-10-CM | POA: Diagnosis present

## 2023-12-08 DIAGNOSIS — Z6841 Body Mass Index (BMI) 40.0 and over, adult: Secondary | ICD-10-CM

## 2023-12-08 DIAGNOSIS — I4891 Unspecified atrial fibrillation: Principal | ICD-10-CM | POA: Diagnosis present

## 2023-12-08 DIAGNOSIS — Z7982 Long term (current) use of aspirin: Secondary | ICD-10-CM | POA: Diagnosis not present

## 2023-12-08 DIAGNOSIS — Z951 Presence of aortocoronary bypass graft: Secondary | ICD-10-CM | POA: Diagnosis not present

## 2023-12-08 DIAGNOSIS — Z86718 Personal history of other venous thrombosis and embolism: Secondary | ICD-10-CM | POA: Diagnosis not present

## 2023-12-08 DIAGNOSIS — I4892 Unspecified atrial flutter: Secondary | ICD-10-CM

## 2023-12-08 DIAGNOSIS — I4819 Other persistent atrial fibrillation: Secondary | ICD-10-CM | POA: Diagnosis not present

## 2023-12-08 DIAGNOSIS — Z79899 Other long term (current) drug therapy: Secondary | ICD-10-CM

## 2023-12-08 DIAGNOSIS — Z83438 Family history of other disorder of lipoprotein metabolism and other lipidemia: Secondary | ICD-10-CM | POA: Diagnosis not present

## 2023-12-08 DIAGNOSIS — R609 Edema, unspecified: Secondary | ICD-10-CM | POA: Diagnosis present

## 2023-12-08 DIAGNOSIS — E7849 Other hyperlipidemia: Secondary | ICD-10-CM | POA: Diagnosis present

## 2023-12-08 DIAGNOSIS — Z7985 Long-term (current) use of injectable non-insulin antidiabetic drugs: Secondary | ICD-10-CM

## 2023-12-08 DIAGNOSIS — I10 Essential (primary) hypertension: Secondary | ICD-10-CM | POA: Diagnosis present

## 2023-12-08 DIAGNOSIS — Z9884 Bariatric surgery status: Secondary | ICD-10-CM

## 2023-12-08 DIAGNOSIS — I2510 Atherosclerotic heart disease of native coronary artery without angina pectoris: Secondary | ICD-10-CM

## 2023-12-08 DIAGNOSIS — I484 Atypical atrial flutter: Secondary | ICD-10-CM | POA: Diagnosis not present

## 2023-12-08 DIAGNOSIS — Z7901 Long term (current) use of anticoagulants: Secondary | ICD-10-CM

## 2023-12-08 DIAGNOSIS — Z86711 Personal history of pulmonary embolism: Secondary | ICD-10-CM

## 2023-12-08 DIAGNOSIS — I2489 Other forms of acute ischemic heart disease: Secondary | ICD-10-CM | POA: Diagnosis present

## 2023-12-08 DIAGNOSIS — Z881 Allergy status to other antibiotic agents status: Secondary | ICD-10-CM

## 2023-12-08 DIAGNOSIS — Z836 Family history of other diseases of the respiratory system: Secondary | ICD-10-CM

## 2023-12-08 DIAGNOSIS — K219 Gastro-esophageal reflux disease without esophagitis: Secondary | ICD-10-CM | POA: Diagnosis present

## 2023-12-08 DIAGNOSIS — Z833 Family history of diabetes mellitus: Secondary | ICD-10-CM | POA: Diagnosis not present

## 2023-12-08 LAB — CBC WITH DIFFERENTIAL/PLATELET
Abs Immature Granulocytes: 0.03 10*3/uL (ref 0.00–0.07)
Basophils Absolute: 0 10*3/uL (ref 0.0–0.1)
Basophils Relative: 0 %
Eosinophils Absolute: 0.3 10*3/uL (ref 0.0–0.5)
Eosinophils Relative: 3 %
HCT: 41.5 % (ref 39.0–52.0)
Hemoglobin: 13.8 g/dL (ref 13.0–17.0)
Immature Granulocytes: 0 %
Lymphocytes Relative: 26 %
Lymphs Abs: 2.4 10*3/uL (ref 0.7–4.0)
MCH: 28.3 pg (ref 26.0–34.0)
MCHC: 33.3 g/dL (ref 30.0–36.0)
MCV: 85.2 fL (ref 80.0–100.0)
Monocytes Absolute: 0.6 10*3/uL (ref 0.1–1.0)
Monocytes Relative: 7 %
Neutro Abs: 5.8 10*3/uL (ref 1.7–7.7)
Neutrophils Relative %: 64 %
Platelets: 238 10*3/uL (ref 150–400)
RBC: 4.87 MIL/uL (ref 4.22–5.81)
RDW: 14.3 % (ref 11.5–15.5)
WBC: 9.2 10*3/uL (ref 4.0–10.5)
nRBC: 0 % (ref 0.0–0.2)

## 2023-12-08 LAB — BASIC METABOLIC PANEL
Anion gap: 8 (ref 5–15)
BUN: 12 mg/dL (ref 6–20)
CO2: 23 mmol/L (ref 22–32)
Calcium: 9.5 mg/dL (ref 8.9–10.3)
Chloride: 110 mmol/L (ref 98–111)
Creatinine, Ser: 0.8 mg/dL (ref 0.61–1.24)
GFR, Estimated: >60 mL/min (ref >60)
Glucose, Bld: 122 mg/dL — ABNORMAL HIGH (ref 70–99)
Potassium: 3.9 mmol/L (ref 3.5–5.1)
Sodium: 141 mmol/L (ref 135–145)

## 2023-12-08 LAB — TROPONIN I (HIGH SENSITIVITY)
Troponin I (High Sensitivity): 13 ng/L (ref <18)
Troponin I (High Sensitivity): 22 ng/L — ABNORMAL HIGH (ref <18)
Troponin I (High Sensitivity): 17 ng/L (ref <18)

## 2023-12-08 LAB — MAGNESIUM: Magnesium: 2.1 mg/dL (ref 1.7–2.4)

## 2023-12-08 MED ORDER — AMIODARONE HCL IN DEXTROSE 360-4.14 MG/200ML-% IV SOLN
INTRAVENOUS | Status: AC
  Filled 2023-12-08: qty 200

## 2023-12-08 MED ORDER — POLYETHYLENE GLYCOL 3350 17 G PO PACK: 17.0000 g | PACK | Freq: Every day | ORAL | Status: DC | PRN

## 2023-12-08 MED ORDER — AMIODARONE HCL IN DEXTROSE 360-4.14 MG/200ML-% IV SOLN
60.0000 mg/h | INTRAVENOUS | Status: AC
  Administered 2023-12-08 (×2): 60 mg/h via INTRAVENOUS

## 2023-12-08 MED ORDER — AMIODARONE LOAD VIA INFUSION
150.0000 mg | Freq: Once | INTRAVENOUS | Status: DC
  Filled 2023-12-08: qty 83.34

## 2023-12-08 MED ORDER — DILTIAZEM LOAD VIA INFUSION
10.0000 mg | Freq: Once | INTRAVENOUS | Status: AC
  Administered 2023-12-08: 10 mg via INTRAVENOUS
  Filled 2023-12-08: qty 10

## 2023-12-08 MED ORDER — ONDANSETRON HCL 4 MG/2ML IJ SOLN: 4.0000 mg | Freq: Four times a day (QID) | INTRAMUSCULAR | Status: DC | PRN

## 2023-12-08 MED ORDER — ACETAMINOPHEN 325 MG PO TABS: 650.0000 mg | ORAL_TABLET | Freq: Four times a day (QID) | ORAL | Status: DC | PRN

## 2023-12-08 MED ORDER — DILTIAZEM HCL-DEXTROSE 125-5 MG/125ML-% IV SOLN (PREMIX)
5.0000 mg/h | INTRAVENOUS | Status: DC
  Administered 2023-12-08 (×2): 5 mg/h via INTRAVENOUS
  Filled 2023-12-08 (×2): qty 125

## 2023-12-08 MED ORDER — AMIODARONE IV BOLUS ONLY 150 MG/100ML
INTRAVENOUS | Status: AC
  Administered 2023-12-08: 150 mg
  Filled 2023-12-08: qty 100

## 2023-12-08 MED ORDER — LORAZEPAM 1 MG PO TABS: 1.0000 mg | ORAL_TABLET | Freq: Two times a day (BID) | ORAL | Status: DC | PRN

## 2023-12-08 MED ORDER — RIVAROXABAN 20 MG PO TABS
20.0000 mg | ORAL_TABLET | Freq: Every day | ORAL | Status: DC
  Administered 2023-12-08: 20 mg via ORAL
  Filled 2023-12-08: qty 1

## 2023-12-08 MED ORDER — SODIUM CHLORIDE 0.9% FLUSH: 3.0000 mL | Freq: Two times a day (BID) | INTRAVENOUS | Status: DC

## 2023-12-08 MED ORDER — AMIODARONE HCL IN DEXTROSE 360-4.14 MG/200ML-% IV SOLN
30.0000 mg/h | INTRAVENOUS | Status: DC
  Administered 2023-12-08 (×2): 30 mg/h via INTRAVENOUS
  Filled 2023-12-08 (×2): qty 200

## 2023-12-08 MED ORDER — PROPOFOL 10 MG/ML IV BOLUS
INTRAVENOUS | Status: DC | PRN
  Administered 2023-12-08: 50 mg via INTRAVENOUS
  Administered 2023-12-08: 200 mg via INTRAVENOUS

## 2023-12-08 MED ORDER — ALPRAZOLAM 0.5 MG PO TABS
0.5000 mg | ORAL_TABLET | Freq: Two times a day (BID) | ORAL | Status: DC | PRN
  Administered 2023-12-08 – 2023-12-09 (×2): 0.5 mg via ORAL
  Filled 2023-12-08: qty 1
  Filled 2023-12-08: qty 2
  Filled 2023-12-08: qty 1

## 2023-12-08 MED ORDER — ACETAMINOPHEN 650 MG RE SUPP: 650.0000 mg | Freq: Four times a day (QID) | RECTAL | Status: DC | PRN

## 2023-12-08 NOTE — Progress Notes (Signed)
 RT note. Patient placed on auto titrate CPAP. Patient states he wears CPAP at home. Currently on 18/6 auto titrate CPAP. Sat 97% resting comfortable. RT will continue to monitor. RN aware.   12/08/23 0728  BiPAP/CPAP/SIPAP  $ Non-Invasive Home Ventilator  Initial  $ Face Mask Large  Yes  BiPAP/CPAP/SIPAP Pt Type Adult  BiPAP/CPAP/SIPAP Resmed  Mask Type Full face mask  Mask Size Large  EPAP  (auto titrate 18/6)  FiO2 (%) 21 %  Patient Home Equipment No  Auto Titrate Yes  BiPAP/CPAP /SiPAP Vitals  Pulse Rate (!) 140  Resp (!) 23  SpO2 96 %  MEWS Score/Color  MEWS Score 4  MEWS Score Color Red

## 2023-12-08 NOTE — H&P (Addendum)
 History and Physical    Patient: Joseph Lang EXB:[id number] DOB: 05-Jun-1976 DOA: 12/08/2023 DOS: the patient was seen and examined on 12/08/2023 PCP: Georgann Housekeeper, MD  Patient coming from: Home  Chief Complaint:  Chief Complaint  Patient presents with   Palpitations   HPI: Joseph E Clearman is a 48 y.o. male with medical history significant of known atrial fibrillation for which patient has previously had a rhythm control strategy with cardioversion.  Patient is chronically anticoagulated.  Patient is also obese and the use of CPAP at home.  Patient was in his usual state of health till approximately 3 AM when he reports an abrupt onset of palpitations.  No chest pain no fever no shortness of breath no presyncope no leg swelling.  Patient came to the ER was found to have rapid ventricular rate as high as 158.  Patient blood pressure fluctuating between 149/133 and 110/92.  Patient has been started on diltiazem infusion.  Patient is pretty much asymptomatic at this time.  Heart rate varying between 110 and 130 resting.  Medical evaluation is sought.  Patient has also been started on amiodarone infusion.  Cardiology evaluation is pending   Review of Systems: As mentioned in the history of present illness. All other systems reviewed and are negative. Past Medical History:  Diagnosis Date   Coronary artery disease 2021   Dysrhythmia 2021   Afib   GERD (gastroesophageal reflux disease)    History of blood clots    Hx of deep vein thrombophlebitis of lower extremity    Hx of gastric bypass    sleeve   Hx of pulmonary embolus    Hypertension    Obesity    Other hyperlipidemia    PONV (postoperative nausea and vomiting)    Throwing up after wt loss surgery   S/P CABG x 2 06/28/2020: LIMA to LAD, Free Radial graft to D1. LAA ligation with 40 mm Atricure. 06/27/2020   Sleep apnea    Swelling of both lower extremities    Tobacco abuse    Past Surgical History:  Procedure Laterality Date    CARDIAC CATHETERIZATION Left 2021   CARDIOVERSION     CARDIOVERSION N/A 04/19/2020   Procedure: CARDIOVERSION;  Surgeon: Yates Decamp, MD;  Location: [facility] ENDOSCOPY;  Service: Cardiovascular;  Laterality: N/A;   CARDIOVERSION N/A 04/26/2020   Procedure: CARDIOVERSION;  Surgeon: Yates Decamp, MD;  Location: [facility] ENDOSCOPY;  Service: Cardiovascular;  Laterality: N/A;   CARDIOVERSION N/A 07/01/2020   Procedure: CARDIOVERSION;  Surgeon: Yates Decamp, MD;  Location: [facility] ENDOSCOPY;  Service: Cardiovascular;  Laterality: N/A;   CLIPPING OF ATRIAL APPENDAGE N/A 06/27/2020   Procedure: CLIPPING OF ATRIAL APPENDAGE USING ATRICURE CLIP SIZE 40;  Surgeon: Delight Ovens, MD;  Location: [facility] OR;  Service: Open Heart Surgery;  Laterality: N/A;   CORONARY ARTERY BYPASS GRAFT N/A 06/27/2020   Procedure: CORONARY ARTERY BYPASS GRAFTING (CABG) X TWO, USING LEFT INTERNAL MAMMARY ARTERY AND LEFT ARM RADIAL ARTERY;  Surgeon: Delight Ovens, MD;  Location: [facility] OR;  Service: Open Heart Surgery;  Laterality: N/A;   FRACTURE SURGERY  1995   pelvis   HIP SURGERY     LAPAROSCOPIC GASTRIC SLEEVE RESECTION N/A 02/14/2015   Procedure: LAPAROSCOPIC GASTRIC SLEEVE RESECTION WITH UPPER ENDOSCOPY;  Surgeon: Luretha Murphy, MD;  Location: [facility] ORS;  Service: General;  Laterality: N/A;   LEFT HEART CATH AND CORONARY ANGIOGRAPHY N/A 04/19/2020   Procedure: LEFT HEART CATH AND CORONARY ANGIOGRAPHY;  Surgeon: Yates Decamp, MD;  Location: [facility] INVASIVE CV LAB;  Service: Cardiovascular;  Laterality: N/A;   PELVIS CLOSED REDUCTION     RADIAL ARTERY HARVEST Left 06/27/2020   Procedure: LEFT RADIAL ARTERY HARVEST;  Surgeon: Delight Ovens, MD;  Location: [facility] OR;  Service: Open Heart Surgery;  Laterality: Left;   TEE WITHOUT CARDIOVERSION N/A 06/27/2020   Procedure: TRANSESOPHAGEAL ECHOCARDIOGRAM (TEE);  Surgeon: Delight Ovens, MD;  Location: [facility] OR;  Service: Open Heart Surgery;  Laterality: N/A;   TEE WITHOUT CARDIOVERSION N/A 07/01/2020    Procedure: TRANSESOPHAGEAL ECHOCARDIOGRAM (TEE);  Surgeon: Yates Decamp, MD;  Location: [facility] ENDOSCOPY;  Service: Cardiovascular;  Laterality: N/A;   Social History:  reports that he quit smoking about 3 years ago. His smoking use included cigarettes. He started smoking about 33 years ago. He has a 15 pack-year smoking history. He has never used smokeless tobacco. He reports that he does not currently use alcohol. He reports that he does not use drugs.  Allergies  Allergen Reactions   Amoxicillin Nausea And Vomiting   Doxycycline Hyclate Rash   Sulfamethoxazole-Trimethoprim Hives, Rash and Other (See Comments)    White oral lesions    Family History  Problem Relation Age of Onset   Atrial fibrillation Mother    CAD Mother    Heart disease Mother    Hypertension Mother    Hyperlipidemia Mother    Sleep apnea Mother    CAD Father    Atrial fibrillation Father    Diabetes Father    Hypertension Father    Hyperlipidemia Father    Sleep apnea Father     Prior to Admission medications   Medication Sig Start Date End Date Taking? Authorizing Provider  ALPRAZolam Prudy Feeler) 0.5 MG tablet Take 0.5 mg by mouth daily as needed. 12/06/23   [provider]  amiodarone (PACERONE) 200 MG tablet Take 0.5 tablets (100 mg total) by mouth daily. 06/10/23   Yates Decamp, MD  aspirin EC 81 MG tablet Take 1 tablet (81 mg total) by mouth daily. 04/23/20   Yates Decamp, MD  atorvastatin (LIPITOR) 80 MG tablet Take 1 tablet (80 mg total) by mouth daily. 06/10/23   Yates Decamp, MD  benazepril (LOTENSIN) 10 MG tablet Take 1 tablet (10 mg total) by mouth daily. 06/10/23   Yates Decamp, MD  Cholecalciferol (VITAMIN D3) 75 MCG (3000 UT) TABS Take 1 tablet by mouth daily.    [provider]  isosorbide mononitrate (IMDUR) 30 MG 24 hr tablet Take 0.5 tablets (15 mg total) by mouth daily. 07/05/23   Yates Decamp, MD  metoprolol tartrate (LOPRESSOR) 50 MG tablet Take 1 tablet (50 mg total) by mouth 2 (two) times  daily. 06/10/23   Yates Decamp, MD  ondansetron (ZOFRAN) 4 MG tablet Take 1 tablet (4 mg total) by mouth every 6 (six) hours as needed for nausea or vomiting. 11/02/23   Al Decant, PA-C  ondansetron (ZOFRAN-ODT) 4 MG disintegrating tablet Take 1 tablet (4 mg total) by mouth every 8 (eight) hours as needed for nausea or vomiting. 09/22/23   Arabella Merles, PA-C  oxyCODONE-acetaminophen (PERCOCET/ROXICET) 5-325 MG tablet Take 1 tablet by mouth every 6 (six) hours as needed for severe pain (pain score 7-10). 11/02/23   Al Decant, PA-C  predniSONE (STERAPRED UNI-PAK 21 TAB) 10 MG (21) TBPK tablet 10mg  Tabs, 6 day taper. Use as directed 10/15/23   Pollyann Savoy, MD  RABEprazole (ACIPHEX) 20 MG tablet Take 20 mg by mouth daily.  01/04/20  [provider]  Semaglutide,0.25 or 0.5MG /DOS, 2 MG/3ML SOPN Inject 0.25 mg into the skin once a week. 06/10/23   Yates Decamp, MD  tamsulosin (FLOMAX) 0.4 MG CAPS capsule Take 1 capsule (0.4 mg total) by mouth daily. 09/22/23   Arabella Merles, PA-C  traZODone (DESYREL) 100 MG tablet Take 1 tablet (100 mg total) by mouth at bedtime. 06/10/23   Yates Decamp, MD  XARELTO 20 MG TABS tablet Take 1 tablet (20 mg total) by mouth daily with supper. 06/10/23   Yates Decamp, MD    Physical Exam: Vitals:   12/08/23 1610 12/08/23 0905 12/08/23 0906 12/08/23 1030  BP:    (!) 108/95  Pulse:  86 81 (!) 56  Resp: (!) 30 (!) 27 (!) 28 15  Temp:      TempSrc:      SpO2:  95% 97% 99%  Weight:      Height:       General - AAOx3. No distrss.  Resp exam - b/l a/e vesicular Cvs-s1s2normal Abdomen - soft non tedner Extremity - warm no edmea. No focal deficit. Data Reviewed:  Labs on Admission:  Results for orders placed or performed during the hospital encounter of 12/08/23 (from the past 24 hours)  CBC with Differential     Status: None   Collection Time: 12/08/23  4:35 AM  Result Value Ref Range   WBC 9.2 4.0 - 10.5 K/uL   RBC 4.87 4.22 - 5.81  MIL/uL   Hemoglobin 13.8 13.0 - 17.0 g/dL   HCT 96.0 45.4 - 09.8 %   MCV 85.2 80.0 - 100.0 fL   MCH 28.3 26.0 - 34.0 pg   MCHC 33.3 30.0 - 36.0 g/dL   RDW 11.9 14.7 - 82.9 %   Platelets 238 150 - 400 K/uL   nRBC 0.0 0.0 - 0.2 %   Neutrophils Relative % 64 %   Neutro Abs 5.8 1.7 - 7.7 K/uL   Lymphocytes Relative 26 %   Lymphs Abs 2.4 0.7 - 4.0 K/uL   Monocytes Relative 7 %   Monocytes Absolute 0.6 0.1 - 1.0 K/uL   Eosinophils Relative 3 %   Eosinophils Absolute 0.3 0.0 - 0.5 K/uL   Basophils Relative 0 %   Basophils Absolute 0.0 0.0 - 0.1 K/uL   Immature Granulocytes 0 %   Abs Immature Granulocytes 0.03 0.00 - 0.07 K/uL  Basic metabolic panel     Status: Abnormal   Collection Time: 12/08/23  4:35 AM  Result Value Ref Range   Sodium 141 135 - 145 mmol/L   Potassium 3.9 3.5 - 5.1 mmol/L   Chloride 110 98 - 111 mmol/L   CO2 23 22 - 32 mmol/L   Glucose, Bld 122 (H) 70 - 99 mg/dL   BUN 12 6 - 20 mg/dL   Creatinine, Ser 5.62 0.61 - 1.24 mg/dL   Calcium 9.5 8.9 - 13.0 mg/dL   GFR, Estimated >86 >57 mL/min   Anion gap 8 5 - 15  Troponin I (High Sensitivity)     Status: None   Collection Time: 12/08/23  4:35 AM  Result Value Ref Range   Troponin I (High Sensitivity) 13 <18 ng/L  Magnesium     Status: None   Collection Time: 12/08/23  6:42 AM  Result Value Ref Range   Magnesium 2.1 1.7 - 2.4 mg/dL  Troponin I (High Sensitivity)     Status: Abnormal   Collection Time: 12/08/23  6:42 AM  Result Value Ref Range  Troponin I (High Sensitivity) 22 (H) <18 ng/L   Basic Metabolic Panel: Recent Labs  Lab 12/08/23 0435 12/08/23 0642  NA 141  --   K 3.9  --   CL 110  --   CO2 23  --   GLUCOSE 122*  --   BUN 12  --   CREATININE 0.80  --   CALCIUM 9.5  --   MG  --  2.1   Liver Function Tests: No results for input(s): "AST", "ALT", "ALKPHOS", "BILITOT", "PROT", "ALBUMIN" in the last 168 hours. No results for input(s): "LIPASE", "AMYLASE" in the last 168 hours. No results for  input(s): "AMMONIA" in the last 168 hours. CBC: Recent Labs  Lab 12/08/23 0435  WBC 9.2  NEUTROABS 5.8  HGB 13.8  HCT 41.5  MCV 85.2  PLT 238   Cardiac Enzymes: Recent Labs  Lab 12/08/23 0435 12/08/23 0642  TROPONINIHS 13 22*    BNP (last 3 results) No results for input(s): "PROBNP" in the last 8760 hours. CBG: No results for input(s): "GLUCAP" in the last 168 hours.  Radiological Exams on Admission:  DG Chest Portable 1 View Result Date: 12/08/2023 CLINICAL DATA:  48 year old male with palpitations. EXAM: PORTABLE CHEST 1 VIEW COMPARISON:  CTA Chest, Abdomen, and Pelvis 11/02/2023 and earlier. FINDINGS: Portable AP semi upright view at 0430 hours. Pacer or resuscitation pads project over the left chest. Improved lung volumes compared to 11/02/2023. Chronic sternotomy. Normal cardiac size and mediastinal contours. Visualized tracheal air column is within normal limits. Stable lung markings since last year. Allowing for portable technique the lungs are clear. No pneumothorax or pleural effusion. Stable visualized osseous structures. IMPRESSION: No acute cardiopulmonary abnormality. Electronically Signed   By: Odessa Fleming M.D.   On: 12/08/2023 04:52    chest X-ray  EKG: Independently reviewed. Afib with rate 102. Telemetry shows NSVT  No intake/output data recorded. No intake/output data recorded.      Assessment and Plan: * A-fib (HCC) Prior known/ chronic. Previously chose rhythm strategy.  However now seems to have relapse of atrial fibrillation with RVR and NSVT.  Patient strongly prefers rhythm control. Dr. Royann Shivers attemtped cardioversion X 3 in ER. Reverted to afib. Now, plan to give amiodarone and then cardiovert.   Cardiology re-evaluation pending.  Till then patient started on diltiazem infusion as well as amiodarone infusion.  Patient heart rate now resting between 100 and 110.  Patient asymptomatic.  I will check a thyroid panel for tomorrow. C/w anticoagulation with  xarelto  Troponin eleavtion due to RVR, will trend.  Med rec pending pharmacy input.   Advance Care Planning:   Code Status: Full Code   Consults: cardiology  Family Communication: per patient.  Severity of Illness: The appropriate patient status for this patient is INPATIENT. Inpatient status is judged to be reasonable and necessary in order to provide the required intensity of service to ensure the patient's safety. The patient's presenting symptoms, physical exam findings, and initial radiographic and laboratory data in the context of their chronic comorbidities is felt to place them at high risk for further clinical deterioration. Furthermore, it is not anticipated that the patient will be medically stable for discharge from the hospital within 2 midnights of admission.   * I certify that at the point of admission it is my clinical judgment that the patient will require inpatient hospital care spanning beyond 2 midnights from the point of admission due to high intensity of service, high risk for further deterioration and  high frequency of surveillance required.*  Author: Nolberto Hanlon, MD 12/08/2023 11:13 AM  For on call review [email or web address].

## 2023-12-08 NOTE — ED Notes (Signed)
 RN requested CPAP for pt so pt can sleep. RT called at this time.

## 2023-12-08 NOTE — ED Provider Notes (Signed)
 [place] EMERGENCY DEPARTMENT AT [facility] Provider Note   CSN: [id number] Arrival date & time: 12/08/23  8657     History  Chief Complaint  Patient presents with   Palpitations    Joseph Lang is a 48 y.o. male who presents emergency department with a chief complaint of A-fib and RVR. He has a  past medical history of paroxysmal atrial fibrillation and cardioversion on 01/17/2020 and 07/01/2020, hypertension, morbid obesity, obstructive sleep apnea on CPAP, tobacco use disorder still vaping, history of acute deep vein thrombosis and pulmonary embolism on 04/08/2015 following gastric sleeve resection and again presented with spontaneous right leg DVT on 03/07/2019 presently on long term Xarelto.  Due to multivessel coronary disease underwent CABG x 2 and LAA ligation on 06/27/2020.  Patient reports that around 2:30 in the morning he got up to urinate and felt his heart go into racing and palpitations.  He has a previous history of A-fib with RVR which required multiple cardioversions to break.  Patient has been on amiodarone, Xarelto for the past few years with his A-fib.  He is compliant with his medication.  He denies chest pain shortness of breath.  He has been under increased stress.   Palpitations      Home Medications Prior to Admission medications   Medication Sig Start Date End Date Taking? Authorizing Provider  amiodarone (PACERONE) 200 MG tablet Take 0.5 tablets (100 mg total) by mouth daily. 06/10/23   Yates Decamp, MD  aspirin EC 81 MG tablet Take 1 tablet (81 mg total) by mouth daily. 04/23/20   Yates Decamp, MD  atorvastatin (LIPITOR) 80 MG tablet Take 1 tablet (80 mg total) by mouth daily. 06/10/23   Yates Decamp, MD  benazepril (LOTENSIN) 10 MG tablet Take 1 tablet (10 mg total) by mouth daily. 06/10/23   Yates Decamp, MD  Cholecalciferol (VITAMIN D3) 75 MCG (3000 UT) TABS Take 1 tablet by mouth daily.    [provider]  isosorbide mononitrate (IMDUR) 30 MG 24  hr tablet Take 0.5 tablets (15 mg total) by mouth daily. 07/05/23   Yates Decamp, MD  metoprolol tartrate (LOPRESSOR) 50 MG tablet Take 1 tablet (50 mg total) by mouth 2 (two) times daily. 06/10/23   Yates Decamp, MD  ondansetron (ZOFRAN) 4 MG tablet Take 1 tablet (4 mg total) by mouth every 6 (six) hours as needed for nausea or vomiting. 11/02/23   Al Decant, PA-C  ondansetron (ZOFRAN-ODT) 4 MG disintegrating tablet Take 1 tablet (4 mg total) by mouth every 8 (eight) hours as needed for nausea or vomiting. 09/22/23   Arabella Merles, PA-C  oxyCODONE-acetaminophen (PERCOCET/ROXICET) 5-325 MG tablet Take 1 tablet by mouth every 6 (six) hours as needed for severe pain (pain score 7-10). 11/02/23   Al Decant, PA-C  predniSONE (STERAPRED UNI-PAK 21 TAB) 10 MG (21) TBPK tablet 10mg  Tabs, 6 day taper. Use as directed 10/15/23   Pollyann Savoy, MD  RABEprazole (ACIPHEX) 20 MG tablet Take 20 mg by mouth daily.  01/04/20   [provider]  Semaglutide,0.25 or 0.5MG /DOS, 2 MG/3ML SOPN Inject 0.25 mg into the skin once a week. 06/10/23   Yates Decamp, MD  tamsulosin (FLOMAX) 0.4 MG CAPS capsule Take 1 capsule (0.4 mg total) by mouth daily. 09/22/23   Arabella Merles, PA-C  traZODone (DESYREL) 100 MG tablet Take 1 tablet (100 mg total) by mouth at bedtime. 06/10/23   Yates Decamp, MD  XARELTO 20 MG TABS tablet Take  1 tablet (20 mg total) by mouth daily with supper. 06/10/23   Yates Decamp, MD      Allergies    Amoxicillin, Doxycycline hyclate, and Sulfamethoxazole-trimethoprim    Review of Systems   Review of Systems  Cardiovascular:  Positive for palpitations.    Physical Exam Updated Vital Signs BP (!) 146/100   Pulse (!) 134   Resp 16   Ht 6\' 2"  (1.88 m)   Wt (!) 190.5 kg   SpO2 98%   BMI 53.92 kg/m  Physical Exam Vitals and nursing note reviewed.  Constitutional:      General: He is not in acute distress.    Appearance: He is well-developed. He is not diaphoretic.   HENT:     Head: Normocephalic and atraumatic.  Eyes:     General: No scleral icterus.    Conjunctiva/sclera: Conjunctivae normal.  Cardiovascular:     Rate and Rhythm: Tachycardia present. Rhythm irregular.     Heart sounds: Normal heart sounds.  Pulmonary:     Effort: Pulmonary effort is normal. No respiratory distress.     Breath sounds: Normal breath sounds.  Abdominal:     Palpations: Abdomen is soft.     Tenderness: There is no abdominal tenderness.  Musculoskeletal:     Cervical back: Normal range of motion and neck supple.  Skin:    General: Skin is warm and dry.  Neurological:     Mental Status: He is alert.  Psychiatric:        Behavior: Behavior normal.     ED Results / Procedures / Treatments   Labs (all labs ordered are listed, but only abnormal results are displayed) Labs Reviewed  CBC WITH DIFFERENTIAL/PLATELET  BASIC METABOLIC PANEL  TROPONIN I (HIGH SENSITIVITY)    EKG None  Radiology No results found.  Procedures Procedures    Medications Ordered in ED Medications  diltiazem (CARDIZEM) 1 mg/mL load via infusion 10 mg (10 mg Intravenous Bolus from Bag 12/08/23 0437)    And  diltiazem (CARDIZEM) 125 mg in dextrose 5% 125 mL (1 mg/mL) infusion (5 mg/hr Intravenous New Bag/Given 12/08/23 0438)    ED Course/ Medical Decision Making/ A&P Clinical Course as of 12/08/23 2002  Sun Dec 08, 2023  0640 Case discussed with Dr. Havery Moros .  She will get EP involved with the patient's case.  She asked for hospitalist admission [AH]    Clinical Course User Index [AH] Arthor Captain, PA-C                                 Medical Decision Making Amount and/or Complexity of Data Reviewed Labs: ordered. Radiology: ordered.  Risk Prescription drug management. Decision regarding hospitalization.   This patient presents to the ED with chief complaint(s) of A-fib with RVR with pertinent past medical history of CAD status post CABG, A-fib with RVR,  paroxysmal, obesity which further complicates the presenting complaint. The complaint involves an extensive differential diagnosis and treatment options and also carries with it a high risk of complications and morbidity.    The differential diagnosis includes The differential diagnosis for palpitations includes cardiac arrhythmias, PVC/PAC, ACS, Cardiomyopathy, CHF, MVP, pericarditis, valvular disease, Panic/Anxiety, Somatic disorder, ETOH, Caffeine,  Stimulant use, medication side effect, Anemia, Hyperthyroidism, pulmonary embolism.    The initial plan is to order cardiac monitoring, labs, EKG and to treat patient with diltiazem for rate control  Additional Tests and treatment considered:  Patient is low risk / negative by pulmonary embolus as he is compliant with use of his Xarelto, therefore do not feel that PE workup is indicated.  Additional history obtained: Additional history obtained from review of EMR Records reviewed previous admission documents and outpatient cardiology notes  Reassessment and review (also see workup area): Lab Tests: I Ordered, and personally interpreted labs.  The pertinent results include:   BMP with glucose of 122 CBC within normal limits Remainder of patient's labs are pending    Imaging Studies: I ordered and independently visualized and interpreted the following imaging X-ray chest   which showed no acute finding The interpretation of the imaging was limited to assessing for emergent pathology, for which purpose it was ordered.  Consultations Obtained: I requested consultation with the consultant cardiologist as per ED course , and discussed  findings as well as pertinent plan - they recommend: Admission, EP involvement  Medicines ordered and prescription drug management: I ordered the following medications diltiazem for rate control I considered this additional medications: Amiodarone, labetalol   Reevaluation of the patient after these medicines  showed that the patient    stayed the same  Social Determinants of Health: Patient's  SDOH Screenings   Depression (PHQ2-9): Low Risk  (12/28/2020)  Social Connections: Unknown (02/23/2022)   Received from [facility]  Tobacco Use: Medium Risk (12/08/2023)    increases the complexity of managing their presentation  Cardiac Monitoring: The patient was maintained on a cardiac monitor.  I personally viewed and interpreted the cardiac monitor which showed an underlying rhythm of:  Atrial Fibrillation with rapid ventricular response  Complexity of problems addressed: Patient's presentation is most consistent with with refractory atrial fibrillation with rapid ventricular response,  During patient's assessment  Disposition: After consideration of the diagnostic results and the patient's response to treatment,  I feel that the patent would benefit from admission   .    Signout given to PA Mellody Dance at shift change for admission         Final Clinical Impression(s) / ED Diagnoses Final diagnoses:  None    Rx / DC Orders ED Discharge Orders     None         Delos Haring 12/08/23 2008    Palumbo, April, MD 12/10/23 2316

## 2023-12-08 NOTE — ED Triage Notes (Addendum)
 Patient reports feeling palpitations at 2:30am while laying in bed. EMS found patient in afib RVR with rate 170-198. 45mg  Cardizem and NS given PTA. Hx of afib. Denies chest pain, shortness of breath, or nausea. Compliant with medications.   BP 150/72 HR 80-150

## 2023-12-08 NOTE — Assessment & Plan Note (Addendum)
 Prior known/ chronic. Previously chose rhythm strategy.  However now seems to have relapse of atrial fibrillation with RVR and NSVT.  Patient strongly prefers rhythm control. Dr. Royann Shivers attemtped cardioversion X 3 in ER. Reverted to afib. Now, plan to give amiodarone and then cardiovert.   Cardiology re-evaluation pending.  Till then patient started on diltiazem infusion as well as amiodarone infusion.  Patient heart rate now resting between 100 and 110.  Patient asymptomatic.  I will check a thyroid panel for tomorrow. C/w anticoagulation with xarelto

## 2023-12-08 NOTE — ED Provider Notes (Signed)
 Patient signed out to myself by Arthor Captain, PA-C at shift change pending hospital last admission for A-fib with RVR.  Please refer to her note for full HPI, PE, ROS, and MDM.  Patient with history of A-fib presents today after feeling her palpitations that began around 230 this morning.  Patient was found to be in A-fib with RVR.  Patient denies chest pain or shortness of breath, but is tachycardic tachypneic on exam.  Patient is not a good candidate for sedation and cardioversion in the ED due to body habitus.  Patient is anticoagulated at baseline with Xarelto.  Physical Exam  BP (!) 136/91   Pulse (!) 140   Temp 97.6 F (36.4 C) (Temporal)   Resp (!) 23   Ht 6\' 2"  (1.88 m)   Wt (!) 190.5 kg   SpO2 96%   BMI 53.92 kg/m   Physical Exam Constitutional:      General: He is not in acute distress.    Appearance: He is obese. He is not ill-appearing, toxic-appearing or diaphoretic.  HENT:     Head: Normocephalic and atraumatic.     Right Ear: External ear normal.     Left Ear: External ear normal.     Nose: Nose normal.  Eyes:     Extraocular Movements: Extraocular movements intact.  Cardiovascular:     Rate and Rhythm: Tachycardia present. Rhythm irregular.     Pulses: Normal pulses.  Pulmonary:     Effort: Pulmonary effort is normal.     Breath sounds: Normal breath sounds.     Comments: Patient on CPAP Musculoskeletal:     Cervical back: Normal range of motion.  Skin:    General: Skin is warm and dry.     Capillary Refill: Capillary refill takes less than 2 seconds.  Neurological:     General: No focal deficit present.     Mental Status: He is alert.     Procedures  Procedures  ED Course / MDM   Clinical Course as of 12/08/23 0756  Sun Dec 08, 2023  1610 Case discussed with Dr. Havery Moros .  She will get EP involved with the patient's case.  She asked for hospitalist admission [AH]    Clinical Course User Index [AH] Arthor Captain, PA-C   Medical Decision  Making Amount and/or Complexity of Data Reviewed Labs: ordered. Radiology: ordered.  Risk Prescription drug management.   Consult: Hospitalist, Dr. Maryjean Ka who is agreeable to admission with cardiology consultation for A-fib with RVR.       Dolphus Jenny, PA-C 12/08/23 9604    Tegeler, Canary Brim, MD 12/08/23 [phone]

## 2023-12-08 NOTE — Transfer of Care (Signed)
 Immediate Anesthesia Transfer of Care Note  Patient: Joseph Lang  Procedure(s) Performed: AN AD HOC OOD CARDIOVERSION  Patient Location:    ED  Anesthesia Type:General  Level of Consciousness: awake, alert , oriented, and patient cooperative  Airway & Oxygen Therapy: Patient Spontanous Breathing and Patient connected to nasal cannula oxygen  Post-op Assessment: Report given to RN and Post -op Vital signs reviewed and stable  Post vital signs: Reviewed and stable  Last Vitals:  Vitals Value Taken Time  BP 127/64 12/08/23 0903  Temp    Pulse 81 12/08/23 0906  Resp 28 12/08/23 0906  SpO2 97 % 12/08/23 0906    Last Pain:  Vitals:   12/08/23 0546  TempSrc: Temporal  PainSc:          Complications: No notable events documented.

## 2023-12-08 NOTE — Progress Notes (Signed)
 Patient converted to NSR after arrival to room from ED. EKG obtained and confirmed. MD paged. Callie called back and aware of changes in patient's cardiac rhythm.

## 2023-12-08 NOTE — ED Notes (Signed)
 Ear-Plugs order through [facility]

## 2023-12-08 NOTE — Anesthesia Postprocedure Evaluation (Signed)
 Anesthesia Post Note  Patient: [name]  Procedure(s) Performed: AN AD HOC OOD CARDIOVERSION     Patient location during evaluation: Other Anesthesia Type: General Level of consciousness: awake and alert, patient cooperative and oriented Pain management: pain level controlled Vital Signs Assessment: post-procedure vital signs reviewed and stable Respiratory status: nonlabored ventilation, respiratory function stable, patient connected to nasal cannula oxygen and spontaneous breathing Cardiovascular status: tachycardic and stable (remains in Afib) Postop Assessment: no apparent nausea or vomiting Anesthetic complications: no   No notable events documented.  Last Vitals:  Vitals:   12/08/23 0905 12/08/23 0906  BP:    Pulse: 86 81  Resp: (!) 27 (!) 28  Temp:    SpO2: 95% 97%    Last Pain:  Vitals:   12/08/23 0546  TempSrc: Temporal  PainSc:                  Taye Cato,E. Demarques Pilz

## 2023-12-08 NOTE — Anesthesia Preprocedure Evaluation (Signed)
 Anesthesia Evaluation  Patient identified by MRN, date of birth, ID band Patient awake    Reviewed: Allergy & Precautions, NPO status , Patient's Chart, lab work & pertinent test results  History of Anesthesia Complications Negative for: history of anesthetic complications  Airway Mallampati: I  TM Distance: >3 FB Neck ROM: Full    Dental  (+) Dental Advisory Given   Pulmonary sleep apnea and Continuous Positive Airway Pressure Ventilation , former smoker   breath sounds clear to auscultation       Cardiovascular hypertension, Pt. on medications and Pt. on home beta blockers + CAD and + CABG  + dysrhythmias Atrial Fibrillation  Rhythm:Regular Rate:Tachycardia  '21 ECHO: EF 45 to 50%.  1. The LV has mildly decreased function,  global hypokinesis.   2. RVF is normal. The right ventricular size is normal.   3. LAA clipp with reverberation artifact noted. Very small residual LAA  noted without thrombus with normal function. . No left atrial/left atrial  appendage thrombus was detected.   4. The mitral valve is normal in structure. Trivial mitral valve regurgitation.   5. The aortic valve is normal in structure. Aortic valve regurgitation is not visualized. No aortic stenosis is present.     Neuro/Psych negative neurological ROS     GI/Hepatic Neg liver ROS,GERD  Medicated,,  Endo/Other    Class 4 obesityBMI 54  Renal/GU negative Renal ROS     Musculoskeletal   Abdominal  (+) + obese  Peds  Hematology xarelto   Anesthesia Other Findings   Reproductive/Obstetrics                              Anesthesia Physical Anesthesia Plan  ASA: 4  Anesthesia Plan: General   Post-op Pain Management: Minimal or no pain anticipated   Induction: Intravenous  PONV Risk Score and Plan: 2 and Treatment may vary due to age or medical condition  Airway Management Planned: Natural Airway and  Mask  Additional Equipment: None  Intra-op Plan:   Post-operative Plan:   Informed Consent: I have reviewed the patients History and Physical, chart, labs and discussed the procedure including the risks, benefits and alternatives for the proposed anesthesia with the patient or authorized representative who has indicated his/her understanding and acceptance.     Dental advisory given  Plan Discussed with: CRNA and Surgeon  Anesthesia Plan Comments:          Anesthesia Quick Evaluation

## 2023-12-08 NOTE — CV Procedure (Signed)
 Procedure: Electrical Cardioversion Indications:  Atrial Flutter  Procedure Details:  Consent: Risks of procedure as well as the alternatives and risks of each were explained to the (patient/caregiver).  Consent for procedure obtained.  Time Out: Verified patient identification, verified procedure, site/side was marked, verified correct patient position, special equipment/implants available, medications/allergies/relevent history reviewed, required imaging and test results available.  Performed  Patient placed on cardiac monitor, pulse oximetry, supplemental oxygen as necessary.  Sedation given:  Intravenous propofol, Dr. Jean Rosenthal Pacer pads placed anterior and posterior chest.  Cardioverted 3 time(s).  Cardioversion with synchronized biphasic 200J shock.  Each time the patient's rhythm converted to sinus bradycardia at about 55-60 bpm but within 2 minutes the rhythm converted to atrial fibrillation.  After the first 2 attempts the atrial fibrillation gradually reorganized into a pattern of atrial flutter similar to the presentation rhythm.  After the third shock, the rhythm converted to and remained atrial fibrillation with rapid ventricular response.  Will load with intravenous amiodarone and try again in 24 hours.  Evaluation: Findings: Post procedure EKG shows: Atrial Fibrillation Complications: None Patient did tolerate procedure well.  Time Spent Directly with the Patient:  30 minutes   Breonna Lang 12/08/2023, 9:22 AM

## 2023-12-08 NOTE — Consult Note (Addendum)
 Cardiology Consultation   Patient ID: Joseph E Amico MRN: [id number]; DOB: 1976/09/28  Admit date: 12/08/2023 Date of Consult: 12/08/2023  PCP:  Georgann Housekeeper, MD   [facility] Providers Cardiologist:  None        Patient Profile:   Joseph Lang is a 48 y.o. male with a hx of  Coronary artery disease S/p CABG, LAA ligation 06/2020 TTE 02/29/20: EF 55-60  TEE 07/01/20 (pt in AFib): EF 45-50, global HK, NL RVSF, trivial MR Persistent atrial fibrillation  S/p multiple DCCVs in the past S/p LAA ligation at time of CABG in 2021 Amiodarone Rx  Carotid US 05/05/20: Bilat ICA 1-39  Hypertension  Hyperlipidemia  Obesity  S/p gastric sleeve OSA on CPAP Tobacco use - currently vapes Hx of recurrent DVT, pulmonary embolism 03/2015 Lifelong anticoagulation  Hx of pancreatitis 10/2023  who is being seen 12/08/2023 for the evaluation of atrial fibrillation with rapid ventricular rate at the request of Dr. Maryjean Ka.  History of Present Illness:   Joseph Lang was last seen by Dr. Jacinto Halim in 05/2023.   He presented to the ED today with rapid palpitations that started around 2:30 am. He was awake (usually falls asleep around 3 am), got up to go to the BR to urinate and noted his HR was fast. He has not had chest pain, shortness of breath, syncope, near syncope, orthopnea. He has chronic non-pitting edema. He notes he has not felt good since his bypass. He is under a lot of stress. Admits to having issues with anger recently. He is concerned about his mother who is in her 101s. She lives with him and he takes care of her. She is currently at home by herself. He is eager to get back home to her. Pt has required heavy sedation in the past for DCCV and was therefore not felt to be a candidate for DCCV in the ED. Cardiology is asked to evaluate. I have reached out to anesthesia who will see him later this morning to help with DCCV. He has not eaten since 12:45 am today.   Data K 3.9, SCr 0.80, Hgb  13.8,  hsT 13>>22 CXR normal EKG: AF, HR 146, normal axis, 5 beats NSVT vs aberrancy, non-specific ST-TW changes    Past Medical History:  Diagnosis Date   Coronary artery disease 2021   Dysrhythmia 2021   Afib   GERD (gastroesophageal reflux disease)    History of blood clots    Hx of deep vein thrombophlebitis of lower extremity    Hx of gastric bypass    sleeve   Hx of pulmonary embolus    Hypertension    Obesity    Other hyperlipidemia    PONV (postoperative nausea and vomiting)    Throwing up after wt loss surgery   S/P CABG x 2 06/28/2020: LIMA to LAD, Free Radial graft to D1. LAA ligation with 40 mm Atricure. 06/27/2020   Sleep apnea    Swelling of both lower extremities    Tobacco abuse     Past Surgical History:  Procedure Laterality Date   CARDIAC CATHETERIZATION Left 2021   CARDIOVERSION     CARDIOVERSION N/A 04/19/2020   Procedure: CARDIOVERSION;  Surgeon: Yates Decamp, MD;  Location: [facility] ENDOSCOPY;  Service: Cardiovascular;  Laterality: N/A;   CARDIOVERSION N/A 04/26/2020   Procedure: CARDIOVERSION;  Surgeon: Yates Decamp, MD;  Location: [facility] ENDOSCOPY;  Service: Cardiovascular;  Laterality: N/A;   CARDIOVERSION N/A 07/01/2020   Procedure: CARDIOVERSION;  Surgeon: Yates Decamp, MD;  Location: [facility] ENDOSCOPY;  Service: Cardiovascular;  Laterality: N/A;   CLIPPING OF ATRIAL APPENDAGE N/A 06/27/2020   Procedure: CLIPPING OF ATRIAL APPENDAGE USING ATRICURE CLIP SIZE 40;  Surgeon: Delight Ovens, MD;  Location: [facility] OR;  Service: Open Heart Surgery;  Laterality: N/A;   CORONARY ARTERY BYPASS GRAFT N/A 06/27/2020   Procedure: CORONARY ARTERY BYPASS GRAFTING (CABG) X TWO, USING LEFT INTERNAL MAMMARY ARTERY AND LEFT ARM RADIAL ARTERY;  Surgeon: Delight Ovens, MD;  Location: [facility] OR;  Service: Open Heart Surgery;  Laterality: N/A;   FRACTURE SURGERY  1995   pelvis   HIP SURGERY     LAPAROSCOPIC GASTRIC SLEEVE RESECTION N/A 02/14/2015   Procedure: LAPAROSCOPIC GASTRIC SLEEVE  RESECTION WITH UPPER ENDOSCOPY;  Surgeon: Luretha Murphy, MD;  Location: [facility] ORS;  Service: General;  Laterality: N/A;   LEFT HEART CATH AND CORONARY ANGIOGRAPHY N/A 04/19/2020   Procedure: LEFT HEART CATH AND CORONARY ANGIOGRAPHY;  Surgeon: Yates Decamp, MD;  Location: [facility] INVASIVE CV LAB;  Service: Cardiovascular;  Laterality: N/A;   PELVIS CLOSED REDUCTION     RADIAL ARTERY HARVEST Left 06/27/2020   Procedure: LEFT RADIAL ARTERY HARVEST;  Surgeon: Delight Ovens, MD;  Location: [facility] OR;  Service: Open Heart Surgery;  Laterality: Left;   TEE WITHOUT CARDIOVERSION N/A 06/27/2020   Procedure: TRANSESOPHAGEAL ECHOCARDIOGRAM (TEE);  Surgeon: Delight Ovens, MD;  Location: [facility] OR;  Service: Open Heart Surgery;  Laterality: N/A;   TEE WITHOUT CARDIOVERSION N/A 07/01/2020   Procedure: TRANSESOPHAGEAL ECHOCARDIOGRAM (TEE);  Surgeon: Yates Decamp, MD;  Location: [facility] ENDOSCOPY;  Service: Cardiovascular;  Laterality: N/A;     Home Medications:  Prior to Admission medications   Medication Sig Start Date End Date Taking? Authorizing Provider  amiodarone (PACERONE) 200 MG tablet Take 0.5 tablets (100 mg total) by mouth daily. 06/10/23   Yates Decamp, MD  aspirin EC 81 MG tablet Take 1 tablet (81 mg total) by mouth daily. 04/23/20   Yates Decamp, MD  atorvastatin (LIPITOR) 80 MG tablet Take 1 tablet (80 mg total) by mouth daily. 06/10/23   Yates Decamp, MD  benazepril (LOTENSIN) 10 MG tablet Take 1 tablet (10 mg total) by mouth daily. 06/10/23   Yates Decamp, MD  Cholecalciferol (VITAMIN D3) 75 MCG (3000 UT) TABS Take 1 tablet by mouth daily.    [provider]  isosorbide mononitrate (IMDUR) 30 MG 24 hr tablet Take 0.5 tablets (15 mg total) by mouth daily. 07/05/23   Yates Decamp, MD  metoprolol tartrate (LOPRESSOR) 50 MG tablet Take 1 tablet (50 mg total) by mouth 2 (two) times daily. 06/10/23   Yates Decamp, MD  ondansetron (ZOFRAN) 4 MG tablet Take 1 tablet (4 mg total) by mouth every 6 (six) hours as needed for nausea  or vomiting. 11/02/23   Al Decant, PA-C  ondansetron (ZOFRAN-ODT) 4 MG disintegrating tablet Take 1 tablet (4 mg total) by mouth every 8 (eight) hours as needed for nausea or vomiting. 09/22/23   Arabella Merles, PA-C  oxyCODONE-acetaminophen (PERCOCET/ROXICET) 5-325 MG tablet Take 1 tablet by mouth every 6 (six) hours as needed for severe pain (pain score 7-10). 11/02/23   Al Decant, PA-C  predniSONE (STERAPRED UNI-PAK 21 TAB) 10 MG (21) TBPK tablet 10mg  Tabs, 6 day taper. Use as directed 10/15/23   Pollyann Savoy, MD  RABEprazole (ACIPHEX) 20 MG tablet Take 20 mg by mouth daily.  01/04/20   [provider]  Semaglutide,0.25 or 0.5MG /DOS, 2  MG/3ML SOPN Inject 0.25 mg into the skin once a week. 06/10/23   Yates Decamp, MD  tamsulosin (FLOMAX) 0.4 MG CAPS capsule Take 1 capsule (0.4 mg total) by mouth daily. 09/22/23   Arabella Merles, PA-C  traZODone (DESYREL) 100 MG tablet Take 1 tablet (100 mg total) by mouth at bedtime. 06/10/23   Yates Decamp, MD  XARELTO 20 MG TABS tablet Take 1 tablet (20 mg total) by mouth daily with supper. 06/10/23   Yates Decamp, MD    Inpatient Medications: Scheduled Meds:  rivaroxaban  20 mg Oral Q supper   sodium chloride flush  3 mL Intravenous Q12H   Continuous Infusions:  diltiazem (CARDIZEM) infusion 15 mg/hr (12/08/23 0608)   PRN Meds: acetaminophen **OR** acetaminophen, polyethylene glycol  Allergies:    Allergies  Allergen Reactions   Amoxicillin Nausea And Vomiting   Doxycycline Hyclate Rash   Sulfamethoxazole-Trimethoprim Hives, Rash and Other (See Comments)    White oral lesions    Social History:   Social History   Socioeconomic History   Marital status: Single    Spouse name: Not on file   Number of children: 0   Years of education: 12   Highest education level: High school graduate  Occupational History   Occupation: Medical laboratory scientific officer  Tobacco Use   Smoking status: Former    Current  packs/day: 0.00    Average packs/day: 0.5 packs/day for 30.0 years (15.0 ttl pk-yrs)    Types: Cigarettes    Start date: 04/20/1990    Quit date: 04/20/2020    Years since quitting: 3.6   Smokeless tobacco: Never   Tobacco comments:    half a pack daily  Vaping Use   Vaping status: Every Day   Substances: Nicotine  Substance and Sexual Activity   Alcohol use: Not Currently   Drug use: No   Sexual activity: Not Currently  Other Topics Concern   Not on file  Social History Narrative   ** Merged History Encounter **    Pt lives alone    Pt doesn't work    Social Drivers of Corporate investment banker Strain: Not on file  Food Insecurity: Not on file  Transportation Needs: Not on file  Physical Activity: Not on file  Stress: Not on file  Social Connections: Unknown (02/23/2022)   Received from [facility]   Social Network    Social Network: Not on file  Intimate Partner Violence: Unknown (01/16/2022)   Received from [employer], [facility]   HITS    Physically Hurt: Not on file    Insult or Talk Down To: Not on file    Threaten Physical Harm: Not on file    Scream or Curse: Not on file    Family History:   Family History  Problem Relation Age of Onset   Atrial fibrillation Mother    CAD Mother    Heart disease Mother    Hypertension Mother    Hyperlipidemia Mother    Sleep apnea Mother    CAD Father    Atrial fibrillation Father    Diabetes Father    Hypertension Father    Hyperlipidemia Father    Sleep apnea Father      ROS:  Please see the history of present illness.  He notes recent dysuria.  He has not had abdominal pain, melena, hematochezia, hematuria, fevers, cough. All other ROS reviewed and negative.     Physical Exam/Data:   Vitals:  12/08/23 0615 12/08/23 0630 12/08/23 0710 12/08/23 0728  BP: (!) 134/94 (!) 130/97 (!) 136/91   Pulse: (!) 130 (!) 130 (!) 128 (!) 140  Resp: (!) 25 (!) 24 18 (!) 23  Temp:      TempSrc:       SpO2: 92% 96% 98% 96%  Weight:      Height:       No intake or output data in the 24 hours ending 12/08/23 0842    12/08/2023    4:04 AM 10/14/2023    6:52 PM 09/26/2023    3:36 PM  Last 3 Weights  Weight (lbs) 419 lb 15.6 oz 420 lb 422 lb  Weight (kg) 190.5 kg 190.511 kg 191.418 kg     Body mass index is 53.92 kg/m.  General:  Well nourished, well developed, in no acute distress  HEENT: normal Neck: I cannot appreciate JVD Cardiac:  rapid irregularly, irregular rhythm Lungs:  clear to auscultation bilaterally, no wheezing, rhonchi or rales  Abd: soft, nontender  Ext: no pitting edema Musculoskeletal:  No deformities  Skin: warm and dry  Neuro:  CNs 2-12 intact, no focal abnormalities noted Psych:  Normal affect   EKG:  The EKG was personally reviewed and demonstrates:  see HPI Telemetry:  Telemetry was personally reviewed and demonstrates:  AFib, HR 120-130s  Relevant CV Studies:   Laboratory Data:  High Sensitivity Troponin:   Recent Labs  Lab 12/08/23 0435 12/08/23 0642  TROPONINIHS 13 22*     Chemistry Recent Labs  Lab 12/08/23 0435 12/08/23 0642  NA 141  --   K 3.9  --   CL 110  --   CO2 23  --   GLUCOSE 122*  --   BUN 12  --   CREATININE 0.80  --   CALCIUM 9.5  --   MG  --  2.1  GFRNONAA >60  --   ANIONGAP 8  --     Hematology Recent Labs  Lab 12/08/23 0435  WBC 9.2  RBC 4.87  HGB 13.8  HCT 41.5  MCV 85.2  MCH 28.3  MCHC 33.3  RDW 14.3  PLT 238   Radiology/Studies:  DG Chest Portable 1 View Result Date: 12/08/2023 CLINICAL DATA:  48 year old male with palpitations. EXAM: PORTABLE CHEST 1 VIEW COMPARISON:  CTA Chest, Abdomen, and Pelvis 11/02/2023 and earlier. FINDINGS: Portable AP semi upright view at 0430 hours. Pacer or resuscitation pads project over the left chest. Improved lung volumes compared to 11/02/2023. Chronic sternotomy. Normal cardiac size and mediastinal contours. Visualized tracheal air column is within normal limits.  Stable lung markings since last year. Allowing for portable technique the lungs are clear. No pneumothorax or pleural effusion. Stable visualized osseous structures. IMPRESSION: No acute cardiopulmonary abnormality. Electronically Signed   By: Odessa Fleming M.D.   On: 12/08/2023 04:52   Assessment and Plan:   1. Atrial fibrillation with rapid ventricular response He is s/p multiple DCCVs in 2021, requiring heavy sedation. He has not had recurrent atrial fibrillation since that time. He is s/p LAA ligation at the time of his CABG. He is also on long term anticoagulation due to hx of recurrent DVT. He has been maintained on Xarelto and Amiodarone. He has not missed a dose of Xarelto. He is currently stable with minimal symptoms and good blood pressure. He seems like a good candidate for DCCV and discharge to home. However, he is not able to undergo DCCV in the ED per their protocol  due to prior hx of heavy sedation. I have spoken with anesthesia who will see him this morning to help with arranging DCCV.  -Continue Diltiazem drip 15 mg/hr -Continue Xarelto 20 mg once daily -Continue Amiodarone 100 mg once daily  -Tentative DCCV later this AM -NPO for now -Consider referral to EP for consideration of PVI ablation  2. Coronary artery disease  S/p CABG in 2021. His 2nd troponin is minimally elevated without significant delta. No significant ischemic changes on EKG. Troponin abnormality is most likely due to demand ischemia. He has not had chest pain to suggest angina.  -Continue ASA 81 mg once daily -Continue Atorvastatin 80 mg once daily   3. Recurrent DVT Continue Xarelto 20 mg once daily   Risk Assessment/Risk Scores:        CHA2DS2-VASc Score = 2   This indicates a 2.2% annual risk of stroke. The patient's score is based upon: CHF History: 0 HTN History: 1 Diabetes History: 0 Stroke History: 0 Vascular Disease History: 1 Age Score: 0 Gender Score: 0        For questions or updates,  please contact [facility] Please consult [email or web address] for contact info under   Signed, Tereso Newcomer, PA-C  12/08/2023 8:42 AM   I have seen and examined the patient along with Tereso Newcomer, PA-C .  I have reviewed the chart, notes and new data.  I agree with PA/NP's note.  Key new complaints: He had sudden onset of rapid palpitations at 2:30 AM when he got up to use the restroom.  He was awake at the time.  He wonders whether he may have some symptoms of a urinary tract infection but he has had no respiratory infection complaints.  He reports 100% compliance with CPAP.  He has not had any alcoholic beverages in the last year.  He is trying to lose weight, has had a previous gastric sleeve surgery without much success.  He is on a very low-dose of amiodarone and has not required cardioversion since 2021.  He reports compliance with anticoagulation.  He has had a left atrial appendage ligation, but is also on anticoagulants due to a remote history of DVT/PE. His mother has limited mobility due to hip problems and he is very worried about leaving her alone at home.  I called his mother Joseph Lang and she thinks she can handle 24 hours without his assistance. Key examination changes: Morbidly obese.  He is in rapid regular rhythm consistent with atrial flutter with 2: 1 AV block.  Clear lungs.  No evidence of heart failure. Key new findings / data: ECG consistent with atrial flutter with 2: 1 AV block.  Electrolytes are normal.  PLAN: Will ask anesthesiology to help with direct-current cardioversion and hopefully get him home today.  Also plan to "reload" with higher dose of amiodarone and then increase the routine amiodarone to a full 200 mg daily.  Informed Consent   Shared Decision Making/Informed Consent The risks (stroke, cardiac arrhythmias rarely resulting in the need for a temporary or permanent pacemaker, skin irritation or burns and complications associated with conscious sedation  including aspiration, arrhythmia, respiratory failure and death), benefits (restoration of normal sinus rhythm) and alternatives of a direct current cardioversion were explained in detail to Joseph Lang and he agrees to proceed.        Thurmon Fair, MD, [facility] [phone] 12/08/2023, 9:16 AM

## 2023-12-09 ENCOUNTER — Other Ambulatory Visit: Payer: Self-pay | Admitting: Physician Assistant

## 2023-12-09 DIAGNOSIS — I4891 Unspecified atrial fibrillation: Secondary | ICD-10-CM | POA: Diagnosis not present

## 2023-12-09 DIAGNOSIS — I480 Paroxysmal atrial fibrillation: Secondary | ICD-10-CM

## 2023-12-09 LAB — APTT: aPTT: 39 s — ABNORMAL HIGH (ref 24–36)

## 2023-12-09 LAB — PROTIME-INR
INR: 1.4 — ABNORMAL HIGH (ref 0.8–1.2)
Prothrombin Time: 17.8 s — ABNORMAL HIGH (ref 11.4–15.2)

## 2023-12-09 LAB — BASIC METABOLIC PANEL
Anion gap: 8 (ref 5–15)
BUN: 10 mg/dL (ref 6–20)
CO2: 24 mmol/L (ref 22–32)
Calcium: 8.9 mg/dL (ref 8.9–10.3)
Chloride: 107 mmol/L (ref 98–111)
Creatinine, Ser: 0.73 mg/dL (ref 0.61–1.24)
GFR, Estimated: >60 mL/min (ref >60)
Glucose, Bld: 106 mg/dL — ABNORMAL HIGH (ref 70–99)
Potassium: 3.8 mmol/L (ref 3.5–5.1)
Sodium: 139 mmol/L (ref 135–145)

## 2023-12-09 LAB — T4, FREE: Free T4: 0.71 ng/dL (ref 0.61–1.12)

## 2023-12-09 LAB — TSH: TSH: 1.518 u[IU]/mL (ref 0.350–4.500)

## 2023-12-09 MED ORDER — AMIODARONE HCL 200 MG PO TABS: 400.0000 mg | ORAL_TABLET | Freq: Two times a day (BID) | ORAL | 1 refills | Status: AC

## 2023-12-09 NOTE — Progress Notes (Signed)
 RN went over discharge instructions and medication changes with the patient on the bedside. RN made patient aware of where to pick up the medication, patient verbalize understanding. NT removed PIV and removed tele monitor. Patient transportation at the bedside. Patient currently getting dressed.

## 2023-12-09 NOTE — Progress Notes (Signed)
 Progress Note  Patient Name: Joseph Lang Date of Encounter: 12/09/2023  Primary Cardiologist:   Yates Decamp, MD   Subjective   No pain.  Ready to go home.    Inpatient Medications    Scheduled Meds:  amiodarone  150 mg Intravenous Once   rivaroxaban  20 mg Oral Q supper   sodium chloride flush  3 mL Intravenous Q12H   Continuous Infusions:  amiodarone 30 mg/hr (12/08/23 2259)   PRN Meds: acetaminophen **OR** acetaminophen, ALPRAZolam, ondansetron (ZOFRAN) IV, polyethylene glycol   Vital Signs    Vitals:   12/09/23 0517 12/09/23 0518 12/09/23 0519 12/09/23 0730  BP: (!) 144/58   135/68  Pulse: 68 63 61 (!) 53  Resp: 18   18  Temp: (!) 97.1 F (36.2 C)   98.4 F (36.9 C)  TempSrc: Oral   Oral  SpO2: 93% 98% 98% 97%  Weight: (!) 188 kg     Height:        Intake/Output Summary (Last 24 hours) at 12/09/2023 0818 Last data filed at 12/08/2023 1600 Gross per 24 hour  Intake 669.36 ml  Output --  Net 669.36 ml   Filed Weights   12/08/23 0404 12/08/23 1300 12/09/23 0517  Weight: (!) 190.5 kg (!) 187.1 kg (!) 188 kg    Telemetry    NSR - Personally Reviewed  ECG    NA - Personally Reviewed  Physical Exam   GEN: No acute distress.   Neck: No  JVD Cardiac: RRR, no murmurs, rubs, or gallops.  Respiratory: Clear  to auscultation bilaterally. GI: Soft, nontender, non-distended  MS: No  edema; No deformity. Neuro:  Nonfocal  Psych: Normal affect   Labs    Chemistry Recent Labs  Lab 12/08/23 0435 12/09/23 0256  NA 141 139  K 3.9 3.8  CL 110 107  CO2 23 24  GLUCOSE 122* 106*  BUN 12 10  CREATININE 0.80 0.73  CALCIUM 9.5 8.9  GFRNONAA >60 >60  ANIONGAP 8 8     Hematology Recent Labs  Lab 12/08/23 0435  WBC 9.2  RBC 4.87  HGB 13.8  HCT 41.5  MCV 85.2  MCH 28.3  MCHC 33.3  RDW 14.3  PLT 238    Cardiac EnzymesNo results for input(s): "TROPONINI" in the last 168 hours. No results for input(s): "TROPIPOC" in the last 168 hours.    BNPNo results for input(s): "BNP", "PROBNP" in the last 168 hours.   DDimer No results for input(s): "DDIMER" in the last 168 hours.   Radiology    DG Chest Portable 1 View Result Date: 12/08/2023 CLINICAL DATA:  48 year old male with palpitations. EXAM: PORTABLE CHEST 1 VIEW COMPARISON:  CTA Chest, Abdomen, and Pelvis 11/02/2023 and earlier. FINDINGS: Portable AP semi upright view at 0430 hours. Pacer or resuscitation pads project over the left chest. Improved lung volumes compared to 11/02/2023. Chronic sternotomy. Normal cardiac size and mediastinal contours. Visualized tracheal air column is within normal limits. Stable lung markings since last year. Allowing for portable technique the lungs are clear. No pneumothorax or pleural effusion. Stable visualized osseous structures. IMPRESSION: No acute cardiopulmonary abnormality. Electronically Signed   By: Odessa Fleming M.D.   On: 12/08/2023 04:52    Cardiac Studies   NA  Patient Profile     48 y.o. male with history of CAD, persistent atrial fib with previous cardioversions.  He was admitted with atrial fib with RVR.     Assessment & Plan  Atrial fib with RVR:  He had attempted DCCV but failed to maintain NSR.  The plan was load with amiodarone and to try cardioversion again.  However he went back into NSR on his own.  Send home on amiodarone 400 mg bid x 2 weeks.  We will get follow up in the atrial fib clinic and decide on when to discontinue amio and next therapies.     Sleep apnea:  Continue CPAP at home    HTN:  Continue previous meds.    DVT:  Previous history.  He is on lifelong anticoag.    CAD:  History of CABG and LAA ligation.    Dyslipidemia:   Was on Lipitor prior to admission.   For questions or updates, please contact [facility] Please consult [email or web address] for contact info under Cardiology/STEMI.   Signed, Rollene Rotunda, MD  12/09/2023, 8:18 AM

## 2023-12-09 NOTE — TOC CM/SW Note (Signed)
 Transition of Care [facility]) - Inpatient Brief Assessment   Patient Details  Name: [place] E Mas MRN: [id number] Date of Birth: 1976-02-17  Transition of Care [facility]) CM/SW Contact:    Leone Haven, RN Phone Number: 12/09/2023, 10:29 AM   Clinical Narrative: From home mom lives with him , has PCP and insurance on file, states has no HH services in place at this time or DME at home.  States friend will transport them home at [employer] and mom and friend  is support system, states gets medications from [facility].  Pta self ambulatory.   Transition of Care Asessment: Insurance and Status: Insurance coverage has been reviewed Patient has primary care physician: Yes Home environment has been reviewed: Mom lives with him in house Prior level of function:: indep Prior/Current Home Services: No current home services Social Drivers of Health Review: SDOH reviewed no interventions necessary Readmission risk has been reviewed: Yes

## 2023-12-09 NOTE — Plan of Care (Signed)
  Problem: Pain Managment: Goal: General experience of comfort will improve and/or be controlled Outcome: Progressing   Problem: Safety: Goal: Ability to remain free from injury will improve Outcome: Progressing

## 2023-12-09 NOTE — TOC Transition Note (Signed)
 Transition of Care [facility]) - Discharge Note   Patient Details  Name: Joseph Lang MRN: [id number] Date of Birth: [birth year]  Transition of Care [facility]) CM/SW Contact:  Leone Haven, RN Phone Number: 12/09/2023, 10:32 AM   Clinical Narrative:    For dc today, has no needs.         Patient Goals and CMS Choice            Discharge Placement                       Discharge Plan and Services Additional resources added to the After Visit Summary for                                       Social Drivers of Health (SDOH) Interventions SDOH Screenings   Depression [phone]): Low Risk  (12/28/2020)  Social Connections: Unknown (02/23/2022)   Received from [facility]  Tobacco Use: Medium Risk (12/08/2023)     Readmission Risk Interventions     No data to display

## 2023-12-09 NOTE — Discharge Summary (Signed)
 Physician Discharge Summary   Patient: Joseph Lang MRN: [id number] DOB: 1976/07/01  Admit date:     12/08/2023  Discharge date: 12/09/23  Discharge Physician: Deanna Artis   PCP: Georgann Housekeeper, MD   Recommendations at discharge:   At this time patient will be discharged home.  If you experience any symptoms such as fever, vomiting, shortness of breath, chest pain, abdominal pain, or other concerning symptoms, please call your primary care provider or go to the emergency department immediately.  Discharge Diagnoses: Principal Problem:   A-fib [facility]) Active Problems:   Atrial fibrillation with rapid ventricular response (HCC)  Resolved Problems:   * No resolved hospital problems. *  Hospital Course: HPI: Joseph E Goodchild is a 48 y.o. male with medical history significant of known atrial fibrillation for which patient has previously had a rhythm control strategy with cardioversion.  Patient is chronically anticoagulated.  Patient is also obese and the use of CPAP at home.  Patient was in his usual state of health till approximately 3 AM when he reports an abrupt onset of palpitations.  No chest pain no fever no shortness of breath no presyncope no leg swelling.  Patient came to the ER was found to have rapid ventricular rate as high as 158.  Patient blood pressure fluctuating between 149/133 and 110/92.   Patient has been started on diltiazem infusion.  Patient is pretty much asymptomatic at this time.  Heart rate varying between 110 and 130 resting.  Medical evaluation is sought.  Patient has also been started on amiodarone infusion.  Cardiology evaluation is pending  Assessment and Plan:  Atrial fibrillation with RVR -Prior known/ chronic. Previously chose rhythm strategy.  However now seems to have relapse of atrial fibrillation with RVR and NSVT.  Patient strongly prefers rhythm control. Dr. Royann Shivers attemtped cardioversion X 3 in ER. Reverted to afib.  Responded well to amiodarone  infusion, spontaneously converting to NSR.Marland Kitchen  Cardiology following closely.  Patient's symptoms have resolved, patient ambulatory, eager for discharge home.  Per cardiology recommendations, will transition patient to p.o. amiodarone 400 mg twice daily.  Patient subsequently has a follow-up appointment with A-fib clinic 3/4 and 9:30 AM.        Consultants: Cardiology Procedures performed: Cardioversion Disposition: Home Diet recommendation:  Discharge Diet Orders (From admission, onward)     Start     Ordered   12/09/23 0000  Diet - low sodium heart healthy        12/09/23 1033           Cardiac diet DISCHARGE MEDICATION: Allergies as of 12/09/2023       Reactions   Amoxicillin Nausea And Vomiting   Doxycycline Hyclate Rash   Sulfamethoxazole-trimethoprim Hives, Rash, Other (See Comments)   White oral lesions        Medication List     TAKE these medications    ALPRAZolam 0.5 MG tablet Commonly known as: XANAX Take 0.5 mg by mouth daily as needed.   amiodarone 200 MG tablet Commonly known as: Pacerone Take 2 tablets (400 mg total) by mouth 2 (two) times daily.   aspirin EC 81 MG tablet Take 1 tablet (81 mg total) by mouth daily.   atorvastatin 80 MG tablet Commonly known as: LIPITOR Take 1 tablet (80 mg total) by mouth daily.   benazepril 10 MG tablet Commonly known as: LOTENSIN Take 1 tablet (10 mg total) by mouth daily.   isosorbide mononitrate 30 MG 24 hr tablet Commonly known as:  IMDUR Take 0.5 tablets (15 mg total) by mouth daily.   metoprolol tartrate 50 MG tablet Commonly known as: LOPRESSOR Take 1 tablet (50 mg total) by mouth 2 (two) times daily.   ondansetron 4 MG tablet Commonly known as: ZOFRAN Take 1 tablet (4 mg total) by mouth every 6 (six) hours as needed for nausea or vomiting.   RABEprazole 20 MG tablet Commonly known as: ACIPHEX Take 20 mg by mouth daily.   Vitamin D3 75 MCG (3000 UT) Tabs Take 1 tablet by mouth daily.    Xarelto 20 MG Tabs tablet Generic drug: rivaroxaban Take 1 tablet (20 mg total) by mouth daily with supper.        Follow-up Information     Georgann Housekeeper, MD. Go on 12/11/2023.   Specialty: Internal Medicine Why: @1 :15pm Contact information: [address] [place] [address] [phone]         [facility] Follow up on 12/17/2023.   Specialty: Cardiology Why: 9:30AM. Afib clinic follow up Contact information: [address] [place] [address] [phone]               Discharge Exam: Ceasar Mons Weights   12/08/23 0404 12/08/23 1300 12/09/23 0517  Weight: (!) 190.5 kg (!) 187.1 kg (!) 188 kg   GENERAL:  Alert, pleasant, no acute distress, obese HEENT:  EOMI CARDIOVASCULAR:  RRR, no murmurs appreciated RESPIRATORY:  Clear to auscultation, no wheezing, rales, or rhonchi GASTROINTESTINAL:  Soft, nontender, nondistended EXTREMITIES:  No LE edema bilaterally NEURO:  No new focal deficits appreciated SKIN:  No rashes noted PSYCH:  Appropriate mood and affect    Condition at discharge: improving  The results of significant diagnostics from this hospitalization (including imaging, microbiology, ancillary and laboratory) are listed below for reference.   Imaging Studies: DG Chest Portable 1 View Result Date: 12/08/2023 CLINICAL DATA:  48 year old male with palpitations. EXAM: PORTABLE CHEST 1 VIEW COMPARISON:  CTA Chest, Abdomen, and Pelvis 11/02/2023 and earlier. FINDINGS: Portable AP semi upright view at 0430 hours. Pacer or resuscitation pads project over the left chest. Improved lung volumes compared to 11/02/2023. Chronic sternotomy. Normal cardiac size and mediastinal contours. Visualized tracheal air column is within normal limits. Stable lung markings since last year. Allowing for portable technique the lungs are clear. No pneumothorax or pleural effusion. Stable visualized  osseous structures. IMPRESSION: No acute cardiopulmonary abnormality. Electronically Signed   By: Odessa Fleming M.D.   On: 12/08/2023 04:52    Microbiology: Results for orders placed or performed during the hospital encounter of 06/27/20  Surgical pcr screen     Status: Abnormal   Collection Time: 07/01/20  3:00 AM   Specimen: Nasal Mucosa; Nasal Swab  Result Value Ref Range Status   MRSA, PCR POSITIVE (A) NEGATIVE Final    Comment: RESULT CALLED TO, READ BACK BY AND VERIFIED WITH: Wynelle Bourgeois RN 07/01/20 0620 JDW    Staphylococcus aureus POSITIVE (A) NEGATIVE Final    Comment: (NOTE) The Xpert SA Assay (FDA approved for NASAL specimens in patients 65 years of age and older), is one component of a comprehensive surveillance program. It is not intended to diagnose infection nor to guide or monitor treatment. Performed at [facility], [address], [place], [place] [address]     Labs: CBC: Recent Labs  Lab 12/08/23 0435  WBC 9.2  NEUTROABS 5.8  HGB 13.8  HCT 41.5  MCV 85.2  PLT 238   Basic Metabolic Panel: Recent Labs  Lab 12/08/23 0435 12/08/23 0642 12/09/23 0256  NA 141  --  139  K 3.9  --  3.8  CL 110  --  107  CO2 23  --  24  GLUCOSE 122*  --  106*  BUN 12  --  10  CREATININE 0.80  --  0.73  CALCIUM 9.5  --  8.9  MG  --  2.1  --    Liver Function Tests: No results for input(s): "AST", "ALT", "ALKPHOS", "BILITOT", "PROT", "ALBUMIN" in the last 168 hours. CBG: No results for input(s): "GLUCAP" in the last 168 hours.  Discharge time spent: less than 30 minutes.  Signed: Deanna Artis, DO [facility] 12/09/2023

## 2023-12-10 ENCOUNTER — Encounter: Payer: Self-pay | Admitting: Cardiology

## 2023-12-10 ENCOUNTER — Ambulatory Visit: Payer: Medicaid Other | Attending: Cardiology | Admitting: Cardiology

## 2023-12-10 VITALS — BP 137/73 | HR 59 | Resp 12 | Ht 74.0 in | Wt >= 6400 oz

## 2023-12-10 DIAGNOSIS — I480 Paroxysmal atrial fibrillation: Secondary | ICD-10-CM | POA: Diagnosis not present

## 2023-12-10 DIAGNOSIS — I10 Essential (primary) hypertension: Secondary | ICD-10-CM

## 2023-12-10 DIAGNOSIS — I25118 Atherosclerotic heart disease of native coronary artery with other forms of angina pectoris: Secondary | ICD-10-CM | POA: Diagnosis not present

## 2023-12-10 DIAGNOSIS — E7800 Pure hypercholesterolemia, unspecified: Secondary | ICD-10-CM

## 2023-12-10 DIAGNOSIS — D6859 Other primary thrombophilia: Secondary | ICD-10-CM

## 2023-12-10 DIAGNOSIS — E66813 Obesity, class 3: Secondary | ICD-10-CM

## 2023-12-10 DIAGNOSIS — Z6841 Body Mass Index (BMI) 40.0 and over, adult: Secondary | ICD-10-CM

## 2023-12-10 NOTE — Progress Notes (Signed)
 Cardiology Office Note:  .   Date:  12/10/2023  ID:  Joseph E Nicoletti, DOB August 31, 1976, MRN [id number] PCP: Georgann Housekeeper, MD  [facility] Providers Cardiologist:  Yates Decamp, MD   History of Present Illness: .   Joseph Lang is a 48 y.o. Caucasian  male patient with past medical history of paroxysmal atrial fibrillation and cardioversion on 01/17/2020 and 07/01/2020, hypertension, morbid obesity, obstructive sleep apnea on CPAP, tobacco use disorder still vaping, history of acute deep vein thrombosis and pulmonary embolism on 04/08/2015 following gastric sleeve resection and again presented with spontaneous right leg DVT on 03/07/2019 presently on long term Xarelto.   Due to multivessel coronary disease underwent CABG x 2 with LIMA to LAD and left radial to D1 and LAA ligation on 06/27/2020.   He has not been smoking any cigarettes anymore however still vaping. Has remained quit from consuming alcohol. Patient presented to the emergency room on 12/08/2023 with A-fib with RVR, failed cardioversion x 3 and eventually started on IV amiodarone and spontaneously converted to sinus rhythm and discharged home the following day.  He now presents for follow-up after discharge from the hospital yesterday.  Patient states that he does not want to be on amiodarone and will write to discontinue this.  States that he is presently feeling well except for generalized fatigue since CABG.  He is also frustrated by inability to lose weight and wants to be on Ozempic.  Discussed the use of AI scribe software for clinical note transcription with the patient, who gave verbal consent to proceed.  History of Present Illness   The patient, with a history of atrial fibrillation (AFib) and heart surgery, presents after a recent hospitalization for rapid heartbeat. He reports that he was not asleep when the episode occurred and was aware that he was in AFib. Despite attempts to cough hard to restore rhythm, he required  ambulance transport to the hospital. At the hospital, he was treated with amiodarone and cardioversion, which eventually restored normal rhythm. He expresses concern about the side effects of amiodarone and questions the need for its continued use now that his heart is back in rhythm.  In addition to his cardiac concerns, he reports feeling unwell daily since his heart surgery, with increased anger and frustration. He also mentions caring for his mother, who has become less active and requires reminders to perform daily tasks and take her medications. This added stress may be contributing to his emotional state.  The patient also reports constipation and rectal bleeding, which he attributes to the hospital stay. He denies blood in the stool. He also mentions a persistent rash and indentation marks on his legs from wearing socks.  He expresses frustration with his weight and difficulty losing weight despite efforts. He mentions a previous weight loss surgery and expresses interest in medication-assisted weight loss, specifically Ozempic or Wegovy.      Labs    Lab Results  Component Value Date   NA 139 12/09/2023   K 3.8 12/09/2023   CO2 24 12/09/2023   GLUCOSE 106 (H) 12/09/2023   BUN 10 12/09/2023   CREATININE 0.73 12/09/2023   CALCIUM 8.9 12/09/2023   EGFR 109 12/28/2020   GFRNONAA >60 12/09/2023      Latest Ref Rng & Units 12/09/2023    2:56 AM 12/08/2023    4:35 AM 11/02/2023    9:00 AM  BMP  Glucose 70 - 99 mg/dL 962  952  841   BUN 6 -  20 mg/dL 10  12  10    Creatinine 0.61 - 1.24 mg/dL 4.03  4.74  2.59   Sodium 135 - 145 mmol/L 139  141  136   Potassium 3.5 - 5.1 mmol/L 3.8  3.9  4.0   Chloride 98 - 111 mmol/L 107  110  105   CO2 22 - 32 mmol/L 24  23  18    Calcium 8.9 - 10.3 mg/dL 8.9  9.5  9.7       Latest Ref Rng & Units 12/08/2023    4:35 AM 11/02/2023    9:00 AM 09/22/2023    6:58 AM  CBC  WBC 4.0 - 10.5 K/uL 9.2  13.3  12.6   Hemoglobin 13.0 - 17.0 g/dL 56.3  87.5   64.3   Hematocrit 39.0 - 52.0 % 41.5  44.7  44.7   Platelets 150 - 400 K/uL 238  308  309    Lab Results  Component Value Date   HGBA1C 5.9 (H) 12/28/2020    Lab Results  Component Value Date   TSH 1.518 12/09/2023    External Labs:  PCP labs on [facility] 07/10/2023:  Total cholesterol 153, triglycerides 152, HDL 40, LDL 87.  Review of Systems  Constitutional: Positive for malaise/fatigue.  Cardiovascular:  Negative for chest pain, dyspnea on exertion and leg swelling.   Physical Exam:   VS:  BP 137/73 (BP Location: Left Wrist, Patient Position: Sitting, Cuff Size: Large)   Pulse (!) 59   Resp 12   Ht 6\' 2"  (1.88 m)   Wt (!) 417 lb 12.8 oz (189.5 kg)   SpO2 95%   BMI 53.64 kg/m    Wt Readings from Last 3 Encounters:  12/10/23 (!) 417 lb 12.8 oz (189.5 kg)  12/09/23 (!) 414 lb 8 oz (188 kg)  10/14/23 (!) 420 lb (190.5 kg)     Physical Exam Constitutional:      Appearance: He is morbidly obese.  Neck:     Vascular: No carotid bruit or JVD.  Cardiovascular:     Rate and Rhythm: Normal rate and regular rhythm.     Pulses: Intact distal pulses.     Heart sounds: Normal heart sounds. No murmur heard.    No gallop.  Pulmonary:     Effort: Pulmonary effort is normal.     Breath sounds: Normal breath sounds.  Abdominal:     General: Bowel sounds are normal.     Palpations: Abdomen is soft.  Musculoskeletal:     Right lower leg: No edema.     Left lower leg: No edema.    Studies Reviewed: Marland Kitchen    CABG x 2 06/27/2020: LIMA to LAD and free left radial to D1 and LAA appendage ligation.  EKG:    EKG 12/08/2023: A-fib with RVR at the rate of 146 bpm, 5 NSVT episodes versus aberrant conduction with RBBB morphology.  Nonspecific T abnormality.  Repeat EKG 12/08/2023: Sinus bradycardia at the rate of 58 bpm, low voltage complexes otherwise normal.  EKG 06/10/2023: Normal sinus rhythm at rate of 53 bpm, normal axis, poor R progression, cannot exclude anteroseptal infarct old.  Low-voltage complexes.   Medications and allergies    Allergies  Allergen Reactions   Amoxicillin Nausea And Vomiting   Doxycycline Hyclate Rash   Sulfamethoxazole-Trimethoprim Hives, Rash and Other (See Comments)    White oral lesions     Current Outpatient Medications:    ALPRAZolam (XANAX) 0.5 MG tablet, Take 0.5 mg by mouth  daily as needed., Disp: , Rfl:    amiodarone (PACERONE) 200 MG tablet, Take 2 tablets (400 mg total) by mouth 2 (two) times daily., Disp: 120 tablet, Rfl: 1   aspirin EC 81 MG tablet, Take 1 tablet (81 mg total) by mouth daily., Disp: 90 tablet, Rfl: 3   atorvastatin (LIPITOR) 80 MG tablet, Take 1 tablet (80 mg total) by mouth daily., Disp: 90 tablet, Rfl: 3   benazepril (LOTENSIN) 10 MG tablet, Take 1 tablet (10 mg total) by mouth daily., Disp: 90 tablet, Rfl: 3   Cholecalciferol (VITAMIN D3) 75 MCG (3000 UT) TABS, Take 1 tablet by mouth daily., Disp: , Rfl:    isosorbide mononitrate (IMDUR) 30 MG 24 hr tablet, Take 0.5 tablets (15 mg total) by mouth daily., Disp: 90 tablet, Rfl: 3   metoprolol tartrate (LOPRESSOR) 50 MG tablet, Take 1 tablet (50 mg total) by mouth 2 (two) times daily., Disp: 180 tablet, Rfl: 3   RABEprazole (ACIPHEX) 20 MG tablet, Take 20 mg by mouth daily. , Disp: , Rfl:    XARELTO 20 MG TABS tablet, Take 1 tablet (20 mg total) by mouth daily with supper., Disp: 90 tablet, Rfl: 3   ASSESSMENT AND PLAN: .      ICD-10-CM   1. Coronary artery disease involving native coronary artery of native heart with other form of angina pectoris (HCC)  I25.118 ECHOCARDIOGRAM COMPLETE    2. PAF (paroxysmal atrial fibrillation) (HCC)  I48.0 ECHOCARDIOGRAM COMPLETE    3. Primary hypercoagulable state (HCC)  D68.59     4. Primary hypertension  I10     5. Hypercholesteremia  E78.00 Lipid Profile    6. Class 3 severe obesity due to excess calories with serious comorbidity and body mass index (BMI) of 50.0 to 59.9 in adult [facility])  Z61.096 AMB Referral to  [facility]   Z68.43    E66.01       Assessment and Plan    Atrial Fibrillation (AFib)   Recurrent AFib led to hospitalization, and normal rhythm was restored with amiodarone. He wishes to stop amiodarone due to side effects. It is necessary to continue amiodarone for four weeks to prevent recurrence. A gradual dose reduction and future AFib management, including direct cardioversion to avoid ER visits, were discussed.   AFib is life-altering but not life-threatening, if you were to develop recurrence of atrial fibrillation, I advised him to call our office to be seen so he can avoid ED visit and his symptoms can be managed in the outpatient basis.  Will obtain an echocardiogram to reevaluate his LV systolic function and evaluate left atrial size.  Patient would like to come off of amiodarone.  Reduce amiodarone to 200 mg BID starting 12/11/2023 for one week, then 200 mg once daily for two weeks, and discontinue after two weeks. Order an echocardiogram and schedule an office visit in six months.  If he has recurrence of atrial fibrillation could consider different AAD.  Hyperlipidemia   LDL cholesterol remains elevated despite atorvastatin 80 mg daily. Reassess cholesterol levels with a new lab test. Order a cholesterol panel.  Obesity   Weight loss efforts have been challenging, and previous attempts with Ozempic were unsuccessful. Discussed Reginal Lutes as an alternative for weight management and plan to involve a pharmacist. Refer to pharmacy for [facility].  General Health Maintenance   He is compliant with CPAP for sleep apnea. Continue CPAP use.  Follow-up   Schedule a follow-up visit in six months and cancel the infant  clinic appointment on 01/17/2024.           Signed,  Yates Decamp, MD, [facility] 12/10/2023, 3:47 PM [facility] [address] [place], [place] [address] Phone: [phone]. Fax:  [phone]

## 2023-12-10 NOTE — Patient Instructions (Addendum)
.   Medication Instructions:   Reduce the dose of the amiodarone starting tomorrow on 12/11/2023 to 200 mg twice daily for 1 week.  After 1 week, reduce the dose to 200 mg once a day for 2 more weeks and then discontinue.  *If you need a refill on your cardiac medications before your next appointment, please call your pharmacy*   Lab Work: Have lipid profile checked at [facility] on the first floor today If you have labs (blood work) drawn today and your tests are completely normal, you will receive your results only by: MyChart Message (if you have MyChart) OR A paper copy in the mail If you have any lab test that is abnormal or we need to change your treatment, we will call you to review the results.   Testing/Procedures: Your physician has requested that you have an echocardiogram. Echocardiography is a painless test that uses sound waves to create images of your heart. It provides your doctor with information about the size and shape of your heart and how well your heart's chambers and valves are working. This procedure takes approximately one hour. There are no restrictions for this procedure. Please do NOT wear cologne, perfume, aftershave, or lotions (deodorant is allowed). Please arrive 15 minutes prior to your appointment time.  Please note: We ask at that you not bring children with you during ultrasound (echo/ vascular) testing. Due to room size and safety concerns, children are not allowed in the ultrasound rooms during exams. Our front office staff cannot provide observation of children in our lobby area while testing is being conducted. An adult accompanying a patient to their appointment will only be allowed in the ultrasound room at the discretion of the ultrasound technician under special circumstances. We apologize for any inconvenience.    Follow-Up: At [facility], you and your health needs are our priority.  As part of our continuing mission to provide you with  exceptional heart care, we have created designated Provider Care Teams.  These Care Teams include your primary Cardiologist (physician) and Advanced Practice Providers (APPs -  Physician Assistants and Nurse Practitioners) who all work together to provide you with the care you need, when you need it.  We recommend signing up for the patient portal called "MyChart".  Sign up information is provided on this After Visit Summary.  MyChart is used to connect with patients for Virtual Visits (Telemedicine).  Patients are able to view lab/test results, encounter notes, upcoming appointments, etc.  Non-urgent messages can be sent to your provider as well.   To learn more about what you can do with MyChart, go to [email or web address].    Your next appointment:   6 month(s)  Provider:   Yates Decamp, MD     Other Instructions     You have been referred to see the pharmacist in our office.  Please schedule new patient appointment

## 2023-12-11 ENCOUNTER — Ambulatory Visit: Payer: Self-pay | Admitting: Cardiology

## 2023-12-11 LAB — LIPID PANEL
Chol/HDL Ratio: 3.7 {ratio} (ref 0.0–5.0)
Cholesterol, Total: 159 mg/dL (ref 100–199)
HDL: 43 mg/dL (ref >39)
LDL Chol Calc (NIH): 91 mg/dL (ref 0–99)
Triglycerides: 145 mg/dL (ref 0–149)
VLDL Cholesterol Cal: 25 mg/dL (ref 5–40)

## 2023-12-15 ENCOUNTER — Encounter: Payer: Self-pay | Admitting: Cardiology

## 2023-12-15 MED ORDER — EZETIMIBE 10 MG PO TABS: 10.0000 mg | ORAL_TABLET | Freq: Every day | ORAL | 3 refills | Status: AC

## 2023-12-15 NOTE — Addendum Note (Signed)
 Addended by: Delrae Rend on: 12/15/2023 05:37 AM   Modules accepted: Orders

## 2023-12-15 NOTE — Progress Notes (Signed)
 LDL is still not at goal, less than 70.  Will add Zetia 10 mg daily, Rx has been sent.  Take it in the evening.

## 2023-12-17 ENCOUNTER — Ambulatory Visit (HOSPITAL_COMMUNITY): Payer: Medicaid Other | Admitting: Internal Medicine

## 2024-01-02 ENCOUNTER — Ambulatory Visit (HOSPITAL_COMMUNITY): Payer: Medicaid Other | Attending: Cardiovascular Disease

## 2024-01-02 DIAGNOSIS — I25118 Atherosclerotic heart disease of native coronary artery with other forms of angina pectoris: Secondary | ICD-10-CM | POA: Insufficient documentation

## 2024-01-02 DIAGNOSIS — I480 Paroxysmal atrial fibrillation: Secondary | ICD-10-CM | POA: Diagnosis present

## 2024-01-02 LAB — ECHOCARDIOGRAM COMPLETE
Area-P 1/2: 2.55 cm2
S' Lateral: 3.4 cm

## 2024-01-03 ENCOUNTER — Encounter: Payer: Self-pay | Admitting: Cardiology

## 2024-01-03 NOTE — Progress Notes (Signed)
 Normal echo.

## 2024-01-29 ENCOUNTER — Ambulatory Visit: Payer: Medicaid Other | Admitting: Pharmacist

## 2024-02-28 IMAGING — US US EXTREM LOW VENOUS*R*
1 series · 13 of 24 positions shown · non-contrast
Comparison: None.

CLINICAL DATA: swelling calf, concern dvt



[Series 1: us venous img lower uni right (dvt) · portal-venous · 13 of 39 slices shown]
[im 1/39]
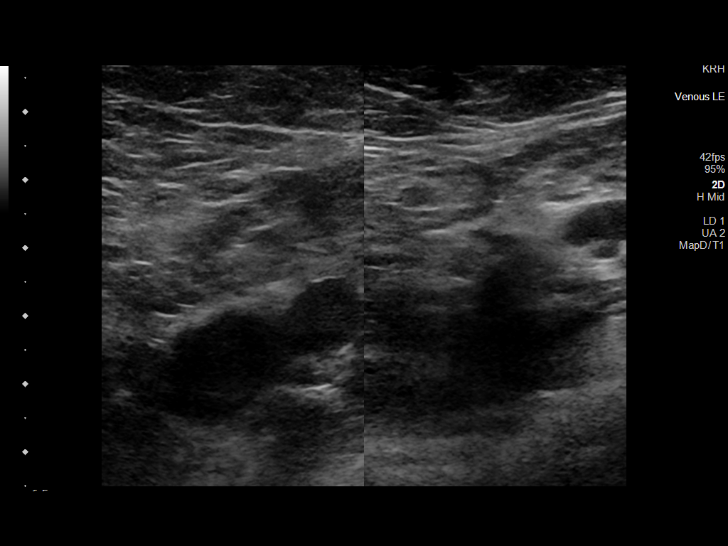
[im 4/39]
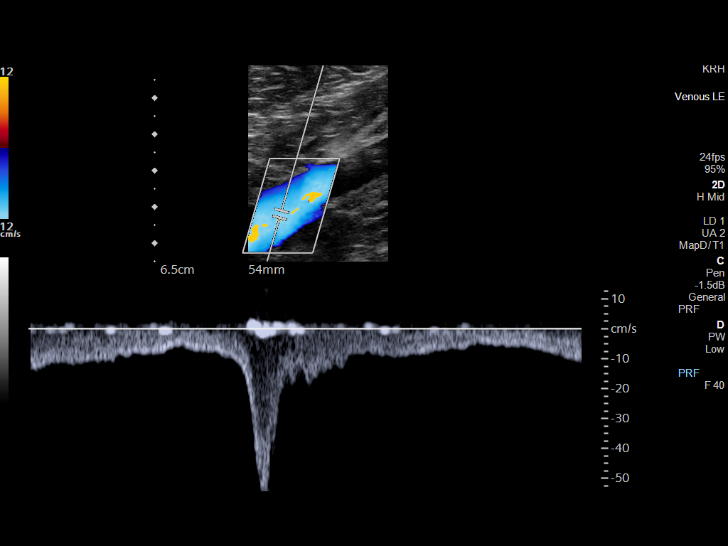
[im 7/39]
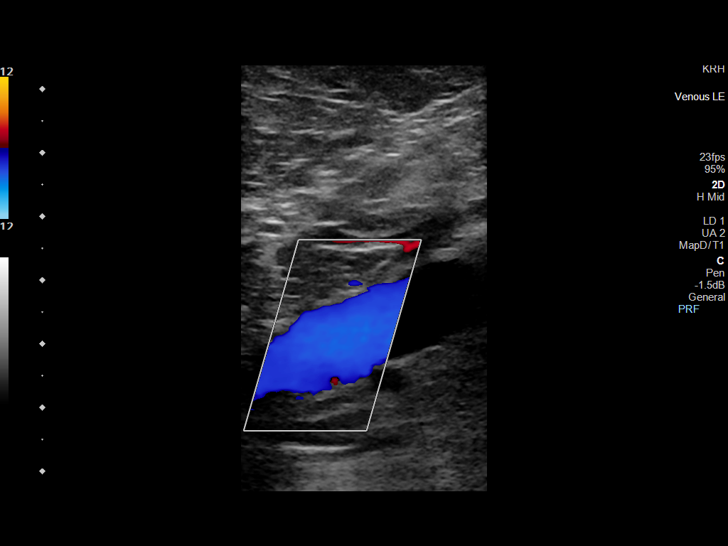
[im 10/39]
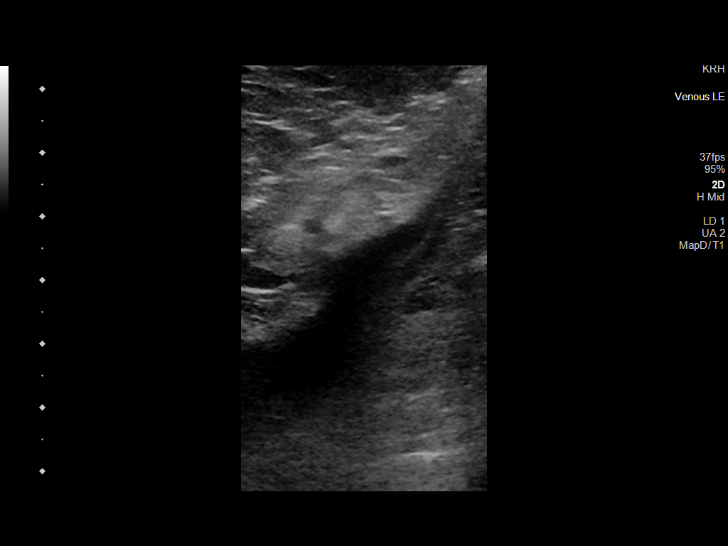
[im 14/39]
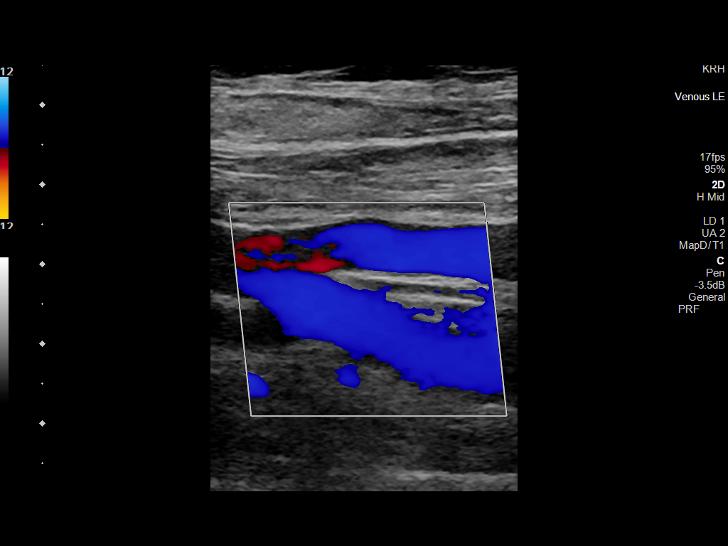
[im 17/39]
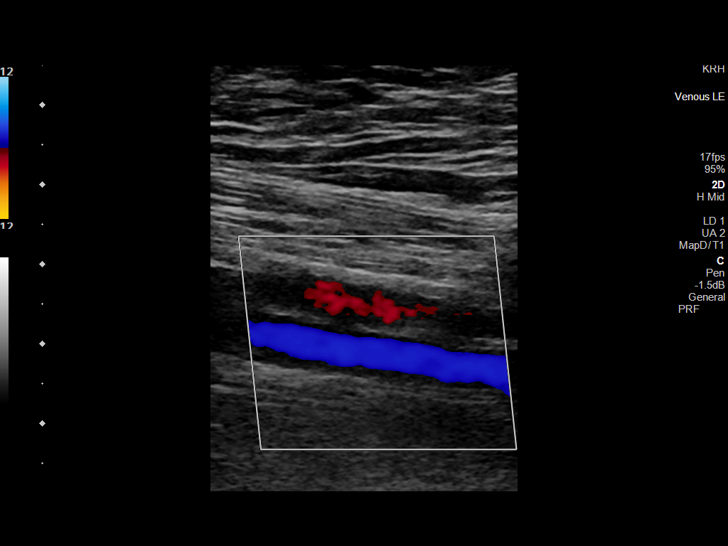
[im 20/39]
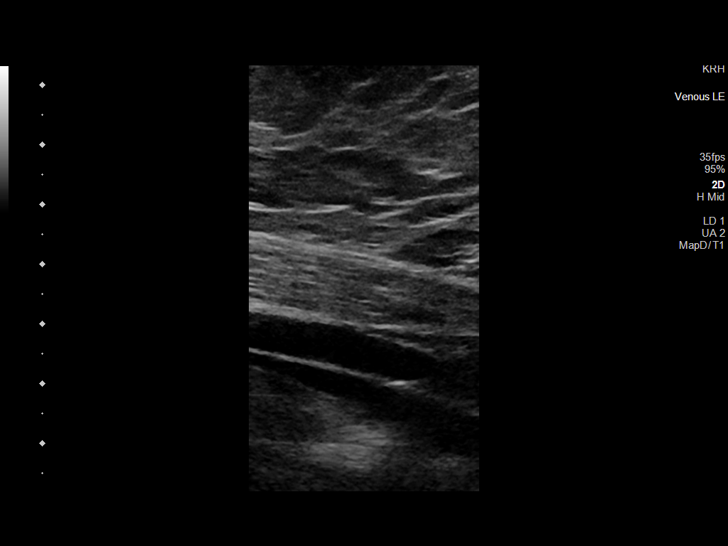
[im 22/39]
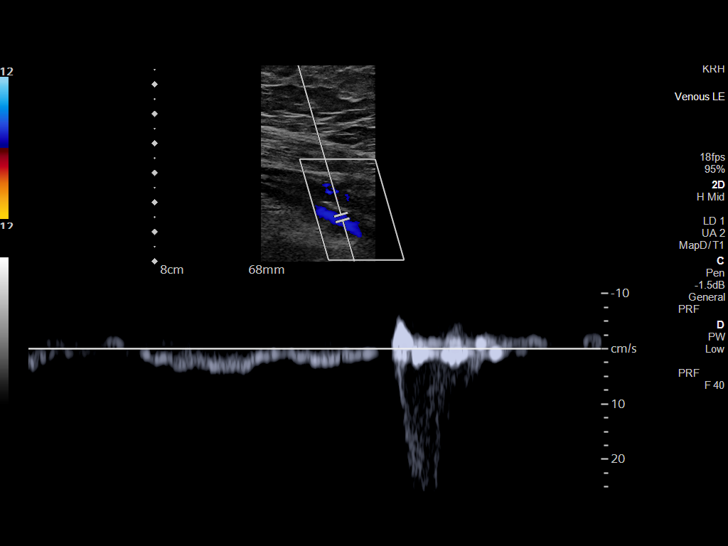
[im 25/39]
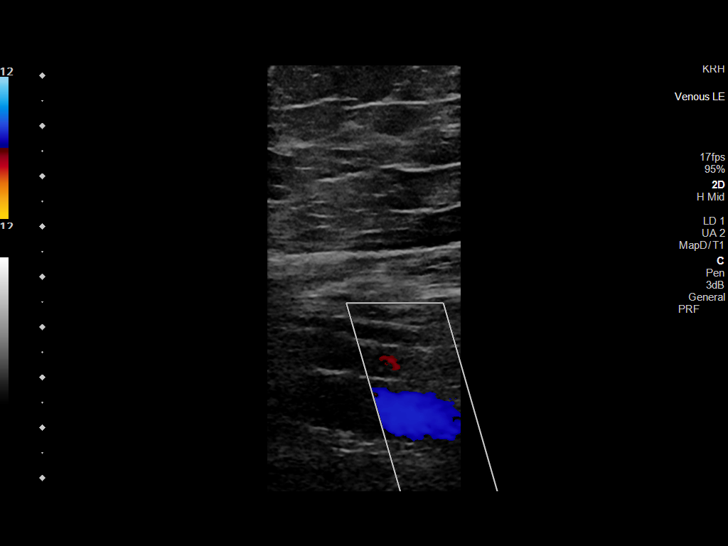
[im 29/39]
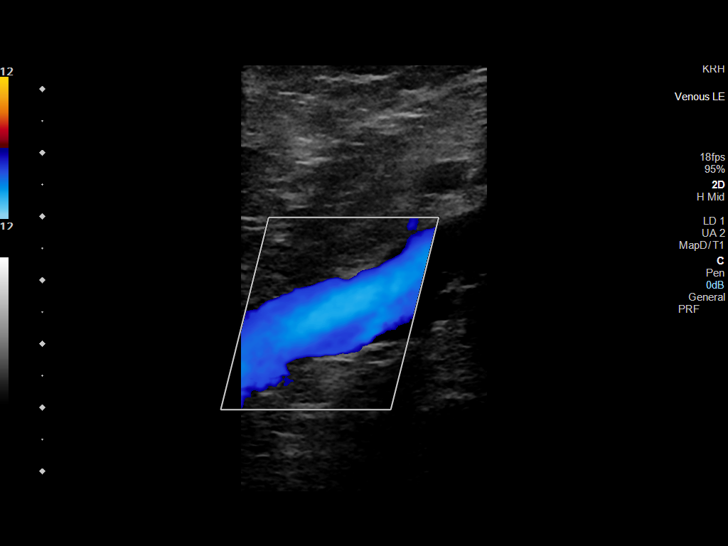
[im 32/39]
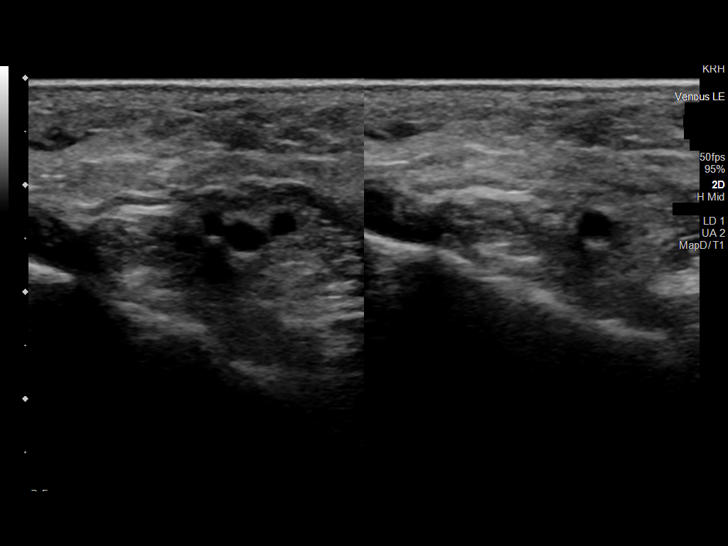
[im 35/39]
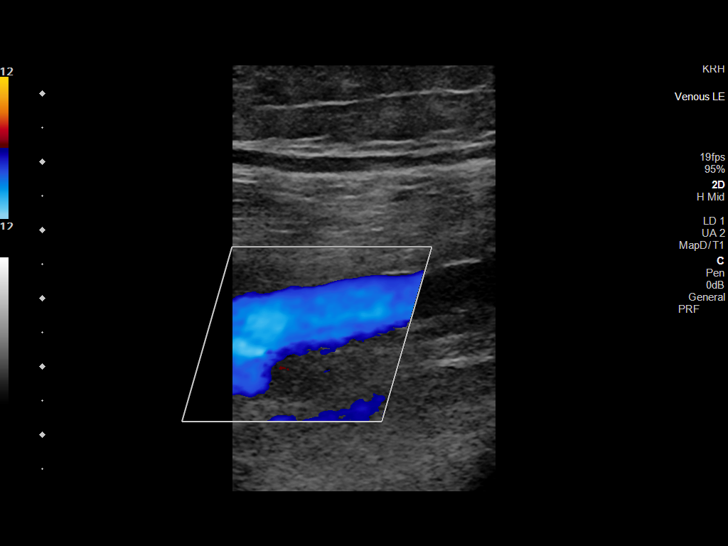
[im 39/39]
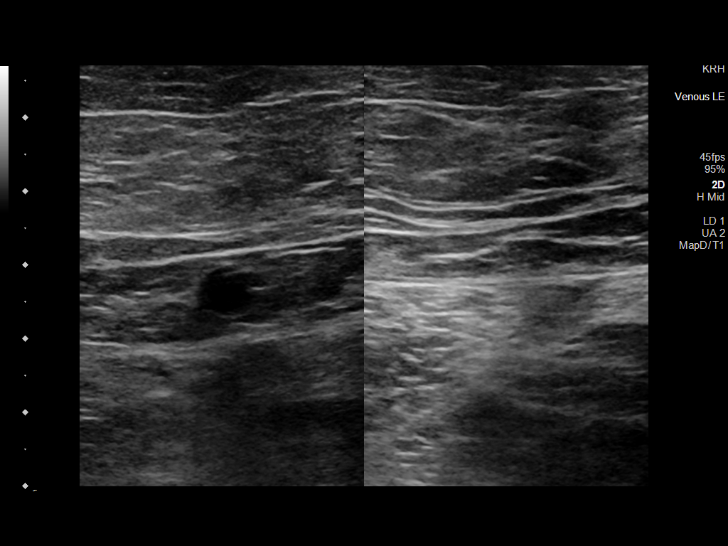

[13 of 24 positions shown; findings below may reference images not displayed]

FINDINGS: Contralateral Common Femoral Vein: Respiratory phasicity is normal
and symmetric with the symptomatic side. No evidence of thrombus.
Normal compressibility.

Common Femoral Vein: No evidence of thrombus. Normal
compressibility, respiratory phasicity and response to augmentation.

Saphenofemoral Junction: No evidence of thrombus. Normal
compressibility and flow on color Doppler imaging.

Profunda Femoral Vein: No evidence of thrombus. Normal
compressibility and flow on color Doppler imaging.

Femoral Vein: No evidence of thrombus proximally. The mid to distal
femoral vein is poorly visualized due to body habitus, but there is
no evidence of thrombus.

Popliteal Vein: No evidence of thrombus. Normal compressibility,
respiratory phasicity and response to augmentation.

Calf Veins: Poorly visualized posterior tibial, but there is no
evidence of thrombus. The peroneal vein is not visualized.

Superficial Great Saphenous Vein: No evidence of thrombus. Normal
compressibility.

Venous Reflux:  None.

Other Findings: Limited exam due to body habitus. There is
superficial soft tissue swelling of the ankle.
IMPRESSION: No evidence of DVT in the right lower extremity. Note that the mid
to distal femoral vein and posterior tibial vein are poorly
visualized due to body habitus. Peroneal vein is not visualized.

Superficial soft tissue swelling at the ankle.

## 2024-05-30 ENCOUNTER — Encounter (HOSPITAL_BASED_OUTPATIENT_CLINIC_OR_DEPARTMENT_OTHER): Payer: Self-pay

## 2024-05-30 ENCOUNTER — Emergency Department (HOSPITAL_BASED_OUTPATIENT_CLINIC_OR_DEPARTMENT_OTHER)
Admission: EM | Admit: 2024-05-30 | Discharge: 2024-05-31 | Disposition: A | Discharge status: Home / Self Care | Attending: Emergency Medicine | Admitting: Emergency Medicine

## 2024-05-30 ENCOUNTER — Other Ambulatory Visit: Payer: Self-pay

## 2024-05-30 DIAGNOSIS — N12 Tubulo-interstitial nephritis, not specified as acute or chronic: Secondary | ICD-10-CM | POA: Insufficient documentation

## 2024-05-30 DIAGNOSIS — Z7901 Long term (current) use of anticoagulants: Secondary | ICD-10-CM | POA: Insufficient documentation

## 2024-05-30 DIAGNOSIS — D72829 Elevated white blood cell count, unspecified: Secondary | ICD-10-CM | POA: Insufficient documentation

## 2024-05-30 DIAGNOSIS — I10 Essential (primary) hypertension: Secondary | ICD-10-CM | POA: Diagnosis not present

## 2024-05-30 DIAGNOSIS — R300 Dysuria: Secondary | ICD-10-CM | POA: Diagnosis present

## 2024-05-30 DIAGNOSIS — Z79899 Other long term (current) drug therapy: Secondary | ICD-10-CM | POA: Diagnosis not present

## 2024-05-30 DIAGNOSIS — Z7982 Long term (current) use of aspirin: Secondary | ICD-10-CM | POA: Insufficient documentation

## 2024-05-30 DIAGNOSIS — I2510 Atherosclerotic heart disease of native coronary artery without angina pectoris: Secondary | ICD-10-CM | POA: Diagnosis not present

## 2024-05-30 LAB — CBC
HCT: 41.5 % (ref 39.0–52.0)
Hemoglobin: 13.8 g/dL (ref 13.0–17.0)
MCH: 28.5 pg (ref 26.0–34.0)
MCHC: 33.3 g/dL (ref 30.0–36.0)
MCV: 85.6 fL (ref 80.0–100.0)
Platelets: 256 K/uL (ref 150–400)
RBC: 4.85 MIL/uL (ref 4.22–5.81)
RDW: 14.2 % (ref 11.5–15.5)
WBC: 16.5 K/uL — ABNORMAL HIGH (ref 4.0–10.5)
nRBC: 0 % (ref 0.0–0.2)

## 2024-05-30 LAB — BASIC METABOLIC PANEL WITH GFR
Anion gap: 11 (ref 5–15)
BUN: 11 mg/dL (ref 6–20)
CO2: 24 mmol/L (ref 22–32)
Calcium: 9.5 mg/dL (ref 8.9–10.3)
Chloride: 105 mmol/L (ref 98–111)
Creatinine, Ser: 0.92 mg/dL (ref 0.61–1.24)
GFR, Estimated: >60 mL/min (ref >60)
Glucose, Bld: 109 mg/dL — ABNORMAL HIGH (ref 70–99)
Potassium: 4 mmol/L (ref 3.5–5.1)
Sodium: 140 mmol/L (ref 135–145)

## 2024-05-30 NOTE — ED Triage Notes (Signed)
 Pt reports he is here today due to bilateral flank pain. Pt reports burning urination. Pt reports x2 weeks. Pt reports he developed a 100.2 fever today. Pt reports h/o UTI.

## 2024-05-31 LAB — URINALYSIS, ROUTINE W REFLEX MICROSCOPIC
Bilirubin Urine: NEGATIVE
Bilirubin Urine: NEGATIVE
Glucose, UA: NEGATIVE mg/dL
Glucose, UA: NEGATIVE mg/dL
Hgb urine dipstick: NEGATIVE
Hgb urine dipstick: NEGATIVE
Ketones, ur: NEGATIVE mg/dL
Ketones, ur: NEGATIVE mg/dL
Nitrite: NEGATIVE
Nitrite: NEGATIVE
Protein, ur: NEGATIVE mg/dL
Protein, ur: NEGATIVE mg/dL
Specific Gravity, Urine: 1.009 (ref 1.005–1.030)
Specific Gravity, Urine: 1.009 (ref 1.005–1.030)
pH: 6.5 (ref 5.0–8.0)
pH: 6.5 (ref 5.0–8.0)

## 2024-05-31 MED ORDER — CEPHALEXIN 500 MG PO CAPS: 500.0000 mg | ORAL_CAPSULE | Freq: Two times a day (BID) | ORAL | 0 refills | Status: AC

## 2024-05-31 MED ORDER — CEPHALEXIN 250 MG PO CAPS
500.0000 mg | ORAL_CAPSULE | Freq: Once | ORAL | Status: AC
  Administered 2024-05-31: 500 mg via ORAL
  Filled 2024-05-31: qty 2

## 2024-05-31 NOTE — ED Provider Notes (Signed)
 [place] EMERGENCY DEPARTMENT AT [facility]  Provider Note  CSN: [id number] Arrival date & time: 05/30/24 2238  History Chief Complaint  Patient presents with   Flank Pain    Joseph Lang is a 48 y.o. male with history of UTI, renal stones, HTN, PE, CAD, here with 2 weeks of dysuria progressing to R>L flank pain and low grade fever/chills in the last 2 days. He states pain is different from prior renal stones. Denies vomiting.    Home Medications Prior to Admission medications   Medication Sig Start Date End Date Taking? Authorizing Provider  cephALEXin  (KEFLEX ) 500 MG capsule Take 1 capsule (500 mg total) by mouth 2 (two) times daily for 7 days. 05/31/24 06/07/24 Yes Roselyn Carlin NOVAK, MD  ALPRAZolam  (XANAX ) 0.5 MG tablet Take 0.5 mg by mouth daily as needed. 12/06/23   [provider]  amiodarone  (PACERONE ) 200 MG tablet Take 2 tablets (400 mg total) by mouth 2 (two) times daily. 12/09/23   Arlon Carliss ORN, DO  aspirin  EC 81 MG tablet Take 1 tablet (81 mg total) by mouth daily. 04/23/20   Ladona Heinz, MD  atorvastatin  (LIPITOR ) 80 MG tablet Take 1 tablet (80 mg total) by mouth daily. 06/10/23   Ladona Heinz, MD  benazepril  (LOTENSIN ) 10 MG tablet Take 1 tablet (10 mg total) by mouth daily. 06/10/23   Ladona Heinz, MD  Cholecalciferol (VITAMIN D3) 75 MCG (3000 UT) TABS Take 1 tablet by mouth daily.    [provider]  ezetimibe  (ZETIA ) 10 MG tablet Take 1 tablet (10 mg total) by mouth daily. 12/15/23 03/14/24  Ladona Heinz, MD  isosorbide  mononitrate (IMDUR ) 30 MG 24 hr tablet Take 0.5 tablets (15 mg total) by mouth daily. 07/05/23   Ladona Heinz, MD  metoprolol  tartrate (LOPRESSOR ) 50 MG tablet Take 1 tablet (50 mg total) by mouth 2 (two) times daily. 06/10/23   Ladona Heinz, MD  RABEprazole (ACIPHEX) 20 MG tablet Take 20 mg by mouth daily.  01/04/20   [provider]  XARELTO  20 MG TABS tablet Take 1 tablet (20 mg total) by mouth daily with supper. 06/10/23   Ladona Heinz, MD     Allergies    Amoxicillin, Doxycycline hyclate, and Sulfamethoxazole -trimethoprim    Review of Systems   Review of Systems Please see HPI for pertinent positives and negatives  Physical Exam BP (!) 172/85   Pulse 66   Temp 98.4 F (36.9 C)   Resp 16   SpO2 96%   Physical Exam Vitals and nursing note reviewed.  Constitutional:      Appearance: He is obese.  HENT:     Head: Normocephalic.     Nose: Nose normal.  Eyes:     Extraocular Movements: Extraocular movements intact.  Pulmonary:     Effort: Pulmonary effort is normal.  Musculoskeletal:        General: Normal range of motion.     Cervical back: Neck supple.  Skin:    Findings: No rash (on exposed skin).  Neurological:     Mental Status: He is alert and oriented to person, place, and time.  Psychiatric:        Mood and Affect: Mood normal.     ED Results / Procedures / Treatments   EKG None  Procedures Procedures  Medications Ordered in the ED Medications  cephALEXin  (KEFLEX ) capsule 500 mg (500 mg Oral Given 05/31/24 0040)    Initial Impression and Plan  Patient here with specific concern  for UTI/kidney infection. Labs done in triage show CBC with leukocytosis, BMP is unremarkable, UA consistent with UTI. Discussed with patient concern for systemic infection and/or infected renal stone given his reported fever/chills and flank pain. Recommended IVF, IV abx, CT scan but he reports his ride needs to leave and he cannot stay for additional testing. Will send UA for culture. Give Rx for keflex  (no prior culture to review) and recommend close PCP follow up. Advised to RTED for uncontrolled pain, vomiting or worsening fever or for any other concerns.   ED Course       MDM Rules/Calculators/A&P Medical Decision Making Given presenting complaint, I considered that admission might be necessary. After review of results from ED lab and/or imaging studies, admission to the hospital is not indicated at  this time.    Problems Addressed: Pyelonephritis: acute illness or injury  Amount and/or Complexity of Data Reviewed Labs: ordered. Decision-making details documented in ED Course.  Risk Prescription drug management. Decision regarding hospitalization.     Final Clinical Impression(s) / ED Diagnoses Final diagnoses:  Pyelonephritis    Rx / DC Orders ED Discharge Orders          Ordered    cephALEXin  (KEFLEX ) 500 MG capsule  2 times daily        05/31/24 0036             Roselyn Carlin NOVAK, MD 05/31/24 [phone]

## 2024-06-02 LAB — URINE CULTURE: Culture: >=100000 — AB

## 2024-06-03 ENCOUNTER — Telehealth (HOSPITAL_BASED_OUTPATIENT_CLINIC_OR_DEPARTMENT_OTHER): Payer: Self-pay

## 2024-06-03 NOTE — Telephone Encounter (Signed)
 Post ED Visit - Positive Culture Follow-up  Culture report reviewed by antimicrobial stewardship pharmacist: [facility] [x]  Feliciano Close, Pharm.D. []  Venetia Gully, Pharm.D., BCPS AQ-ID []  Garrel Crews, Pharm.D., BCPS []  Almarie Lunger, [address].D., BCPS []  [place], [place].D., BCPS, AAHIVP []  Rosaline Bihari, Pharm.D., BCPS, AAHIVP []  Vernell Meier, PharmD, BCPS []  Latanya Hint, PharmD, BCPS []  Donald Medley, PharmD, BCPS []  Rocky Bold, PharmD []  Dorothyann Alert, PharmD, BCPS []  Morene Babe, PharmD  [facility] []  Rosaline Edison, PharmD []  Romona Bliss, PharmD []  Dolphus Roller, PharmD []  Veva Seip, Rph []  Vernell Daunt) Leonce, PharmD []  Eva Allis, PharmD []  Rosaline Millet, PharmD []  Iantha Batch, PharmD []  Arvin Gauss, PharmD []  Wanda Hasting, PharmD []  Ronal Rav, PharmD []  Rocky Slade, PharmD []  Bard Jeans, PharmD   Positive urine culture Treated with Cephalexin , organism sensitive to the same and no further patient follow-up is required at this time.  Ruth Camelia Elbe 06/03/2024, 9:58 AM

## 2024-06-05 ENCOUNTER — Telehealth: Payer: Self-pay | Admitting: Cardiology

## 2024-06-05 DIAGNOSIS — I10 Essential (primary) hypertension: Secondary | ICD-10-CM

## 2024-06-05 MED ORDER — BENAZEPRIL HCL 10 MG PO TABS: 10.0000 mg | ORAL_TABLET | Freq: Every day | ORAL | 3 refills | Status: AC

## 2024-06-05 NOTE — Telephone Encounter (Signed)
 Patient aware

## 2024-06-05 NOTE — Telephone Encounter (Signed)
*  STAT* If patient is at the pharmacy, call can be transferred to refill team.   1. Which medications need to be refilled? (please list name of each medication and dose if known)   benazepril  (LOTENSIN ) 10 MG tablet   2. Would you like to learn more about the convenience, safety, & potential cost savings by using the [facility]?   3. Are you open to using the [facility] (Type [facility]. ).  4. Which pharmacy/location (including street and city if local pharmacy) is medication to be sent to?  [facility] - [place], [place] - [address]   5. Do they need a 30 day or 90 day supply?   90 day  Patient stated he is completely out of this medication.

## 2024-06-18 ENCOUNTER — Other Ambulatory Visit: Payer: Self-pay | Admitting: Cardiology

## 2024-06-18 DIAGNOSIS — E7800 Pure hypercholesterolemia, unspecified: Secondary | ICD-10-CM

## 2024-08-04 ENCOUNTER — Telehealth: Payer: Self-pay | Admitting: Cardiology

## 2024-08-04 ENCOUNTER — Other Ambulatory Visit: Payer: Self-pay | Admitting: Cardiology

## 2024-08-04 DIAGNOSIS — I480 Paroxysmal atrial fibrillation: Secondary | ICD-10-CM

## 2024-08-04 DIAGNOSIS — I25118 Atherosclerotic heart disease of native coronary artery with other forms of angina pectoris: Secondary | ICD-10-CM

## 2024-08-04 MED ORDER — METOPROLOL TARTRATE 50 MG PO TABS
50.0000 mg | ORAL_TABLET | Freq: Two times a day (BID) | ORAL | 0 refills | Status: AC
Start: 2024-08-04 — End: ?

## 2024-08-04 NOTE — Telephone Encounter (Signed)
*  STAT* If patient is at the pharmacy, call can be transferred to refill team.   1. Which medications need to be refilled? (please list name of each medication and dose if known) metoprolol  tartrate (LOPRESSOR ) 50 MG tablet    2. Would you like to learn more about the convenience, safety, & potential cost savings by using the [facility]? no   3. Are you open to using the [facility] ([facility]. ). No    4. Which pharmacy/location (including street and city if local pharmacy) is medication to be sent to?[facility] - [place], [place] - [address]    5. Do they need a 30 day or 90 day supply? 90 day

## 2024-08-04 NOTE — Telephone Encounter (Signed)
 RX sent in

## 2024-08-10 ENCOUNTER — Ambulatory Visit: Admitting: Sports Medicine

## 2024-08-17 ENCOUNTER — Encounter: Payer: Self-pay | Admitting: Radiology

## 2024-09-15 ENCOUNTER — Telehealth: Payer: Self-pay | Admitting: Cardiology

## 2024-09-15 ENCOUNTER — Other Ambulatory Visit: Payer: Self-pay | Admitting: Cardiology

## 2024-09-15 NOTE — Telephone Encounter (Signed)
*  STAT* If patient is at the pharmacy, call can be transferred to refill team.   1. Which medications need to be refilled? (please list name of each medication and dose if known)   isosorbide  mononitrate (IMDUR ) 30 MG 24 hr tablet     2. Would you like to learn more about the convenience, safety, & potential cost savings by using the [facility]? No   3. Are you open to using the [facility] ([facility]. ) No   4. Which pharmacy/location (including street and city if local pharmacy) is medication to be sent to?  [facility] - [place], [place] - [address]   5. Do they need a 30 day or 90 day supply? 90 day

## 2024-09-16 MED ORDER — ISOSORBIDE MONONITRATE ER 30 MG PO TB24: 15.0000 mg | ORAL_TABLET | Freq: Every day | ORAL | 0 refills | Status: AC

## 2024-09-16 NOTE — Telephone Encounter (Signed)
 Pt scheduled to see Dr. Ladona 11/23/24, refill sent.

## 2024-09-24 ENCOUNTER — Encounter: Payer: Self-pay | Admitting: Adult Health

## 2024-09-24 ENCOUNTER — Telehealth: Payer: Medicaid Other | Admitting: Adult Health

## 2024-09-24 DIAGNOSIS — G4733 Obstructive sleep apnea (adult) (pediatric): Secondary | ICD-10-CM

## 2024-09-24 NOTE — Progress Notes (Signed)
 [facility] [address] [place]. [place] [address] [phone]       OFFICE FOLLOW UP NOTE  Mr. Joseph Lang Date of Birth:  08-04-1976 Medical Record Number:  [id number]   Primary neurologist: Dr. Buck Reason for visit:  CPAP follow-up   Virtual Visit via Video Note  I connected with Joseph Lang on 09/24/2024 at  2:45 PM EST by a video enabled telemedicine application and verified that I am speaking with the correct person using two identifiers.  Location: Patient: at home Provider: in office, [facility]   I discussed the limitations of evaluation and management by telemedicine and the availability of in person appointments. The patient expressed understanding and agreed to proceed.     SUBJECTIVE:   Follow-up visit:  Prior visit: 09/26/2023  Brief HPI:   Joseph Lang is a 48 y.o. male who is being followed for OSA on CPAP.  Previously on CPAP therapy and due for new machine.  Repeat HST 07/16/2023 showed moderate sleep apnea with total AHI of 19.6/h with O2 nadir of 85%.  Received new CPAP machine 08/08/2023.     Interval history:  Returns for CPAP compliance visit via MyChart video visit.  Continues to do well overall with CPAP therapy. Using full face mask but material will split easily along bridge of nose, typically needs to replace every 2-3 weeks. Has followed up with DME but no resolution regarding this. He is otherwise tolerating well. Notes continued benefit with CPAP usage.  No further questions or concerns at this time.       ROS:   14 system review of systems performed and negative with exception of those listed in HPI  PMH:  Past Medical History:  Diagnosis Date   Coronary artery disease 2021   Dysrhythmia 2021   Afib   GERD (gastroesophageal reflux disease)    History of blood clots    Hx of deep vein thrombophlebitis of lower extremity    Hx of gastric bypass    sleeve   Hx of pulmonary embolus    Hypertension     Obesity    Other hyperlipidemia    PONV (postoperative nausea and vomiting)    Throwing up after wt loss surgery   S/P CABG x 2 06/28/2020: LIMA to LAD, Free Radial graft to D1. LAA ligation with 40 mm Atricure. 06/27/2020   Sleep apnea    Swelling of both lower extremities    Tobacco abuse     PSH:  Past Surgical History:  Procedure Laterality Date   CARDIAC CATHETERIZATION Left 2021   CARDIOVERSION     CARDIOVERSION N/A 04/19/2020   Procedure: CARDIOVERSION;  Surgeon: Ladona Heinz, MD;  Location: [facility] ENDOSCOPY;  Service: Cardiovascular;  Laterality: N/A;   CARDIOVERSION N/A 04/26/2020   Procedure: CARDIOVERSION;  Surgeon: Ladona Heinz, MD;  Location: [facility] ENDOSCOPY;  Service: Cardiovascular;  Laterality: N/A;   CARDIOVERSION N/A 07/01/2020   Procedure: CARDIOVERSION;  Surgeon: Ladona Heinz, MD;  Location: [facility] ENDOSCOPY;  Service: Cardiovascular;  Laterality: N/A;   CLIPPING OF ATRIAL APPENDAGE N/A 06/27/2020   Procedure: CLIPPING OF ATRIAL APPENDAGE USING ATRICURE CLIP SIZE 40;  Surgeon: Army Dallas NOVAK, MD;  Location: [facility] OR;  Service: Open Heart Surgery;  Laterality: N/A;   CORONARY ARTERY BYPASS GRAFT N/A 06/27/2020   Procedure: CORONARY ARTERY BYPASS GRAFTING (CABG) X TWO, USING LEFT INTERNAL MAMMARY ARTERY AND LEFT ARM RADIAL ARTERY;  Surgeon: Army Dallas NOVAK, MD;  Location: [facility] OR;  Service: Open Heart  Surgery;  Laterality: N/A;   FRACTURE SURGERY  1995   pelvis   HIP SURGERY     LAPAROSCOPIC GASTRIC SLEEVE RESECTION N/A 02/14/2015   Procedure: LAPAROSCOPIC GASTRIC SLEEVE RESECTION WITH UPPER ENDOSCOPY;  Surgeon: Donnice Lunger, MD;  Location: [facility] ORS;  Service: General;  Laterality: N/A;   LEFT HEART CATH AND CORONARY ANGIOGRAPHY N/A 04/19/2020   Procedure: LEFT HEART CATH AND CORONARY ANGIOGRAPHY;  Surgeon: Ladona Heinz, MD;  Location: [facility] INVASIVE CV LAB;  Service: Cardiovascular;  Laterality: N/A;   PELVIS CLOSED REDUCTION     RADIAL ARTERY HARVEST Left 06/27/2020   Procedure: LEFT RADIAL  ARTERY HARVEST;  Surgeon: Army Dallas NOVAK, MD;  Location: [facility] OR;  Service: Open Heart Surgery;  Laterality: Left;   TEE WITHOUT CARDIOVERSION N/A 06/27/2020   Procedure: TRANSESOPHAGEAL ECHOCARDIOGRAM (TEE);  Surgeon: Army Dallas NOVAK, MD;  Location: [facility] OR;  Service: Open Heart Surgery;  Laterality: N/A;   TEE WITHOUT CARDIOVERSION N/A 07/01/2020   Procedure: TRANSESOPHAGEAL ECHOCARDIOGRAM (TEE);  Surgeon: Ladona Heinz, MD;  Location: [facility] ENDOSCOPY;  Service: Cardiovascular;  Laterality: N/A;    Social History:  Social History   Socioeconomic History   Marital status: Single    Spouse name: Not on file   Number of children: 0   Years of education: 12   Highest education level: High school graduate  Occupational History   Occupation: Medical Laboratory Scientific Officer  Tobacco Use   Smoking status: Former    Current packs/day: 0.00    Average packs/day: 0.5 packs/day for 30.0 years (15.0 ttl pk-yrs)    Types: Cigarettes    Start date: 04/20/1990    Quit date: 04/20/2020    Years since quitting: 4.4   Smokeless tobacco: Never   Tobacco comments:    half a pack daily  Vaping Use   Vaping status: Every Day   Substances: Nicotine  Substance and Sexual Activity   Alcohol use: Not Currently   Drug use: No   Sexual activity: Not Currently  Other Topics Concern   Not on file  Social History Narrative   ** Merged History Encounter **    Pt lives alone    Pt doesn't work    Social Drivers of Health   Tobacco Use: Medium Risk (05/30/2024)   Patient History    Smoking Tobacco Use: Former    Smokeless Tobacco Use: Never    Passive Exposure: Not on Actuary Strain: Not on file  Food Insecurity: Not on file  Transportation Needs: Not on file  Physical Activity: Not on file  Stress: Not on file  Social Connections: Unknown (02/23/2022)   Received from [facility]   Social Network    Social Network: Not on file  Intimate Partner Violence: Unknown (01/16/2022)    Received from [facility]   HITS    Physically Hurt: Not on file    Insult or Talk Down To: Not on file    Threaten Physical Harm: Not on file    Scream or Curse: Not on file  Depression (EYV7-0): Not on file  Alcohol Screen: Not on file  Housing: Not on file  Utilities: Not on file  Health Literacy: Not on file    Family History:  Family History  Problem Relation Age of Onset   Atrial fibrillation Mother    CAD Mother    Heart disease Mother    Hypertension Mother    Hyperlipidemia Mother    Sleep apnea Mother  CAD Father    Atrial fibrillation Father    Diabetes Father    Hypertension Father    Hyperlipidemia Father    Sleep apnea Father     Medications:   Current Outpatient Medications on File Prior to Visit  Medication Sig Dispense Refill   ALPRAZolam  (XANAX ) 0.5 MG tablet Take 0.5 mg by mouth daily as needed.     amiodarone  (PACERONE ) 200 MG tablet Take 2 tablets (400 mg total) by mouth 2 (two) times daily. 120 tablet 1   aspirin  EC 81 MG tablet Take 1 tablet (81 mg total) by mouth daily. 90 tablet 3   atorvastatin  (LIPITOR ) 80 MG tablet TAKE 1 TABLET (80 MG TOTAL) BY MOUTH DAILY. 90 tablet 1   benazepril  (LOTENSIN ) 10 MG tablet Take 1 tablet (10 mg total) by mouth daily. 90 tablet 3   Cholecalciferol (VITAMIN D3) 75 MCG (3000 UT) TABS Take 1 tablet by mouth daily.     ezetimibe  (ZETIA ) 10 MG tablet Take 1 tablet (10 mg total) by mouth daily. 90 tablet 3   isosorbide  mononitrate (IMDUR ) 30 MG 24 hr tablet Take 0.5 tablets (15 mg total) by mouth daily. 40 tablet 0   metoprolol  tartrate (LOPRESSOR ) 50 MG tablet TAKE 1 TABLET BY MOUTH 2 TIMES DAILY. 180 tablet 1   metoprolol  tartrate (LOPRESSOR ) 50 MG tablet Take 1 tablet (50 mg total) by mouth 2 (two) times daily. 180 tablet 0   RABEprazole (ACIPHEX) 20 MG tablet Take 20 mg by mouth daily.      XARELTO  20 MG TABS tablet Take 1 tablet (20 mg total) by mouth daily with supper. 90 tablet 3   No current  facility-administered medications on file prior to visit.    Allergies:   Allergies  Allergen Reactions   Amoxicillin Nausea And Vomiting   Doxycycline Hyclate Rash   Sulfamethoxazole -Trimethoprim  Hives, Rash and Other (See Comments)    White oral lesions      OBJECTIVE:  Physical Exam  General: well developed, well nourished, pleasant middle-age Caucasian male, seated, in no evident distress  Neurologic Exam Mental Status: Awake and fully alert. Oriented to place and time. Recent and remote memory intact. Attention span, concentration and fund of knowledge appropriate. Mood and affect appropriate.         ASSESSMENT/PLAN: Joseph Lang is a 48 y.o. year old male    OSA on CPAP :  Compliance report shows satisfactory usage with optimal residual AHI.   Continue current pressure setting of 11 with EPR off.   Concerned regarding mask ripping at bridge of nose. Discussed further with sleep lab manager. Will advise patient to look on [employer] at different CPAP liners to see if this helps reinforce material. Can consider change of mask completely if this continues to be an issue.  Discussed continued nightly usage with ensuring greater than 4 hours nightly for optimal benefit and per insurance purposes.   Continue to follow with DME company [employer] for any needed supplies or CPAP related concerns CPAP set up 07/2023     Follow up in 1 year via MyChart video visit or call earlier if needed   CC:  PCP: Ransom Other, MD     Harlene Bogaert, AGNP-BC  [facility] [address] [place], [place] [address]  Phone [phone] Fax [phone] Note: This document was prepared with digital dictation and possible smart phrase technology. Any transcriptional errors that result from this process are unintentional.

## 2024-11-23 ENCOUNTER — Ambulatory Visit: Admitting: Cardiology
